# Patient Record
Sex: Male | Born: 1935 | Race: White | Hispanic: No | Marital: Married | Smoking: Former smoker
Health system: Southern US, Community
[De-identification: ages and names within clinical notes are randomized; demographics above are authoritative.]

## PROBLEM LIST (undated history)

## (undated) ENCOUNTER — Emergency Department (HOSPITAL_COMMUNITY): Admission: EM | Payer: Medicare Other | Source: Home / Self Care

## (undated) DIAGNOSIS — T8859XA Other complications of anesthesia, initial encounter: Secondary | ICD-10-CM

## (undated) DIAGNOSIS — I5020 Unspecified systolic (congestive) heart failure: Secondary | ICD-10-CM

## (undated) DIAGNOSIS — I10 Essential (primary) hypertension: Secondary | ICD-10-CM

## (undated) DIAGNOSIS — E785 Hyperlipidemia, unspecified: Secondary | ICD-10-CM

## (undated) DIAGNOSIS — I4819 Other persistent atrial fibrillation: Secondary | ICD-10-CM

## (undated) DIAGNOSIS — K560 Paralytic ileus: Secondary | ICD-10-CM

## (undated) DIAGNOSIS — T4145XA Adverse effect of unspecified anesthetic, initial encounter: Secondary | ICD-10-CM

## (undated) DIAGNOSIS — I5022 Chronic systolic (congestive) heart failure: Secondary | ICD-10-CM

## (undated) DIAGNOSIS — K219 Gastro-esophageal reflux disease without esophagitis: Secondary | ICD-10-CM

## (undated) DIAGNOSIS — E871 Hypo-osmolality and hyponatremia: Secondary | ICD-10-CM

## (undated) DIAGNOSIS — Z889 Allergy status to unspecified drugs, medicaments and biological substances status: Secondary | ICD-10-CM

## (undated) DIAGNOSIS — N400 Enlarged prostate without lower urinary tract symptoms: Secondary | ICD-10-CM

## (undated) HISTORY — DX: Essential (primary) hypertension: I10

## (undated) HISTORY — DX: Chronic systolic (congestive) heart failure: I50.22

## (undated) HISTORY — DX: Hypo-osmolality and hyponatremia: E87.1

## (undated) HISTORY — PX: UMBILICAL HERNIA REPAIR: SHX196

## (undated) HISTORY — DX: Hyperlipidemia, unspecified: E78.5

## (undated) HISTORY — PX: OTHER SURGICAL HISTORY: SHX169

## (undated) HISTORY — DX: Other persistent atrial fibrillation: I48.19

## (undated) HISTORY — DX: Unspecified systolic (congestive) heart failure: I50.20

## (undated) HISTORY — PX: APPENDECTOMY: SHX54

---

## 2000-08-02 ENCOUNTER — Ambulatory Visit (HOSPITAL_COMMUNITY): Admission: RE | Admit: 2000-08-02 | Discharge: 2000-08-02 | Payer: Self-pay | Admitting: Unknown Physician Specialty

## 2000-08-02 ENCOUNTER — Encounter: Payer: Self-pay | Admitting: Unknown Physician Specialty

## 2006-05-23 ENCOUNTER — Other Ambulatory Visit: Admission: RE | Admit: 2006-05-23 | Discharge: 2006-05-23 | Payer: Self-pay | Admitting: General Surgery

## 2006-05-23 ENCOUNTER — Encounter (INDEPENDENT_AMBULATORY_CARE_PROVIDER_SITE_OTHER): Payer: Self-pay | Admitting: General Surgery

## 2006-07-09 ENCOUNTER — Ambulatory Visit (HOSPITAL_COMMUNITY): Admission: RE | Admit: 2006-07-09 | Discharge: 2006-07-09 | Payer: Self-pay | Admitting: Internal Medicine

## 2006-11-20 ENCOUNTER — Ambulatory Visit: Payer: Self-pay | Admitting: Orthopedic Surgery

## 2006-11-20 DIAGNOSIS — G609 Hereditary and idiopathic neuropathy, unspecified: Secondary | ICD-10-CM | POA: Insufficient documentation

## 2006-11-20 DIAGNOSIS — M549 Dorsalgia, unspecified: Secondary | ICD-10-CM | POA: Insufficient documentation

## 2007-02-12 ENCOUNTER — Ambulatory Visit (HOSPITAL_COMMUNITY): Admission: RE | Admit: 2007-02-12 | Discharge: 2007-02-12 | Payer: Self-pay | Admitting: Orthopedic Surgery

## 2007-02-14 ENCOUNTER — Encounter: Admission: RE | Admit: 2007-02-14 | Discharge: 2007-02-14 | Payer: Self-pay | Admitting: Orthopedic Surgery

## 2007-03-05 ENCOUNTER — Encounter: Admission: RE | Admit: 2007-03-05 | Discharge: 2007-03-05 | Payer: Self-pay | Admitting: Orthopedic Surgery

## 2007-08-07 ENCOUNTER — Other Ambulatory Visit: Admission: RE | Admit: 2007-08-07 | Discharge: 2007-08-07 | Payer: Self-pay | Admitting: General Surgery

## 2007-08-07 ENCOUNTER — Encounter (INDEPENDENT_AMBULATORY_CARE_PROVIDER_SITE_OTHER): Payer: Self-pay | Admitting: General Surgery

## 2008-05-26 ENCOUNTER — Ambulatory Visit (HOSPITAL_COMMUNITY): Admission: RE | Admit: 2008-05-26 | Discharge: 2008-05-26 | Payer: Self-pay | Admitting: Pulmonary Disease

## 2008-06-24 ENCOUNTER — Other Ambulatory Visit: Admission: RE | Admit: 2008-06-24 | Discharge: 2008-06-24 | Payer: Self-pay | Admitting: General Surgery

## 2009-03-15 ENCOUNTER — Other Ambulatory Visit: Admission: RE | Admit: 2009-03-15 | Discharge: 2009-03-15 | Payer: Self-pay | Admitting: General Surgery

## 2009-04-20 ENCOUNTER — Ambulatory Visit (HOSPITAL_COMMUNITY): Admission: RE | Admit: 2009-04-20 | Discharge: 2009-04-20 | Payer: Self-pay | Admitting: Pulmonary Disease

## 2010-05-17 ENCOUNTER — Encounter: Payer: Self-pay | Admitting: Cardiovascular Disease

## 2010-05-17 ENCOUNTER — Ambulatory Visit (INDEPENDENT_AMBULATORY_CARE_PROVIDER_SITE_OTHER): Payer: Medicare Other | Admitting: Cardiovascular Disease

## 2010-05-17 ENCOUNTER — Other Ambulatory Visit: Payer: Self-pay | Admitting: Cardiology

## 2010-05-17 ENCOUNTER — Encounter (INDEPENDENT_AMBULATORY_CARE_PROVIDER_SITE_OTHER): Payer: Medicare Other | Admitting: Cardiology

## 2010-05-17 VITALS — BP 134/60 | HR 45 | Ht 72.0 in | Wt 184.0 lb

## 2010-05-17 DIAGNOSIS — I701 Atherosclerosis of renal artery: Secondary | ICD-10-CM

## 2010-05-17 DIAGNOSIS — I10 Essential (primary) hypertension: Secondary | ICD-10-CM

## 2010-05-17 MED ORDER — HYDRALAZINE HCL 25 MG PO TABS: 25.0000 mg | ORAL_TABLET | Freq: Three times a day (TID) | ORAL | Status: DC

## 2010-05-17 NOTE — Patient Instructions (Signed)
Your physician has recommended you make the following change in your medication: START Hydralazine 25mg  take one tablet by mouth every 8 hours

## 2010-05-18 ENCOUNTER — Encounter: Payer: Self-pay | Admitting: Cardiovascular Disease

## 2010-06-01 ENCOUNTER — Encounter: Payer: Self-pay | Admitting: Cardiovascular Disease

## 2010-06-01 DIAGNOSIS — I10 Essential (primary) hypertension: Secondary | ICD-10-CM | POA: Insufficient documentation

## 2010-06-01 NOTE — Progress Notes (Signed)
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 He has been followed by [name] for the past few years and is being treated with multidrug therapy for hypertension. Despite the use of 4 agents, his BP continues to run above goal. His initial diagnosis of hypertension was after age 77. He has had a renal MRA demonstrating no significant renal artery stenosis. He currently is treated with an ARB, Clonidine, bisoprolol, and amlodipine. He notes clonidine has been effective in lowering his BP, but he would like to lower the dose or discontinue the drug because of dry mouth. He has been on a diuretic in the past and had some elevation of his creatinine with this.   Home BP's generally range in the systolic from [id number], depending on how far out he is from last clonidine dose.   Overall he feels well. 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 He is not engaged in regular physical exercise. He denies chest pain, dyspnea, edema, orthopnea, or PND. He denies palpitations, lightheadedness, or syncope. He has no history of cardiac disease, TIA, or stroke.  Outpatient Encounter Prescriptions as of 05/17/2010  Medication Sig Dispense Refill  . amLODipine (NORVASC) 10 MG tablet Take 10 mg by mouth daily.        Marland Kitchen aspirin 81 MG EC tablet Take 81 mg by mouth daily.        . Azilsartan Medoxomil (EDARBI) 80 MG TABS Take 80 mg by mouth daily.        . cloNIDine (CATAPRES) 0.2 MG tablet Take 0.2 mg by mouth every 8 (eight) hours.        . dutasteride (AVODART) 0.5 MG capsule Take 0.5 mg by mouth daily.        . Nebivolol HCl (BYSTOLIC) 20 MG TABS Take 20 mg by mouth daily.        . niacin (NIASPAN) 500 MG CR tablet Take 500 mg by mouth at bedtime.        . rosuvastatin (CRESTOR) 20 MG tablet Take 20 mg by mouth daily.        . hydrALAZINE (APRESOLINE) 25 MG tablet Take 1 tablet (25 mg total) by mouth 3 (three) times daily.  90 tablet  11    Review of patient's  allergies indicates no known allergies.  Past Medical History  Diagnosis Date  . Hypertension   . Hyperlipidemia     Past Surgical History  Procedure Date  . Appendectomy     approx 30 years ago/lwb  . Umbilical hernia repair     Approx 10 years ago/lwb  . Ureteral stone extraction     History   Social History  . Marital Status: Married    Spouse Name: N/A    Number of Children: N/A  . Years of Education: N/A   Occupational History  . Not on file.   Social History Main Topics  . Smoking status: Former Smoker    Types: Pipe  . Smokeless tobacco: Not on file  . Alcohol Use: Yes     occasional social drink/lwb  . Drug Use: No  . Sexually Active: Not on file   Other Topics Concern  . Not on file   Social History Narrative  . No narrative on file    No family history on file.  ROS: General: no fevers/chills/night sweats Eyes: no blurry vision, diplopia, or amaurosis ENT: no sore throat or hearing loss Resp: no cough, wheezing, or hemoptysis CV: no edema  or palpitations GI: no abdominal pain, nausea, vomiting, diarrhea, or constipation GU: no dysuria, frequency, or hematuria Skin: no rash Neuro: no headache, numbness, tingling, or weakness of extremities Musculoskeletal: no joint pain or swelling Heme: no bleeding, DVT, or easy bruising Endo: no polydipsia or polyuria  BP 134/60  Pulse 45  Ht 6' (1.829 m)  Wt 184 lb (83.462 kg)  BMI 24.95 kg/m2  PHYSICAL EXAM: Pt is alert and oriented, WD, WN, in no distress. HEENT: normal Neck: JVP normal. Carotid upstrokes normal without bruits. No thyromegaly. Lungs: equal expansion, clear bilaterally CV: Apex is discrete and nondisplaced, bradycardic and regular with a grade 2/6 systolic ejection murmur along the left sternal border Abd: soft, NT, +BS, no bruit, no hepatosplenomegaly Back: no CVA tenderness Ext: no C/C/E        Femoral pulses 2+= without bruits        DP/PT pulses intact and = Skin: warm and  dry without rash Neuro: CNII-XII intact             Strength intact = bilaterally  EKG:  Marked sinus bradycardia with first degree AV block, pr 216 ms. No significant ST-T changes  ASSESSMENT AND PLAN:

## 2010-06-01 NOTE — Assessment & Plan Note (Signed)
Dr [name] has essential HTN with suboptimal control on multidrug therapy.  A renal duplex scan was performed and demonstrates patent renal arteries bilaterally without significant stenoses. I don't think workup of other secondary causes of HTN is necessary at this point. We discussed various drug strategies, and decided to give a trial of hydralazine 25 mg three times daily. This could be titrated to 50 mg TID if well-tolerated. Another option would be to consider the addition of chlorthalidone, which comes as a combination pill with edarbi, but the dose would have to be changed to 40-12.5 or 40-25 mg as this is the max dose of the combination drug Edarbyclor.  Unlike HCTZ, chlorthalidone can be used in patient's with renal insufficiency. Another consideration would be to change bisoprolol to a nonselective beta blocker like labetolol which has less of a bradycardic effect. Will plan on adding hydralazine first, then considering other options based on his response.

## 2010-06-02 ENCOUNTER — Encounter (HOSPITAL_COMMUNITY): Payer: Medicare Other

## 2010-06-02 ENCOUNTER — Other Ambulatory Visit: Payer: Self-pay | Admitting: Anesthesiology

## 2010-06-02 LAB — BASIC METABOLIC PANEL
BUN: 33 mg/dL — ABNORMAL HIGH (ref 6–23)
Calcium: 9.7 mg/dL (ref 8.4–10.5)
GFR calc non Af Amer: 42 mL/min — ABNORMAL LOW (ref >60)
Glucose, Bld: 161 mg/dL — ABNORMAL HIGH (ref 70–99)
Potassium: 4.5 mEq/L (ref 3.5–5.1)

## 2010-06-02 LAB — HEMOGLOBIN AND HEMATOCRIT, BLOOD: Hemoglobin: 11.9 g/dL — ABNORMAL LOW (ref 13.0–17.0)

## 2010-06-08 ENCOUNTER — Ambulatory Visit (HOSPITAL_COMMUNITY)
Admission: RE | Admit: 2010-06-08 | Discharge: 2010-06-08 | Disposition: A | Discharge status: Home / Self Care | Payer: Medicare Other | Source: Ambulatory Visit | Attending: Ophthalmology | Admitting: Ophthalmology

## 2010-06-08 DIAGNOSIS — I10 Essential (primary) hypertension: Secondary | ICD-10-CM | POA: Insufficient documentation

## 2010-06-08 DIAGNOSIS — H2589 Other age-related cataract: Secondary | ICD-10-CM | POA: Insufficient documentation

## 2010-06-08 DIAGNOSIS — Z7982 Long term (current) use of aspirin: Secondary | ICD-10-CM | POA: Insufficient documentation

## 2010-06-08 DIAGNOSIS — Z79899 Other long term (current) drug therapy: Secondary | ICD-10-CM | POA: Insufficient documentation

## 2010-06-09 ENCOUNTER — Ambulatory Visit: Payer: Self-pay | Admitting: Cardiovascular Disease

## 2010-06-19 NOTE — Op Note (Signed)
  NAME:  Dominic Hodges, Dominic Hodges            ACCOUNT NO.:  [id number]  MEDICAL RECORD NO.:  [id number]           PATIENT TYPE:  O  LOCATION:  [facility]                          FACILITY:  [facility]  PHYSICIAN:  Richardo Hanks, MD       DATE OF BIRTH:  10/20/1927  DATE OF PROCEDURE:  06/08/2010 DATE OF DISCHARGE:  06/08/2010                              OPERATIVE REPORT   PREOPERATIVE DIAGNOSIS:  Combined cataract right eye, diagnosis code 366.19.  POSTOPERATIVE DIAGNOSIS:  Combined cataract right eye, diagnosis code 366.19.  OPERATION PERFORMED:  Phacoemulsification with posterior chamber intraocular lens implantation, right eye.  SURGEON:  Richardo Hanks, MD  ANESTHESIA:  General endotracheal anesthesia.  OPERATIVE SUMMARY:  In the preoperative area, dilating drops were placed into the right eye.  The patient was then brought into the operating room where she was placed under general anesthesia.  The eye was then prepped and draped.  Beginning with a 75 blade, a paracentesis port was made at the surgeon's 2 o'clock position.  The anterior chamber was then filled with a 1% nonpreserved lidocaine solution with epinephrine.  This was followed by Viscoat to deepen the chamber.  A small fornix-based peritomy was performed superiorly.  Next, a single iris hook was placed through the limbus superiorly.  A 2.4-mm keratome blade was then used to make a clear corneal incision over the iris hook.  A bent cystotome needle and Utrata forceps were used to create a continuous tear capsulotomy.  Hydrodissection was performed using balanced salt solution on a fine cannula.  The lens nucleus was then removed using phacoemulsification in a quadrant cracking technique.  The cortical material was then removed with irrigation and aspiration.  The capsular bag and anterior chamber were refilled with Provisc.  The wound was widened to approximately 3 mm and a posterior chamber intraocular lens was placed into the  capsular bag without difficulty using an [employer] lens injecting system.  A single 10-0 nylon suture was then used to close the incision as well as stromal hydration.  The Provisc was removed from the anterior chamber and capsular bag with irrigation and aspiration.  At this point, the wounds were tested for leak, which were negative.  The anterior chamber remained deep and stable.  The patient tolerated the procedure well.  There were no operative complications, and she awoke from general anesthesia without problem.  PROSTHETIC DEVICE USED:  Alcon ReSTOR posterior chamber lens, model SN6AD1, power of 20.0, serial number is [id number].          ______________________________ Richardo Hanks, MD     KEH/MEDQ  D:  06/08/2010  T:  06/08/2010  Job:  [id number]  Electronically Signed by Tonny Branch MD on 06/19/2010 12:22:10 PM

## 2010-10-04 ENCOUNTER — Other Ambulatory Visit: Payer: Self-pay | Admitting: General Surgery

## 2010-10-04 ENCOUNTER — Other Ambulatory Visit (HOSPITAL_COMMUNITY)
Admission: RE | Admit: 2010-10-04 | Discharge: 2010-10-04 | Disposition: A | Discharge status: Home / Self Care | Payer: Medicare Other | Source: Ambulatory Visit | Attending: General Surgery | Admitting: General Surgery

## 2010-10-04 DIAGNOSIS — D0420 Carcinoma in situ of skin of unspecified ear and external auricular canal: Secondary | ICD-10-CM | POA: Insufficient documentation

## 2011-10-01 ENCOUNTER — Other Ambulatory Visit (HOSPITAL_COMMUNITY)
Admission: RE | Admit: 2011-10-01 | Discharge: 2011-10-01 | Disposition: A | Discharge status: Home / Self Care | Payer: Medicare Other | Source: Ambulatory Visit | Attending: General Surgery | Admitting: General Surgery

## 2011-10-01 DIAGNOSIS — C44621 Squamous cell carcinoma of skin of unspecified upper limb, including shoulder: Secondary | ICD-10-CM | POA: Insufficient documentation

## 2011-10-31 ENCOUNTER — Other Ambulatory Visit (HOSPITAL_COMMUNITY)
Admission: RE | Admit: 2011-10-31 | Discharge: 2011-10-31 | Disposition: A | Discharge status: Home / Self Care | Payer: Medicare Other | Source: Ambulatory Visit | Attending: General Surgery | Admitting: General Surgery

## 2011-10-31 DIAGNOSIS — C44221 Squamous cell carcinoma of skin of unspecified ear and external auricular canal: Secondary | ICD-10-CM | POA: Insufficient documentation

## 2012-09-03 ENCOUNTER — Encounter (HOSPITAL_COMMUNITY): Payer: Self-pay | Admitting: Pharmacy Technician

## 2012-09-09 ENCOUNTER — Other Ambulatory Visit: Payer: Self-pay

## 2012-09-09 ENCOUNTER — Encounter (HOSPITAL_COMMUNITY): Payer: Self-pay

## 2012-09-09 ENCOUNTER — Encounter (HOSPITAL_COMMUNITY)
Admission: RE | Admit: 2012-09-09 | Discharge: 2012-09-09 | Disposition: A | Discharge status: Home / Self Care | Payer: Medicare Other | Source: Ambulatory Visit | Attending: Ophthalmology | Admitting: Ophthalmology

## 2012-09-09 HISTORY — DX: Benign prostatic hyperplasia without lower urinary tract symptoms: N40.0

## 2012-09-09 LAB — BASIC METABOLIC PANEL
CO2: 25 mEq/L (ref 19–32)
Calcium: 9.3 mg/dL (ref 8.4–10.5)
Glucose, Bld: 99 mg/dL (ref 70–99)
Sodium: 138 mEq/L (ref 135–145)

## 2012-09-09 NOTE — Patient Instructions (Signed)
Your procedure is scheduled on:  09/11/12  Report to [facility] at 06:15 AM.  Call this number if you have problems the morning of surgery: [phone]   Remember:   Do not eat or drink:After Midnight.  Take these medicines the morning of surgery with A SIP OF WATER: Edarbi, Clonidine, Hydralazine and Bystolic.   Do not wear jewelry, make-up or nail polish.  Do not wear lotions, powders, or perfumes. You may wear deodorant.  Do not shave 48 hours prior to surgery. Men may shave face and neck.  Do not bring valuables to the hospital.  Contacts, dentures or bridgework may not be worn into surgery.  Leave suitcase in the car. After surgery it may be brought to your room.  For patients admitted to the hospital, checkout time is 11:00 AM the day of discharge.   Patients discharged the day of surgery will not be allowed to drive home.    Special Instructions: Start using your eye drops before surgery as directed by your eye doctor.   Please read over the following fact sheets that you were given: Anesthesia Post-op Instructions    Cataract Surgery  A cataract is a clouding of the lens of the eye. When a lens becomes cloudy, vision is reduced based on the degree and nature of the clouding. Surgery may be needed to improve vision. Surgery removes the cloudy lens and usually replaces it with a substitute lens (intraocular lens, IOL). LET YOUR EYE DOCTOR KNOW ABOUT:  Allergies to food or medicine.  Medicines taken including herbs, eyedrops, over-the-counter medicines, and creams.  Use of steroids (by mouth or creams).  Previous problems with anesthetics or numbing medicine.  History of bleeding problems or blood clots.  Previous surgery.  Other health problems, including diabetes and kidney problems.  Possibility of pregnancy, if this applies. RISKS AND COMPLICATIONS  Infection.  Inflammation of the eyeball (endophthalmitis) that can spread to both eyes (sympathetic  ophthalmia).  Poor wound healing.  If an IOL is inserted, it can later fall out of proper position. This is very uncommon.  Clouding of the part of your eye that holds an IOL in place. This is called an "after-cataract." These are uncommon, but easily treated. BEFORE THE PROCEDURE  Do not eat or drink anything except small amounts of water for 8 to 12 before your surgery, or as directed by your caregiver.  Unless you are told otherwise, continue any eyedrops you have been prescribed.  Talk to your primary caregiver about all other medicines that you take (both prescription and non-prescription). In some cases, you may need to stop or change medicines near the time of your surgery. This is most important if you are taking blood-thinning medicine.Do not stop medicines unless you are told to do so.  Arrange for someone to drive you to and from the procedure.  Do not put contact lenses in either eye on the day of your surgery. PROCEDURE There is more than one method for safely removing a cataract. Your doctor can explain the differences and help determine which is best for you. Phacoemulsification surgery is the most common form of cataract surgery.  An injection is given behind the eye or eyedrops are given to make this a painless procedure.  A small cut (incision) is made on the edge of the clear, dome-shaped surface that covers the front of the eye (cornea).  A tiny probe is painlessly inserted into the eye. This device gives off ultrasound waves that soften and  break up the cloudy center of the lens. This makes it easier for the cloudy lens to be removed by suction.  An IOL may be implanted.  The normal lens of the eye is covered by a clear capsule. Part of that capsule is intentionally left in the eye to support the IOL.  Your surgeon may or may not use stitches to close the incision. There are other forms of cataract surgery that require a larger incision and stiches to close the  eye. This approach is taken in cases where the doctor feels that the cataract cannot be easily removed using phacoemulsification. AFTER THE PROCEDURE  When an IOL is implanted, it does not need care. It becomes a permanent part of your eye and cannot be seen or felt.  Your doctor will schedule follow-up exams to check on your progress.  Review your other medicines with your doctor to see which can be resumed after surgery.  Use eyedrops or take medicine as prescribed by your doctor. Document Released: 12/14/2010 Document Revised: 03/19/2011 Document Reviewed: 12/14/2010 [facility] Patient Information 2013 [place].    PATIENT INSTRUCTIONS POST-ANESTHESIA  IMMEDIATELY FOLLOWING SURGERY:  Do not drive or operate machinery for the first twenty four hours after surgery.  Do not make any important decisions for twenty four hours after surgery or while taking narcotic pain medications or sedatives.  If you develop intractable nausea and vomiting or a severe headache please notify your doctor immediately.  FOLLOW-UP:  Please make an appointment with your surgeon as instructed. You do not need to follow up with anesthesia unless specifically instructed to do so.  WOUND CARE INSTRUCTIONS (if applicable):  Keep a dry clean dressing on the anesthesia/puncture wound site if there is drainage.  Once the wound has quit draining you may leave it open to air.  Generally you should leave the bandage intact for twenty four hours unless there is drainage.  If the epidural site drains for more than 36-48 hours please call the anesthesia department.  QUESTIONS?:  Please feel free to call your physician or the hospital operator if you have any questions, and they will be happy to assist you.

## 2012-09-10 MED ORDER — LIDOCAINE HCL 3.5 % OP GEL
OPHTHALMIC | Status: AC
  Filled 2012-09-10: qty 5

## 2012-09-10 MED ORDER — NEOMYCIN-POLYMYXIN-DEXAMETH 3.5-10000-0.1 OP OINT
TOPICAL_OINTMENT | OPHTHALMIC | Status: AC
  Filled 2012-09-10: qty 3.5

## 2012-09-10 MED ORDER — CYCLOPENTOLATE-PHENYLEPHRINE OP SOLN OPTIME - NO CHARGE
OPHTHALMIC | Status: AC
  Filled 2012-09-10: qty 2

## 2012-09-10 MED ORDER — PHENYLEPHRINE HCL 2.5 % OP SOLN
OPHTHALMIC | Status: AC
  Filled 2012-09-10: qty 15

## 2012-09-10 MED ORDER — LIDOCAINE HCL (PF) 1 % IJ SOLN
INTRAMUSCULAR | Status: AC
  Filled 2012-09-10: qty 2

## 2012-09-10 MED ORDER — TETRACAINE HCL 0.5 % OP SOLN
OPHTHALMIC | Status: AC
  Filled 2012-09-10: qty 2

## 2012-09-11 ENCOUNTER — Encounter (HOSPITAL_COMMUNITY): Payer: Self-pay | Admitting: *Deleted

## 2012-09-11 ENCOUNTER — Ambulatory Visit (HOSPITAL_COMMUNITY)
Admission: RE | Admit: 2012-09-11 | Discharge: 2012-09-11 | Disposition: A | Discharge status: Home / Self Care | Payer: Medicare Other | Source: Ambulatory Visit | Attending: Ophthalmology | Admitting: Ophthalmology

## 2012-09-11 ENCOUNTER — Ambulatory Visit (HOSPITAL_COMMUNITY): Payer: Medicare Other | Admitting: Anesthesiology

## 2012-09-11 ENCOUNTER — Encounter (HOSPITAL_COMMUNITY): Payer: Self-pay | Admitting: Anesthesiology

## 2012-09-11 ENCOUNTER — Encounter (HOSPITAL_COMMUNITY)
Admission: RE | Disposition: A | Discharge status: Home / Self Care | Payer: Self-pay | Source: Ambulatory Visit | Attending: Ophthalmology

## 2012-09-11 DIAGNOSIS — Z01810 Encounter for preprocedural cardiovascular examination: Secondary | ICD-10-CM | POA: Insufficient documentation

## 2012-09-11 DIAGNOSIS — Z01812 Encounter for preprocedural laboratory examination: Secondary | ICD-10-CM | POA: Insufficient documentation

## 2012-09-11 DIAGNOSIS — I10 Essential (primary) hypertension: Secondary | ICD-10-CM | POA: Insufficient documentation

## 2012-09-11 DIAGNOSIS — H2589 Other age-related cataract: Secondary | ICD-10-CM | POA: Insufficient documentation

## 2012-09-11 DIAGNOSIS — H52209 Unspecified astigmatism, unspecified eye: Secondary | ICD-10-CM | POA: Insufficient documentation

## 2012-09-11 HISTORY — PX: CATARACT EXTRACTION W/PHACO: SHX586

## 2012-09-11 SURGERY — PHACOEMULSIFICATION, CATARACT, WITH IOL INSERTION
Anesthesia: Monitor Anesthesia Care | Site: Eye | Laterality: Left | Wound class: Clean

## 2012-09-11 MED ORDER — MIDAZOLAM HCL 2 MG/2ML IJ SOLN
INTRAMUSCULAR | Status: AC
  Filled 2012-09-11: qty 2

## 2012-09-11 MED ORDER — BSS IO SOLN
INTRAOCULAR | Status: DC | PRN
  Administered 2012-09-11: 15 mL via INTRAOCULAR

## 2012-09-11 MED ORDER — EPINEPHRINE HCL 1 MG/ML IJ SOLN
INTRAMUSCULAR | Status: AC
  Filled 2012-09-11: qty 1

## 2012-09-11 MED ORDER — LACTATED RINGERS IV SOLN
INTRAVENOUS | Status: DC
  Administered 2012-09-11: 07:00:00 via INTRAVENOUS

## 2012-09-11 MED ORDER — TETRACAINE HCL 0.5 % OP SOLN
1.0000 [drp] | OPHTHALMIC | Status: AC
  Administered 2012-09-11 (×3): 1 [drp] via OPHTHALMIC

## 2012-09-11 MED ORDER — PROVISC 10 MG/ML IO SOLN
INTRAOCULAR | Status: DC | PRN
  Administered 2012-09-11: 8.5 mg via INTRAOCULAR

## 2012-09-11 MED ORDER — MIDAZOLAM HCL 2 MG/2ML IJ SOLN
1.0000 mg | INTRAMUSCULAR | Status: DC | PRN
  Administered 2012-09-11: 2 mg via INTRAVENOUS

## 2012-09-11 MED ORDER — POVIDONE-IODINE 5 % OP SOLN
OPHTHALMIC | Status: DC | PRN
  Administered 2012-09-11: 1 via OPHTHALMIC

## 2012-09-11 MED ORDER — LIDOCAINE HCL 3.5 % OP GEL: 1.0000 "application " | Freq: Once | OPHTHALMIC | Status: DC

## 2012-09-11 MED ORDER — FENTANYL CITRATE 0.05 MG/ML IJ SOLN
INTRAMUSCULAR | Status: AC
  Filled 2012-09-11: qty 2

## 2012-09-11 MED ORDER — LIDOCAINE HCL (PF) 1 % IJ SOLN
INTRAOCULAR | Status: DC | PRN
  Administered 2012-09-11: 08:00:00 via OPHTHALMIC

## 2012-09-11 MED ORDER — FENTANYL CITRATE 0.05 MG/ML IJ SOLN
25.0000 ug | INTRAMUSCULAR | Status: AC
  Administered 2012-09-11: 25 ug via INTRAVENOUS

## 2012-09-11 MED ORDER — NEOMYCIN-POLYMYXIN-DEXAMETH 0.1 % OP OINT
TOPICAL_OINTMENT | OPHTHALMIC | Status: DC | PRN
  Administered 2012-09-11: 1 via OPHTHALMIC

## 2012-09-11 MED ORDER — PHENYLEPHRINE HCL 2.5 % OP SOLN
1.0000 [drp] | OPHTHALMIC | Status: AC
Start: 2012-09-11 — End: 2012-09-11
  Administered 2012-09-11 (×3): 1 [drp] via OPHTHALMIC

## 2012-09-11 MED ORDER — LIDOCAINE 3.5 % OP GEL OPTIME - NO CHARGE
OPHTHALMIC | Status: DC | PRN
  Administered 2012-09-11: 1 [drp] via OPHTHALMIC

## 2012-09-11 MED ORDER — EPINEPHRINE HCL 1 MG/ML IJ SOLN
INTRAOCULAR | Status: DC | PRN
  Administered 2012-09-11: 08:00:00

## 2012-09-11 MED ORDER — CYCLOPENTOLATE-PHENYLEPHRINE 0.2-1 % OP SOLN
1.0000 [drp] | OPHTHALMIC | Status: AC
  Administered 2012-09-11 (×3): 1 [drp] via OPHTHALMIC

## 2012-09-11 SURGICAL SUPPLY — 33 items
ATOMIC EDGE ACCURATE DEPTH KNIFE ×1 IMPLANT
CAPSULAR TENSION RING-AMO (OPHTHALMIC RELATED) IMPLANT
CLOTH BEACON ORANGE TIMEOUT ST (SAFETY) ×1 IMPLANT
EYE SHIELD UNIVERSAL CLEAR (GAUZE/BANDAGES/DRESSINGS) ×1 IMPLANT
GLOVE BIO SURGEON STRL SZ 6.5 (GLOVE) IMPLANT
GLOVE BIOGEL PI IND STRL 6.5 (GLOVE) IMPLANT
GLOVE BIOGEL PI IND STRL 7.0 (GLOVE) IMPLANT
GLOVE BIOGEL PI IND STRL 7.5 (GLOVE) IMPLANT
GLOVE BIOGEL PI INDICATOR 6.5 (GLOVE) ×2
GLOVE BIOGEL PI INDICATOR 7.0 (GLOVE)
GLOVE BIOGEL PI INDICATOR 7.5 (GLOVE)
GLOVE ECLIPSE 6.5 STRL STRAW (GLOVE) IMPLANT
GLOVE ECLIPSE 7.0 STRL STRAW (GLOVE) IMPLANT
GLOVE ECLIPSE 7.5 STRL STRAW (GLOVE) IMPLANT
GLOVE EXAM NITRILE LRG STRL (GLOVE) IMPLANT
GLOVE EXAM NITRILE MD LF STRL (GLOVE) IMPLANT
GLOVE SKINSENSE NS SZ6.5 (GLOVE)
GLOVE SKINSENSE NS SZ7.0 (GLOVE)
GLOVE SKINSENSE STRL SZ6.5 (GLOVE) IMPLANT
GLOVE SKINSENSE STRL SZ7.0 (GLOVE) IMPLANT
KIT VITRECTOMY (OPHTHALMIC RELATED) IMPLANT
LENS INTRAOCULAR RESTOR ×1 IMPLANT
PAD ARMBOARD 7.5X6 YLW CONV (MISCELLANEOUS) ×1 IMPLANT
PROC W NO LENS (INTRAOCULAR LENS)
PROC W SPEC LENS (INTRAOCULAR LENS) ×2
PROCESS W NO LENS (INTRAOCULAR LENS) IMPLANT
PROCESS W SPEC LENS (INTRAOCULAR LENS) IMPLANT
RING MALYGIN (MISCELLANEOUS) IMPLANT
SYR TB 1ML LL NO SAFETY (SYRINGE) ×1 IMPLANT
TAPE SURG TRANSPORE 1 IN (GAUZE/BANDAGES/DRESSINGS) IMPLANT
TAPE SURGICAL TRANSPORE 1 IN (GAUZE/BANDAGES/DRESSINGS) ×1
VISCOELASTIC ADDITIONAL (OPHTHALMIC RELATED) IMPLANT
WATER STERILE IRR 250ML POUR (IV SOLUTION) ×1 IMPLANT

## 2012-09-11 NOTE — Op Note (Signed)
Date of Admission: 09/11/2012  Date of Surgery: 09/11/2012  Pre-Op Dx: Cataract, Astigmatism, Left  Eye  Post-Op Dx: Cataract  Left  Eye,  Dx Code 366.19, Astigmatism Left Eye, Dx Code [id number]  Surgeon: Tonny Branch, M.D.  Assistants: None  Anesthesia: Topical with MAC  Indications: Painless, progressive loss of vision with compromise of daily activities.  Surgery: Cataract Extraction with Intraocular lens Implant, Limbal relaxing Incision, Left Eye  Discription: The patient had dilating drops and viscous lidocaine placed into the left eye in the pre-op holding area. After transfer to the operating room, a time out was performed. The patient was then prepped and draped. Beginning with a 32 degree blade a paracentesis port was made at the surgeon's 2 o'clock position. The anterior chamber was then filled with 1% non-preserved lidocaine. This was followed by filling the anterior chamber with Provisc. A 2.69mm keratome blade was used to make a clear corneal incision at the temporal limbus. A bent cystatome needle was used to create a continuous tear capsulotomy. Hydrodissection was performed with balanced salt solution on a Fine canula. The lens nucleus was then removed using the phacoemulsification handpiece. Residual cortex was removed with the I&A handpiece. The anterior chamber and capsular bag were refilled with Provisc. A posterior chamber intraocular lens was placed into the capsular bag with it's injector. The implant was positioned with the Kuglan hook. Marks for the LRI were placed on the 113 and 189 degree meridians. A 550um depth knife was used to make the LRI. The Provisc was then removed from the anterior chamber and capsular bag with the I&A handpiece. Stromal hydration of the main incision and paracentesis port was performed with BSS on a Fine canula. The wounds were tested for leak which was negative. The patient tolerated the procedure well. There were no operative complications. The patient  was then transferred to the recovery room in stable condition.  Complications: None  Specimen: None  EBL: None  Prosthetic device: Alcon AcrySof ReStor, SN6AD1, power 20.5, SN [id number].

## 2012-09-11 NOTE — H&P (Signed)
I have reviewed the H&P, the patient was re-examined, and I have identified no interval changes in medical condition and plan of care since the history and physical of record  

## 2012-09-11 NOTE — Anesthesia Preprocedure Evaluation (Signed)
Anesthesia Evaluation  Patient identified by MRN, date of birth, ID band Patient awake    Reviewed: Allergy & Precautions, H&P , NPO status , Patient's Chart, lab work & pertinent test results, reviewed documented beta blocker date and time   History of Anesthesia Complications Negative for: history of anesthetic complications  Airway Mallampati: II TM Distance: >3 FB     Dental  (+) Teeth Intact and Implants   Pulmonary neg pulmonary ROS,  breath sounds clear to auscultation        Cardiovascular hypertension, Pt. on medications Rhythm:Regular Rate:Normal     Neuro/Psych  Neuromuscular disease    GI/Hepatic negative GI ROS,   Endo/Other    Renal/GU      Musculoskeletal   Abdominal   Peds  Hematology   Anesthesia Other Findings   Reproductive/Obstetrics                           Anesthesia Physical Anesthesia Plan  ASA: II  Anesthesia Plan: MAC   Post-op Pain Management:    Induction:   Airway Management Planned: Nasal Cannula  Additional Equipment:   Intra-op Plan:   Post-operative Plan:   Informed Consent: I have reviewed the patients History and Physical, chart, labs and discussed the procedure including the risks, benefits and alternatives for the proposed anesthesia with the patient or authorized representative who has indicated his/her understanding and acceptance.     Plan Discussed with:   Anesthesia Plan Comments:         Anesthesia Quick Evaluation

## 2012-09-11 NOTE — Transfer of Care (Signed)
Immediate Anesthesia Transfer of Care Note  Patient: Dominic Hodges  Procedure(s) Performed: Procedure(s) with comments: CATARACT EXTRACTION PHACO AND INTRAOCULAR LENS PLACEMENT (IOC) (Left) - CDE: 19.52  Patient Location: PACU and Short Stay  Anesthesia Type:MAC  Level of Consciousness: awake  Airway & Oxygen Therapy: Patient Spontanous Breathing  Post-op Assessment: Report given to PACU RN  Post vital signs: Reviewed  Complications: No apparent anesthesia complications

## 2012-09-11 NOTE — Anesthesia Postprocedure Evaluation (Signed)
  Anesthesia Post-op Note  Patient: Dominic Hodges  Procedure(s) Performed: Procedure(s) with comments: CATARACT EXTRACTION PHACO AND INTRAOCULAR LENS PLACEMENT (IOC) (Left) - CDE: 19.52  Patient Location: PACU and Short Stay  Anesthesia Type:MAC  Level of Consciousness: awake, alert  and oriented  Airway and Oxygen Therapy: Patient Spontanous Breathing  Post-op Pain: none  Post-op Assessment: Post-op Vital signs reviewed, Patient's Cardiovascular Status Stable, Respiratory Function Stable, Patent Airway and No signs of Nausea or vomiting  Post-op Vital Signs: Reviewed and stable  Complications: No apparent anesthesia complications

## 2012-09-12 ENCOUNTER — Encounter (HOSPITAL_COMMUNITY): Payer: Self-pay | Admitting: Ophthalmology

## 2013-01-29 ENCOUNTER — Other Ambulatory Visit (HOSPITAL_COMMUNITY): Payer: Self-pay | Admitting: Pulmonary Disease

## 2013-01-29 DIAGNOSIS — I714 Abdominal aortic aneurysm, without rupture, unspecified: Secondary | ICD-10-CM

## 2013-01-29 DIAGNOSIS — Z136 Encounter for screening for cardiovascular disorders: Secondary | ICD-10-CM

## 2013-01-29 DIAGNOSIS — I10 Essential (primary) hypertension: Secondary | ICD-10-CM

## 2013-01-29 DIAGNOSIS — Z139 Encounter for screening, unspecified: Secondary | ICD-10-CM

## 2013-01-31 DIAGNOSIS — R6889 Other general symptoms and signs: Secondary | ICD-10-CM | POA: Diagnosis not present

## 2013-01-31 DIAGNOSIS — Z79899 Other long term (current) drug therapy: Secondary | ICD-10-CM | POA: Diagnosis not present

## 2013-01-31 DIAGNOSIS — IMO0001 Reserved for inherently not codable concepts without codable children: Secondary | ICD-10-CM | POA: Diagnosis not present

## 2013-01-31 DIAGNOSIS — I10 Essential (primary) hypertension: Secondary | ICD-10-CM | POA: Diagnosis not present

## 2013-01-31 DIAGNOSIS — Z0000 Encounter for general adult medical examination without abnormal findings: Secondary | ICD-10-CM | POA: Diagnosis not present

## 2013-01-31 DIAGNOSIS — E785 Hyperlipidemia, unspecified: Secondary | ICD-10-CM | POA: Diagnosis not present

## 2013-01-31 DIAGNOSIS — Z125 Encounter for screening for malignant neoplasm of prostate: Secondary | ICD-10-CM | POA: Diagnosis not present

## 2013-02-04 ENCOUNTER — Ambulatory Visit (HOSPITAL_COMMUNITY): Payer: Medicare Other

## 2013-02-04 ENCOUNTER — Other Ambulatory Visit (HOSPITAL_COMMUNITY): Payer: Self-pay | Admitting: Pulmonary Disease

## 2013-02-04 ENCOUNTER — Ambulatory Visit (HOSPITAL_COMMUNITY)
Admission: RE | Admit: 2013-02-04 | Discharge: 2013-02-04 | Disposition: A | Discharge status: Home / Self Care | Payer: Medicare Other | Source: Ambulatory Visit | Attending: Pulmonary Disease | Admitting: Pulmonary Disease

## 2013-02-04 DIAGNOSIS — I714 Abdominal aortic aneurysm, without rupture, unspecified: Secondary | ICD-10-CM | POA: Diagnosis not present

## 2013-02-04 DIAGNOSIS — Z87891 Personal history of nicotine dependence: Secondary | ICD-10-CM | POA: Insufficient documentation

## 2013-02-04 DIAGNOSIS — I10 Essential (primary) hypertension: Secondary | ICD-10-CM

## 2013-02-04 DIAGNOSIS — I6529 Occlusion and stenosis of unspecified carotid artery: Secondary | ICD-10-CM | POA: Insufficient documentation

## 2013-02-04 DIAGNOSIS — I658 Occlusion and stenosis of other precerebral arteries: Secondary | ICD-10-CM | POA: Diagnosis not present

## 2013-04-30 ENCOUNTER — Other Ambulatory Visit (HOSPITAL_COMMUNITY): Payer: Self-pay | Admitting: Pulmonary Disease

## 2013-04-30 ENCOUNTER — Ambulatory Visit (HOSPITAL_COMMUNITY)
Admission: RE | Admit: 2013-04-30 | Discharge: 2013-04-30 | Disposition: A | Discharge status: Home / Self Care | Payer: Medicare Other | Source: Ambulatory Visit | Attending: Pulmonary Disease | Admitting: Pulmonary Disease

## 2013-04-30 DIAGNOSIS — M25559 Pain in unspecified hip: Secondary | ICD-10-CM | POA: Diagnosis not present

## 2013-05-27 DIAGNOSIS — L600 Ingrowing nail: Secondary | ICD-10-CM | POA: Diagnosis not present

## 2013-06-24 ENCOUNTER — Other Ambulatory Visit (HOSPITAL_COMMUNITY)
Admission: RE | Admit: 2013-06-24 | Discharge: 2013-06-24 | Disposition: A | Discharge status: Home / Self Care | Payer: Medicare Other | Source: Ambulatory Visit | Attending: General Surgery | Admitting: General Surgery

## 2013-06-24 DIAGNOSIS — L723 Sebaceous cyst: Secondary | ICD-10-CM | POA: Insufficient documentation

## 2013-06-24 DIAGNOSIS — K6289 Other specified diseases of anus and rectum: Secondary | ICD-10-CM | POA: Diagnosis not present

## 2013-06-24 DIAGNOSIS — L089 Local infection of the skin and subcutaneous tissue, unspecified: Secondary | ICD-10-CM | POA: Diagnosis not present

## 2013-08-17 ENCOUNTER — Other Ambulatory Visit (HOSPITAL_COMMUNITY): Payer: Self-pay | Admitting: Pulmonary Disease

## 2013-08-17 ENCOUNTER — Ambulatory Visit (HOSPITAL_COMMUNITY)
Admission: RE | Admit: 2013-08-17 | Discharge: 2013-08-17 | Disposition: A | Discharge status: Home / Self Care | Payer: Medicare Other | Source: Ambulatory Visit | Attending: Pulmonary Disease | Admitting: Pulmonary Disease

## 2013-08-17 DIAGNOSIS — M25562 Pain in left knee: Secondary | ICD-10-CM

## 2013-08-17 DIAGNOSIS — M25569 Pain in unspecified knee: Secondary | ICD-10-CM | POA: Insufficient documentation

## 2013-08-18 ENCOUNTER — Ambulatory Visit (HOSPITAL_COMMUNITY)
Admission: RE | Admit: 2013-08-18 | Discharge: 2013-08-18 | Disposition: A | Discharge status: Home / Self Care | Payer: Medicare Other | Source: Ambulatory Visit | Attending: Pulmonary Disease | Admitting: Pulmonary Disease

## 2013-08-18 ENCOUNTER — Other Ambulatory Visit (HOSPITAL_COMMUNITY): Payer: Self-pay | Admitting: Pulmonary Disease

## 2013-08-18 DIAGNOSIS — S83289A Other tear of lateral meniscus, current injury, unspecified knee, initial encounter: Secondary | ICD-10-CM | POA: Diagnosis not present

## 2013-08-18 DIAGNOSIS — M2240 Chondromalacia patellae, unspecified knee: Secondary | ICD-10-CM | POA: Diagnosis not present

## 2013-08-18 DIAGNOSIS — M25469 Effusion, unspecified knee: Secondary | ICD-10-CM | POA: Diagnosis not present

## 2013-08-18 DIAGNOSIS — IMO0002 Reserved for concepts with insufficient information to code with codable children: Secondary | ICD-10-CM | POA: Diagnosis not present

## 2013-08-18 DIAGNOSIS — M25562 Pain in left knee: Secondary | ICD-10-CM

## 2013-08-18 DIAGNOSIS — X58XXXA Exposure to other specified factors, initial encounter: Secondary | ICD-10-CM | POA: Diagnosis not present

## 2013-08-18 DIAGNOSIS — M25569 Pain in unspecified knee: Secondary | ICD-10-CM | POA: Insufficient documentation

## 2013-08-18 DIAGNOSIS — M23359 Other meniscus derangements, posterior horn of lateral meniscus, unspecified knee: Secondary | ICD-10-CM | POA: Diagnosis not present

## 2013-08-18 DIAGNOSIS — M23329 Other meniscus derangements, posterior horn of medial meniscus, unspecified knee: Secondary | ICD-10-CM | POA: Diagnosis not present

## 2013-08-20 ENCOUNTER — Other Ambulatory Visit (HOSPITAL_COMMUNITY): Payer: Medicare Other

## 2014-02-03 DIAGNOSIS — L989 Disorder of the skin and subcutaneous tissue, unspecified: Secondary | ICD-10-CM | POA: Diagnosis not present

## 2014-06-28 ENCOUNTER — Telehealth (INDEPENDENT_AMBULATORY_CARE_PROVIDER_SITE_OTHER): Payer: Self-pay | Admitting: Internal Medicine

## 2014-06-28 MED ORDER — SUCRALFATE 1 GM/10ML PO SUSP: 1.0000 g | Freq: Four times a day (QID) | ORAL | Status: DC

## 2014-06-28 MED ORDER — PANTOPRAZOLE SODIUM 40 MG PO TBEC: 40.0000 mg | DELAYED_RELEASE_TABLET | Freq: Every day | ORAL | Status: DC

## 2014-06-28 NOTE — Telephone Encounter (Signed)
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 He has developed pill esophagitis. Will treat him with sucralfate 1 g by mouth before meals and daily at bedtime for 2 weeks and pantoprazole 40 mg by mouth daily. If he does not feel better by end of week would consider EGD.

## 2014-07-17 ENCOUNTER — Ambulatory Visit (HOSPITAL_COMMUNITY)
Admission: RE | Admit: 2014-07-17 | Discharge: 2014-07-17 | Disposition: A | Discharge status: Home / Self Care | Payer: Medicare Other | Source: Ambulatory Visit | Attending: Pulmonary Disease | Admitting: Pulmonary Disease

## 2014-07-17 ENCOUNTER — Other Ambulatory Visit (HOSPITAL_COMMUNITY): Payer: Self-pay | Admitting: Pulmonary Disease

## 2014-07-17 DIAGNOSIS — R05 Cough: Secondary | ICD-10-CM | POA: Diagnosis not present

## 2014-07-17 DIAGNOSIS — R059 Cough, unspecified: Secondary | ICD-10-CM

## 2014-07-20 DIAGNOSIS — R634 Abnormal weight loss: Secondary | ICD-10-CM | POA: Diagnosis not present

## 2014-07-20 DIAGNOSIS — I10 Essential (primary) hypertension: Secondary | ICD-10-CM | POA: Diagnosis not present

## 2014-07-20 DIAGNOSIS — E785 Hyperlipidemia, unspecified: Secondary | ICD-10-CM | POA: Diagnosis not present

## 2014-07-20 DIAGNOSIS — N401 Enlarged prostate with lower urinary tract symptoms: Secondary | ICD-10-CM | POA: Diagnosis not present

## 2014-07-21 DIAGNOSIS — E785 Hyperlipidemia, unspecified: Secondary | ICD-10-CM | POA: Diagnosis not present

## 2014-07-21 DIAGNOSIS — N401 Enlarged prostate with lower urinary tract symptoms: Secondary | ICD-10-CM | POA: Diagnosis not present

## 2014-07-21 DIAGNOSIS — R7309 Other abnormal glucose: Secondary | ICD-10-CM | POA: Diagnosis not present

## 2014-07-21 DIAGNOSIS — I10 Essential (primary) hypertension: Secondary | ICD-10-CM | POA: Diagnosis not present

## 2014-07-21 DIAGNOSIS — R634 Abnormal weight loss: Secondary | ICD-10-CM | POA: Diagnosis not present

## 2014-08-04 DIAGNOSIS — L989 Disorder of the skin and subcutaneous tissue, unspecified: Secondary | ICD-10-CM | POA: Diagnosis not present

## 2014-08-04 DIAGNOSIS — Z85828 Personal history of other malignant neoplasm of skin: Secondary | ICD-10-CM | POA: Diagnosis not present

## 2014-08-04 DIAGNOSIS — D2330 Other benign neoplasm of skin of unspecified part of face: Secondary | ICD-10-CM | POA: Diagnosis not present

## 2014-08-18 ENCOUNTER — Other Ambulatory Visit (HOSPITAL_COMMUNITY)
Admission: RE | Admit: 2014-08-18 | Discharge: 2014-08-18 | Disposition: A | Discharge status: Home / Self Care | Payer: Medicare Other | Source: Ambulatory Visit | Attending: General Surgery | Admitting: General Surgery

## 2014-08-18 DIAGNOSIS — Z029 Encounter for administrative examinations, unspecified: Secondary | ICD-10-CM | POA: Insufficient documentation

## 2014-08-18 DIAGNOSIS — C44629 Squamous cell carcinoma of skin of left upper limb, including shoulder: Secondary | ICD-10-CM | POA: Diagnosis not present

## 2014-08-18 DIAGNOSIS — Z85828 Personal history of other malignant neoplasm of skin: Secondary | ICD-10-CM | POA: Diagnosis not present

## 2014-08-18 DIAGNOSIS — C44729 Squamous cell carcinoma of skin of left lower limb, including hip: Secondary | ICD-10-CM | POA: Diagnosis not present

## 2014-08-31 DIAGNOSIS — Z85828 Personal history of other malignant neoplasm of skin: Secondary | ICD-10-CM | POA: Diagnosis not present

## 2014-08-31 DIAGNOSIS — C44729 Squamous cell carcinoma of skin of left lower limb, including hip: Secondary | ICD-10-CM | POA: Diagnosis not present

## 2014-12-05 ENCOUNTER — Inpatient Hospital Stay (HOSPITAL_COMMUNITY)
Admission: EM | Admit: 2014-12-05 | Discharge: 2014-12-09 | DRG: 470 | Disposition: A | Discharge status: Skilled Nursing Facility | Payer: Medicare Other | Attending: Pulmonary Disease | Admitting: Pulmonary Disease

## 2014-12-05 ENCOUNTER — Emergency Department (HOSPITAL_COMMUNITY): Payer: Medicare Other

## 2014-12-05 ENCOUNTER — Encounter (HOSPITAL_COMMUNITY): Payer: Self-pay | Admitting: Cardiology

## 2014-12-05 DIAGNOSIS — W010XXA Fall on same level from slipping, tripping and stumbling without subsequent striking against object, initial encounter: Secondary | ICD-10-CM | POA: Diagnosis present

## 2014-12-05 DIAGNOSIS — I10 Essential (primary) hypertension: Secondary | ICD-10-CM | POA: Diagnosis not present

## 2014-12-05 DIAGNOSIS — S72011A Unspecified intracapsular fracture of right femur, initial encounter for closed fracture: Secondary | ICD-10-CM | POA: Diagnosis present

## 2014-12-05 DIAGNOSIS — M25551 Pain in right hip: Secondary | ICD-10-CM | POA: Diagnosis not present

## 2014-12-05 DIAGNOSIS — Z87891 Personal history of nicotine dependence: Secondary | ICD-10-CM | POA: Diagnosis not present

## 2014-12-05 DIAGNOSIS — T148 Other injury of unspecified body region: Secondary | ICD-10-CM | POA: Diagnosis not present

## 2014-12-05 DIAGNOSIS — Z8249 Family history of ischemic heart disease and other diseases of the circulatory system: Secondary | ICD-10-CM | POA: Diagnosis not present

## 2014-12-05 DIAGNOSIS — S72001A Fracture of unspecified part of neck of right femur, initial encounter for closed fracture: Secondary | ICD-10-CM | POA: Diagnosis present

## 2014-12-05 DIAGNOSIS — E785 Hyperlipidemia, unspecified: Secondary | ICD-10-CM | POA: Diagnosis present

## 2014-12-05 DIAGNOSIS — Z9181 History of falling: Secondary | ICD-10-CM | POA: Diagnosis not present

## 2014-12-05 DIAGNOSIS — Y92017 Garden or yard in single-family (private) house as the place of occurrence of the external cause: Secondary | ICD-10-CM

## 2014-12-05 DIAGNOSIS — I129 Hypertensive chronic kidney disease with stage 1 through stage 4 chronic kidney disease, or unspecified chronic kidney disease: Secondary | ICD-10-CM | POA: Diagnosis present

## 2014-12-05 DIAGNOSIS — Z96641 Presence of right artificial hip joint: Secondary | ICD-10-CM | POA: Diagnosis not present

## 2014-12-05 DIAGNOSIS — N182 Chronic kidney disease, stage 2 (mild): Secondary | ICD-10-CM | POA: Diagnosis present

## 2014-12-05 DIAGNOSIS — Z471 Aftercare following joint replacement surgery: Secondary | ICD-10-CM | POA: Diagnosis not present

## 2014-12-05 DIAGNOSIS — Y9389 Activity, other specified: Secondary | ICD-10-CM | POA: Diagnosis not present

## 2014-12-05 DIAGNOSIS — G8918 Other acute postprocedural pain: Secondary | ICD-10-CM | POA: Diagnosis not present

## 2014-12-05 DIAGNOSIS — R278 Other lack of coordination: Secondary | ICD-10-CM | POA: Diagnosis not present

## 2014-12-05 DIAGNOSIS — S72009A Fracture of unspecified part of neck of unspecified femur, initial encounter for closed fracture: Secondary | ICD-10-CM | POA: Diagnosis present

## 2014-12-05 DIAGNOSIS — M6281 Muscle weakness (generalized): Secondary | ICD-10-CM | POA: Diagnosis not present

## 2014-12-05 DIAGNOSIS — Z09 Encounter for follow-up examination after completed treatment for conditions other than malignant neoplasm: Secondary | ICD-10-CM

## 2014-12-05 DIAGNOSIS — R262 Difficulty in walking, not elsewhere classified: Secondary | ICD-10-CM | POA: Diagnosis not present

## 2014-12-05 DIAGNOSIS — S299XXA Unspecified injury of thorax, initial encounter: Secondary | ICD-10-CM | POA: Diagnosis not present

## 2014-12-05 DIAGNOSIS — N400 Enlarged prostate without lower urinary tract symptoms: Secondary | ICD-10-CM | POA: Diagnosis present

## 2014-12-05 DIAGNOSIS — S72001D Fracture of unspecified part of neck of right femur, subsequent encounter for closed fracture with routine healing: Secondary | ICD-10-CM | POA: Diagnosis not present

## 2014-12-05 DIAGNOSIS — M84459A Pathological fracture, hip, unspecified, initial encounter for fracture: Secondary | ICD-10-CM | POA: Diagnosis not present

## 2014-12-05 DIAGNOSIS — R488 Other symbolic dysfunctions: Secondary | ICD-10-CM | POA: Diagnosis not present

## 2014-12-05 DIAGNOSIS — R1312 Dysphagia, oropharyngeal phase: Secondary | ICD-10-CM | POA: Diagnosis not present

## 2014-12-05 LAB — BASIC METABOLIC PANEL
ANION GAP: 11 (ref 5–15)
BUN: 36 mg/dL — ABNORMAL HIGH (ref 6–20)
CALCIUM: 8.8 mg/dL — AB (ref 8.9–10.3)
CO2: 23 mmol/L (ref 22–32)
CREATININE: 1.65 mg/dL — AB (ref 0.61–1.24)
Chloride: 108 mmol/L (ref 101–111)
GFR, EST AFRICAN AMERICAN: 42 mL/min — AB (ref >60)
GFR, EST NON AFRICAN AMERICAN: 36 mL/min — AB (ref >60)
Glucose, Bld: 118 mg/dL — ABNORMAL HIGH (ref 65–99)
Potassium: 4.4 mmol/L (ref 3.5–5.1)
Sodium: 142 mmol/L (ref 135–145)

## 2014-12-05 LAB — CBC WITH DIFFERENTIAL/PLATELET
BASOS ABS: 0 10*3/uL (ref 0.0–0.1)
BASOS PCT: 0 %
EOS ABS: 0 10*3/uL (ref 0.0–0.7)
Eosinophils Relative: 0 %
HEMATOCRIT: 37.1 % — AB (ref 39.0–52.0)
Hemoglobin: 12.8 g/dL — ABNORMAL LOW (ref 13.0–17.0)
Lymphocytes Relative: 8 %
Lymphs Abs: 1.1 10*3/uL (ref 0.7–4.0)
MCH: 31.2 pg (ref 26.0–34.0)
MCHC: 34.5 g/dL (ref 30.0–36.0)
MCV: 90.5 fL (ref 78.0–100.0)
MONO ABS: 0.8 10*3/uL (ref 0.1–1.0)
Monocytes Relative: 6 %
NEUTROS ABS: 11.4 10*3/uL — AB (ref 1.7–7.7)
NEUTROS PCT: 86 %
Platelets: 179 10*3/uL (ref 150–400)
RBC: 4.1 MIL/uL — ABNORMAL LOW (ref 4.22–5.81)
RDW: 13 % (ref 11.5–15.5)
WBC: 13.3 10*3/uL — ABNORMAL HIGH (ref 4.0–10.5)

## 2014-12-05 LAB — PROTIME-INR
INR: 1.15 (ref 0.00–1.49)
PROTHROMBIN TIME: 14.9 s (ref 11.6–15.2)

## 2014-12-05 LAB — TROPONIN I

## 2014-12-05 LAB — SURGICAL PCR SCREEN
MRSA, PCR: NEGATIVE
STAPHYLOCOCCUS AUREUS: NEGATIVE

## 2014-12-05 MED ORDER — SODIUM CHLORIDE 0.9 % IV SOLN: INTRAVENOUS | Status: DC

## 2014-12-05 MED ORDER — ROSUVASTATIN CALCIUM 20 MG PO TABS
20.0000 mg | ORAL_TABLET | Freq: Every day | ORAL | Status: DC
  Administered 2014-12-05 – 2014-12-08 (×4): 20 mg via ORAL
  Filled 2014-12-05 (×4): qty 1

## 2014-12-05 MED ORDER — DUTASTERIDE 0.5 MG PO CAPS
0.5000 mg | ORAL_CAPSULE | Freq: Every day | ORAL | Status: DC
  Administered 2014-12-05 – 2014-12-08 (×4): 0.5 mg via ORAL
  Filled 2014-12-05 (×6): qty 1

## 2014-12-05 MED ORDER — CLONIDINE HCL 0.1 MG PO TABS
0.1000 mg | ORAL_TABLET | Freq: Every day | ORAL | Status: DC
  Administered 2014-12-05 – 2014-12-07 (×3): 0.1 mg via ORAL
  Filled 2014-12-05 (×3): qty 1

## 2014-12-05 MED ORDER — ALUM & MAG HYDROXIDE-SIMETH 200-200-20 MG/5ML PO SUSP: 30.0000 mL | Freq: Four times a day (QID) | ORAL | Status: DC | PRN

## 2014-12-05 MED ORDER — HYDROMORPHONE HCL 1 MG/ML IJ SOLN
0.5000 mg | INTRAMUSCULAR | Status: DC | PRN
  Administered 2014-12-06 – 2014-12-07 (×7): 1 mg via INTRAVENOUS
  Filled 2014-12-05 (×7): qty 1

## 2014-12-05 MED ORDER — HYDROMORPHONE HCL 1 MG/ML IJ SOLN: 0.5000 mg | INTRAMUSCULAR | Status: DC | PRN

## 2014-12-05 MED ORDER — OXYCODONE HCL 5 MG PO TABS: 5.0000 mg | ORAL_TABLET | ORAL | Status: DC | PRN

## 2014-12-05 MED ORDER — CLONIDINE HCL 0.3 MG/24HR TD PTWK
0.3000 mg | MEDICATED_PATCH | TRANSDERMAL | Status: DC
  Administered 2014-12-08: 0.3 mg via TRANSDERMAL
  Filled 2014-12-05: qty 1

## 2014-12-05 MED ORDER — ONDANSETRON HCL 4 MG/2ML IJ SOLN
4.0000 mg | INTRAMUSCULAR | Status: AC | PRN
  Administered 2014-12-05 (×2): 4 mg via INTRAVENOUS
  Filled 2014-12-05 (×2): qty 2

## 2014-12-05 MED ORDER — SODIUM CHLORIDE 0.9 % IV SOLN
INTRAVENOUS | Status: DC
  Administered 2014-12-05: 23:00:00 via INTRAVENOUS

## 2014-12-05 MED ORDER — ONDANSETRON HCL 4 MG/2ML IJ SOLN: 4.0000 mg | Freq: Three times a day (TID) | INTRAMUSCULAR | Status: DC | PRN

## 2014-12-05 MED ORDER — ONDANSETRON HCL 4 MG PO TABS: 4.0000 mg | ORAL_TABLET | Freq: Four times a day (QID) | ORAL | Status: DC | PRN

## 2014-12-05 MED ORDER — SODIUM CHLORIDE 0.9 % IV SOLN
INTRAVENOUS | Status: DC
  Administered 2014-12-05 – 2014-12-06 (×2): via INTRAVENOUS

## 2014-12-05 MED ORDER — NEBIVOLOL HCL 10 MG PO TABS
20.0000 mg | ORAL_TABLET | Freq: Every day | ORAL | Status: DC
  Administered 2014-12-05 – 2014-12-09 (×4): 20 mg via ORAL
  Filled 2014-12-05 (×5): qty 2

## 2014-12-05 MED ORDER — ACETAMINOPHEN 650 MG RE SUPP: 650.0000 mg | Freq: Four times a day (QID) | RECTAL | Status: DC | PRN

## 2014-12-05 MED ORDER — MORPHINE SULFATE (PF) 4 MG/ML IV SOLN
4.0000 mg | INTRAVENOUS | Status: DC | PRN
  Administered 2014-12-05: 4 mg via INTRAVENOUS
  Filled 2014-12-05: qty 1

## 2014-12-05 MED ORDER — ONDANSETRON HCL 4 MG/2ML IJ SOLN: 4.0000 mg | Freq: Four times a day (QID) | INTRAMUSCULAR | Status: DC | PRN

## 2014-12-05 MED ORDER — HYDRALAZINE HCL 25 MG PO TABS
50.0000 mg | ORAL_TABLET | Freq: Two times a day (BID) | ORAL | Status: DC
  Administered 2014-12-05 – 2014-12-08 (×5): 50 mg via ORAL
  Filled 2014-12-05 (×6): qty 2

## 2014-12-05 MED ORDER — ACETAMINOPHEN 325 MG PO TABS: 650.0000 mg | ORAL_TABLET | Freq: Four times a day (QID) | ORAL | Status: DC | PRN

## 2014-12-05 NOTE — ED Notes (Signed)
Placed on 2 liters [place].  sats up to 91%

## 2014-12-05 NOTE — H&P (Signed)
[facility] Admission History and Physical       Dominic Hodges [id number] DOB: 1927/10/03 DOA: 12/05/2014  Referring physician: EDP PCP: Alonza Bogus, MD  Specialists:   Chief Complaint: Right Hip Pain  HPI: Dominic Hodges is a 81 y.o. male with a history of HTN, Hyperlipidemia, and BPH who was in his yard doing yard work and he was pulling up a rosebush and fell back onto his right hip and had increased pain and could not bear weight on his right leg.   He was taken to the ED and found on X-rays to have a closed Right Hip Fracture.   Arrangements were made for admission and Orthopedic Dr Aline Brochure was consulted to see him in the AM for Repair.   He complaints of 8/10 pain of his right hip if he moves.      Review of Systems:  Constitutional: No Weight Loss, No Weight Gain, Night Sweats, Fevers, Chills, Dizziness, Light Headedness, Fatigue, or Generalized Weakness HEENT: No Headaches, Difficulty Swallowing,Tooth/Dental Problems,Sore Throat,  No Sneezing, Rhinitis, Ear Ache, Nasal Congestion, or Post Nasal Drip,  Cardio-vascular:  No Chest pain, Orthopnea, PND, Edema in Lower Extremities, Anasarca, Dizziness, Palpitations  Resp: No Dyspnea, No DOE, No Productive Cough, No Non-Productive Cough, No Hemoptysis, No Wheezing.    GI: No Heartburn, Indigestion, Abdominal Pain, Nausea, Vomiting, Diarrhea, Constipation, Hematemesis, Hematochezia, Melena, Change in Bowel Habits,  Loss of Appetite  GU: No Dysuria, No Change in Color of Urine, No Urgency or Urinary Frequency, No Flank pain.  Musculoskeletal: +Right Hip Pain with Decreased Range of Motion, No Back Pain.  Neurologic: No Syncope, No Seizures, Muscle Weakness, Paresthesia, Vision Disturbance or Loss, No Diplopia, No Vertigo, No Difficulty Walking,  Skin: No Rash or Lesions. Psych: No Change in Mood or Affect, No Depression or Anxiety, No Memory loss, No Confusion, or Hallucinations   Past Medical History    Diagnosis Date  . Hypertension   . Hyperlipidemia   . BPH (benign prostatic hyperplasia)      Past Surgical History  Procedure Laterality Date  . Appendectomy      approx 30 years ago/lwb  . Umbilical hernia repair      Approx 10 years ago/lwb  . Ureteral stone extraction    . Cataract extraction w/phaco Left 09/11/2012    Procedure: CATARACT EXTRACTION PHACO AND INTRAOCULAR LENS PLACEMENT (IOC);  Surgeon: Tonny Branch, MD;  Location: [facility];  Service: Ophthalmology;  Laterality: Left;  CDE: 19.52      Prior to Admission medications   Medication Sig Start Date End Date Taking? Authorizing Provider  aspirin 81 MG EC tablet Take 81 mg by mouth daily.     Yes Historical Provider, MD  Azilsartan Medoxomil (EDARBI) 40 MG TABS Take 1 tablet by mouth daily.   Yes Historical Provider, MD  cloNIDine (CATAPRES - DOSED IN MG/24 HR) 0.3 mg/24hr patch Place 0.3 mg onto the skin once a week.  11/15/14  Yes Historical Provider, MD  cloNIDine (CATAPRES) 0.1 MG tablet Take 0.1 mg by mouth at bedtime.    Yes Historical Provider, MD  dutasteride (AVODART) 0.5 MG capsule Take 0.5 mg by mouth daily.    Yes Historical Provider, MD  hydrALAZINE (APRESOLINE) 50 MG tablet Take 50 mg by mouth 2 (two) times daily.    Yes Historical Provider, MD  Nebivolol HCl (BYSTOLIC) 20 MG TABS Take 20 mg by mouth daily.   Yes Historical Provider, MD  rosuvastatin (CRESTOR) 20 MG tablet  Take 20 mg by mouth daily.     Yes Historical Provider, MD  azithromycin (ZITHROMAX) 250 MG tablet Starting on 11/21/2014, take two tablets on day 1 then take 1 tablet on days 2 through 5 11/21/14   Historical Provider, MD  cephALEXin (KEFLEX) 500 MG capsule Take 500 mg by mouth 3 (three) times daily. 10 day course starting on 11/18/2014 11/18/14   Historical Provider, MD     No Known Allergies  Social History:  reports that he has quit smoking. His smoking use included Pipe. He does not have any smokeless tobacco history on file. He reports  that he drinks alcohol. He reports that he does not use illicit drugs.    Family History  Problem Relation Age of Onset  . Coronary artery disease      no family history of premature CAD       Physical Exam:  GEN:  Pleasant Elderly Well Nourished and Well Developed  81 y.o. Caucasian male examined and in no acute distress; cooperative with exam Filed Vitals:   12/05/14 1930 12/05/14 2000 12/05/14 2018 12/05/14 2030  BP: 119/50 122/69 128/51 110/48  Pulse: 85 82 82 82  Temp:   98.1 F (36.7 C)   TempSrc:   Oral   Resp: 18 14 18 17   Height:      Weight:      SpO2: 91% 92% 92% 89%   Blood pressure 110/48, pulse 82, temperature 98.1 F (36.7 C), temperature source Oral, resp. rate 17, height 6' 0.5" (1.842 m), weight 77.111 kg (170 lb), SpO2 89 %. PSYCH: He is alert and oriented x4; does not appear anxious does not appear depressed; affect is normal HEENT: Normocephalic and Atraumatic, Mucous membranes pink; PERRLA; EOM intact; Fundi:  Benign;  No scleral icterus, Nares: Patent, Oropharynx: Clear, Fair Dentition,    Neck:  FROM, No Cervical Lymphadenopathy nor Thyromegaly or Carotid Bruit; No JVD; Breasts:: Not examined CHEST WALL: No tenderness CHEST: Normal respiration, clear to auscultation bilaterally HEART: Regular rate and rhythm; no murmurs rubs or gallops BACK: No kyphosis or scoliosis; No CVA tenderness ABDOMEN: Positive Bowel Sounds, Soft Non-Tender, No Rebound or Guarding; No Masses, No Organomegaly, No Pannus; No Intertriginous candida. Rectal Exam: Not done EXTREMITIES: No Cyanosis, Clubbing, or Edema; No Ulcerations. Genitalia: not examined PULSES: 2+ and symmetric SKIN: Normal hydration no rash or ulceration CNS:  Alert and Oriented x 4, No Focal Deficits Vascular: pulses palpable throughout    Labs on Admission:  Basic Metabolic Panel:  Recent Labs Lab 12/05/14 1750  NA 142  K 4.4  CL 108  CO2 23  GLUCOSE 118*  BUN 36*  CREATININE 1.65*  CALCIUM  8.8*   Liver Function Tests: No results for input(s): AST, ALT, ALKPHOS, BILITOT, PROT, ALBUMIN in the last 168 hours. No results for input(s): LIPASE, AMYLASE in the last 168 hours. No results for input(s): AMMONIA in the last 168 hours. CBC:  Recent Labs Lab 12/05/14 1750  WBC 13.3*  NEUTROABS 11.4*  HGB 12.8*  HCT 37.1*  MCV 90.5  PLT 179   Cardiac Enzymes:  Recent Labs Lab 12/05/14 1750  TROPONINI <0.03    BNP (last 3 results) No results for input(s): BNP in the last 8760 hours.  ProBNP (last 3 results) No results for input(s): PROBNP in the last 8760 hours.  CBG: No results for input(s): GLUCAP in the last 168 hours.  Radiological Exams on Admission: Dg Chest 1 View  12/05/2014  CLINICAL DATA:  Fall  doing yard work.  Hip pain. EXAM: CHEST  1 VIEW COMPARISON:  Two-view chest x-ray 07/17/2014. FINDINGS: The heart size is normal. The lungs are clear. The visualized soft tissues and bony thorax are unremarkable. IMPRESSION: Negative one-view chest x-ray Electronically Signed   By: San Morelle M.D.   On: 12/05/2014 18:16   Dg Hip Unilat With Pelvis 2-3 Views Right  12/05/2014  CLINICAL DATA:  Status post fall while doing yard work, with right hip pain. Initial encounter. EXAM: DG HIP (WITH OR WITHOUT PELVIS) 2-3V RIGHT COMPARISON:  None. FINDINGS: There is a mildly displaced subcapital fracture through the right femoral neck. The right femoral head remains seated at the acetabulum. There is mild superior displacement of the distal femur. The left hip joint is grossly unremarkable. No significant degenerative change is appreciated. The sacroiliac joints are unremarkable in appearance. The visualized bowel gas pattern is grossly unremarkable in appearance. IMPRESSION: Mildly displaced subcapital fracture through the right femoral neck. Electronically Signed   By: Garald Balding M.D.   On: 12/05/2014 18:18     EKG: Independently reviewed.          AsseAssessment/Plan:       81 y.o. male with  Principal Problem:   1.     Closed right hip fracture ([place])- due to Mechanical Fall   NPO after Midnight   Pain Control with IV Dilaudid PRN   Bedrest    Active Problems:   2.     Hypertension   IV Hydralazine while NPO   On Hydralazine, and Clonidine Patch, and Bystolic Rx   Monitor BPs     3.     BPH (benign prostatic hyperplasia)   On Avodart Rx     4.     Hyperlipidemia   On Crestor Rx     5.     DVT Prophylaxis   SCDs   Code Status:     FULL CODE      Family Communication:   Family at Bedside    Disposition Plan:    Inpatient Status        Time spent:  [facility] Pager [phone]   If [place] Please Contact the Day Rounding Team MD for [facility]  If 7PM-7AM, Please Contact Night-Floor Coverage  [email or web address] Password [email or web address] 12/05/2014, 8:47 PM     ADDENDUM:   Patient was seen and examined on 12/05/2014

## 2014-12-05 NOTE — ED Provider Notes (Signed)
CSN: [id number]     Arrival date & time 12/05/14  1711 History   First MD Initiated Contact with Patient 12/05/14 1712     Chief Complaint  Patient presents with  . Fall      HPI Pt was seen at 1705. Per EMS and pt report, c/o sudden onset and resolution of one episode of slip and fall that occurred PTA. Pt was working outside when he fell backwards in his driveway. Pt c/o right hip pain. Pt was unable to weight bear RLE to due pain. He was finally able crawl to car to honk horn to get his wife's attention in the house. Pt was outside approximately 46min total before EMS arrival. EMS gave IV morphine for right hip pain en route. Denies LOC/syncope, no AMS, no neck or back pain, no CP/SOB, no abd pain, no N/V/D, no tingling/numbness in extremities, no focal motor weakness.    Past Medical History  Diagnosis Date  . Hypertension   . Hyperlipidemia   . BPH (benign prostatic hyperplasia)    Past Surgical History  Procedure Laterality Date  . Appendectomy      approx 30 years ago/lwb  . Umbilical hernia repair      Approx 10 years ago/lwb  . Ureteral stone extraction    . Cataract extraction w/phaco Left 09/11/2012    Procedure: CATARACT EXTRACTION PHACO AND INTRAOCULAR LENS PLACEMENT (IOC);  Surgeon: Tonny Branch, MD;  Location: [facility];  Service: Ophthalmology;  Laterality: Left;  CDE: 19.52   Family History  Problem Relation Age of Onset  . Coronary artery disease      no family history of premature CAD   Social History  Substance Use Topics  . Smoking status: Former Smoker    Types: Pipe  . Smokeless tobacco: None  . Alcohol Use: Yes     Comment: occasional social drink/lwb    Review of Systems ROS: Statement: All systems negative except as marked or noted in the HPI; Constitutional: Negative for fever and chills. ; ; Eyes: Negative for eye pain, redness and discharge. ; ; ENMT: Negative for ear pain, hoarseness, nasal congestion, sinus pressure and sore throat. ; ;  Cardiovascular: Negative for chest pain, palpitations, diaphoresis, dyspnea and peripheral edema. ; ; Respiratory: Negative for cough, wheezing and stridor. ; ; Gastrointestinal: Negative for nausea, vomiting, diarrhea, abdominal pain, blood in stool, hematemesis, jaundice and rectal bleeding. . ; ; Genitourinary: Negative for dysuria, flank pain and hematuria. ; ; Musculoskeletal: +right hip pain. Negative for back pain and neck pain. Negative for swelling.; ; Skin: Negative for pruritus, rash, abrasions, blisters, bruising and skin lesion.; ; Neuro: Negative for headache, lightheadedness and neck stiffness. Negative for weakness, altered level of consciousness , altered mental status, extremity weakness, paresthesias, involuntary movement, seizure and syncope.      Allergies  Review of patient's allergies indicates no known allergies.  Home Medications   Prior to Admission medications   Medication Sig Start Date End Date Taking? Authorizing Provider  aspirin 81 MG EC tablet Take 81 mg by mouth daily.     Yes Historical Provider, MD  Azilsartan Medoxomil (EDARBI) 40 MG TABS Take 1 tablet by mouth daily.   Yes Historical Provider, MD  cloNIDine (CATAPRES - DOSED IN MG/24 HR) 0.3 mg/24hr patch Place 0.3 mg onto the skin once a week.  11/15/14  Yes Historical Provider, MD  cloNIDine (CATAPRES) 0.1 MG tablet Take 0.1 mg by mouth at bedtime.    Yes Historical Provider, MD  dutasteride (AVODART) 0.5 MG capsule Take 0.5 mg by mouth daily.    Yes Historical Provider, MD  hydrALAZINE (APRESOLINE) 50 MG tablet Take 50 mg by mouth 2 (two) times daily.    Yes Historical Provider, MD  Nebivolol HCl (BYSTOLIC) 20 MG TABS Take 20 mg by mouth daily.   Yes Historical Provider, MD  rosuvastatin (CRESTOR) 20 MG tablet Take 20 mg by mouth daily.     Yes Historical Provider, MD  azithromycin (ZITHROMAX) 250 MG tablet Starting on 11/21/2014, take two tablets on day 1 then take 1 tablet on days 2 through 5 11/21/14    Historical Provider, MD  cephALEXin (KEFLEX) 500 MG capsule Take 500 mg by mouth 3 (three) times daily. 10 day course starting on 11/18/2014 11/18/14   Historical Provider, MD   BP 145/60 mmHg  Pulse 78  Temp(Src) 98 F (36.7 C) (Oral)  Ht 6' 0.5" (1.842 m)  Wt 170 lb (77.111 kg)  BMI 22.73 kg/m2  SpO2 88% Physical Exam  1710: Physical examination:  Nursing notes reviewed; Vital signs and O2 SAT reviewed;  Constitutional: Well developed, Well nourished, Well hydrated, Uncomfortable appearing.;; Head:  Normocephalic, atraumatic; Eyes: EOMI, PERRL, No scleral icterus; ENMT: Mouth and pharynx normal, Mucous membranes moist; Neck: Supple, Full range of motion, No lymphadenopathy; Cardiovascular: Regular rate and rhythm, No gallop; Respiratory: Breath sounds clear & equal bilaterally, No wheezes.  Speaking full sentences with ease, Normal respiratory effort/excursion; Chest: Nontender, Movement normal; Abdomen: Soft, Nontender, Nondistended, Normal bowel sounds; Genitourinary: No CVA tenderness; Extremities: Pulses normal, Pelvis stable. +right hip tenderness to palp. NMS intact distal RLE. NT right knee/ankle/foot. No edema, No calf edema or asymmetry.; Neuro: AA&Ox3, Major CN grossly intact.  Speech clear. No gross focal motor or sensory deficits in extremities.; Skin: Color normal, Warm, Dry.   ED Course  Procedures (including critical care time) Labs Review   Imaging Review  I have personally reviewed and evaluated these images and lab results as part of my medical decision-making.   EKG Interpretation   Date/Time:  Sunday December 05 2014 18:15:08 EST Ventricular Rate:  87 PR Interval:  189 QRS Duration: 122 QT Interval:  386 QTC Calculation: 464 R Axis:   -59 Text Interpretation:  Sinus or ectopic atrial rhythm Nonspecific IVCD with  LAD Artifact When compared with ECG of 09/09/2012 Rate faster Confirmed by  [facility]  MD, Nunzio Cory [phone]) on 12/05/2014 6:30:22 PM      MDM   MDM Reviewed: previous chart, nursing note and vitals Reviewed previous: labs and ECG Interpretation: labs, ECG and x-ray      Results for orders placed or performed during the hospital encounter of [id number]  Basic metabolic panel  Result Value Ref Range   Sodium 142 135 - 145 mmol/L   Potassium 4.4 3.5 - 5.1 mmol/L   Chloride 108 101 - 111 mmol/L   CO2 23 22 - 32 mmol/L   Glucose, Bld 118 (H) 65 - 99 mg/dL   BUN 36 (H) 6 - 20 mg/dL   Creatinine, Ser 1.65 (H) 0.61 - 1.24 mg/dL   Calcium 8.8 (L) 8.9 - 10.3 mg/dL   GFR calc non Af Amer 36 (L) >60 mL/min   GFR calc Af Amer 42 (L) >60 mL/min   Anion gap 11 5 - 15  CBC with Differential  Result Value Ref Range   WBC 13.3 (H) 4.0 - 10.5 K/uL   RBC 4.10 (L) 4.22 - 5.81 MIL/uL   Hemoglobin 12.8 (L) 13.0 -  17.0 g/dL   HCT 37.1 (L) 39.0 - 52.0 %   MCV 90.5 78.0 - 100.0 fL   MCH 31.2 26.0 - 34.0 pg   MCHC 34.5 30.0 - 36.0 g/dL   RDW 13.0 11.5 - 15.5 %   Platelets 179 150 - 400 K/uL   Neutrophils Relative % 86 %   Neutro Abs 11.4 (H) 1.7 - 7.7 K/uL   Lymphocytes Relative 8 %   Lymphs Abs 1.1 0.7 - 4.0 K/uL   Monocytes Relative 6 %   Monocytes Absolute 0.8 0.1 - 1.0 K/uL   Eosinophils Relative 0 %   Eosinophils Absolute 0.0 0.0 - 0.7 K/uL   Basophils Relative 0 %   Basophils Absolute 0.0 0.0 - 0.1 K/uL  Protime-INR  Result Value Ref Range   Prothrombin Time 14.9 11.6 - 15.2 seconds   INR 1.15 0.00 - 1.49  Troponin I  Result Value Ref Range   Troponin I <0.03 <0.031 ng/mL   Dg Chest 1 View 12/05/2014  CLINICAL DATA:  Fall doing yard work.  Hip pain. EXAM: CHEST  1 VIEW COMPARISON:  Two-view chest x-ray 07/17/2014. FINDINGS: The heart size is normal. The lungs are clear. The visualized soft tissues and bony thorax are unremarkable. IMPRESSION: Negative one-view chest x-ray Electronically Signed   By: San Morelle M.D.   On: 12/05/2014 18:16   Dg Hip Unilat With Pelvis 2-3 Views Right 12/05/2014  CLINICAL DATA:   Status post fall while doing yard work, with right hip pain. Initial encounter. EXAM: DG HIP (WITH OR WITHOUT PELVIS) 2-3V RIGHT COMPARISON:  None. FINDINGS: There is a mildly displaced subcapital fracture through the right femoral neck. The right femoral head remains seated at the acetabulum. There is mild superior displacement of the distal femur. The left hip joint is grossly unremarkable. No significant degenerative change is appreciated. The sacroiliac joints are unremarkable in appearance. The visualized bowel gas pattern is grossly unremarkable in appearance. IMPRESSION: Mildly displaced subcapital fracture through the right femoral neck. Electronically Signed   By: Garald Balding M.D.   On: 12/05/2014 18:18    Results for VINNIE, DIONNE (MRN [id number]) as of 12/05/2014 19:28  Ref. Range 06/02/2010 13:02 09/09/2012 12:30 12/05/2014 17:50  BUN Latest Ref Range: 6-20 mg/dL 33 (H) 34 (H) 36 (H)  Creatinine Latest Ref Range: 0.61-1.24 mg/dL 1.60 (H) 1.58 (H) 1.65 (H)     1830:  XR with right hip fx. Pt prefers to stay at [facility]. T/C to Ortho Dr. Aline Brochure, case discussed, including:  HPI, pertinent PM/SHx, VS/PE, dx testing, ED course and treatment:  Agreeable to consult, requests to admit to [facility], NPO for repair tomorrow. Dx and testing d/w pt and family.  Questions answered.  Verb understanding, agreeable to admit.   1935:  BUN/Cr per baseline. T/C to [facility] Dr. Arnoldo Morale, case discussed, including:  HPI, pertinent PM/SHx, VS/PE, dx testing, ED course and treatment:  Agreeable to admit, requests to write temporary orders, obtain medical bed to Dr. Luan Pulling' service.   Francine Graven, DO 12/07/14 1948

## 2014-12-05 NOTE — ED Notes (Signed)
[place] backwards in the driveway.  Layed in driveway for 45 min.  Denies LOC.  C/o right hip pain. 10/10.  EMS gave morphine 4 mg IV.  VSS with EMS.

## 2014-12-05 NOTE — ED Notes (Signed)
Report given to [facility] RN on 300

## 2014-12-06 ENCOUNTER — Encounter (HOSPITAL_COMMUNITY)
Admission: EM | Disposition: A | Discharge status: Skilled Nursing Facility | Payer: Self-pay | Source: Home / Self Care | Attending: Pulmonary Disease

## 2014-12-06 ENCOUNTER — Inpatient Hospital Stay (HOSPITAL_COMMUNITY): Payer: Medicare Other | Admitting: Anesthesiology

## 2014-12-06 ENCOUNTER — Inpatient Hospital Stay (HOSPITAL_COMMUNITY): Payer: Medicare Other

## 2014-12-06 ENCOUNTER — Encounter (HOSPITAL_COMMUNITY): Payer: Self-pay | Admitting: *Deleted

## 2014-12-06 DIAGNOSIS — S72001A Fracture of unspecified part of neck of right femur, initial encounter for closed fracture: Secondary | ICD-10-CM

## 2014-12-06 HISTORY — PX: HIP ARTHROPLASTY: SHX981

## 2014-12-06 LAB — BASIC METABOLIC PANEL
ANION GAP: 4 — AB (ref 5–15)
BUN: 31 mg/dL — ABNORMAL HIGH (ref 6–20)
CO2: 27 mmol/L (ref 22–32)
CREATININE: 1.46 mg/dL — AB (ref 0.61–1.24)
Calcium: 8.4 mg/dL — ABNORMAL LOW (ref 8.9–10.3)
Chloride: 111 mmol/L (ref 101–111)
GFR calc non Af Amer: 42 mL/min — ABNORMAL LOW (ref >60)
GFR, EST AFRICAN AMERICAN: 49 mL/min — AB (ref >60)
Glucose, Bld: 111 mg/dL — ABNORMAL HIGH (ref 65–99)
Potassium: 4.3 mmol/L (ref 3.5–5.1)
SODIUM: 142 mmol/L (ref 135–145)

## 2014-12-06 LAB — ABO/RH: ABO/RH(D): A NEG

## 2014-12-06 LAB — CBC
HEMATOCRIT: 35.6 % — AB (ref 39.0–52.0)
Hemoglobin: 11.9 g/dL — ABNORMAL LOW (ref 13.0–17.0)
MCH: 30.6 pg (ref 26.0–34.0)
MCHC: 33.4 g/dL (ref 30.0–36.0)
MCV: 91.5 fL (ref 78.0–100.0)
PLATELETS: 168 10*3/uL (ref 150–400)
RBC: 3.89 MIL/uL — ABNORMAL LOW (ref 4.22–5.81)
RDW: 13 % (ref 11.5–15.5)
WBC: 9.7 10*3/uL (ref 4.0–10.5)

## 2014-12-06 LAB — PREPARE RBC (CROSSMATCH)

## 2014-12-06 LAB — HEMOGLOBIN: Hemoglobin: 11.3 g/dL — ABNORMAL LOW (ref 13.0–17.0)

## 2014-12-06 SURGERY — HEMIARTHROPLASTY, HIP, DIRECT ANTERIOR APPROACH, FOR FRACTURE
Anesthesia: Spinal | Site: Hip | Laterality: Right

## 2014-12-06 MED ORDER — ASPIRIN EC 325 MG PO TBEC
325.0000 mg | DELAYED_RELEASE_TABLET | Freq: Every day | ORAL | Status: DC
  Administered 2014-12-07 – 2014-12-09 (×3): 325 mg via ORAL
  Filled 2014-12-06 (×3): qty 1

## 2014-12-06 MED ORDER — IRBESARTAN 300 MG PO TABS
300.0000 mg | ORAL_TABLET | Freq: Every day | ORAL | Status: DC
  Administered 2014-12-06 – 2014-12-07 (×2): 300 mg via ORAL
  Filled 2014-12-06 (×3): qty 1

## 2014-12-06 MED ORDER — TRAMADOL HCL 50 MG PO TABS
50.0000 mg | ORAL_TABLET | Freq: Once | ORAL | Status: AC
  Administered 2014-12-06: 50 mg via ORAL

## 2014-12-06 MED ORDER — ACETAMINOPHEN 10 MG/ML IV SOLN
1000.0000 mg | Freq: Four times a day (QID) | INTRAVENOUS | Status: AC
Start: 2014-12-06 — End: 2014-12-07
  Administered 2014-12-06 – 2014-12-07 (×3): 1000 mg via INTRAVENOUS
  Filled 2014-12-06 (×4): qty 100

## 2014-12-06 MED ORDER — TRAMADOL HCL 50 MG PO TABS: 50.0000 mg | ORAL_TABLET | Freq: Four times a day (QID) | ORAL | Status: DC

## 2014-12-06 MED ORDER — SODIUM CHLORIDE 0.9 % IV SOLN: Freq: Once | INTRAVENOUS | Status: AC

## 2014-12-06 MED ORDER — SODIUM CHLORIDE 0.9 % IR SOLN
Status: DC | PRN
  Administered 2014-12-06 (×2): 1000 mL

## 2014-12-06 MED ORDER — DOCUSATE SODIUM 100 MG PO CAPS
100.0000 mg | ORAL_CAPSULE | Freq: Two times a day (BID) | ORAL | Status: DC
  Administered 2014-12-06 – 2014-12-09 (×6): 100 mg via ORAL
  Filled 2014-12-06 (×5): qty 1

## 2014-12-06 MED ORDER — BISACODYL 5 MG PO TBEC: 5.0000 mg | DELAYED_RELEASE_TABLET | Freq: Every day | ORAL | Status: DC | PRN

## 2014-12-06 MED ORDER — SODIUM CHLORIDE 0.9 % IV SOLN
INTRAVENOUS | Status: DC
  Administered 2014-12-06: 11:00:00 via INTRAVENOUS

## 2014-12-06 MED ORDER — SENNOSIDES-DOCUSATE SODIUM 8.6-50 MG PO TABS: 1.0000 | ORAL_TABLET | Freq: Every evening | ORAL | Status: DC | PRN

## 2014-12-06 MED ORDER — FENTANYL CITRATE (PF) 100 MCG/2ML IJ SOLN
INTRAMUSCULAR | Status: AC
  Filled 2014-12-06: qty 2

## 2014-12-06 MED ORDER — ONDANSETRON HCL 4 MG/2ML IJ SOLN: 4.0000 mg | Freq: Once | INTRAMUSCULAR | Status: DC | PRN

## 2014-12-06 MED ORDER — METOCLOPRAMIDE HCL 10 MG PO TABS
5.0000 mg | ORAL_TABLET | Freq: Three times a day (TID) | ORAL | Status: DC | PRN
  Filled 2014-12-06: qty 2

## 2014-12-06 MED ORDER — SODIUM CHLORIDE 0.9 % IV SOLN
INTRAVENOUS | Status: DC
  Administered 2014-12-06 – 2014-12-07 (×2): via INTRAVENOUS

## 2014-12-06 MED ORDER — METOCLOPRAMIDE HCL 5 MG/ML IJ SOLN: 5.0000 mg | Freq: Three times a day (TID) | INTRAMUSCULAR | Status: DC | PRN

## 2014-12-06 MED ORDER — PROPOFOL 10 MG/ML IV BOLUS
INTRAVENOUS | Status: AC
  Filled 2014-12-06: qty 20

## 2014-12-06 MED ORDER — ONDANSETRON HCL 4 MG/2ML IJ SOLN
4.0000 mg | Freq: Once | INTRAMUSCULAR | Status: AC
  Administered 2014-12-06: 4 mg via INTRAVENOUS

## 2014-12-06 MED ORDER — MENTHOL 3 MG MT LOZG: 1.0000 | LOZENGE | OROMUCOSAL | Status: DC | PRN

## 2014-12-06 MED ORDER — CHLORHEXIDINE GLUCONATE 4 % EX LIQD
60.0000 mL | Freq: Once | CUTANEOUS | Status: AC
  Administered 2014-12-06: 4 via TOPICAL
  Filled 2014-12-06: qty 15

## 2014-12-06 MED ORDER — BUPIVACAINE IN DEXTROSE 0.75-8.25 % IT SOLN
INTRATHECAL | Status: AC
  Filled 2014-12-06: qty 2

## 2014-12-06 MED ORDER — MIDAZOLAM HCL 2 MG/2ML IJ SOLN
INTRAMUSCULAR | Status: AC
  Filled 2014-12-06: qty 2

## 2014-12-06 MED ORDER — EPINEPHRINE HCL 0.1 MG/ML IJ SOSY
PREFILLED_SYRINGE | INTRAMUSCULAR | Status: DC | PRN
  Administered 2014-12-06: .1 mL via INTRAVENOUS

## 2014-12-06 MED ORDER — PHENOL 1.4 % MT LIQD: 1.0000 | OROMUCOSAL | Status: DC | PRN

## 2014-12-06 MED ORDER — PROPOFOL 10 MG/ML IV BOLUS
INTRAVENOUS | Status: DC | PRN
  Administered 2014-12-06 (×2): 10 mg via INTRAVENOUS

## 2014-12-06 MED ORDER — ONDANSETRON HCL 4 MG/2ML IJ SOLN
4.0000 mg | Freq: Four times a day (QID) | INTRAMUSCULAR | Status: DC | PRN
  Administered 2014-12-06 – 2014-12-07 (×2): 4 mg via INTRAVENOUS
  Filled 2014-12-06 (×2): qty 2

## 2014-12-06 MED ORDER — FENTANYL CITRATE (PF) 100 MCG/2ML IJ SOLN
25.0000 ug | INTRAMUSCULAR | Status: DC
  Administered 2014-12-06: 25 ug via INTRAVENOUS

## 2014-12-06 MED ORDER — ONDANSETRON HCL 4 MG PO TABS: 4.0000 mg | ORAL_TABLET | Freq: Four times a day (QID) | ORAL | Status: DC | PRN

## 2014-12-06 MED ORDER — LACTATED RINGERS IV SOLN
INTRAVENOUS | Status: DC | PRN
  Administered 2014-12-06: 13:00:00 via INTRAVENOUS

## 2014-12-06 MED ORDER — BUPIVACAINE IN DEXTROSE 0.75-8.25 % IT SOLN
INTRATHECAL | Status: DC | PRN
  Administered 2014-12-06: 15 mg via INTRATHECAL

## 2014-12-06 MED ORDER — MIDAZOLAM HCL 2 MG/2ML IJ SOLN
1.0000 mg | INTRAMUSCULAR | Status: DC | PRN
  Administered 2014-12-06: 2 mg via INTRAVENOUS

## 2014-12-06 MED ORDER — DEXTROSE 5 % IV SOLN: 500.0000 mg | Freq: Four times a day (QID) | INTRAVENOUS | Status: DC | PRN

## 2014-12-06 MED ORDER — CEFAZOLIN SODIUM-DEXTROSE 2-3 GM-% IV SOLR
2.0000 g | INTRAVENOUS | Status: AC
  Administered 2014-12-06: 2 g via INTRAVENOUS
  Filled 2014-12-06 (×2): qty 50

## 2014-12-06 MED ORDER — EPHEDRINE SULFATE 50 MG/ML IJ SOLN
INTRAMUSCULAR | Status: DC | PRN
  Administered 2014-12-06: 5 mg via INTRAVENOUS
  Administered 2014-12-06 (×2): 10 mg via INTRAVENOUS
  Administered 2014-12-06: 5 mg via INTRAVENOUS
  Administered 2014-12-06: 10 mg via INTRAVENOUS

## 2014-12-06 MED ORDER — METHOCARBAMOL 500 MG PO TABS
500.0000 mg | ORAL_TABLET | Freq: Four times a day (QID) | ORAL | Status: DC | PRN
Start: 2014-12-06 — End: 2014-12-09
  Administered 2014-12-08: 500 mg via ORAL
  Filled 2014-12-06: qty 1

## 2014-12-06 MED ORDER — LACTATED RINGERS IV SOLN
INTRAVENOUS | Status: DC | PRN
  Administered 2014-12-06: 11:00:00 via INTRAVENOUS

## 2014-12-06 MED ORDER — FENTANYL CITRATE (PF) 100 MCG/2ML IJ SOLN
INTRAMUSCULAR | Status: DC | PRN
  Administered 2014-12-06 (×2): 25 ug via INTRAVENOUS
  Administered 2014-12-06: 25 ug via INTRATHECAL

## 2014-12-06 MED ORDER — TRAMADOL HCL 50 MG PO TABS
ORAL_TABLET | ORAL | Status: AC
  Filled 2014-12-06: qty 1

## 2014-12-06 MED ORDER — ACETAMINOPHEN 10 MG/ML IV SOLN
1000.0000 mg | Freq: Once | INTRAVENOUS | Status: AC
  Administered 2014-12-06: 1000 mg via INTRAVENOUS
  Filled 2014-12-06: qty 100

## 2014-12-06 MED ORDER — PROPOFOL 500 MG/50ML IV EMUL
INTRAVENOUS | Status: DC | PRN
  Administered 2014-12-06: 25 ug/kg/min via INTRAVENOUS

## 2014-12-06 MED ORDER — LIDOCAINE HCL (PF) 1 % IJ SOLN
INTRAMUSCULAR | Status: AC
  Filled 2014-12-06: qty 5

## 2014-12-06 MED ORDER — FLEET ENEMA 7-19 GM/118ML RE ENEM: 1.0000 | ENEMA | Freq: Once | RECTAL | Status: DC | PRN

## 2014-12-06 MED ORDER — CEFAZOLIN SODIUM-DEXTROSE 2-3 GM-% IV SOLR
2.0000 g | Freq: Four times a day (QID) | INTRAVENOUS | Status: AC
  Administered 2014-12-06 (×2): 2 g via INTRAVENOUS
  Filled 2014-12-06 (×2): qty 50

## 2014-12-06 MED ORDER — TRAMADOL HCL 50 MG PO TABS
50.0000 mg | ORAL_TABLET | Freq: Four times a day (QID) | ORAL | Status: DC
  Administered 2014-12-06 – 2014-12-07 (×4): 50 mg via ORAL
  Filled 2014-12-06 (×5): qty 1

## 2014-12-06 MED ORDER — ACETAMINOPHEN 10 MG/ML IV SOLN
1000.0000 mg | Freq: Four times a day (QID) | INTRAVENOUS | Status: DC
  Filled 2014-12-06 (×4): qty 100

## 2014-12-06 MED ORDER — FENTANYL CITRATE (PF) 100 MCG/2ML IJ SOLN: 25.0000 ug | INTRAMUSCULAR | Status: DC | PRN

## 2014-12-06 MED ORDER — BUPIVACAINE-EPINEPHRINE (PF) 0.5% -1:200000 IJ SOLN
INTRAMUSCULAR | Status: AC
  Filled 2014-12-06: qty 60

## 2014-12-06 MED ORDER — ONDANSETRON HCL 4 MG/2ML IJ SOLN
INTRAMUSCULAR | Status: AC
  Filled 2014-12-06: qty 2

## 2014-12-06 MED ORDER — BUPIVACAINE-EPINEPHRINE (PF) 0.5% -1:200000 IJ SOLN
INTRAMUSCULAR | Status: DC | PRN
  Administered 2014-12-06: 60 mL

## 2014-12-06 SURGICAL SUPPLY — 58 items
BAG HAMPER (MISCELLANEOUS) ×3 IMPLANT
BIT DRILL 2.8X128 (BIT) ×2 IMPLANT
BIT DRILL 2.8X128MM (BIT) ×1
BLADE HEX COATED 2.75 (ELECTRODE) ×3 IMPLANT
BLADE SAGITTAL 25.0X1.27X90 (BLADE) ×2 IMPLANT
BLADE SAGITTAL 25.0X1.27X90MM (BLADE) ×1
CAPT HIP HEMI 1 ×2 IMPLANT
CHLORAPREP W/TINT 26ML (MISCELLANEOUS) ×5 IMPLANT
CLOTH BEACON ORANGE TIMEOUT ST (SAFETY) ×3 IMPLANT
COVER LIGHT HANDLE STERIS (MISCELLANEOUS) ×6 IMPLANT
COVER PROBE W GEL 5X96 (DRAPES) ×3 IMPLANT
DECANTER SPIKE VIAL GLASS SM (MISCELLANEOUS) ×6 IMPLANT
DRAPE HIP W/POCKET STRL (DRAPE) ×3 IMPLANT
DRSG AQUACEL AG ADV 3.5X10 (GAUZE/BANDAGES/DRESSINGS) ×2 IMPLANT
ELECT REM PT RETURN 9FT ADLT (ELECTROSURGICAL) ×3
ELECTRODE REM PT RTRN 9FT ADLT (ELECTROSURGICAL) ×1 IMPLANT
EVACUATOR 3/16  PVC DRAIN (DRAIN)
EVACUATOR 3/16 PVC DRAIN (DRAIN) IMPLANT
FACESHIELD LNG OPTICON STERILE (SAFETY) ×3 IMPLANT
GLOVE BIO SURGEON STRL SZ 6.5 (GLOVE) ×1 IMPLANT
GLOVE BIO SURGEONS STRL SZ 6.5 (GLOVE) ×1
GLOVE BIOGEL PI IND STRL 7.0 (GLOVE) ×1 IMPLANT
GLOVE BIOGEL PI IND STRL 7.5 (GLOVE) IMPLANT
GLOVE BIOGEL PI INDICATOR 7.0 (GLOVE) ×8
GLOVE BIOGEL PI INDICATOR 7.5 (GLOVE) ×2
GLOVE ECLIPSE 6.5 STRL STRAW (GLOVE) ×4 IMPLANT
GLOVE SKINSENSE NS SZ8.0 LF (GLOVE) ×4
GLOVE SKINSENSE STRL SZ8.0 LF (GLOVE) ×2 IMPLANT
GLOVE SS N UNI LF 8.5 STRL (GLOVE) ×3 IMPLANT
GOWN STRL REUS W/TWL LRG LVL3 (GOWN DISPOSABLE) ×8 IMPLANT
GOWN STRL REUS W/TWL XL LVL3 (GOWN DISPOSABLE) ×3 IMPLANT
INST SET MAJOR BONE (KITS) ×3 IMPLANT
KIT BLADEGUARD II DBL (SET/KITS/TRAYS/PACK) ×3 IMPLANT
KIT ROOM TURNOVER APOR (KITS) ×3 IMPLANT
MANIFOLD NEPTUNE II (INSTRUMENTS) ×3 IMPLANT
MARKER SKIN DUAL TIP RULER LAB (MISCELLANEOUS) ×3 IMPLANT
NDL HYPO 21X1.5 SAFETY (NEEDLE) ×1 IMPLANT
NEEDLE HYPO 21X1.5 SAFETY (NEEDLE) ×3 IMPLANT
NS IRRIG 1000ML POUR BTL (IV SOLUTION) ×5 IMPLANT
PACK TOTAL JOINT (CUSTOM PROCEDURE TRAY) ×3 IMPLANT
PAD ARMBOARD 7.5X6 YLW CONV (MISCELLANEOUS) ×3 IMPLANT
PASSER SUT SWANSON 36MM LOOP (INSTRUMENTS) ×2 IMPLANT
PILLOW HIP ABDUCTION LRG (ORTHOPEDIC SUPPLIES) ×2 IMPLANT
PIN STMN SNGL STERILE 9X3.6MM (PIN) ×2 IMPLANT
SET BASIN LINEN APH (SET/KITS/TRAYS/PACK) ×3 IMPLANT
STAPLER VISISTAT 35W (STAPLE) ×3 IMPLANT
SUT BRALON NAB BRD #1 30IN (SUTURE) ×10 IMPLANT
SUT ETHIBOND 5 LR DA (SUTURE) ×6 IMPLANT
SUT MNCRL 0 VIOLET CTX 36 (SUTURE) ×1 IMPLANT
SUT MON AB 2-0 CT1 36 (SUTURE) ×3 IMPLANT
SUT MONOCRYL 0 CTX 36 (SUTURE) ×2
SUT VIC AB 1 CT1 27 (SUTURE) ×12
SUT VIC AB 1 CT1 27XBRD ANTBC (SUTURE) IMPLANT
SYR 30ML LL (SYRINGE) ×3 IMPLANT
SYR BULB IRRIGATION 50ML (SYRINGE) ×3 IMPLANT
TRAY FOLEY CATH SILVER 16FR (SET/KITS/TRAYS/PACK) ×2 IMPLANT
WATER STERILE IRR 1000ML POUR (IV SOLUTION) ×6 IMPLANT
YANKAUER SUCT 12FT TUBE ARGYLE (SUCTIONS) ×3 IMPLANT

## 2014-12-06 NOTE — Brief Op Note (Addendum)
12/05/2014 - 12/06/2014  2:34 PM  PATIENT:  Dominic Hodges  81 y.o. male  PRE-OPERATIVE DIAGNOSIS:  right hip fracture  POST-OPERATIVE DIAGNOSIS:  right hip fracture  PROCEDURE:  Procedure(s): PARTIAL RIGHT HIP REPLACEMENT (Right)  Size 7 femur [place] basic stem press-fit Size 50 head bipolar Size +5 neck length  Surgeon was [employer] assisted by Simonne Maffucci and [facility]  Spinal anesthetic   As many blood loss [id number] mL  Complications none Specimens none   ANESTHESIA:   spinal  EBL:  Total I/O In: 1100 [I.V.:1100] Out: 275 [Urine:175; Blood:100]  BLOOD ADMINISTERED:none  DRAINS: none   LOCAL MEDICATIONS USED:  MARCAINE     SPECIMEN:  No Specimen  DISPOSITION OF SPECIMEN:  N/A  COUNTS:  YES  TOURNIQUET:  * No tourniquets in log *  DICTATION: .Dragon Dictation  PLAN OF CARE: Admit to inpatient   PATIENT DISPOSITION:  PACU - hemodynamically stable.   Delay start of Pharmacological VTE agent (>24hrs) due to surgical blood loss or risk of bleeding: yes   [name] was identified in the preop area the right hip was confirmed and marked as a surgical site. He was taken to the operating room. Spinal anesthetic. A Foley catheter was inserted. He is placed in lateral decubitus position with the right side up axillary roll in the left axilla. After sterile prep and drape timeout was completed. I checked the implants prior to turning the patient back to the room and all implants were available. X-rays were reviewed.  After timeout a lateral approach to the hip was performed through a direct lateral incision over the greater trochanter. This was extended proximally and distally and down to the fascia. The fascia was split in line with the skin incision.  The findings were as follows part of the abductors were torn The femoral neck fracture was oblique  the acetabulum was without arthritis  Because the abductors were partially torn I made the abductor split  approximately 50% of the musculature including down to the vastus lateralis and including it as one continuous flap. This was retracted anteriorly. The capsule was removed. The femoral neck was removed with the head obliquely displaced. Acetabulum was inspected found to be clean and without any significant degenerative change  The femoral head was removed and measured a size 50  The hip was dislocated anteriorly and a femoral neck cut was made with the femoral neck cutting guide after identifying the lesser trochanter.  The box osteotome was used to prepare the proximal femur followed by the pilot hole drill canal finder and trochanteric reamer  Serial broaching was performed starting with a size 1 up to a size 7 which gave an excellent fit by press-fit.  Trial reduction was performed with a 1.5 and a +5 neck. The +5 gave the best leg length restoration, shuck test, sleep test, flexion internal rotation and extension external rotation. After thorough irrigation was performed with the hip dislocated. The trial reduction implants were removed  3 drill holes were placed in the proximal femur 2 #5 sutures were passed the implant was passed. The vastus lateralis abductor flap was closed with the #5 sutures passed through bone and oversewn with #1 Bralon suture starting in the vastus lateralis and carrying it proximally. Subcutaneous tissue and deep soft tissues were injected with Marcaine with epinephrine. The hip was abducted and the fascia was closed with #1 Bralon  Subcutaneous tissue closed with 0 Monocryl.  Skin staples used to reapproximate skin  With the patient in the supine position on his bed the leg lengths were measured and were equal and an abduction pillow was placed in a postop film was taken  Postop plan full weightbearing as tolerated  Aspirin for one month for DVT prevention

## 2014-12-06 NOTE — Anesthesia Procedure Notes (Addendum)
Spinal Patient location during procedure: OR Start time: 12/06/2014 12:32 PM Staffing Resident/CRNA: Tressie Stalker E Performed by: resident/CRNA  Preanesthetic Checklist Completed: patient identified, site marked, surgical consent, pre-op evaluation, timeout performed, IV checked, risks and benefits discussed and monitors and equipment checked Spinal Block Patient position: right lateral decubitus Prep: Betadine Patient monitoring: heart rate, cardiac monitor, continuous pulse ox and blood pressure Approach: right paramedian Location: L3-4 Injection technique: single-shot Needle Needle type: Spinocan  Needle gauge: 22 G Needle length: 9 cm Assessment Sensory level: T8 Additional Notes ATTEMPTS:1 TRAY [id number] TRAY EXPIRATION DATE:08/2015

## 2014-12-06 NOTE — Anesthesia Preprocedure Evaluation (Signed)
Anesthesia Evaluation  Patient identified by MRN, date of birth, ID band Patient awake    Reviewed: Allergy & Precautions, H&P , NPO status , Patient's Chart, lab work & pertinent test results, reviewed documented beta blocker date and time   History of Anesthesia Complications Negative for: history of anesthetic complications  Airway Mallampati: II  TM Distance: >3 FB     Dental  (+) Teeth Intact, Implants   Pulmonary neg pulmonary ROS, former smoker,    breath sounds clear to auscultation       Cardiovascular hypertension, Pt. on medications  Rhythm:Regular Rate:Normal     Neuro/Psych  Neuromuscular disease    GI/Hepatic negative GI ROS,   Endo/Other    Renal/GU      Musculoskeletal   Abdominal   Peds  Hematology   Anesthesia Other Findings   Reproductive/Obstetrics                             Anesthesia Physical Anesthesia Plan  ASA: III  Anesthesia Plan: Spinal   Post-op Pain Management:    Induction: Intravenous  Airway Management Planned: Simple Face Mask  Additional Equipment:   Intra-op Plan:   Post-operative Plan:   Informed Consent: I have reviewed the patients History and Physical, chart, labs and discussed the procedure including the risks, benefits and alternatives for the proposed anesthesia with the patient or authorized representative who has indicated his/her understanding and acceptance.     Plan Discussed with:   Anesthesia Plan Comments:         Anesthesia Quick Evaluation

## 2014-12-06 NOTE — Op Note (Signed)
12/05/2014 - 12/06/2014  2:34 PM  PATIENT:  Dominic Hodges  81 y.o. male  PRE-OPERATIVE DIAGNOSIS:  right hip fracture  POST-OPERATIVE DIAGNOSIS:  right hip fracture  PROCEDURE:  Procedure(s): PARTIAL RIGHT HIP REPLACEMENT (Right)  Size 7 femur Summit basic stem press-fit Size 50 head bipolar Size +5 neck length  Surgeon was [employer] assisted by Simonne Maffucci and [facility]  Spinal anesthetic   As many blood loss [id number] mL  Complications none Specimens none   ANESTHESIA:   spinal  EBL:  Total I/O In: 1100 [I.V.:1100] Out: 275 [Urine:175; Blood:100]  BLOOD ADMINISTERED:none  DRAINS: none   LOCAL MEDICATIONS USED:  MARCAINE     SPECIMEN:  No Specimen  DISPOSITION OF SPECIMEN:  N/A  COUNTS:  YES  TOURNIQUET:  * No tourniquets in log *  DICTATION: .Dragon Dictation  PLAN OF CARE: Admit to inpatient   PATIENT DISPOSITION:  PACU - hemodynamically stable.   Delay start of Pharmacological VTE agent (>24hrs) due to surgical blood loss or risk of bleeding: yes   [name] was identified in the preop area the right hip was confirmed and marked as a surgical site. He was taken to the operating room. Spinal anesthetic. A Foley catheter was inserted. He is placed in lateral decubitus position with the right side up axillary roll in the left axilla. After sterile prep and drape timeout was completed. I checked the implants prior to turning the patient back to the room and all implants were available. X-rays were reviewed.  After timeout a lateral approach to the hip was performed through a direct lateral incision over the greater trochanter. This was extended proximally and distally and down to the fascia. The fascia was split in line with the skin incision.  The findings were as follows part of the abductors were torn The femoral neck fracture was oblique  the acetabulum was without arthritis  Because the abductors were partially torn I made the abductor split  approximately 50% of the musculature including down to the vastus lateralis and including it as one continuous flap. This was retracted anteriorly. The capsule was removed. The femoral neck was removed with the head obliquely displaced. Acetabulum was inspected found to be clean and without any significant degenerative change  The femoral head was removed and measured a size 50  The hip was dislocated anteriorly and a femoral neck cut was made with the femoral neck cutting guide after identifying the lesser trochanter.  The box osteotome was used to prepare the proximal femur followed by the pilot hole drill canal finder and trochanteric reamer  Serial broaching was performed starting with a size 1 up to a size 7 which gave an excellent fit by press-fit.  Trial reduction was performed with a 1.5 and a +5 neck. The +5 gave the best leg length restoration, shuck test, sleep test, flexion internal rotation and extension external rotation. After thorough irrigation was performed with the hip dislocated. The trial reduction implants were removed  3 drill holes were placed in the proximal femur 2 #5 sutures were passed the implant was passed. The vastus lateralis abductor flap was closed with the #5 sutures passed through bone and oversewn with #1 Bralon suture starting in the vastus lateralis and carrying it proximally. Subcutaneous tissue and deep soft tissues were injected with Marcaine with epinephrine. The hip was abducted and the fascia was closed with #1 Bralon  Subcutaneous tissue closed with 0 Monocryl.  Skin staples used to reapproximate skin  With the patient in the supine position on his bed the leg lengths were measured and were equal and an abduction pillow was placed in a postop film was taken  Postop plan full weightbearing as tolerated  Aspirin for one month for DVT prevention

## 2014-12-06 NOTE — Consult Note (Signed)
HOSPITAL CONSULT [phone] (HIP FRACTURE )  MDM= MODERATE COMPLEXITY COMP HISTORY COMP EXAM  Patient ID: Dominic Hodges, male   DOB: [birth year], 82 y.o.   MRN: [id number]  New patient   Chief Complaint  Patient presents with  . Fall     [address] [place]va Pennino is a 81 y.o. male.   Fall Pertinent negatives include no fever or headaches.   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  HENT: Negative for sore throat.   Eyes: Negative for blurred vision.  Respiratory: Negative for cough.   Cardiovascular: Negative for chest pain.  Gastrointestinal: Negative for heartburn.  Genitourinary: Negative for dysuria.  Musculoskeletal: Positive for back pain and joint pain. Negative for myalgias.  Skin: Negative for rash.  Neurological: Negative for dizziness and headaches.  Endo/Heme/Allergies: Does not bruise/bleed easily.  Psychiatric/Behavioral: Negative for depression.    Past Medical History  Diagnosis Date  . Hypertension   . Hyperlipidemia   . BPH (benign prostatic hyperplasia)     Past Surgical History  Procedure Laterality Date  . Appendectomy      approx 30 years ago/lwb  . Umbilical hernia repair      Approx 10 years ago/lwb  . Ureteral stone extraction    . Cataract extraction w/phaco Left 09/11/2012    Procedure: CATARACT EXTRACTION PHACO AND INTRAOCULAR LENS PLACEMENT (IOC);  Surgeon: Tonny Branch, MD;  Location: [facility];  Service: Ophthalmology;  Laterality: Left;  CDE: 19.52     Family History  Problem Relation Age of Onset  . Coronary artery disease      no family history of premature CAD     Social History Social History  Substance Use Topics  . Smoking status: Former Smoker    Types: Pipe  . Smokeless tobacco: None  . Alcohol Use: Yes     Comment: occasional  social drink/lwb     No Known Allergies  Current Facility-Administered Medications  Medication Dose Route Frequency Provider Last Rate Last Dose  . 0.9 %  sodium chloride infusion   Intravenous Continuous Francine Graven, DO 75 mL/hr at 12/05/14 1924    . 0.9 %  sodium chloride infusion   Intravenous STAT Francine Graven, DO      . 0.9 %  sodium chloride infusion   Intravenous Continuous Theressa Millard, MD 50 mL/hr at 12/05/14 2253    . acetaminophen (TYLENOL) tablet 650 mg  650 mg Oral Q6H PRN Theressa Millard, MD       Or  . acetaminophen (TYLENOL) suppository 650 mg  650 mg Rectal Q6H PRN Theressa Millard, MD      . alum & mag hydroxide-simeth (MAALOX/MYLANTA) 200-200-20 MG/5ML suspension 30 mL  30 mL Oral Q6H PRN Theressa Millard, MD      . ceFAZolin (ANCEF) IVPB 2 g/50 mL premix  2 g Intravenous On Call to [place], MD   2 g at 12/06/14 0830  . [START ON 12/08/2014] cloNIDine (CATAPRES - Dosed in mg/24 hr) patch 0.3 mg  0.3 mg Transdermal Weekly Theressa Millard, MD      . cloNIDine (CATAPRES) tablet 0.1 mg  0.1 mg Oral QHS Theressa Millard, MD   0.1 mg at 12/05/14 2252  . dutasteride (AVODART) capsule 0.5  mg  0.5 mg Oral Daily Theressa Millard, MD   0.5 mg at 12/05/14 2252  . hydrALAZINE (APRESOLINE) tablet 50 mg  50 mg Oral BID Theressa Millard, MD   50 mg at 12/05/14 2253  . HYDROmorphone (DILAUDID) injection 0.5-1 mg  0.5-1 mg Intravenous Q3H PRN Theressa Millard, MD   1 mg at 12/06/14 [id number]  . morphine 4 MG/ML injection 4 mg  4 mg Intravenous Q1H PRN Francine Graven, DO   4 mg at 12/05/14 2013  . nebivolol (BYSTOLIC) tablet 20 mg  20 mg Oral Daily Theressa Millard, MD   20 mg at 12/05/14 2252  . ondansetron (ZOFRAN) tablet 4 mg  4 mg Oral Q6H PRN Theressa Millard, MD       Or  . ondansetron (ZOFRAN) injection 4 mg  4 mg Intravenous Q6H PRN Theressa Millard, MD      . rosuvastatin (CRESTOR) tablet 20 mg  20 mg Oral q1800 Theressa Millard,  MD   20 mg at 12/05/14 2253     Physical Exam(=30) Blood pressure 116/48, pulse 59, temperature 98.3 F (36.8 C), temperature source Oral, resp. rate 18, height 6\' 1"  (1.854 m), weight 166 lb 11.2 oz (75.615 kg), SpO2 96 %. Gen. Appearance normal Peripheral vascular system 2+ pulses and normal capillary refill in all 4 extremities Lymph nodes groin area negative Gait confined to bed after fall   Upper extremities  Inspection revealed no malalignment or asymmetry  Assessment of range of motion: Full range of motion was recorded  Assessment of stability: Elbow wrist and hand and shoulder were stable  Assessment of muscle strength and tone revealed grade 5 muscle strength and normal muscle tone  Skin was normal without rash lesion or ulceration on the left but abrasion noted right elbow  Lower extremities left  Inspection revealed no malalignment or asymmetry  Assessment of range of motion: Full range of motion was recorded  Assessment of stability: Elbow wrist and hand and shoulder were stable  Assessment of muscle strength and tone revealed grade 5 muscle strength and normal muscle tone  Skin was normal without rash lesion or ulceration  Right Short, ext rot, tender right hip rom ankle normal, hip deferred Stability in hip deferred due to fracture knee and ankle normal Muscle tone normal Skin normal   Coordination was tested by finger-to-nose nose and was normal Deep tendon reflexes were 2+ in the upper extremities and 2+ in the lower extremities Examination of sensation by touch was normal  Mental status  Oriented to time person and place normal  Mood and affect normal without depression anxiety or agitation  Dx:   Data Reviewed  I reviewed the images and the reports and my independent interpretation is This is a displaced right femoral neck fracture with no visible osteoarthritis in the joint   Assessment  Closed right femoral neck fracture, 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

## 2014-12-06 NOTE — Progress Notes (Signed)
Subjective: He was admitted last night with a hip fracture after falling in his yard. His pain is well controlled. At baseline he has hypertension and chronic renal failure  Objective: Vital signs in last 24 hours: Temp:  [98 F (36.7 C)-98.7 F (37.1 C)] 98.3 F (36.8 C) (11/28 0520) Pulse Rate:  [59-85] 59 (11/28 0520) Resp:  [14-21] 18 (11/28 0520) BP: (67-145)/(47-69) 116/48 mmHg (11/28 0520) SpO2:  [88 %-96 %] 96 % (11/28 0520) FiO2 (%):  [28 %] 28 % (11/27 2058) Weight:  [75.615 kg (166 lb 11.2 oz)-77.111 kg (170 lb)] 75.615 kg (166 lb 11.2 oz) (11/27 2108) Weight change:  Last BM Date: 12/05/14  Intake/Output from previous day: 11/27 0701 - 11/28 0700 In: 403.3 [I.V.:403.3] Out: 650 [Urine:650]  PHYSICAL EXAM General appearance: alert, cooperative and mild distress Resp: clear to auscultation bilaterally Cardio: regular rate and rhythm, S1, S2 normal, no murmur, click, rub or gallop GI: soft, non-tender; bowel sounds normal; no masses,  no organomegaly Extremities: His right hip is immobilized  Lab Results:  Results for orders placed or performed during the hospital encounter of 12/05/14 (from the past 48 hour(s))  Basic metabolic panel     Status: Abnormal   Collection Time: 12/05/14  5:50 PM  Result Value Ref Range   Sodium 142 135 - 145 mmol/L   Potassium 4.4 3.5 - 5.1 mmol/L   Chloride 108 101 - 111 mmol/L   CO2 23 22 - 32 mmol/L   Glucose, Bld 118 (H) 65 - 99 mg/dL   BUN 36 (H) 6 - 20 mg/dL   Creatinine, Ser 1.65 (H) 0.61 - 1.24 mg/dL   Calcium 8.8 (L) 8.9 - 10.3 mg/dL   GFR calc non Af Amer 36 (L) >60 mL/min   GFR calc Af Amer 42 (L) >60 mL/min    Comment: (NOTE) The eGFR has been calculated using the CKD EPI equation. This calculation has not been validated in all clinical situations. eGFR's persistently <60 mL/min signify possible Chronic Kidney Disease.    Anion gap 11 5 - 15  CBC with Differential     Status: Abnormal   Collection Time: 12/05/14   5:50 PM  Result Value Ref Range   WBC 13.3 (H) 4.0 - 10.5 K/uL   RBC 4.10 (L) 4.22 - 5.81 MIL/uL   Hemoglobin 12.8 (L) 13.0 - 17.0 g/dL   HCT 37.1 (L) 39.0 - 52.0 %   MCV 90.5 78.0 - 100.0 fL   MCH 31.2 26.0 - 34.0 pg   MCHC 34.5 30.0 - 36.0 g/dL   RDW 13.0 11.5 - 15.5 %   Platelets 179 150 - 400 K/uL   Neutrophils Relative % 86 %   Neutro Abs 11.4 (H) 1.7 - 7.7 K/uL   Lymphocytes Relative 8 %   Lymphs Abs 1.1 0.7 - 4.0 K/uL   Monocytes Relative 6 %   Monocytes Absolute 0.8 0.1 - 1.0 K/uL   Eosinophils Relative 0 %   Eosinophils Absolute 0.0 0.0 - 0.7 K/uL   Basophils Relative 0 %   Basophils Absolute 0.0 0.0 - 0.1 K/uL  Protime-INR     Status: None   Collection Time: 12/05/14  5:50 PM  Result Value Ref Range   Prothrombin Time 14.9 11.6 - 15.2 seconds   INR 1.15 0.00 - 1.49  Troponin I     Status: None   Collection Time: 12/05/14  5:50 PM  Result Value Ref Range   Troponin I <0.03 <0.031 ng/mL  Comment:        NO INDICATION OF MYOCARDIAL INJURY.   Surgical pcr screen     Status: None   Collection Time: 12/05/14  9:41 PM  Result Value Ref Range   MRSA, PCR NEGATIVE NEGATIVE   Staphylococcus aureus NEGATIVE NEGATIVE    Comment:        The Xpert SA Assay (FDA approved for NASAL specimens in patients over 58 years of age), is one component of a comprehensive surveillance program.  Test performance has been validated by [facility] for patients greater than or equal to 87 year old. It is not intended to diagnose infection nor to guide or monitor treatment.   Basic metabolic panel     Status: Abnormal   Collection Time: 12/06/14  5:55 AM  Result Value Ref Range   Sodium 142 135 - 145 mmol/L   Potassium 4.3 3.5 - 5.1 mmol/L   Chloride 111 101 - 111 mmol/L   CO2 27 22 - 32 mmol/L   Glucose, Bld 111 (H) 65 - 99 mg/dL   BUN 31 (H) 6 - 20 mg/dL   Creatinine, Ser 1.46 (H) 0.61 - 1.24 mg/dL   Calcium 8.4 (L) 8.9 - 10.3 mg/dL   GFR calc non Af Amer 42 (L) >60  mL/min   GFR calc Af Amer 49 (L) >60 mL/min    Comment: (NOTE) The eGFR has been calculated using the CKD EPI equation. This calculation has not been validated in all clinical situations. eGFR's persistently <60 mL/min signify possible Chronic Kidney Disease.    Anion gap 4 (L) 5 - 15  CBC     Status: Abnormal   Collection Time: 12/06/14  5:55 AM  Result Value Ref Range   WBC 9.7 4.0 - 10.5 K/uL   RBC 3.89 (L) 4.22 - 5.81 MIL/uL   Hemoglobin 11.9 (L) 13.0 - 17.0 g/dL   HCT 35.6 (L) 39.0 - 52.0 %   MCV 91.5 78.0 - 100.0 fL   MCH 30.6 26.0 - 34.0 pg   MCHC 33.4 30.0 - 36.0 g/dL   RDW 13.0 11.5 - 15.5 %   Platelets 168 150 - 400 K/uL    ABGS No results for input(s): PHART, PO2ART, TCO2, HCO3 in the last 72 hours.  Invalid input(s): PCO2 CULTURES Recent Results (from the past 240 hour(s))  Surgical pcr screen     Status: None   Collection Time: 12/05/14  9:41 PM  Result Value Ref Range Status   MRSA, PCR NEGATIVE NEGATIVE Final   Staphylococcus aureus NEGATIVE NEGATIVE Final    Comment:        The Xpert SA Assay (FDA approved for NASAL specimens in patients over 31 years of age), is one component of a comprehensive surveillance program.  Test performance has been validated by [facility] for patients greater than or equal to 66 year old. It is not intended to diagnose infection nor to guide or monitor treatment.    Studies/Results: Dg Chest 1 View  12/05/2014  CLINICAL DATA:  Fall doing yard work.  Hip pain. EXAM: CHEST  1 VIEW COMPARISON:  Two-view chest x-ray 07/17/2014. FINDINGS: The heart size is normal. The lungs are clear. The visualized soft tissues and bony thorax are unremarkable. IMPRESSION: Negative one-view chest x-ray Electronically Signed   By: San Morelle M.D.   On: 12/05/2014 18:16   Dg Hip Unilat With Pelvis 2-3 Views Right  12/05/2014  CLINICAL DATA:  Status post fall while doing yard work, with  right hip pain. Initial encounter. EXAM: DG  HIP (WITH OR WITHOUT PELVIS) 2-3V RIGHT COMPARISON:  None. FINDINGS: There is a mildly displaced subcapital fracture through the right femoral neck. The right femoral head remains seated at the acetabulum. There is mild superior displacement of the distal femur. The left hip joint is grossly unremarkable. No significant degenerative change is appreciated. The sacroiliac joints are unremarkable in appearance. The visualized bowel gas pattern is grossly unremarkable in appearance. IMPRESSION: Mildly displaced subcapital fracture through the right femoral neck. Electronically Signed   By: Garald Balding M.D.   On: 12/05/2014 18:18    Medications:  Prior to Admission:  Prescriptions prior to admission  Medication Sig Dispense Refill Last Dose  . aspirin 81 MG EC tablet Take 81 mg by mouth daily.     12/05/2014 at Unknown time  . Azilsartan Medoxomil (EDARBI) 40 MG TABS Take 1 tablet by mouth daily.   12/04/2014 at Unknown time  . cloNIDine (CATAPRES - DOSED IN MG/24 HR) 0.3 mg/24hr patch Place 0.3 mg onto the skin once a week.   1 12/01/2014 at Unknown time  . cloNIDine (CATAPRES) 0.1 MG tablet Take 0.1 mg by mouth at bedtime.    12/04/2014 at Unknown time  . dutasteride (AVODART) 0.5 MG capsule Take 0.5 mg by mouth daily.    12/04/2014 at Unknown time  . hydrALAZINE (APRESOLINE) 50 MG tablet Take 50 mg by mouth 2 (two) times daily.    12/05/2014 at Unknown time  . Nebivolol HCl (BYSTOLIC) 20 MG TABS Take 20 mg by mouth daily.   12/04/2014 at Unknown time  . rosuvastatin (CRESTOR) 20 MG tablet Take 20 mg by mouth daily.     12/04/2014 at Unknown time  . azithromycin (ZITHROMAX) 250 MG tablet Starting on 11/21/2014, take two tablets on day 1 then take 1 tablet on days 2 through 5  0 Completed Course at Unknown time  . cephALEXin (KEFLEX) 500 MG capsule Take 500 mg by mouth 3 (three) times daily. 10 day course starting on 11/18/2014  0 Completed Course at Unknown time   Scheduled: . sodium chloride    Intravenous STAT  .  ceFAZolin (ANCEF) IV  2 g Intravenous On Call to OR  . [START ON 12/08/2014] cloNIDine  0.3 mg Transdermal Weekly  . cloNIDine  0.1 mg Oral QHS  . dutasteride  0.5 mg Oral Daily  . hydrALAZINE  50 mg Oral BID  . nebivolol  20 mg Oral Daily  . rosuvastatin  20 mg Oral q1800   Continuous: . sodium chloride 75 mL/hr at 12/05/14 1924  . sodium chloride 50 mL/hr at 12/05/14 2253   VBT:[id number] **OR** acetaminophen, alum & mag hydroxide-simeth, HYDROmorphone (DILAUDID) injection, morphine injection, ondansetron **OR** ondansetron (ZOFRAN) IV  Assesment: He has a closed right hip fracture. He is scheduled for surgery today. There are no contraindications to planned surgery. Principal Problem:   Closed right hip fracture (HCC) Active Problems:   Closed hip fracture (HCC)   Hypertension   BPH (benign prostatic hyperplasia)   Hyperlipidemia    Plan: For hip repair today    LOS: 1 day   Tristin Gladman L 12/06/2014, 9:18 AM

## 2014-12-06 NOTE — Op Note (Signed)
12/05/2014 - 12/06/2014  2:34 PM  PATIENT:  Dominic Hodges  81 y.o. male  PRE-OPERATIVE DIAGNOSIS:  right hip fracture  POST-OPERATIVE DIAGNOSIS:  right hip fracture  PROCEDURE:  Procedure(s): PARTIAL RIGHT HIP REPLACEMENT (Right)  Size 7 femur Summit basic stem press-fit Size 50 head bipolar Size +5 neck length  Surgeon was [employer] assisted by Simonne Maffucci and [facility]  Spinal anesthetic   As many blood loss [id number] mL  Complications none Specimens none   ANESTHESIA:   spinal  EBL:  Total I/O In: 1100 [I.V.:1100] Out: 275 [Urine:175; Blood:100]  BLOOD ADMINISTERED:none  DRAINS: none   LOCAL MEDICATIONS USED:  MARCAINE     SPECIMEN:  No Specimen  DISPOSITION OF SPECIMEN:  N/A  COUNTS:  YES  TOURNIQUET:  * No tourniquets in log *  DICTATION: .Dragon Dictation  PLAN OF CARE: Admit to inpatient   PATIENT DISPOSITION:  PACU - hemodynamically stable.   Delay start of Pharmacological VTE agent (>24hrs) due to surgical blood loss or risk of bleeding: yes   Dr. [name] was identified in the preop area the right hip was confirmed and marked as a surgical site. He was taken to the operating room. Spinal anesthetic. A Foley catheter was inserted. He is placed in lateral decubitus position with the right side up axillary roll in the left axilla. After sterile prep and drape timeout was completed. I checked the implants prior to turning the patient back to the room and all implants were available. X-rays were reviewed.  After timeout a lateral approach to the hip was performed through a direct lateral incision over the greater trochanter. This was extended proximally and distally and down to the fascia. The fascia was split in line with the skin incision.  The findings were as follows part of the abductors were torn The femoral neck fracture was oblique  the acetabulum was without arthritis  Because the abductors were partially torn I made the abductor split  approximately 50% of the musculature including down to the vastus lateralis and including it as one continuous flap. This was retracted anteriorly. The capsule was removed. The femoral neck was removed with the head obliquely displaced. Acetabulum was inspected found to be clean and without any significant degenerative change  The femoral head was removed and measured a size 50  The hip was dislocated anteriorly and a femoral neck cut was made with the femoral neck cutting guide after identifying the lesser trochanter.  The box osteotome was used to prepare the proximal femur followed by the pilot hole drill canal finder and trochanteric reamer  Serial broaching was performed starting with a size 1 up to a size 7 which gave an excellent fit by press-fit.  Trial reduction was performed with a 1.5 and a +5 neck. The +5 gave the best leg length restoration, shuck test, sleep test, flexion internal rotation and extension external rotation. After thorough irrigation was performed with the hip dislocated. The trial reduction implants were removed  3 drill holes were placed in the proximal femur 2 #5 sutures were passed the implant was passed. The vastus lateralis abductor flap was closed with the #5 sutures passed through bone and oversewn with #1 Bralon suture starting in the vastus lateralis and carrying it proximally. Subcutaneous tissue and deep soft tissues were injected with Marcaine with epinephrine. The hip was abducted and the fascia was closed with #1 Bralon  Subcutaneous tissue closed with 0 Monocryl.  Skin staples used to reapproximate skin  With the patient in the supine position on his bed the leg lengths were measured and were equal and an abduction pillow was placed in a postop film was taken  Postop plan full weightbearing as tolerated  Aspirin for one month for DVT prevention

## 2014-12-06 NOTE — Transfer of Care (Signed)
Immediate Anesthesia Transfer of Care Note  Patient: Dominic Hodges  Procedure(s) Performed: Procedure(s): PARTIAL RIGHT HIP REPLACEMENT (Right)  Patient Location: PACU  Anesthesia Type:Spinal  Level of Consciousness: awake, alert  and oriented  Airway & Oxygen Therapy: Patient Spontanous Breathing and Patient connected to nasal cannula oxygen  Post-op Assessment: Report given to RN  Post vital signs: Reviewed and stable  Last Vitals:  Filed Vitals:   12/06/14 1115 12/06/14 1425  BP: 102/49 119/50  Pulse:  79  Temp:  36.7 C  Resp: 15 17    Complications: No apparent anesthesia complications

## 2014-12-06 NOTE — Care Management Note (Signed)
Case Management Note  Patient Details  Name: Dominic Hodges MRN: [id number] Date of Birth: [birth year]  Subjective/Objective:                  Pt from home, admitted after falling and fx hip. Pt ind with ADL's at baseline.   Action/Plan: Surgery performed today. Anticipate need to SNF at [place] is aware and PT eval pending. No CM needs anticipated.   Expected Discharge Date:    12/08/2014              Expected Discharge Plan:  Skilled Nursing Facility  In-House Referral:  Clinical Social Work  Discharge planning Services  CM Consult  Post Acute Care Choice:  NA Choice offered to:  NA  DME Arranged:    DME Agency:     HH Arranged:    [place] Agency:     Status of Service:  Completed, signed off  Medicare Important Message Given:    Date Medicare IM Given:    Medicare IM give by:    Date Additional Medicare IM Given:    Additional Medicare Important Message give by:     If discussed at [place] of Stay Meetings, dates discussed:    Additional Comments:  Sherald Barge, RN 12/06/2014, 4:17 PM

## 2014-12-06 NOTE — Anesthesia Postprocedure Evaluation (Signed)
Anesthesia Post Note  Patient: Dominic Hodges  Procedure(s) Performed: Procedure(s) (LRB): PARTIAL RIGHT HIP REPLACEMENT (Right)  Patient location during evaluation: PACU Anesthesia Type: Spinal Level of consciousness: awake and alert Pain management: pain level controlled Vital Signs Assessment: post-procedure vital signs reviewed and stable Respiratory status: spontaneous breathing and patient connected to nasal cannula oxygen Cardiovascular status: blood pressure returned to baseline and stable Postop Assessment: No headache, No backache, Spinal receding and No signs of nausea or vomiting Anesthetic complications: no    Last Vitals:  Filed Vitals:   12/06/14 1430 12/06/14 1445  BP: 119/53 127/51  Pulse: 65 62  Temp:    Resp: 15 15    Last Pain:  Filed Vitals:   12/06/14 1502  PainSc: 0-No pain    LLE Motor Response: No movement due to regional block LLE Sensation: Numbness RLE Motor Response: No movement due to regional block RLE Sensation: Numbness L Sensory Level: T10-Umbilical region R Sensory Level: T10-Umbilical region  [facility]

## 2014-12-07 ENCOUNTER — Encounter (HOSPITAL_COMMUNITY): Payer: Self-pay | Admitting: Orthopedic Surgery

## 2014-12-07 LAB — BASIC METABOLIC PANEL
ANION GAP: 5 (ref 5–15)
BUN: 27 mg/dL — ABNORMAL HIGH (ref 6–20)
CO2: 26 mmol/L (ref 22–32)
Calcium: 8 mg/dL — ABNORMAL LOW (ref 8.9–10.3)
Chloride: 107 mmol/L (ref 101–111)
Creatinine, Ser: 1.49 mg/dL — ABNORMAL HIGH (ref 0.61–1.24)
GFR calc Af Amer: 48 mL/min — ABNORMAL LOW (ref >60)
GFR, EST NON AFRICAN AMERICAN: 41 mL/min — AB (ref >60)
GLUCOSE: 100 mg/dL — AB (ref 65–99)
POTASSIUM: 4.6 mmol/L (ref 3.5–5.1)
Sodium: 138 mmol/L (ref 135–145)

## 2014-12-07 LAB — URINALYSIS, ROUTINE W REFLEX MICROSCOPIC
BILIRUBIN URINE: NEGATIVE
Glucose, UA: NEGATIVE mg/dL
KETONES UR: NEGATIVE mg/dL
LEUKOCYTES UA: NEGATIVE
NITRITE: NEGATIVE
Protein, ur: 30 mg/dL — AB
Specific Gravity, Urine: 1.025 (ref 1.005–1.030)
pH: 5.5 (ref 5.0–8.0)

## 2014-12-07 LAB — URINE MICROSCOPIC-ADD ON
Squamous Epithelial / LPF: NONE SEEN
WBC, UA: NONE SEEN WBC/hpf (ref 0–5)

## 2014-12-07 LAB — CBC
HEMATOCRIT: 31.7 % — AB (ref 39.0–52.0)
Hemoglobin: 10.5 g/dL — ABNORMAL LOW (ref 13.0–17.0)
MCH: 30.5 pg (ref 26.0–34.0)
MCHC: 33.1 g/dL (ref 30.0–36.0)
MCV: 92.2 fL (ref 78.0–100.0)
PLATELETS: 137 10*3/uL — AB (ref 150–400)
RBC: 3.44 MIL/uL — AB (ref 4.22–5.81)
RDW: 13 % (ref 11.5–15.5)
WBC: 9.1 10*3/uL (ref 4.0–10.5)

## 2014-12-07 MED ORDER — POLYETHYLENE GLYCOL 3350 17 G PO PACK
17.0000 g | PACK | Freq: Every day | ORAL | Status: DC
  Administered 2014-12-07: 17 g via ORAL
  Filled 2014-12-07 (×2): qty 1

## 2014-12-07 MED ORDER — MORPHINE SULFATE (PF) 2 MG/ML IV SOLN
2.0000 mg | INTRAVENOUS | Status: DC | PRN
  Administered 2014-12-08: 2 mg via INTRAVENOUS
  Filled 2014-12-07: qty 1

## 2014-12-07 MED ORDER — PHENAZOPYRIDINE HCL 100 MG PO TABS
100.0000 mg | ORAL_TABLET | Freq: Three times a day (TID) | ORAL | Status: DC
  Administered 2014-12-07: 100 mg via ORAL
  Filled 2014-12-07 (×3): qty 1

## 2014-12-07 MED ORDER — HYDROCODONE-ACETAMINOPHEN 5-325 MG PO TABS
1.0000 | ORAL_TABLET | ORAL | Status: DC | PRN
  Administered 2014-12-07 – 2014-12-08 (×3): 1 via ORAL
  Filled 2014-12-07 (×5): qty 1

## 2014-12-07 NOTE — Clinical Social Work Placement (Signed)
   CLINICAL SOCIAL WORK PLACEMENT  NOTE  Date:  12/07/2014  Patient Details  Name: Dominic Hodges MRN: [id number] Date of Birth: 09/26/1927  Clinical Social Work is seeking post-discharge placement for this patient at the [place] level of care (*CSW will initial, date and re-position this form in  chart as items are completed):  Yes   Patient/family provided with [facility] Work Department's list of facilities offering this level of care within the geographic area requested by the patient (or if unable, by the patient's family).  Yes   Patient/family informed of their freedom to choose among providers that offer the needed level of care, that participate in Medicare, Medicaid or managed care program needed by the patient, have an available bed and are willing to accept the patient.  Yes   Patient/family informed of [facility]'s ownership interest in [facility] and [facility], as well as of the fact that they are under no obligation to receive care at these facilities.  PASRR submitted to EDS on 12/07/14     PASRR number received on 12/07/14     Existing PASRR number confirmed on       FL2 transmitted to all facilities in geographic area requested by pt/family on 12/07/14     FL2 transmitted to all facilities within larger geographic area on       Patient informed that his/her managed care company has contracts with or will negotiate with certain facilities, including the following:            Patient/family informed of bed offers received.  Patient chooses bed at       Physician recommends and patient chooses bed at      Patient to be transferred to   on  .  Patient to be transferred to facility by       Patient family notified on   of transfer.  Name of family member notified:        PHYSICIAN       Additional Comment:    _______________________________________________ Salome Arnt, LCSW 12/07/2014,  1:10 PM [phone]

## 2014-12-07 NOTE — Progress Notes (Signed)
Postop day #1 status post bipolar hip replacement for right femoral neck fracture.  Hemoglobin is 11 at 6:00 last night.   BP 115/52 mmHg  Pulse 72  Temp(Src) 98.7 F (37.1 C) (Oral)  Resp 20  Ht 6\' 1"  (1.854 m)  Wt 166 lb 11.2 oz (75.615 kg)  BMI 22.00 kg/m2  SpO2 91%  BMP Latest Ref Rng 12/07/2014 12/06/2014 12/05/2014  Glucose 65 - 99 mg/dL 100(H) 111(H) 118(H)  BUN 6 - 20 mg/dL 27(H) 31(H) 36(H)  Creatinine 0.61 - 1.24 mg/dL 1.49(H) 1.46(H) 1.65(H)  Sodium 135 - 145 mmol/L 138 142 142  Potassium 3.5 - 5.1 mmol/L 4.6 4.3 4.4  Chloride 101 - 111 mmol/L 107 111 108  CO2 22 - 32 mmol/L 26 27 23   Calcium 8.9 - 10.3 mg/dL 8.0(L) 8.4(L) 8.8(L)    CBC Latest Ref Rng 12/07/2014 12/06/2014 12/06/2014  WBC 4.0 - 10.5 K/uL 9.1 - 9.7  Hemoglobin 13.0 - 17.0 g/dL 10.5(L) 11.3(L) 11.9(L)  Hematocrit 39.0 - 52.0 % 31.7(L) - 35.6(L)  Platelets 150 - 400 K/uL 137(L) - 168    Today we'll start physical therapy. Turn down the IV. Get him off of Dilaudid. Given a medicine for bowel function and use hydrocodone for pain. He also wants his foot pumps changed SCDs

## 2014-12-07 NOTE — Progress Notes (Signed)
Physical Therapy Treatment Patient Details Name: Dominic Hodges MRN: [id number] DOB: Jan 13, 1927 Today's Date: 12/07/2014    History of Present Illness HPI: Dominic Hodges is a 81 y.o. male with a history of HTN, Hyperlipidemia, and BPH who was in his yard doing yard work and he was pulling up a rosebush and fell back onto his right hip and had increased pain and could not bear weight on his right leg. He was taken to the ED and found on X-rays to have a closed Right Hip Fracture. Arrangements were made for admission and Orthopedic Dr Aline Brochure was consulted to see him in the AM for Repair. He complaints of 8/10 pain of his right hip if he moves.     PT Comments    Pt tolerated sitting in recliner for 2.5 hours but very fatigued.  He was instructed in transfer technique, stand pivot transfer, from chair to bed.  He required max assist.  Total assist x2 was required to transfer sit to supine.  He was instructed in THR precautions.  He was nauseated a bit earlier but this has resolved.  He was too tired to attempt any exercise.  I have spoken with his wife and daughter about his progress and have discussed my recommendation for him to go to SNF at d/c rather than to home.  Follow Up Recommendations  SNF     Equipment Recommendations  None recommended by PT (to be ordered at [facility])    Recommendations for Other Services OT consult     Precautions / Restrictions Precautions Precautions: Anterior Hip Precaution Comments: direct lateral approach Restrictions Weight Bearing Restrictions: No    Mobility  Bed Mobility Overal bed mobility: Needs Assistance Bed Mobility: Sit to Supine     Supine to sit: Total assist;HOB elevated Sit to supine: Total assist      Transfers Overall transfer level: Needs assistance Equipment used: None Transfers: Sit to/from Stand Sit to Stand: Max assist Stand pivot transfers: Max assist       General transfer comment: pt unable to  attain full stance due to LE weakness...Marland Kitchenno significant pain reported with mild weight bearing on RLE  Ambulation/Gait             General Gait Details: unable to ambulate at this time   Stairs            Wheelchair Mobility    Modified Rankin (Stroke Patients Only)       Balance Overall balance assessment: Needs assistance Sitting-balance support: Bilateral upper extremity supported;Feet supported Sitting balance-Leahy Scale: Fair Sitting balance - Comments: tends to lean backward, probably due to fear of falling                            Cognition Arousal/Alertness: Awake/alert Behavior During Therapy: WFL for tasks assessed/performed Overall Cognitive Status: Within Functional Limits for tasks assessed                      Exercises Total Joint Exercises Ankle Circles/Pumps: AROM;Both;10 reps;Supine Quad Sets: AROM;Both;5 reps;Supine Gluteal Sets: AROM;Both;5 reps;Supine Short Arc Quad: AAROM;Both;5 reps;Supine Heel Slides: AAROM;Both;5 reps;Supine    General Comments        Pertinent Vitals/Pain Pain Assessment: No/denies pain    Home Living Family/patient expects to be discharged to:: Skilled nursing facility Living Arrangements: Spouse/significant other  Prior Function Level of Independence: Independent          PT Goals (current goals can now be found in the care plan section) Acute Rehab PT Goals Patient Stated Goal: wants to go home PT Goal Formulation: With patient Time For Goal Achievement: 12/21/14 Potential to Achieve Goals: Good Progress towards PT goals: Progressing toward goals    Frequency  BID    PT Plan Current plan remains appropriate    Co-evaluation             End of Session Equipment Utilized During Treatment: Gait belt;Oxygen Activity Tolerance: Patient limited by fatigue Patient left: in bed;with call bell/phone within reach;with bed alarm set;with nursing/sitter  in room     Time: 1202-1226 PT Time Calculation (min) (ACUTE ONLY): 24 min  Charges:  $Therapeutic Exercise: 8-22 mins $Therapeutic Activity: 8-22 mins                    G CodesSable Feil  PT 12/07/2014, 12:32 PM [phone]

## 2014-12-07 NOTE — Evaluation (Signed)
Physical Therapy Evaluation Patient Details Name: Dominic Hodges MRN: [id number] DOB: December 20, 1927 Today's Date: 12/07/2014   History of Present Illness  HPI: Dominic Hodges is a 81 y.o. male with a history of HTN, Hyperlipidemia, and BPH who was in his yard doing yard work and he was pulling up a rosebush and fell back onto his right hip and had increased pain and could not bear weight on his right leg. He was taken to the ED and found on X-rays to have a closed Right Hip Fracture. Arrangements were made for admission and Orthopedic Dr Aline Brochure was consulted to see him in the AM for Repair. He complaints of 8/10 pain of his right hip if he moves. He underwent direct left lateral approach partial hip replacement on 12-06-14.  Clinical Impression   Pt was seen for initial evaluation/tx.  He was medicated prior to tx and became mildly drowsy during my visit which was somewhat limiting of his mobility.  He had no pain.  O2 sat was only 89-90% on 1.5 L O2 so O2 was increased to 2L/min.  He was instructed in direct lateral approach hip precautions and we will continue instruction.  He was able to tolerate gentle therapeutic exercise per protocol with no increased pain.  He required max to total assist to transfer from bed to chair.  He was unable to come to standing with a walker due to bilateral LE weakness.  I have recommended SNF at d/c but he states that he would prefer to go home.  I have explained the benefits of SNF vs HHPT.  He said he would consider his options.    Follow Up Recommendations SNF    Equipment Recommendations  None recommended by PT    Recommendations for Other Services   OT    Precautions / Restrictions Precautions Precautions: Anterior Hip Precaution Comments: direct lateral approach Restrictions Weight Bearing Restrictions: No      Mobility  Bed Mobility Overal bed mobility: Needs Assistance Bed Mobility: Supine to Sit     Supine to sit: Total  assist;HOB elevated        Transfers Overall transfer level: Needs assistance Equipment used: Rolling walker (2 wheeled);None Transfers: Sit to/from [facility] Sit to Stand: Total assist Stand pivot transfers: Max assist       General transfer comment: pt unable to attain full stance due to LE weakness...Marland Kitchenno significant pain reported with mild weight bearing on RLE  Ambulation/Gait             General Gait Details: unable to ambulate at this time  Stairs            Wheelchair Mobility    Modified Rankin (Stroke Patients Only)       Balance Overall balance assessment: Needs assistance Sitting-balance support: Bilateral upper extremity supported;Feet supported Sitting balance-Leahy Scale: Fair Sitting balance - Comments: tends to lean backward, probably due to fear of falling                                     Pertinent Vitals/Pain Pain Assessment: No/denies pain (at rest--was medicated prior to tx)    Home Living Family/patient expects to be discharged to:: Skilled nursing facility Living Arrangements: Spouse/significant other                    Prior Function Level of Independence: Independent  Hand Dominance        Extremity/Trunk Assessment               Lower Extremity Assessment: Generalized weakness;RLE deficits/detail RLE Deficits / Details: able to initiate hip flexion but otherwise is limited by recent surgery, as expected    Cervical / Trunk Assessment: Kyphotic  Communication   Communication: HOH  Cognition Arousal/Alertness: Lethargic Behavior During Therapy: WFL for tasks assessed/performed Overall Cognitive Status: Within Functional Limits for tasks assessed                      General Comments      Exercises Total Joint Exercises Ankle Circles/Pumps: AROM;Both;10 reps;Supine Quad Sets: AROM;Both;5 reps;Supine Gluteal Sets: AROM;Both;5  reps;Supine Short Arc Quad: AAROM;Both;5 reps;Supine Heel Slides: AAROM;Both;5 reps;Supine      Assessment/Plan    PT Assessment Patient needs continued PT services  PT Diagnosis Difficulty walking;Generalized weakness;Acute pain   PT Problem List Decreased strength;Decreased range of motion;Decreased activity tolerance;Decreased balance;Decreased mobility;Decreased knowledge of use of DME;Decreased safety awareness;Decreased knowledge of precautions;Pain  PT Treatment Interventions DME instruction;Gait training;Functional mobility training;Therapeutic exercise;Patient/family education   PT Goals (Current goals can be found in the Care Plan section) Acute Rehab PT Goals Patient Stated Goal: wants to go home PT Goal Formulation: With patient Time For Goal Achievement: 12/21/14 Potential to Achieve Goals: Good    Frequency BID   Barriers to discharge   none    Co-evaluation               End of Session Equipment Utilized During Treatment: Gait belt;Oxygen Activity Tolerance: Patient limited by fatigue Patient left: in chair;with call bell/phone within reach Nurse Communication: Mobility status         Time: [id number] PT Time Calculation (min) (ACUTE ONLY): 46 min   Charges:   PT Evaluation $Initial PT Evaluation Tier I: 1 Procedure PT Treatments $Therapeutic Exercise: 8-22 mins   PT G CodesSable Feil  PT 12/07/2014, 9:51 AM [phone]

## 2014-12-07 NOTE — Clinical Social Work Note (Signed)
Clinical Social Work Assessment  Patient Details  Name: Dominic Hodges MRN: [id number] Date of Birth: October 26, 1927  Date of referral:  12/07/14               Reason for consult:  Discharge Planning                Permission sought to share information with:    Permission granted to share information::     Name::        Agency::     Relationship::     Contact Information:     Housing/Transportation Living arrangements for the past 2 months:  Single Family Home Source of Information:  Patient, Adult Children, Spouse Patient Interpreter Needed:  None Criminal Activity/Legal Involvement Pertinent to Current Situation/Hospitalization:  No - Comment as needed Significant Relationships:  Adult Children, Spouse Lives with:  Spouse Do you feel safe going back to the place where you live?    Need for family participation in patient care:  No (Coment)  Care giving concerns:  Fractured hip.    Social Worker assessment / plan:  CSW met with pt and pt's wife and daughter at bedside. Pt alert and oriented. He was pulling up a rose bush at home and fell backwards, fracturing hip. Pt is post-op day 1 and has worked with PT with recommendation for SNF. Pt and family are aware of this and have had some time to process and discuss. 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 He appears to have accepted need for short term SNF prior to return home. CSW discussed placement process and provided SNF list. They are aware of Medicare coverage/criteria. Pt requests [place] only at this point.   Employment status:  Therapist, music:  Medicare PT Recommendations:  [place] / Referral to community resources:  [place]  Patient/Family's Response to care:  Pt was initially hoping to go home, but recognizes need for SNF until able to transfer independently at least.  Patient/Family's Understanding of and Emotional Response to  Diagnosis, Current Treatment, and Prognosis:  Pt and family aware of treatment plan. He admits that it will be difficult to be the patient now. Brief support provided.   Emotional Assessment Appearance:  Appears stated age Attitude/Demeanor/Rapport:  Other (Cooperative) Affect (typically observed):  Accepting Orientation:  Oriented to Self, Oriented to Place, Oriented to  Time, Oriented to Situation Alcohol / Substance use:  Not Applicable Psych involvement (Current and /or in the community):  No (Comment)  Discharge Needs  Concerns to be addressed:  Discharge Planning Concerns Readmission within the last 30 days:  No Current discharge risk:  Physical Impairment Barriers to Discharge:  Continued Medical Work up   Salome Arnt, [place] 12/07/2014, 1:15 PM [phone]

## 2014-12-07 NOTE — Progress Notes (Signed)
Subjective: He says he feels okay. He is moving his leg. He has some pain but only when he moves. He has no other new complaints.  Objective: Vital signs in last 24 hours: Temp:  [97.9 F (36.6 C)-98.7 F (37.1 C)] 98.7 F (37.1 C) (11/29 0655) Pulse Rate:  [57-79] 72 (11/29 0655) Resp:  [15-24] 20 (11/29 0655) BP: (102-141)/(42-66) 115/52 mmHg (11/29 0655) SpO2:  [91 %-98 %] 91 % (11/29 0655) Weight change:  Last BM Date: 12/04/14  Intake/Output from previous day: 11/28 0701 - 11/29 0700 In: 2260 [P.O.:60; I.V.:2100; IV Piggyback:100] Out: 1155 [Urine:955; Blood:200]  PHYSICAL EXAM General appearance: alert, cooperative and no distress Resp: clear to auscultation bilaterally Cardio: regular rate and rhythm, S1, S2 normal, no murmur, click, rub or gallop GI: soft, non-tender; bowel sounds normal; no masses,  no organomegaly Extremities: Per Dr. Aline Brochure  Lab Results:  Results for orders placed or performed during the hospital encounter of 12/05/14 (from the past 48 hour(s))  Basic metabolic panel     Status: Abnormal   Collection Time: 12/05/14  5:50 PM  Result Value Ref Range   Sodium 142 135 - 145 mmol/L   Potassium 4.4 3.5 - 5.1 mmol/L   Chloride 108 101 - 111 mmol/L   CO2 23 22 - 32 mmol/L   Glucose, Bld 118 (H) 65 - 99 mg/dL   BUN 36 (H) 6 - 20 mg/dL   Creatinine, Ser 1.65 (H) 0.61 - 1.24 mg/dL   Calcium 8.8 (L) 8.9 - 10.3 mg/dL   GFR calc non Af Amer 36 (L) >60 mL/min   GFR calc Af Amer 42 (L) >60 mL/min    Comment: (NOTE) The eGFR has been calculated using the CKD EPI equation. This calculation has not been validated in all clinical situations. eGFR's persistently <60 mL/min signify possible Chronic Kidney Disease.    Anion gap 11 5 - 15  CBC with Differential     Status: Abnormal   Collection Time: 12/05/14  5:50 PM  Result Value Ref Range   WBC 13.3 (H) 4.0 - 10.5 K/uL   RBC 4.10 (L) 4.22 - 5.81 MIL/uL   Hemoglobin 12.8 (L) 13.0 - 17.0 g/dL   HCT 37.1  (L) 39.0 - 52.0 %   MCV 90.5 78.0 - 100.0 fL   MCH 31.2 26.0 - 34.0 pg   MCHC 34.5 30.0 - 36.0 g/dL   RDW 13.0 11.5 - 15.5 %   Platelets 179 150 - 400 K/uL   Neutrophils Relative % 86 %   Neutro Abs 11.4 (H) 1.7 - 7.7 K/uL   Lymphocytes Relative 8 %   Lymphs Abs 1.1 0.7 - 4.0 K/uL   Monocytes Relative 6 %   Monocytes Absolute 0.8 0.1 - 1.0 K/uL   Eosinophils Relative 0 %   Eosinophils Absolute 0.0 0.0 - 0.7 K/uL   Basophils Relative 0 %   Basophils Absolute 0.0 0.0 - 0.1 K/uL  Protime-INR     Status: None   Collection Time: 12/05/14  5:50 PM  Result Value Ref Range   Prothrombin Time 14.9 11.6 - 15.2 seconds   INR 1.15 0.00 - 1.49  Troponin I     Status: None   Collection Time: 12/05/14  5:50 PM  Result Value Ref Range   Troponin I <0.03 <0.031 ng/mL    Comment:        NO INDICATION OF MYOCARDIAL INJURY.   Surgical pcr screen     Status: None   Collection Time:  12/05/14  9:41 PM  Result Value Ref Range   MRSA, PCR NEGATIVE NEGATIVE   Staphylococcus aureus NEGATIVE NEGATIVE    Comment:        The Xpert SA Assay (FDA approved for NASAL specimens in patients over 21 years of age), is one component of a comprehensive surveillance program.  Test performance has been validated by [facility] for patients greater than or equal to 84 year old. It is not intended to diagnose infection nor to guide or monitor treatment.   Basic metabolic panel     Status: Abnormal   Collection Time: 12/06/14  5:55 AM  Result Value Ref Range   Sodium 142 135 - 145 mmol/L   Potassium 4.3 3.5 - 5.1 mmol/L   Chloride 111 101 - 111 mmol/L   CO2 27 22 - 32 mmol/L   Glucose, Bld 111 (H) 65 - 99 mg/dL   BUN 31 (H) 6 - 20 mg/dL   Creatinine, Ser 1.46 (H) 0.61 - 1.24 mg/dL   Calcium 8.4 (L) 8.9 - 10.3 mg/dL   GFR calc non Af Amer 42 (L) >60 mL/min   GFR calc Af Amer 49 (L) >60 mL/min    Comment: (NOTE) The eGFR has been calculated using the CKD EPI equation. This calculation has not been  validated in all clinical situations. eGFR's persistently <60 mL/min signify possible Chronic Kidney Disease.    Anion gap 4 (L) 5 - 15  CBC     Status: Abnormal   Collection Time: 12/06/14  5:55 AM  Result Value Ref Range   WBC 9.7 4.0 - 10.5 K/uL   RBC 3.89 (L) 4.22 - 5.81 MIL/uL   Hemoglobin 11.9 (L) 13.0 - 17.0 g/dL   HCT 35.6 (L) 39.0 - 52.0 %   MCV 91.5 78.0 - 100.0 fL   MCH 30.6 26.0 - 34.0 pg   MCHC 33.4 30.0 - 36.0 g/dL   RDW 13.0 11.5 - 15.5 %   Platelets 168 150 - 400 K/uL  ABO/Rh     Status: None   Collection Time: 12/06/14  8:35 AM  Result Value Ref Range   ABO/RH(D) A NEG   Prepare RBC     Status: None   Collection Time: 12/06/14  8:43 AM  Result Value Ref Range   Order Confirmation ORDER PROCESSED BY BLOOD BANK   Type and screen [facility]     Status: None (Preliminary result)   Collection Time: 12/06/14  8:43 AM  Result Value Ref Range   ABO/RH(D) A NEG    Antibody Screen NEG    Sample Expiration 12/09/2014    Unit Number [id number]    Blood Component Type RBC LR PHER1    Unit division 00    Status of Unit ALLOCATED    Transfusion Status OK TO TRANSFUSE    Crossmatch Result Compatible    Unit Number [id number]    Blood Component Type RED CELLS,LR    Unit division 00    Status of Unit ALLOCATED    Transfusion Status OK TO TRANSFUSE    Crossmatch Result Compatible   Hemoglobin     Status: Abnormal   Collection Time: 12/06/14  7:33 PM  Result Value Ref Range   Hemoglobin 11.3 (L) 13.0 - 17.0 g/dL  CBC     Status: Abnormal   Collection Time: 12/07/14  6:30 AM  Result Value Ref Range   WBC 9.1 4.0 - 10.5 K/uL   RBC 3.44 (L) 4.22 - 5.81 MIL/uL  Hemoglobin 10.5 (L) 13.0 - 17.0 g/dL   HCT 31.7 (L) 39.0 - 52.0 %   MCV 92.2 78.0 - 100.0 fL   MCH 30.5 26.0 - 34.0 pg   MCHC 33.1 30.0 - 36.0 g/dL   RDW 13.0 11.5 - 15.5 %   Platelets 137 (L) 150 - 400 K/uL  Basic metabolic panel     Status: Abnormal   Collection Time: 12/07/14  6:30 AM   Result Value Ref Range   Sodium 138 135 - 145 mmol/L   Potassium 4.6 3.5 - 5.1 mmol/L   Chloride 107 101 - 111 mmol/L   CO2 26 22 - 32 mmol/L   Glucose, Bld 100 (H) 65 - 99 mg/dL   BUN 27 (H) 6 - 20 mg/dL   Creatinine, Ser 1.49 (H) 0.61 - 1.24 mg/dL   Calcium 8.0 (L) 8.9 - 10.3 mg/dL   GFR calc non Af Amer 41 (L) >60 mL/min   GFR calc Af Amer 48 (L) >60 mL/min    Comment: (NOTE) The eGFR has been calculated using the CKD EPI equation. This calculation has not been validated in all clinical situations. eGFR's persistently <60 mL/min signify possible Chronic Kidney Disease.    Anion gap 5 5 - 15    ABGS No results for input(s): PHART, PO2ART, TCO2, HCO3 in the last 72 hours.  Invalid input(s): PCO2 CULTURES Recent Results (from the past 240 hour(s))  Surgical pcr screen     Status: None   Collection Time: 12/05/14  9:41 PM  Result Value Ref Range Status   MRSA, PCR NEGATIVE NEGATIVE Final   Staphylococcus aureus NEGATIVE NEGATIVE Final    Comment:        The Xpert SA Assay (FDA approved for NASAL specimens in patients over 94 years of age), is one component of a comprehensive surveillance program.  Test performance has been validated by [facility] for patients greater than or equal to 74 year old. It is not intended to diagnose infection nor to guide or monitor treatment.    Studies/Results: Dg Chest 1 View  12/05/2014  CLINICAL DATA:  Fall doing yard work.  Hip pain. EXAM: CHEST  1 VIEW COMPARISON:  Two-view chest x-ray 07/17/2014. FINDINGS: The heart size is normal. The lungs are clear. The visualized soft tissues and bony thorax are unremarkable. IMPRESSION: Negative one-view chest x-ray Electronically Signed   By: San Morelle M.D.   On: 12/05/2014 18:16   Dg Pelvis Portable  12/06/2014  CLINICAL DATA:  Right total hip replacement, postop. EXAM: PORTABLE PELVIS 1-2 VIEWS COMPARISON:  One day prior FINDINGS: Interval right hip arthroplasty. No hardware  complication or periprosthetic fracture identified. Left hip joint space maintained. IMPRESSION: Expected appearance after right hip arthroplasty. Electronically Signed   By: Abigail Miyamoto M.D.   On: 12/06/2014 14:47   Dg Hip Unilat With Pelvis 2-3 Views Right  12/05/2014  CLINICAL DATA:  Status post fall while doing yard work, with right hip pain. Initial encounter. EXAM: DG HIP (WITH OR WITHOUT PELVIS) 2-3V RIGHT COMPARISON:  None. FINDINGS: There is a mildly displaced subcapital fracture through the right femoral neck. The right femoral head remains seated at the acetabulum. There is mild superior displacement of the distal femur. The left hip joint is grossly unremarkable. No significant degenerative change is appreciated. The sacroiliac joints are unremarkable in appearance. The visualized bowel gas pattern is grossly unremarkable in appearance. IMPRESSION: Mildly displaced subcapital fracture through the right femoral neck. Electronically Signed  By: Garald Balding M.D.   On: 12/05/2014 18:18    Medications:  Prior to Admission:  Prescriptions prior to admission  Medication Sig Dispense Refill Last Dose  . aspirin 81 MG EC tablet Take 81 mg by mouth daily.     12/05/2014 at Unknown time  . Azilsartan Medoxomil (EDARBI) 40 MG TABS Take 1 tablet by mouth daily.   12/04/2014 at Unknown time  . cloNIDine (CATAPRES - DOSED IN MG/24 HR) 0.3 mg/24hr patch Place 0.3 mg onto the skin once a week.   1 12/01/2014 at Unknown time  . cloNIDine (CATAPRES) 0.1 MG tablet Take 0.1 mg by mouth at bedtime.    12/04/2014 at Unknown time  . dutasteride (AVODART) 0.5 MG capsule Take 0.5 mg by mouth daily.    12/04/2014 at Unknown time  . hydrALAZINE (APRESOLINE) 50 MG tablet Take 50 mg by mouth 2 (two) times daily.    12/05/2014 at Unknown time  . Nebivolol HCl (BYSTOLIC) 20 MG TABS Take 20 mg by mouth daily.   12/04/2014 at Unknown time  . rosuvastatin (CRESTOR) 20 MG tablet Take 20 mg by mouth daily.      12/04/2014 at Unknown time  . azithromycin (ZITHROMAX) 250 MG tablet Starting on 11/21/2014, take two tablets on day 1 then take 1 tablet on days 2 through 5  0 Completed Course at Unknown time  . cephALEXin (KEFLEX) 500 MG capsule Take 500 mg by mouth 3 (three) times daily. 10 day course starting on 11/18/2014  0 Completed Course at Unknown time   Scheduled: . acetaminophen  1,000 mg Intravenous 4 times per day  . aspirin EC  325 mg Oral Q breakfast  . [START ON 12/08/2014] cloNIDine  0.3 mg Transdermal Weekly  . cloNIDine  0.1 mg Oral QHS  . docusate sodium  100 mg Oral BID  . dutasteride  0.5 mg Oral Daily  . hydrALAZINE  50 mg Oral BID  . irbesartan  300 mg Oral QHS  . nebivolol  20 mg Oral Daily  . phenazopyridine  100 mg Oral TID WC  . polyethylene glycol  17 g Oral Daily  . rosuvastatin  20 mg Oral q1800  . traMADol  50 mg Oral 4 times per day   Continuous: . sodium chloride 100 mL/hr at 12/07/14 0328   ZJI:[id number], HYDROcodone-acetaminophen, menthol-cetylpyridinium **OR** phenol, methocarbamol **OR** methocarbamol (ROBAXIN)  IV, metoCLOPramide **OR** metoCLOPramide (REGLAN) injection, morphine injection, ondansetron **OR** ondansetron (ZOFRAN) IV, senna-docusate, sodium phosphate  Assesment: He had right hip fracture and had bipolar hip replacement yesterday. He has done well so far. At baseline he has hypertension which is pretty well-controlled chronic kidney disease and his renal function is actually a little bit better Principal Problem:   Closed right hip fracture (HCC) Active Problems:   Closed hip fracture (HCC)   Hypertension   BPH (benign prostatic hyperplasia)   Hyperlipidemia    Plan: Continue current treatments. Start physical therapy now.    LOS: 2 days   , L 12/07/2014, 9:11 AM

## 2014-12-07 NOTE — Progress Notes (Signed)
OT Cancellation Note  Patient Details Name: Dominic Hodges MRN: [id number] DOB: June 15, 1927   Cancelled Treatment:     Reason evaluation not completed: Chart reviewed, PT consulted. Pt reporting generalized pain and is demonstrating fatigue from participating in 2 PT sessions this today. Will defer OT evaluation to tomorrow.   Guadelupe Sabin, OTR/L  [phone]  12/07/2014, 12:46 PM

## 2014-12-07 NOTE — NC FL2 (Signed)
[place] MEDICAID FL2 LEVEL OF CARE SCREENING TOOL     IDENTIFICATION  Patient Name: Dominic Hodges: July 27, 1927 Sex: male Admission Date (Current Location): 12/05/2014  [facility] and [place] Number:     Facility and Address:  [facility] [address], [place]      Provider Number: [id number]  Attending Physician Name and Address:  Sinda Du, MD  Relative Name and Phone Number:       Current Level of Care: Hospital Recommended Level of Care: [place] Prior Approval Number:    Date Approved/Denied:   PASRR Number: [id number]  Discharge Plan: SNF    Current Diagnoses: Patient Active Problem List   Diagnosis Date Noted  . Closed right hip fracture ([place]) 12/05/2014  . Closed hip fracture ([place]) 12/05/2014  . Hypertension 12/05/2014  . BPH (benign prostatic hyperplasia) 12/05/2014  . Hyperlipidemia 12/05/2014  . Essential hypertension, benign 06/01/2010  . NUMBNESS/TINGLING 11/20/2006  . BACK PAIN 11/20/2006    Orientation ACTIVITIES/SOCIAL BLADDER RESPIRATION    Self, Time, Situation, Place  Active, Family supportive Continent O2 (As needed) (2L)  BEHAVIORAL SYMPTOMS/MOOD NEUROLOGICAL BOWEL NUTRITION STATUS  Other (Comment) (n/a)  (n/a) Continent Diet (Regular)  PHYSICIAN VISITS COMMUNICATION OF NEEDS Height & Weight Skin    Verbally 6\' 1"  (185.4 cm) 166 lbs. Surgical wounds          AMBULATORY STATUS RESPIRATION    Assist extensive O2 (As needed) (2L)      Personal Care Assistance Level of Assistance  Bathing, Feeding, Dressing Bathing Assistance: Maximum assistance Feeding assistance: Limited assistance Dressing Assistance: Maximum assistance      Functional Limitations Info  Sight, Hearing, Speech Sight Info: Adequate Hearing Info: Adequate Speech Info: Adequate       SPECIAL CARE FACTORS FREQUENCY  PT (By licensed PT), OT (By licensed OT)     PT Frequency: 5 OT Frequency: 5            Additional Factors Info  Code Status, Allergies Code Status Info: Full code Allergies Info: No known allergies           Current Medications (12/07/2014):  This is the current hospital active medication list Current Facility-Administered Medications  Medication Dose Route Frequency Provider Last Rate Last Dose  . 0.9 %  sodium chloride infusion   Intravenous Continuous Carole Civil, MD 10 mL/hr at 12/07/14 0831    . aspirin EC tablet 325 mg  325 mg Oral Q breakfast Carole Civil, MD   325 mg at 12/07/14 0942  . bisacodyl (DULCOLAX) EC tablet 5 mg  5 mg Oral Daily PRN Carole Civil, MD      . Derrill Memo ON 12/08/2014] cloNIDine (CATAPRES - Dosed in mg/24 hr) patch 0.3 mg  0.3 mg Transdermal Weekly Theressa Millard, MD      . cloNIDine (CATAPRES) tablet 0.1 mg  0.1 mg Oral QHS Theressa Millard, MD   0.1 mg at 12/06/14 2112  . docusate sodium (COLACE) capsule 100 mg  100 mg Oral BID Carole Civil, MD   100 mg at 12/07/14 0941  . dutasteride (AVODART) capsule 0.5 mg  0.5 mg Oral Daily Theressa Millard, MD   0.5 mg at 12/06/14 2112  . hydrALAZINE (APRESOLINE) tablet 50 mg  50 mg Oral BID Theressa Millard, MD   50 mg at 12/07/14 0941  . HYDROcodone-acetaminophen (NORCO/VICODIN) 5-325 MG per tablet 1 tablet  1 tablet Oral Q4H PRN Dorothyann Peng  Vela Prose, MD      . irbesartan (AVAPRO) tablet 300 mg  300 mg Oral QHS Carole Civil, MD   300 mg at 12/06/14 2112  . menthol-cetylpyridinium (CEPACOL) lozenge 3 mg  1 lozenge Oral PRN Carole Civil, MD       Or  . phenol (CHLORASEPTIC) mouth spray 1 spray  1 spray Mouth/Throat PRN Carole Civil, MD      . methocarbamol (ROBAXIN) tablet 500 mg  500 mg Oral Q6H PRN Carole Civil, MD       Or  . methocarbamol (ROBAXIN) 500 mg in dextrose 5 % 50 mL IVPB  500 mg Intravenous Q6H PRN Carole Civil, MD      . metoCLOPramide (REGLAN) tablet 5-10 mg  5-10 mg Oral Q8H PRN Carole Civil, MD       Or  .  metoCLOPramide (REGLAN) injection 5-10 mg  5-10 mg Intravenous Q8H PRN Carole Civil, MD      . morphine 2 MG/ML injection 2 mg  2 mg Intravenous Q2H PRN Carole Civil, MD      . nebivolol (BYSTOLIC) tablet 20 mg  20 mg Oral Daily Theressa Millard, MD   20 mg at 12/06/14 2112  . ondansetron (ZOFRAN) tablet 4 mg  4 mg Oral Q6H PRN Carole Civil, MD       Or  . ondansetron [facility]) injection 4 mg  4 mg Intravenous Q6H PRN Carole Civil, MD   4 mg at 12/07/14 0946  . phenazopyridine (PYRIDIUM) tablet 100 mg  100 mg Oral TID WC Sinda Du, MD   100 mg at 12/07/14 0941  . polyethylene glycol (MIRALAX / GLYCOLAX) packet 17 g  17 g Oral Daily Carole Civil, MD   17 g at 12/07/14 [phone]  . rosuvastatin (CRESTOR) tablet 20 mg  20 mg Oral q1800 Theressa Millard, MD   20 mg at 12/06/14 1705  . senna-docusate (Senokot-S) tablet 1 tablet  1 tablet Oral QHS PRN Carole Civil, MD      . sodium phosphate (FLEET) 7-19 GM/118ML enema 1 enema  1 enema Rectal Once PRN Carole Civil, MD      . traMADol Veatrice Bourbon) tablet 50 mg  50 mg Oral 4 times per day Carole Civil, MD   50 mg at 12/07/14 1145     Discharge Medications: Please see discharge summary for a list of discharge medications.  Relevant Imaging Results:  Relevant Lab Results:  Recent Labs    Additional Information    Salome Arnt, [place]

## 2014-12-08 LAB — BASIC METABOLIC PANEL
BUN: 32 mg/dL — AB (ref 6–20)
CO2: 27 mmol/L (ref 22–32)
Calcium: 7.8 mg/dL — ABNORMAL LOW (ref 8.9–10.3)
Chloride: 108 mmol/L (ref 101–111)
Creatinine, Ser: 1.79 mg/dL — ABNORMAL HIGH (ref 0.61–1.24)
GFR, EST AFRICAN AMERICAN: 38 mL/min — AB (ref >60)
GFR, EST NON AFRICAN AMERICAN: 33 mL/min — AB (ref >60)
Glucose, Bld: 123 mg/dL — ABNORMAL HIGH (ref 65–99)
POTASSIUM: 4.3 mmol/L (ref 3.5–5.1)
SODIUM: 137 mmol/L (ref 135–145)

## 2014-12-08 LAB — CBC
HEMATOCRIT: 28.8 % — AB (ref 39.0–52.0)
HEMOGLOBIN: 9.9 g/dL — AB (ref 13.0–17.0)
MCH: 31.2 pg (ref 26.0–34.0)
MCHC: 34.4 g/dL (ref 30.0–36.0)
MCV: 90.9 fL (ref 78.0–100.0)
PLATELETS: 128 10*3/uL — AB (ref 150–400)
RBC: 3.17 MIL/uL — AB (ref 4.22–5.81)
RDW: 12.8 % (ref 11.5–15.5)
WBC: 8.5 10*3/uL (ref 4.0–10.5)

## 2014-12-08 LAB — URINE CULTURE: CULTURE: NO GROWTH

## 2014-12-08 MED ORDER — HYDRALAZINE HCL 25 MG PO TABS
25.0000 mg | ORAL_TABLET | Freq: Two times a day (BID) | ORAL | Status: DC
  Filled 2014-12-08: qty 1

## 2014-12-08 NOTE — Anesthesia Postprocedure Evaluation (Signed)
Anesthesia Post Note  Patient: Dominic Hodges  Procedure(s) Performed: Procedure(s) (LRB): PARTIAL RIGHT HIP REPLACEMENT (Right)  Patient location during evaluation: Nursing Unit Anesthesia Type: Spinal Level of consciousness: awake and alert and oriented Pain management: pain level controlled Vital Signs Assessment: post-procedure vital signs reviewed and stable Respiratory status: spontaneous breathing and respiratory function stable Cardiovascular status: stable Postop Assessment: No headache, No backache and No signs of nausea or vomiting Anesthetic complications: no    Last Vitals:  Filed Vitals:   12/08/14 0852 12/08/14 1107  BP: 103/48   Pulse: 60 60  Temp: 37.3 C   Resp: 20     Last Pain:  Filed Vitals:   12/08/14 1108  PainSc: 7     LLE Motor Response: Purposeful movement LLE Sensation: Full sensation RLE Motor Response: Purposeful movement RLE Sensation: Full sensation L Sensory Level: Other (Comment) (diminishing) R Sensory Level: Other (Comment) (diminishing)  ADAMS, AMY A

## 2014-12-08 NOTE — Clinical Social Work Placement (Signed)
   CLINICAL SOCIAL WORK PLACEMENT  NOTE  Date:  12/08/2014  Patient Details  Name: Dominic Hodges MRN: [id number] Date of Birth: 04-27-1927  Clinical Social Work is seeking post-discharge placement for this patient at the [place] level of care (*CSW will initial, date and re-position this form in  chart as items are completed):  Yes   Patient/family provided with [place] Work Department's list of facilities offering this level of care within the geographic area requested by the patient (or if unable, by the patient's family).  Yes   Patient/family informed of their freedom to choose among providers that offer the needed level of care, that participate in Medicare, Medicaid or managed care program needed by the patient, have an available bed and are willing to accept the patient.  Yes   Patient/family informed of [facility]'s ownership interest in [facility] and [facility], as well as of the fact that they are under no obligation to receive care at these facilities.  PASRR submitted to EDS on 12/07/14     PASRR number received on 12/07/14     Existing PASRR number confirmed on       FL2 transmitted to all facilities in geographic area requested by pt/family on 12/07/14     FL2 transmitted to all facilities within larger geographic area on       Patient informed that his/her managed care company has contracts with or will negotiate with certain facilities, including the following:        Yes   Patient/family informed of bed offers received.  Patient chooses bed at [facility]     Physician recommends and patient chooses bed at      Patient to be transferred to [facility] on  .  Patient to be transferred to facility by       Patient family notified on   of transfer.  Name of family member notified:        PHYSICIAN       Additional Comment:    _______________________________________________ Salome Arnt, [place] 12/08/2014, 9:10 AM [phone]

## 2014-12-08 NOTE — Addendum Note (Signed)
Addendum  created 12/08/14 1130 by Mickel Baas, CRNA   Modules edited: Clinical Notes   Clinical Notes:  File: [id number]

## 2014-12-08 NOTE — Progress Notes (Signed)
Physical Therapy Treatment Patient Details Name: Dominic Hodges MRN: [id number] DOB: [birth year] Today's Date: 12/08/2014    History of Present Illness HPI: Dominic Hodges is a 81 y.o. male with a history of HTN, Hyperlipidemia, and BPH who was in his yard doing yard work and he was pulling up a rosebush and fell back onto his right hip and had increased pain and could not bear weight on his right leg. He was taken to the ED and found on X-rays to have a closed Right Hip Fracture. Arrangements were made for admission and Orthopedic Dr Aline Brochure was consulted to see him in the AM for Repair. He complaints of 8/10 pain of his right hip if he moves.     PT Comments    Pt tolerating treatment session well, motivated and able to complete most of PT sesssion as planned, but still limited in trunk control during sitting and transfers. Pt continues to make progress toward goals as evidenced by improve knee extensor and ankle DF activation, but SAQ are most limited by combined R quads weakness and hamstrings tightness. Pt's greatest limitation continues to be low power output during functional mobility tasks, requriing max-total assist for transfers adn bed mobility. Weakness in BUE, and new onset pain in L should with resisted shoulder flexion and resisted GHJ internal rotation preclude RW use as a viable option, limiting ability to perform al mobility tasks, ADL, IADL, and work at baseline function. Patient presenting with impairment of strength, pain, range of motion, balance, O2 perfusion and activity tolerance, limiting ability to perform ADL and mobility tasks at  baseline level of function. Patient will benefit from skilled intervention to address the above impairments and limitations, in order to restore to prior level of function, improve patient safety upon discharge, and to decrease caregiver burden.    Follow Up Recommendations  SNF     Equipment Recommendations  None recommended  by PT    Recommendations for Other Services OT consult     Precautions / Restrictions Precautions Precautions: None (Postop hip precautions) Precaution Comments: direct lateral approach- Hemi hip Restrictions Weight Bearing Restrictions: Yes RLE Weight Bearing: Weight bearing as tolerated    Mobility  Bed Mobility Overal bed mobility: +2 for physical assistance;Needs Assistance Bed Mobility: Sit to Supine     Supine to sit: Total assist;HOB elevated Sit to supine: Max assist;+2 for physical assistance   General bed mobility comments: Good sitting stability.   Transfers Overall transfer level: Needs assistance Equipment used: 1 person hand held assist;None;2 person hand held assist Transfers: Sit to/from Stand Sit to Stand: Max assist;+2 physical assistance (improved strength, ability to bring trunk forward is limited by soreness in R hio joint. ) Stand pivot transfers: Total assist;+2 physical assistance       General transfer comment: Transfers are not therapeutic at this time due to profound weakness and poor trunk control, posterior lean, bilat knee buckling. Lift apporpriate for nursing.   Ambulation/Gait Ambulation/Gait assistance: Max assist;+2 physical assistance Ambulation Distance (Feet): 3 Feet (bed to chair )         General Gait Details: Unable, inappropriate.    Stairs            Wheelchair Mobility    Modified Rankin (Stroke Patients Only)       Balance Overall balance assessment: Needs assistance   Sitting balance-Leahy Scale: Fair Sitting balance - Comments: mild posterior lean, unabel to come past neutral.      Standing balance-Leahy  Scale: Zero                      Cognition Arousal/Alertness: Awake/alert;Suspect due to medications Behavior During Therapy: [facility] for tasks assessed/performed Overall Cognitive Status: Within Functional Limits for tasks assessed       Memory: Decreased recall of precautions (reviewed  precautions again, and discussed pillow)              Exercises Total Joint Exercises Ankle Circles/Pumps: AAROM;Right;15 reps;Supine (manually resisted ankle plantar flexion. ) Quad Sets: Supine;15 reps;Right;AAROM (poor quads and dorsiflexors activation, mostly fasciculation. ) Gluteal Sets: AROM;Both;15 reps;Supine (hips at 60, knees 90, bridging inittiation. ) Towel Squeeze: Both;10 reps;Supine (10x3sec 2 pillow squeeze. ) Short Arc Quad: AAROM;Right;15 reps;Supine (poor activation, requires ModA) General Exercises - Upper Extremity Shoulder Flexion: Right;10 reps (R chest press wmanually resisted. ) Elbow Extension: Left;Strengthening;10 reps (manually resisted ) Other Exercises Other Exercises: manually resisted R ankle dorsiflexion 1x15; manually reisted R ankle plantar flexion 1x15;  Other Exercises: manually resisted R hamstring curls 2x10; modA seated R LAQ 2x10;  Other Exercises: Seated marching 1x10 bilat (maxA on R- 70-100 degrees hip flexion) Other Exercises: L LAQ 1x15 Other Exercises: Sitting lateral trunk weight shifting 1x10 bilat, zero UE support.     General Comments        Pertinent Vitals/Pain Pain Assessment: 0-10 Pain Score: 2  Pain Location: Worse with AROM;  Pain Intervention(s): Limited activity within patient's tolerance;Monitored during session;Premedicated before session    Home Living                      Prior Function            PT Goals (current goals can now be found in the care plan section) Acute Rehab PT Goals Patient Stated Goal: [place] to go home; Agreeable to DC to [facility] PT Goal Formulation: With patient Time For Goal Achievement: 12/21/14 Potential to Achieve Goals: Good Progress towards PT goals: Progressing toward goals    Frequency  BID    PT Plan Current plan remains appropriate    Co-evaluation             End of Session Equipment Utilized During Treatment: Gait belt;Oxygen Activity Tolerance: Patient  tolerated treatment well;Patient limited by pain;Other (comment) (limited by profound weakness. ) Patient left: in bed;with bed alarm set;with family/visitor present;with call bell/phone within reach;with SCD's reapplied     Time: [id number] PT Time Calculation (min) (ACUTE ONLY): 26 min  Charges:  $Therapeutic Exercise: 8-22 mins $Therapeutic Activity: 8-22 mins                    G Codes:      Buccola,Allan C 01-05-15, 2:06 PM  2:10 PM  Etta Grandchild, PT, DPT [place] License # [id number]

## 2014-12-08 NOTE — Progress Notes (Signed)
Subjective: 2 Days Post-Op Procedure(s) (LRB): PARTIAL RIGHT HIP REPLACEMENT (Right) Patient reports pain as mild.    Objective: Vital signs in last 24 hours: Temp:  [98.1 F (36.7 C)-99.1 F (37.3 C)] 99.1 F (37.3 C) (11/30 0852) Pulse Rate:  [58-65] 60 (11/30 1107) Resp:  [20] 20 (11/30 0852) BP: (102-107)/(48-58) 103/48 mmHg (11/30 0852) SpO2:  [92 %-96 %] 96 % (11/30 1107)  Intake/Output from previous day: 11/29 0701 - 11/30 0700 In: 545 [P.O.:480; I.V.:65] Out: 500 [Urine:500] Intake/Output this shift:     Recent Labs  12/05/14 1750 12/06/14 0555 12/06/14 1933 12/07/14 0630 12/08/14 0554  HGB 12.8* 11.9* 11.3* 10.5* 9.9*    Recent Labs  12/07/14 0630 12/08/14 0554  WBC 9.1 8.5  RBC 3.44* 3.17*  HCT 31.7* 28.8*  PLT 137* 128*    Recent Labs  12/07/14 0630 12/08/14 0554  NA 138 137  K 4.6 4.3  CL 107 108  CO2 26 27  BUN 27* 32*  CREATININE 1.49* 1.79*  GLUCOSE 100* 123*  CALCIUM 8.0* 7.8*    Recent Labs  12/05/14 1750  INR 1.15    Neurologically intact   Leg lengths look equal calf soft   Assessment/Plan: 2 Days Post-Op Procedure(s) (LRB): PARTIAL RIGHT HIP REPLACEMENT (Right) Advance diet Up with therapy  Arther Abbott 12/08/2014, 11:12 AM

## 2014-12-08 NOTE — Progress Notes (Signed)
Subjective: He says he is doing okay. He has no new complaints. His blood pressure has been somewhat low. He has decided to go to a skilled care facility which I think is appropriate  Objective: Vital signs in last 24 hours: Temp:  [98.1 F (36.7 C)-99.1 F (37.3 C)] 99.1 F (37.3 C) (11/30 0852) Pulse Rate:  [58-65] 60 (11/30 0852) Resp:  [20] 20 (11/30 0852) BP: (102-107)/(48-58) 103/48 mmHg (11/30 0852) SpO2:  [92 %-96 %] 95 % (11/30 0852) Weight change:  Last BM Date: 12/07/14  Intake/Output from previous day: 11/29 0701 - 11/30 0700 In: 545 [P.O.:480; I.V.:65] Out: 500 [Urine:500]  PHYSICAL EXAM General appearance: alert, cooperative and mild distress Resp: clear to auscultation bilaterally Cardio: regular rate and rhythm, S1, S2 normal, no murmur, click, rub or gallop GI: soft, non-tender; bowel sounds normal; no masses,  no organomegaly Extremities: Fractured right leg with his surgery  Lab Results:  Results for orders placed or performed during the hospital encounter of 12/05/14 (from the past 48 hour(s))  Hemoglobin     Status: Abnormal   Collection Time: 12/06/14  7:33 PM  Result Value Ref Range   Hemoglobin 11.3 (L) 13.0 - 17.0 g/dL  CBC     Status: Abnormal   Collection Time: 12/07/14  6:30 AM  Result Value Ref Range   WBC 9.1 4.0 - 10.5 K/uL   RBC 3.44 (L) 4.22 - 5.81 MIL/uL   Hemoglobin 10.5 (L) 13.0 - 17.0 g/dL   HCT 31.7 (L) 39.0 - 52.0 %   MCV 92.2 78.0 - 100.0 fL   MCH 30.5 26.0 - 34.0 pg   MCHC 33.1 30.0 - 36.0 g/dL   RDW 13.0 11.5 - 15.5 %   Platelets 137 (L) 150 - 400 K/uL  Basic metabolic panel     Status: Abnormal   Collection Time: 12/07/14  6:30 AM  Result Value Ref Range   Sodium 138 135 - 145 mmol/L   Potassium 4.6 3.5 - 5.1 mmol/L   Chloride 107 101 - 111 mmol/L   CO2 26 22 - 32 mmol/L   Glucose, Bld 100 (H) 65 - 99 mg/dL   BUN 27 (H) 6 - 20 mg/dL   Creatinine, Ser 1.49 (H) 0.61 - 1.24 mg/dL   Calcium 8.0 (L) 8.9 - 10.3 mg/dL   GFR  calc non Af Amer 41 (L) >60 mL/min   GFR calc Af Amer 48 (L) >60 mL/min    Comment: (NOTE) The eGFR has been calculated using the CKD EPI equation. This calculation has not been validated in all clinical situations. eGFR's persistently <60 mL/min signify possible Chronic Kidney Disease.    Anion gap 5 5 - 15  Culture, Urine     Status: None   Collection Time: 12/07/14 10:32 AM  Result Value Ref Range   Specimen Description URINE, RANDOM    Special Requests none    Culture      NO GROWTH 1 DAY Performed at [facility]    Report Status 12/08/2014 FINAL   Urinalysis, Routine w reflex microscopic (not at [facility])     Status: Abnormal   Collection Time: 12/07/14 10:32 AM  Result Value Ref Range   Color, Urine YELLOW YELLOW   APPearance CLEAR CLEAR   Specific Gravity, Urine 1.025 1.005 - 1.030   pH 5.5 5.0 - 8.0   Glucose, UA NEGATIVE NEGATIVE mg/dL   Hgb urine dipstick TRACE (A) NEGATIVE   Bilirubin Urine NEGATIVE NEGATIVE   Ketones, ur  NEGATIVE NEGATIVE mg/dL   Protein, ur 30 (A) NEGATIVE mg/dL   Nitrite NEGATIVE NEGATIVE   Leukocytes, UA NEGATIVE NEGATIVE  Urine microscopic-add on     Status: Abnormal   Collection Time: 12/07/14 10:32 AM  Result Value Ref Range   Squamous Epithelial / LPF NONE SEEN NONE SEEN    Comment: Please note change in reference range.   WBC, UA NONE SEEN 0 - 5 WBC/hpf    Comment: Please note change in reference range.   RBC / HPF 0-5 0 - 5 RBC/hpf    Comment: Please note change in reference range.   Bacteria, UA MANY (A) NONE SEEN    Comment: Please note change in reference range.  CBC     Status: Abnormal   Collection Time: 12/08/14  5:54 AM  Result Value Ref Range   WBC 8.5 4.0 - 10.5 K/uL   RBC 3.17 (L) 4.22 - 5.81 MIL/uL   Hemoglobin 9.9 (L) 13.0 - 17.0 g/dL   HCT 28.8 (L) 39.0 - 52.0 %   MCV 90.9 78.0 - 100.0 fL   MCH 31.2 26.0 - 34.0 pg   MCHC 34.4 30.0 - 36.0 g/dL   RDW 12.8 11.5 - 15.5 %   Platelets 128 (L) 150 - 400 K/uL   Basic metabolic panel     Status: Abnormal   Collection Time: 12/08/14  5:54 AM  Result Value Ref Range   Sodium 137 135 - 145 mmol/L   Potassium 4.3 3.5 - 5.1 mmol/L   Chloride 108 101 - 111 mmol/L   CO2 27 22 - 32 mmol/L   Glucose, Bld 123 (H) 65 - 99 mg/dL   BUN 32 (H) 6 - 20 mg/dL   Creatinine, Ser 1.79 (H) 0.61 - 1.24 mg/dL   Calcium 7.8 (L) 8.9 - 10.3 mg/dL   GFR calc non Af Amer 33 (L) >60 mL/min   GFR calc Af Amer 38 (L) >60 mL/min    Comment: (NOTE) The eGFR has been calculated using the CKD EPI equation. This calculation has not been validated in all clinical situations. eGFR's persistently <60 mL/min signify possible Chronic Kidney Disease.     ABGS No results for input(s): PHART, PO2ART, TCO2, HCO3 in the last 72 hours.  Invalid input(s): PCO2 CULTURES Recent Results (from the past 240 hour(s))  Surgical pcr screen     Status: None   Collection Time: 12/05/14  9:41 PM  Result Value Ref Range Status   MRSA, PCR NEGATIVE NEGATIVE Final   Staphylococcus aureus NEGATIVE NEGATIVE Final    Comment:        The Xpert SA Assay (FDA approved for NASAL specimens in patients over 69 years of age), is one component of a comprehensive surveillance program.  Test performance has been validated by [facility] for patients greater than or equal to 28 year old. It is not intended to diagnose infection nor to guide or monitor treatment.   Culture, Urine     Status: None   Collection Time: 12/07/14 10:32 AM  Result Value Ref Range Status   Specimen Description URINE, RANDOM  Final   Special Requests none  Final   Culture   Final    NO GROWTH 1 DAY Performed at [facility]    Report Status 12/08/2014 FINAL  Final   Studies/Results: Dg Pelvis Portable  12/06/2014  CLINICAL DATA:  Right total hip replacement, postop. EXAM: PORTABLE PELVIS 1-2 VIEWS COMPARISON:  One day prior FINDINGS: Interval right hip arthroplasty.  No hardware complication or periprosthetic  fracture identified. Left hip joint space maintained. IMPRESSION: Expected appearance after right hip arthroplasty. Electronically Signed   By: Abigail Miyamoto M.D.   On: 12/06/2014 14:47    Medications:  Prior to Admission:  Prescriptions prior to admission  Medication Sig Dispense Refill Last Dose  . aspirin 81 MG EC tablet Take 81 mg by mouth daily.     12/05/2014 at Unknown time  . Azilsartan Medoxomil (EDARBI) 40 MG TABS Take 1 tablet by mouth daily.   12/04/2014 at Unknown time  . cloNIDine (CATAPRES - DOSED IN MG/24 HR) 0.3 mg/24hr patch Place 0.3 mg onto the skin once a week.   1 12/01/2014 at Unknown time  . cloNIDine (CATAPRES) 0.1 MG tablet Take 0.1 mg by mouth at bedtime.    12/04/2014 at Unknown time  . dutasteride (AVODART) 0.5 MG capsule Take 0.5 mg by mouth daily.    12/04/2014 at Unknown time  . hydrALAZINE (APRESOLINE) 50 MG tablet Take 50 mg by mouth 2 (two) times daily.    12/05/2014 at Unknown time  . Nebivolol HCl (BYSTOLIC) 20 MG TABS Take 20 mg by mouth daily.   12/04/2014 at Unknown time  . rosuvastatin (CRESTOR) 20 MG tablet Take 20 mg by mouth daily.     12/04/2014 at Unknown time  . azithromycin (ZITHROMAX) 250 MG tablet Starting on 11/21/2014, take two tablets on day 1 then take 1 tablet on days 2 through 5  0 Completed Course at Unknown time  . cephALEXin (KEFLEX) 500 MG capsule Take 500 mg by mouth 3 (three) times daily. 10 day course starting on 11/18/2014  0 Completed Course at Unknown time   Scheduled: . aspirin EC  325 mg Oral Q breakfast  . cloNIDine  0.3 mg Transdermal Weekly  . docusate sodium  100 mg Oral BID  . dutasteride  0.5 mg Oral Daily  . hydrALAZINE  25 mg Oral BID  . irbesartan  300 mg Oral QHS  . nebivolol  20 mg Oral Daily  . phenazopyridine  100 mg Oral TID WC  . polyethylene glycol  17 g Oral Daily  . rosuvastatin  20 mg Oral q1800   Continuous: . sodium chloride 10 mL/hr at 12/07/14 0831   SLP:[id number], HYDROcodone-acetaminophen,  menthol-cetylpyridinium **OR** phenol, methocarbamol **OR** methocarbamol (ROBAXIN)  IV, metoCLOPramide **OR** metoCLOPramide (REGLAN) injection, morphine injection, ondansetron **OR** ondansetron (ZOFRAN) IV, senna-docusate, sodium phosphate  Assesment: He was admitted with hip fracture and underwent surgery 2 days ago. He is doing better. He has hypertension which is well controlled and his blood pressure is actually a little bit low. He has hypertensive chronic renal failure which is pretty stable. Principal Problem:   Closed right hip fracture (HCC) Active Problems:   Closed hip fracture (HCC)   Hypertension   BPH (benign prostatic hyperplasia)   Hyperlipidemia    Plan: Continue current treatments including physical therapy etc. He will have transfer to skilled care facility later    LOS: 3 days   Hervey Wedig L 12/08/2014, 10:53 AM

## 2014-12-08 NOTE — Progress Notes (Signed)
Physical Therapy Treatment Patient Details Name: Dominic Hodges MRN: [id number] DOB: 05-22-1927 Today's Date: 12/08/2014    History of Present Illness HPI: Dominic Hodges is a 81 y.o. male with a history of HTN, Hyperlipidemia, and BPH who was in his yard doing yard work and he was pulling up a rosebush and fell back onto his right hip and had increased pain and could not bear weight on his right leg. He was taken to the ED and found on X-rays to have a closed Right Hip Fracture. Arrangements were made for admission and Orthopedic Dr Aline Brochure was consulted to see him in the AM for Repair. He complaints of 8/10 pain of his right hip if he moves.     PT Comments    Pt tolerating treatment session well, motivated and able to complete most PT sesssion as planned. Pt continues to make progress toward goals as evidenced by improved familiarity with hip precautions, improved pain control, and decreased level of malaise. Pt's greatest limitation continues to be profound weakness on RLE which continues to limit ability to perform all functional mobility at baseline function. Patient presenting with impairment of strength, pain, range of motion, balance, O2 saturation, and activity tolerance, limiting ability to perform ADL and mobility tasks at  baseline level of function. Patient will benefit from skilled intervention to address the above impairments and limitations, in order to restore to prior level of function, improve patient safety upon discharge, and to decrease caregiver burden.    Follow Up Recommendations  SNF     Equipment Recommendations  None recommended by PT    Recommendations for Other Services OT consult     Precautions / Restrictions Precautions Precautions: None (Direct Lateral Hip approach) Precaution Comments: direct lateral approach Restrictions Weight Bearing Restrictions: Yes RLE Weight Bearing: Weight bearing as tolerated    Mobility  Bed  Mobility Overal bed mobility: +2 for physical assistance;Needs Assistance Bed Mobility: Sit to Supine     Supine to sit: Total assist;HOB elevated     General bed mobility comments: Once seated EOB, able to remain upright with good trunk stability.   Transfers Overall transfer level: Needs assistance Equipment used: None;1 person hand held assist Transfers: Sit to/from Stand Sit to Stand: Max assist (Dififculty bringing center of mass forward, posterior lean with stance and transfer. )            Ambulation/Gait Ambulation/Gait assistance: Max assist;+2 physical assistance Ambulation Distance (Feet): 3 Feet (bed to chair )         General Gait Details: Max to total assist for weight shifting and bilat knee block; avoidance of RW at thsi time due to BUE weakness adn LUE pain.    Stairs            Wheelchair Mobility    Modified Rankin (Stroke Patients Only)       Balance     Sitting balance-Leahy Scale: Good       Standing balance-Leahy Scale: Zero                      Cognition Arousal/Alertness: Awake/alert;Lethargic;Suspect due to medications Behavior During Therapy: [facility] for tasks assessed/performed;Flat affect Overall Cognitive Status: Within Functional Limits for tasks assessed       Memory: Decreased recall of precautions (reviewed hip precautions, was able to remember 1 upon entry. )              Exercises Total Joint Exercises Ankle Circles/Pumps:  AAROM;Right;15 reps;Supine (manually resisted ankle plantar flexion. ) Quad Sets: Supine;15 reps;Right;AAROM (poor quads and dorsiflexors activation, mostly fasciculation. ) Gluteal Sets: AROM;Both;15 reps;Supine (hips at 60, knees 90, bridging inittiation. ) Towel Squeeze: Both;10 reps;Supine (10x3sec 2 pillow squeeze. ) Short Arc Quad: AAROM;Right;15 reps;Supine (poor activation, requires ModA) General Exercises - Upper Extremity Shoulder Flexion: Right;10 reps (R chest press  wmanually resisted. ) Elbow Extension: Left;Strengthening;10 reps (manually resisted )    General Comments        Pertinent Vitals/Pain Pain Assessment: 0-10 Pain Score: 2  Pain Location: Worse with AROM;  Pain Intervention(s): Limited activity within patient's tolerance;Monitored during session;Premedicated before session;Ice applied    Home Living                      Prior Function            PT Goals (current goals can now be found in the care plan section) Acute Rehab PT Goals Patient Stated Goal: [place] to go home; Agreeable to DC to [facility] PT Goal Formulation: With patient Time For Goal Achievement: 12/21/14 Potential to Achieve Goals: Good Progress towards PT goals: Progressing toward goals    Frequency  BID    PT Plan      Co-evaluation             End of Session Equipment Utilized During Treatment: Gait belt;Oxygen Activity Tolerance: Patient limited by fatigue;Patient tolerated treatment well Patient left: in chair;with call bell/phone within reach     Time: 1022-1104 PT Time Calculation (min) (ACUTE ONLY): 42 min  Charges:  $Therapeutic Exercise: 23-37 mins $Therapeutic Activity: 8-22 mins                    G Codes:      Hodges,Dominic C Dec 28, 2014, 12:12 PM  12:15 PM  Dominic Hodges, PT, DPT [place] License # [id number]

## 2014-12-09 ENCOUNTER — Inpatient Hospital Stay
Admission: RE | Admit: 2014-12-09 | Discharge: 2014-12-29 | Disposition: A | Discharge status: Home / Self Care | Payer: Medicare Other | Source: Ambulatory Visit | Attending: Pulmonary Disease | Admitting: Pulmonary Disease

## 2014-12-09 DIAGNOSIS — K5901 Slow transit constipation: Principal | ICD-10-CM

## 2014-12-09 DIAGNOSIS — R51 Headache: Secondary | ICD-10-CM | POA: Diagnosis not present

## 2014-12-09 DIAGNOSIS — M6281 Muscle weakness (generalized): Secondary | ICD-10-CM | POA: Diagnosis not present

## 2014-12-09 DIAGNOSIS — Z471 Aftercare following joint replacement surgery: Secondary | ICD-10-CM | POA: Diagnosis not present

## 2014-12-09 DIAGNOSIS — R1312 Dysphagia, oropharyngeal phase: Secondary | ICD-10-CM | POA: Diagnosis not present

## 2014-12-09 DIAGNOSIS — R269 Unspecified abnormalities of gait and mobility: Secondary | ICD-10-CM | POA: Diagnosis not present

## 2014-12-09 DIAGNOSIS — E785 Hyperlipidemia, unspecified: Secondary | ICD-10-CM | POA: Diagnosis not present

## 2014-12-09 DIAGNOSIS — K567 Ileus, unspecified: Secondary | ICD-10-CM | POA: Diagnosis not present

## 2014-12-09 DIAGNOSIS — R278 Other lack of coordination: Secondary | ICD-10-CM | POA: Diagnosis not present

## 2014-12-09 DIAGNOSIS — Z9181 History of falling: Secondary | ICD-10-CM | POA: Diagnosis not present

## 2014-12-09 DIAGNOSIS — R262 Difficulty in walking, not elsewhere classified: Secondary | ICD-10-CM | POA: Diagnosis not present

## 2014-12-09 DIAGNOSIS — R1310 Dysphagia, unspecified: Secondary | ICD-10-CM | POA: Diagnosis not present

## 2014-12-09 DIAGNOSIS — R410 Disorientation, unspecified: Secondary | ICD-10-CM

## 2014-12-09 DIAGNOSIS — K560 Paralytic ileus: Secondary | ICD-10-CM

## 2014-12-09 DIAGNOSIS — G8918 Other acute postprocedural pain: Secondary | ICD-10-CM | POA: Diagnosis not present

## 2014-12-09 DIAGNOSIS — T17320A Food in larynx causing asphyxiation, initial encounter: Secondary | ICD-10-CM | POA: Diagnosis not present

## 2014-12-09 DIAGNOSIS — S72001D Fracture of unspecified part of neck of right femur, subsequent encounter for closed fracture with routine healing: Secondary | ICD-10-CM | POA: Diagnosis not present

## 2014-12-09 DIAGNOSIS — M84459A Pathological fracture, hip, unspecified, initial encounter for fracture: Secondary | ICD-10-CM | POA: Diagnosis not present

## 2014-12-09 DIAGNOSIS — R197 Diarrhea, unspecified: Secondary | ICD-10-CM | POA: Diagnosis not present

## 2014-12-09 DIAGNOSIS — R488 Other symbolic dysfunctions: Secondary | ICD-10-CM | POA: Diagnosis not present

## 2014-12-09 DIAGNOSIS — K5900 Constipation, unspecified: Secondary | ICD-10-CM | POA: Diagnosis not present

## 2014-12-09 DIAGNOSIS — R4182 Altered mental status, unspecified: Secondary | ICD-10-CM | POA: Diagnosis not present

## 2014-12-09 DIAGNOSIS — N400 Enlarged prostate without lower urinary tract symptoms: Secondary | ICD-10-CM | POA: Diagnosis not present

## 2014-12-09 DIAGNOSIS — R42 Dizziness and giddiness: Secondary | ICD-10-CM | POA: Diagnosis not present

## 2014-12-09 DIAGNOSIS — I639 Cerebral infarction, unspecified: Secondary | ICD-10-CM | POA: Diagnosis not present

## 2014-12-09 DIAGNOSIS — I10 Essential (primary) hypertension: Secondary | ICD-10-CM | POA: Diagnosis not present

## 2014-12-09 HISTORY — DX: Paralytic ileus: K56.0

## 2014-12-09 LAB — CBC
HEMATOCRIT: 30.8 % — AB (ref 39.0–52.0)
Hemoglobin: 10.3 g/dL — ABNORMAL LOW (ref 13.0–17.0)
MCH: 30.4 pg (ref 26.0–34.0)
MCHC: 33.4 g/dL (ref 30.0–36.0)
MCV: 90.9 fL (ref 78.0–100.0)
Platelets: 152 10*3/uL (ref 150–400)
RBC: 3.39 MIL/uL — AB (ref 4.22–5.81)
RDW: 12.9 % (ref 11.5–15.5)
WBC: 8.4 10*3/uL (ref 4.0–10.5)

## 2014-12-09 MED ORDER — HYDRALAZINE HCL 25 MG PO TABS: 25.0000 mg | ORAL_TABLET | Freq: Two times a day (BID) | ORAL | Status: DC

## 2014-12-09 MED ORDER — POLYETHYLENE GLYCOL 3350 17 G PO PACK: 17.0000 g | PACK | Freq: Every day | ORAL | Status: DC

## 2014-12-09 MED ORDER — HYDROCODONE-ACETAMINOPHEN 5-325 MG PO TABS: 1.0000 | ORAL_TABLET | ORAL | Status: DC | PRN

## 2014-12-09 MED ORDER — BACLOFEN 20 MG PO TABS: 20.0000 mg | ORAL_TABLET | Freq: Two times a day (BID) | ORAL | Status: DC

## 2014-12-09 MED ORDER — SENNOSIDES-DOCUSATE SODIUM 8.6-50 MG PO TABS: 1.0000 | ORAL_TABLET | Freq: Every evening | ORAL | Status: DC | PRN

## 2014-12-09 MED ORDER — BISACODYL 5 MG PO TBEC: 5.0000 mg | DELAYED_RELEASE_TABLET | Freq: Every day | ORAL | Status: DC | PRN

## 2014-12-09 MED ORDER — MENTHOL 3 MG MT LOZG: 1.0000 | LOZENGE | OROMUCOSAL | Status: DC | PRN

## 2014-12-09 MED ORDER — DOCUSATE SODIUM 100 MG PO CAPS: 100.0000 mg | ORAL_CAPSULE | Freq: Two times a day (BID) | ORAL | Status: DC

## 2014-12-09 NOTE — Discharge Summary (Addendum)
Physician Discharge Summary  Patient ID: Dominic Hodges MRN: [id number] DOB/AGE: 1927/05/07 81 y.o. Primary Care Physician:Letzy Gullickson L, MD Admit date: 12/05/2014 Discharge date: 12/09/2014    Discharge Diagnoses:   Principal Problem:   Closed right hip fracture [facility]) Active Problems:   Closed hip fracture ([place])   Hypertension   BPH (benign prostatic hyperplasia)   Hyperlipidemia     Medication List    STOP taking these medications        azithromycin 250 MG tablet  Commonly known as:  ZITHROMAX     cephALEXin 500 MG capsule  Commonly known as:  KEFLEX      TAKE these medications        aspirin 81 MG EC tablet  Take 81 mg by mouth daily.     baclofen 20 MG tablet  Commonly known as:  LIORESAL  Take 1 tablet (20 mg total) by mouth 2 (two) times daily.     bisacodyl 5 MG EC tablet  Commonly known as:  DULCOLAX  Take 1 tablet (5 mg total) by mouth daily as needed for moderate constipation.     BYSTOLIC 20 MG Tabs  Generic drug:  Nebivolol HCl  Take 20 mg by mouth daily.     cloNIDine 0.3 mg/24hr patch  Commonly known as:  CATAPRES - Dosed in mg/24 hr  Place 0.3 mg onto the skin once a week.     docusate sodium 100 MG capsule  Commonly known as:  COLACE  Take 1 capsule (100 mg total) by mouth 2 (two) times daily.     dutasteride 0.5 MG capsule  Commonly known as:  AVODART  Take 0.5 mg by mouth daily.     EDARBI 40 MG Tabs  Generic drug:  Azilsartan Medoxomil  Take 1 tablet by mouth daily.     hydrALAZINE 25 MG tablet  Commonly known as:  APRESOLINE  Take 1 tablet (25 mg total) by mouth 2 (two) times daily.     HYDROcodone-acetaminophen 5-325 MG tablet  Commonly known as:  NORCO/VICODIN  Take 1 tablet by mouth every 4 (four) hours as needed for moderate pain.     menthol-cetylpyridinium 3 MG lozenge  Commonly known as:  CEPACOL  Take 1 lozenge (3 mg total) by mouth as needed for sore throat (sore throat).     polyethylene glycol packet   Commonly known as:  MIRALAX / GLYCOLAX  Take 17 g by mouth daily.     rosuvastatin 20 MG tablet  Commonly known as:  CRESTOR  Take 20 mg by mouth daily.     senna-docusate 8.6-50 MG tablet  Commonly known as:  Senokot-S  Take 1 tablet by mouth at bedtime as needed for mild constipation.        Discharged Condition: Improved    Consults: Dr. Aline Brochure  Significant Diagnostic Studies: Dg Chest 1 View  12/05/2014  CLINICAL DATA:  Fall doing yard work.  Hip pain. EXAM: CHEST  1 VIEW COMPARISON:  Two-view chest x-ray 07/17/2014. FINDINGS: The heart size is normal. The lungs are clear. The visualized soft tissues and bony thorax are unremarkable. IMPRESSION: Negative one-view chest x-ray Electronically Signed   By: San Morelle M.D.   On: 12/05/2014 18:16   Dg Pelvis Portable  12/06/2014  CLINICAL DATA:  Right total hip replacement, postop. EXAM: PORTABLE PELVIS 1-2 VIEWS COMPARISON:  One day prior FINDINGS: Interval right hip arthroplasty. No hardware complication or periprosthetic fracture identified. Left hip joint space maintained. IMPRESSION: Expected appearance  after right hip arthroplasty. Electronically Signed   By: Abigail Miyamoto M.D.   On: 12/06/2014 14:47   Dg Hip Unilat With Pelvis 2-3 Views Right  12/05/2014  CLINICAL DATA:  Status post fall while doing yard work, with right hip pain. Initial encounter. EXAM: DG HIP (WITH OR WITHOUT PELVIS) 2-3V RIGHT COMPARISON:  None. FINDINGS: There is a mildly displaced subcapital fracture through the right femoral neck. The right femoral head remains seated at the acetabulum. There is mild superior displacement of the distal femur. The left hip joint is grossly unremarkable. No significant degenerative change is appreciated. The sacroiliac joints are unremarkable in appearance. The visualized bowel gas pattern is grossly unremarkable in appearance. IMPRESSION: Mildly displaced subcapital fracture through the right femoral neck.  Electronically Signed   By: Garald Balding M.D.   On: 12/05/2014 18:18    Lab Results: Basic Metabolic Panel:  Recent Labs  12/07/14 0630 12/08/14 0554  NA 138 137  K 4.6 4.3  CL 107 108  CO2 26 27  GLUCOSE 100* 123*  BUN 27* 32*  CREATININE 1.49* 1.79*  CALCIUM 8.0* 7.8*   Liver Function Tests: No results for input(s): AST, ALT, ALKPHOS, BILITOT, PROT, ALBUMIN in the last 72 hours.   CBC:  Recent Labs  12/08/14 0554 12/09/14 0624  WBC 8.5 8.4  HGB 9.9* 10.3*  HCT 28.8* 30.8*  MCV 90.9 90.9  PLT 128* 152    Recent Results (from the past 240 hour(s))  Surgical pcr screen     Status: None   Collection Time: 12/05/14  9:41 PM  Result Value Ref Range Status   MRSA, PCR NEGATIVE NEGATIVE Final   Staphylococcus aureus NEGATIVE NEGATIVE Final    Comment:        The Xpert SA Assay (FDA approved for NASAL specimens in patients over 27 years of age), is one component of a comprehensive surveillance program.  Test performance has been validated by [facility] for patients greater than or equal to 11 year old. It is not intended to diagnose infection nor to guide or monitor treatment.   Culture, Urine     Status: None   Collection Time: 12/07/14 10:32 AM  Result Value Ref Range Status   Specimen Description URINE, RANDOM  Final   Special Requests none  Final   Culture   Final    NO GROWTH 1 DAY Performed at [facility]    Report Status 12/08/2014 FINAL  Final     Hospital Course: This is an 81 year old who fell in his yard suffering a right hip fracture. At baseline he has hypertension mild chronic renal failure and BPH. He underwent a partial right hip replacement on 12/06/2014. He had physical therapy , Occupational therapy while he was in the hospital. His renal function has remained relatively stable. His blood pressure has been up and down somewhat so he's had modification of his blood pressure medications. He is going to go to skilled care  facility for rehabilitation. He is stable for transfer Discharge Exam: Blood pressure 131/51, pulse 65, temperature 98.4 F (36.9 C), temperature source Oral, resp. rate 20, height 6\' 1"  (1.854 m), weight 75.615 kg (166 lb 11.2 oz), SpO2 95 %. He is awake and alert. His chest is clear. His heart is regular.  Disposition: Transfer to skilled care facility for rehabilitation. He will need PT OT. He will be on a no added salt regular diet. He will need basic metabolic profile on [id number]. Oxygen at 2  L if needed for O2 sat less than 89%      Discharge Instructions    Discharge to SNF when bed available    Complete by:  As directed      Discharge to SNF when bed available    Complete by:  As directed              Signed: Deem Marmol L   12/09/2014, 9:35 AM

## 2014-12-09 NOTE — Progress Notes (Signed)
He feels better. His blood pressure has been somewhat low but this morning his back around 130/50. He has no other complaints. He is scheduled to be transferred to skilled care facility today and I think he is ready. Please see discharge summary for details.

## 2014-12-09 NOTE — Care Management Note (Signed)
Case Management Note  Patient Details  Name: Thurmond Famiglietti MRN: [id number] Date of Birth: 06/08/1927  Pt is discharging to [facility] SNF today. CSW has arranged for placement. Pt, family and RN aware of DC plan. No CM needs.   Expected Discharge Date:                  Expected Discharge Plan:  Skilled Nursing Facility  In-House Referral:  Clinical Social Work  Discharge planning Services  CM Consult  Post Acute Care Choice:  NA Choice offered to:  NA  DME Arranged:    DME Agency:     HH Arranged:    [place] Agency:     Status of Service:  Completed, signed off  Medicare Important Message Given:  Yes Date Medicare IM Given:    Medicare IM give by:    Date Additional Medicare IM Given:    Additional Medicare Important Message give by:     If discussed at [place] of Stay Meetings, dates discussed:    Additional Comments:  Sherald Barge, RN 12/09/2014, 11:23 AM

## 2014-12-09 NOTE — Care Management Important Message (Signed)
Important Message  Patient Details  Name: Isaias Bozard MRN: [id number] Date of Birth: April 26, 1927   Medicare Important Message Given:  Yes    Sherald Barge, RN 12/09/2014, 11:21 AM

## 2014-12-09 NOTE — Clinical Social Work Placement (Signed)
   CLINICAL SOCIAL WORK PLACEMENT  NOTE  Date:  12/09/2014  Patient Details  Name: Dominic Hodges MRN: [id number] Date of Birth: 11-07-1927  Clinical Social Work is seeking post-discharge placement for this patient at the [place] level of care (*CSW will initial, date and re-position this form in  chart as items are completed):  Yes   Patient/family provided with [place] Work Department's list of facilities offering this level of care within the geographic area requested by the patient (or if unable, by the patient's family).  Yes   Patient/family informed of their freedom to choose among providers that offer the needed level of care, that participate in Medicare, Medicaid or managed care program needed by the patient, have an available bed and are willing to accept the patient.  Yes   Patient/family informed of [place]'s ownership interest in [facility] and [facility], as well as of the fact that they are under no obligation to receive care at these facilities.  PASRR submitted to EDS on 12/07/14     PASRR number received on 12/07/14     Existing PASRR number confirmed on       FL2 transmitted to all facilities in geographic area requested by pt/family on 12/07/14     FL2 transmitted to all facilities within larger geographic area on       Patient informed that his/her managed care company has contracts with or will negotiate with certain facilities, including the following:        Yes   Patient/family informed of bed offers received.  Patient chooses bed at [facility]     Physician recommends and patient chooses bed at      Patient to be transferred to [facility] on  12/09/14.  Patient to be transferred to facility by staff     Patient family notified on 12/09/14 of transfer.  Name of family member notified:  Pt notified his wife, Dominic Hodges while [place] in room     PHYSICIAN       Additional Comment:     _______________________________________________ Salome Arnt, [place] 12/09/2014, 8:57 AM [phone]

## 2014-12-09 NOTE — Progress Notes (Signed)
Notified MD of patient low blood pressure.  Received order to hold blood pressure medicines for now.  Will continue to monitor patient. Placed patient on oxygen at 2 liters.

## 2014-12-10 ENCOUNTER — Encounter (HOSPITAL_COMMUNITY)
Admission: RE | Admit: 2014-12-10 | Discharge: 2014-12-10 | Disposition: A | Discharge status: Home / Self Care | Payer: Medicare Other | Source: Skilled Nursing Facility | Attending: Pulmonary Disease | Admitting: Pulmonary Disease

## 2014-12-10 LAB — BASIC METABOLIC PANEL
ANION GAP: 6 (ref 5–15)
BUN: 30 mg/dL — ABNORMAL HIGH (ref 6–20)
CHLORIDE: 106 mmol/L (ref 101–111)
CO2: 27 mmol/L (ref 22–32)
Calcium: 8 mg/dL — ABNORMAL LOW (ref 8.9–10.3)
Creatinine, Ser: 1.49 mg/dL — ABNORMAL HIGH (ref 0.61–1.24)
GFR calc Af Amer: 48 mL/min — ABNORMAL LOW (ref >60)
GFR, EST NON AFRICAN AMERICAN: 41 mL/min — AB (ref >60)
GLUCOSE: 116 mg/dL — AB (ref 65–99)
POTASSIUM: 3.8 mmol/L (ref 3.5–5.1)
SODIUM: 139 mmol/L (ref 135–145)

## 2014-12-11 LAB — TYPE AND SCREEN
ABO/RH(D): A NEG
ANTIBODY SCREEN: NEGATIVE
UNIT DIVISION: 0
Unit division: 0

## 2014-12-12 ENCOUNTER — Encounter (HOSPITAL_COMMUNITY)
Admission: RE | Admit: 2014-12-12 | Discharge: 2014-12-12 | Disposition: A | Discharge status: Home / Self Care | Payer: Medicare Other | Source: Skilled Nursing Facility | Attending: Pulmonary Disease | Admitting: Pulmonary Disease

## 2014-12-12 ENCOUNTER — Inpatient Hospital Stay (HOSPITAL_COMMUNITY): Payer: Medicare Other | Attending: Pulmonary Disease

## 2014-12-12 DIAGNOSIS — M84459A Pathological fracture, hip, unspecified, initial encounter for fracture: Secondary | ICD-10-CM | POA: Diagnosis not present

## 2014-12-12 DIAGNOSIS — I10 Essential (primary) hypertension: Secondary | ICD-10-CM | POA: Diagnosis not present

## 2014-12-12 DIAGNOSIS — I639 Cerebral infarction, unspecified: Secondary | ICD-10-CM | POA: Diagnosis not present

## 2014-12-12 LAB — COMPREHENSIVE METABOLIC PANEL
ALBUMIN: 2.7 g/dL — AB (ref 3.5–5.0)
ALT: 56 U/L (ref 17–63)
ANION GAP: 10 (ref 5–15)
AST: 71 U/L — ABNORMAL HIGH (ref 15–41)
Alkaline Phosphatase: 58 U/L (ref 38–126)
BUN: 31 mg/dL — ABNORMAL HIGH (ref 6–20)
CHLORIDE: 105 mmol/L (ref 101–111)
CO2: 27 mmol/L (ref 22–32)
Calcium: 8.6 mg/dL — ABNORMAL LOW (ref 8.9–10.3)
Creatinine, Ser: 1.3 mg/dL — ABNORMAL HIGH (ref 0.61–1.24)
GFR calc non Af Amer: 48 mL/min — ABNORMAL LOW (ref >60)
GFR, EST AFRICAN AMERICAN: 56 mL/min — AB (ref >60)
GLUCOSE: 133 mg/dL — AB (ref 65–99)
POTASSIUM: 3.8 mmol/L (ref 3.5–5.1)
SODIUM: 142 mmol/L (ref 135–145)
Total Bilirubin: 1.3 mg/dL — ABNORMAL HIGH (ref 0.3–1.2)
Total Protein: 5.5 g/dL — ABNORMAL LOW (ref 6.5–8.1)

## 2014-12-12 LAB — AMMONIA: Ammonia: 11 umol/L (ref 9–35)

## 2014-12-14 ENCOUNTER — Encounter: Payer: Self-pay | Admitting: Neurology

## 2014-12-14 NOTE — Progress Notes (Unsigned)
Patient ID: Dominic Hodges, male   DOB: [birth year], 81 y.o.   MRN: [id number]  [place] A. Dominic Laughter, MD     [email or web address]          Dominic Hodges is an 81 y.o. male.   ASSESSMENT/PLAN: 1. Improving toxic metabolic encephalopathy. Potential etiologies includes urinary tract infection, dehydration and medication effect. 2. Migraine headaches. 3. Nonspecific dizziness likely due to medication effect possible dehydration. 4. Gait impairment status post right hip fracture and fusion. 5. Abdominal pain nausea vomiting. Likely multifactorial including medication effect and postop ileus.  RECOMMENDATION: Labs for the following: CBC, complex metabolic profile, TSH, vitamin B12 and a urinalysis. Avoid sedatives. Consider discontinuing or reducing baclofen.  The patient 81 year old white male who was admitted to the hospital recently for right hip fracture. He fell at home. He had surgery and did well but it appears that he has had some episodic confusion since his been at a skilled facility receiving rehabilitation. He also has had some nausea, vomiting and frequent loose stools. The nurse reports that he has had some anorexia. He seems mostly lucid today but he's mentioning his abdominal discomfort, nausea and dry heaving he had last night. He had a large bowel movement just recently reports that his abdominal discomfort has improved dramatically. He still reports having some nausea however. He complains of some lightheadedness described mostly as dizziness. He denies focal numbness or weakness. There is no chest pain or shortness of breath at this time. The patient has had an episode of scotoma which he tells me happens from time to time due to baseline history of migraine headaches. He tablet takes a Maxalt at the onset of scotoma which is very effective for treating this scotoma.  GENERAL: Pleasant in no acute distress.  HEENT: Supple. Atraumatic normocephalic.    ABDOMEN: soft  EXTREMITIES: No edema   BACK: Normal.  SKIN: Normal by inspection.    MENTAL STATUS: He is awake and alert and generally lucid and intact. He states the date as December 12, 2014. He knows that he is in the rehabilitation at [facility]. He does tend to repeat things however especially since be concerned about his abdominal symptoms.  CRANIAL NERVES: Pupils are equal, round and reactive to light and accommodation; extra ocular movements are full, there is no significant nystagmus; visual fields are full; upper and lower facial muscles are normal in strength and symmetric, there is no flattening of the nasolabial folds; tongue is midline; uvula is midline; shoulder elevation is normal.  MOTOR: Normal tone, bulk and strength; no pronator drift.  COORDINATION: Left finger to nose is normal, right finger to nose is normal, No rest tremor; no intention tremor; no postural tremor; no bradykinesia.  REFLEXES: Deep tendon reflexes are symmetrical and normal. Babinski reflexes are flexor bilaterally.   SENSATION: Normal to light touch.  GAIT: Seated in a chair.   There were no vitals taken for this visit.  Past Medical History  Diagnosis Date  . Hypertension   . Hyperlipidemia   . BPH (benign prostatic hyperplasia)     Past Surgical History  Procedure Laterality Date  . Appendectomy      approx 30 years ago/lwb  . Umbilical hernia repair      Approx 10 years ago/lwb  . Ureteral stone extraction    . Cataract extraction w/phaco Left 09/11/2012    Procedure: CATARACT EXTRACTION PHACO AND INTRAOCULAR LENS PLACEMENT (IOC);  Surgeon: Tonny Branch, MD;  Location:  [facility];  Service: Ophthalmology;  Laterality: Left;  CDE: 19.52  . Hip arthroplasty Right 12/06/2014    Procedure: PARTIAL RIGHT HIP REPLACEMENT;  Surgeon: Carole Civil, MD;  Location: [facility];  Service: Orthopedics;  Laterality: Right;    Family History  Problem Relation Age of Onset  . Coronary  artery disease      no family history of premature CAD    Social History:  reports that he has quit smoking. His smoking use included Pipe. He does not have any smokeless tobacco history on file. He reports that he drinks alcohol. He reports that he does not use illicit drugs.  Allergies: No Known Allergies  Medications: Prior to Admission medications   Medication Sig Start Date End Date Taking? Authorizing Provider  aspirin 81 MG EC tablet Take 81 mg by mouth daily.      Historical Provider, MD  Azilsartan Medoxomil (EDARBI) 40 MG TABS Take 1 tablet by mouth daily.    Historical Provider, MD  baclofen (LIORESAL) 20 MG tablet Take 1 tablet (20 mg total) by mouth 2 (two) times daily. 12/09/14   Sinda Du, MD  bisacodyl (DULCOLAX) 5 MG EC tablet Take 1 tablet (5 mg total) by mouth daily as needed for moderate constipation. 12/09/14   Sinda Du, MD  cloNIDine (CATAPRES - DOSED IN MG/24 HR) 0.3 mg/24hr patch Place 0.3 mg onto the skin once a week.  11/15/14   Historical Provider, MD  docusate sodium (COLACE) 100 MG capsule Take 1 capsule (100 mg total) by mouth 2 (two) times daily. 12/09/14   Sinda Du, MD  dutasteride (AVODART) 0.5 MG capsule Take 0.5 mg by mouth daily.     Historical Provider, MD  hydrALAZINE (APRESOLINE) 25 MG tablet Take 1 tablet (25 mg total) by mouth 2 (two) times daily. 12/09/14   Sinda Du, MD  HYDROcodone-acetaminophen (NORCO/VICODIN) 5-325 MG tablet Take 1 tablet by mouth every 4 (four) hours as needed for moderate pain. 12/09/14   Sinda Du, MD  menthol-cetylpyridinium (CEPACOL) 3 MG lozenge Take 1 lozenge (3 mg total) by mouth as needed for sore throat (sore throat). 12/09/14   Sinda Du, MD  Nebivolol HCl (BYSTOLIC) 20 MG TABS Take 20 mg by mouth daily.    Historical Provider, MD  polyethylene glycol (MIRALAX / GLYCOLAX) packet Take 17 g by mouth daily. 12/09/14   Sinda Du, MD  rosuvastatin (CRESTOR) 20 MG tablet Take 20 mg by mouth daily.       Historical Provider, MD  senna-docusate (SENOKOT-S) 8.6-50 MG tablet Take 1 tablet by mouth at bedtime as needed for mild constipation. 12/09/14   Sinda Du, MD    Scheduled Meds: Continuous Infusions: PRN Meds:.     No results found for this or any previous visit (from the past 48 hour(s)).  Studies/Results:     Shanetha Bradham A. Dominic Hodges, M.D.  Diplomate, Tax adviser of Psychiatry and Neurology ( Neurology). 12/14/2014, 1:39 PM

## 2014-12-15 ENCOUNTER — Encounter (HOSPITAL_COMMUNITY)
Admission: AD | Admit: 2014-12-15 | Discharge: 2014-12-15 | Disposition: A | Discharge status: Home / Self Care | Payer: Medicare Other | Source: Skilled Nursing Facility | Attending: Pulmonary Disease | Admitting: Pulmonary Disease

## 2014-12-15 LAB — URINALYSIS, ROUTINE W REFLEX MICROSCOPIC
BILIRUBIN URINE: NEGATIVE
GLUCOSE, UA: NEGATIVE mg/dL
HGB URINE DIPSTICK: NEGATIVE
KETONES UR: NEGATIVE mg/dL
Leukocytes, UA: NEGATIVE
NITRITE: NEGATIVE
PH: 5.5 (ref 5.0–8.0)
Protein, ur: NEGATIVE mg/dL
Specific Gravity, Urine: 1.025 (ref 1.005–1.030)

## 2014-12-15 LAB — CBC WITH DIFFERENTIAL/PLATELET
BASOS ABS: 0 10*3/uL (ref 0.0–0.1)
BASOS PCT: 0 %
EOS PCT: 1 %
Eosinophils Absolute: 0.2 10*3/uL (ref 0.0–0.7)
HCT: 35.9 % — ABNORMAL LOW (ref 39.0–52.0)
Hemoglobin: 12 g/dL — ABNORMAL LOW (ref 13.0–17.0)
LYMPHS PCT: 15 %
Lymphs Abs: 1.8 10*3/uL (ref 0.7–4.0)
MCH: 29.9 pg (ref 26.0–34.0)
MCHC: 33.4 g/dL (ref 30.0–36.0)
MCV: 89.3 fL (ref 78.0–100.0)
Monocytes Absolute: 0.9 10*3/uL (ref 0.1–1.0)
Monocytes Relative: 7 %
Neutro Abs: 9.5 10*3/uL — ABNORMAL HIGH (ref 1.7–7.7)
Neutrophils Relative %: 77 %
PLATELETS: 341 10*3/uL (ref 150–400)
RBC: 4.02 MIL/uL — ABNORMAL LOW (ref 4.22–5.81)
RDW: 12.5 % (ref 11.5–15.5)
WBC: 12.4 10*3/uL — AB (ref 4.0–10.5)

## 2014-12-15 LAB — COMPREHENSIVE METABOLIC PANEL
ALBUMIN: 3 g/dL — AB (ref 3.5–5.0)
ALK PHOS: 61 U/L (ref 38–126)
ALT: 62 U/L (ref 17–63)
ANION GAP: 7 (ref 5–15)
AST: 45 U/L — AB (ref 15–41)
BILIRUBIN TOTAL: 1 mg/dL (ref 0.3–1.2)
BUN: 43 mg/dL — AB (ref 6–20)
CO2: 30 mmol/L (ref 22–32)
Calcium: 8.2 mg/dL — ABNORMAL LOW (ref 8.9–10.3)
Chloride: 103 mmol/L (ref 101–111)
Creatinine, Ser: 1.72 mg/dL — ABNORMAL HIGH (ref 0.61–1.24)
GFR calc Af Amer: 40 mL/min — ABNORMAL LOW (ref >60)
GFR calc non Af Amer: 34 mL/min — ABNORMAL LOW (ref >60)
Glucose, Bld: 123 mg/dL — ABNORMAL HIGH (ref 65–99)
POTASSIUM: 3.3 mmol/L — AB (ref 3.5–5.1)
SODIUM: 140 mmol/L (ref 135–145)
TOTAL PROTEIN: 5.8 g/dL — AB (ref 6.5–8.1)

## 2014-12-15 LAB — TSH: TSH: 2.382 u[IU]/mL (ref 0.350–4.500)

## 2014-12-15 LAB — C DIFFICILE QUICK SCREEN W PCR REFLEX
C DIFFICILE (CDIFF) TOXIN: NEGATIVE
C DIFFICLE (CDIFF) ANTIGEN: NEGATIVE
C Diff interpretation: NEGATIVE

## 2014-12-15 LAB — VITAMIN B12: VITAMIN B 12: 870 pg/mL (ref 180–914)

## 2014-12-15 NOTE — H&P (Signed)
Dominic Hodges MRN: [id number] DOB/AGE: [birth year] 79 y.o. Primary Care Physician:Livy Ross L, MD Admit date: 12/09/2014 Chief Complaint: Rehabilitation HPI: This is documentation of my history and physical done on 12/12/2014 at the skilled care facility. Dominic Hodges is an 81 year old who has a past medical history of hypertension hyperlipidemia BPH and chronic renal failure . He was working in his yard and fell and was found to have a right hip fracture. Dr. Aline Brochure was consulted and he ended up having partial hip replacement. He recovered in the hospital and was felt to need rehabilitation. What I came in to see him he was confused which is not his normal state. He kept repeating himself and clearly was not his normal self. This apparently started yesterday. It does not seem to be related to medication.  Past Medical History  Diagnosis Date  . Hypertension   . Hyperlipidemia   . BPH (benign prostatic hyperplasia)    Past Surgical History  Procedure Laterality Date  . Appendectomy      approx 30 years ago/lwb  . Umbilical hernia repair      Approx 10 years ago/lwb  . Ureteral stone extraction    . Cataract extraction w/phaco Left 09/11/2012    Procedure: CATARACT EXTRACTION PHACO AND INTRAOCULAR LENS PLACEMENT (IOC);  Surgeon: Tonny Branch, MD;  Location: [facility];  Service: Ophthalmology;  Laterality: Left;  CDE: 19.52  . Hip arthroplasty Right 12/06/2014    Procedure: PARTIAL RIGHT HIP REPLACEMENT;  Surgeon: Carole Civil, MD;  Location: [facility];  Service: Orthopedics;  Laterality: Right;        Family History  Problem Relation Age of Onset  . Coronary artery disease      no family history of premature CAD    Social History:  reports that he has quit smoking. His smoking use included Pipe. He does not have any smokeless tobacco history on file. He reports that he drinks alcohol. He reports that he does not use illicit drugs.   Allergies: No Known  Allergies  Medications Prior to Admission  Medication Sig Dispense Refill  . aspirin 81 MG EC tablet Take 81 mg by mouth daily.      . Azilsartan Medoxomil (EDARBI) 40 MG TABS Take 1 tablet by mouth daily.    . baclofen (LIORESAL) 20 MG tablet Take 1 tablet (20 mg total) by mouth 2 (two) times daily. 30 each 0  . bisacodyl (DULCOLAX) 5 MG EC tablet Take 1 tablet (5 mg total) by mouth daily as needed for moderate constipation. 30 tablet 0  . cloNIDine (CATAPRES - DOSED IN MG/24 HR) 0.3 mg/24hr patch Place 0.3 mg onto the skin once a week.   1  . docusate sodium (COLACE) 100 MG capsule Take 1 capsule (100 mg total) by mouth 2 (two) times daily. 10 capsule 0  . dutasteride (AVODART) 0.5 MG capsule Take 0.5 mg by mouth daily.     . hydrALAZINE (APRESOLINE) 25 MG tablet Take 1 tablet (25 mg total) by mouth 2 (two) times daily.    Marland Kitchen HYDROcodone-acetaminophen (NORCO/VICODIN) 5-325 MG tablet Take 1 tablet by mouth every 4 (four) hours as needed for moderate pain. 30 tablet 0  . menthol-cetylpyridinium (CEPACOL) 3 MG lozenge Take 1 lozenge (3 mg total) by mouth as needed for sore throat (sore throat). 100 tablet 12  . Nebivolol HCl (BYSTOLIC) 20 MG TABS Take 20 mg by mouth daily.    . polyethylene glycol (MIRALAX / GLYCOLAX) packet Take 17 g by  mouth daily. 14 each 0  . rosuvastatin (CRESTOR) 20 MG tablet Take 20 mg by mouth daily.      Marland Kitchen senna-docusate (SENOKOT-S) 8.6-50 MG tablet Take 1 tablet by mouth at bedtime as needed for mild constipation.         [id number] from the symptoms mentioned above,there are no other symptoms referable to all systems reviewed.  Physical Exam: There were no vitals taken for this visit. He is awake and alert but confused. He is undressed. His pupils are reactive nose and throat are clear his mucous membranes are moist his neck is supple without masses. His chest is clear. His heart is regular with a soft systolic murmur. His abdomen is soft without masses.  Extremities showed no edema. He does not have any carotid bruits. Central nervous system examination shows that he is confused but has no focal findings   No results for input(s): WBC, NEUTROABS, HGB, HCT, MCV, PLT in the last 72 hours.  Recent Labs  12/12/14 1145  NA 142  K 3.8  CL 105  CO2 27  GLUCOSE 133*  BUN 31*  CREATININE 1.30*  CALCIUM 8.6*  lablast2(ast:2,ALT:2,alkphos:2,bilitot:2,prot:2,albumin:2)@    Recent Results (from the past 240 hour(s))  Surgical pcr screen     Status: None   Collection Time: 12/05/14  9:41 PM  Result Value Ref Range Status   MRSA, PCR NEGATIVE NEGATIVE Final   Staphylococcus aureus NEGATIVE NEGATIVE Final    Comment:        The Xpert SA Assay (FDA approved for NASAL specimens in patients over 64 years of age), is one component of a comprehensive surveillance program.  Test performance has been validated by [facility] for patients greater than or equal to 23 year old. It is not intended to diagnose infection nor to guide or monitor treatment.   Culture, Urine     Status: None   Collection Time: 12/07/14 10:32 AM  Result Value Ref Range Status   Specimen Description URINE, RANDOM  Final   Special Requests none  Final   Culture   Final    NO GROWTH 1 DAY Performed at [facility]    Report Status 12/08/2014 FINAL  Final     Dg Chest 1 View  12/05/2014  CLINICAL DATA:  Fall doing yard work.  Hip pain. EXAM: CHEST  1 VIEW COMPARISON:  Two-view chest x-ray 07/17/2014. FINDINGS: The heart size is normal. The lungs are clear. The visualized soft tissues and bony thorax are unremarkable. IMPRESSION: Negative one-view chest x-ray Electronically Signed   By: San Morelle M.D.   On: 12/05/2014 18:16   Ct Head Wo Contrast  12/12/2014  CLINICAL DATA:  Confusion.  Recent fall with head injury. EXAM: CT HEAD WITHOUT CONTRAST TECHNIQUE: Contiguous axial images were obtained from the base of the skull through the vertex without  intravenous contrast. COMPARISON:  None. FINDINGS: There is a mild scalp contusion at the posterior vertex. No evidence of parenchymal hemorrhage or extra-axial fluid collection. No mass lesion, mass effect, or midline shift. No CT evidence of acute infarction. Small remote infarct in the right cerebellar hemisphere. Intracranial atherosclerosis Nonspecific mild subcortical and periventricular white matter hypodensity, most in keeping with chronic small vessel ischemic change. Cerebral volume is age appropriate. No ventriculomegaly. The visualized paranasal sinuses are essentially clear. The mastoid air cells are unopacified. No evidence of calvarial fracture. Prominent arachnoid granulation in the right superior parietal calvarium. IMPRESSION: 1. Mild posterior vertex scalp contusion. No evidence of  acute intracranial abnormality. No calvarial. 2. Intracranial atherosclerosis, small remote right cerebellar hemisphere infarct and mild chronic small vessel ischemic white matter change. Electronically Signed   By: Ilona Sorrel M.D.   On: 12/12/2014 13:09   Dg Pelvis Portable  12/06/2014  CLINICAL DATA:  Right total hip replacement, postop. EXAM: PORTABLE PELVIS 1-2 VIEWS COMPARISON:  One day prior FINDINGS: Interval right hip arthroplasty. No hardware complication or periprosthetic fracture identified. Left hip joint space maintained. IMPRESSION: Expected appearance after right hip arthroplasty. Electronically Signed   By: Abigail Miyamoto M.D.   On: 12/06/2014 14:47   Dg Hip Unilat With Pelvis 2-3 Views Right  12/05/2014  CLINICAL DATA:  Status post fall while doing yard work, with right hip pain. Initial encounter. EXAM: DG HIP (WITH OR WITHOUT PELVIS) 2-3V RIGHT COMPARISON:  None. FINDINGS: There is a mildly displaced subcapital fracture through the right femoral neck. The right femoral head remains seated at the acetabulum. There is mild superior displacement of the distal femur. The left hip joint is grossly  unremarkable. No significant degenerative change is appreciated. The sacroiliac joints are unremarkable in appearance. The visualized bowel gas pattern is grossly unremarkable in appearance. IMPRESSION: Mildly displaced subcapital fracture through the right femoral neck. Electronically Signed   By: Garald Balding M.D.   On: 12/05/2014 18:18   Impression: He had hip fracture and is in the skilled care facility for rehabilitation. He has hypertension which is pre-well controlled. He has chronic renal failure which is doing better. He is now confused and I'm not sure what this is from. He may have some element of dementia at baseline and being out of his normal environment may have set this off. I will discuss with his family. Active Problems:   * No active hospital problems. *     Plan: Continue current treatments. Check CT. Check laboratory work. Request neurology consultation depending on the results of above      Tyrene Nader L   12/15/2014, 7:56 AM

## 2014-12-16 ENCOUNTER — Ambulatory Visit (HOSPITAL_COMMUNITY)
Admit: 2014-12-16 | Discharge: 2014-12-16 | Disposition: A | Discharge status: Home / Self Care | Payer: No Typology Code available for payment source | Source: Ambulatory Visit | Attending: Pulmonary Disease | Admitting: Pulmonary Disease

## 2014-12-16 DIAGNOSIS — R197 Diarrhea, unspecified: Secondary | ICD-10-CM | POA: Insufficient documentation

## 2014-12-16 DIAGNOSIS — R410 Disorientation, unspecified: Secondary | ICD-10-CM | POA: Diagnosis not present

## 2014-12-16 DIAGNOSIS — K5901 Slow transit constipation: Secondary | ICD-10-CM | POA: Diagnosis not present

## 2014-12-16 DIAGNOSIS — K567 Ileus, unspecified: Secondary | ICD-10-CM | POA: Diagnosis not present

## 2014-12-16 DIAGNOSIS — K5900 Constipation, unspecified: Secondary | ICD-10-CM | POA: Diagnosis not present

## 2014-12-16 DIAGNOSIS — I10 Essential (primary) hypertension: Secondary | ICD-10-CM | POA: Diagnosis not present

## 2014-12-17 LAB — URINE CULTURE: CULTURE: NO GROWTH

## 2014-12-18 NOTE — Progress Notes (Signed)
This is documentation of my visit of 12/15/2014. He is still confused but seems a little better. He is able to list the things he wants me to know about. He has been having what seems to have been a postoperative ileus but that has resolved. He's doing better with physical therapy. He is still clearly confused. He is not having any breathing issues. He apparently had some trouble swallowing and wants to start on Dukes mixture. Exam shows he is awake and alert. His chest is clear. His heart is regular. He is less confused but not back to baseline.

## 2014-12-22 ENCOUNTER — Ambulatory Visit (HOSPITAL_COMMUNITY): Payer: Medicare Other | Attending: Pulmonary Disease | Admitting: Speech Pathology

## 2014-12-22 ENCOUNTER — Ambulatory Visit (HOSPITAL_COMMUNITY)
Admit: 2014-12-22 | Discharge: 2014-12-22 | Disposition: A | Discharge status: Home / Self Care | Payer: No Typology Code available for payment source | Source: Ambulatory Visit | Attending: Pulmonary Disease | Admitting: Pulmonary Disease

## 2014-12-22 DIAGNOSIS — R1310 Dysphagia, unspecified: Secondary | ICD-10-CM | POA: Diagnosis not present

## 2014-12-22 DIAGNOSIS — T17320A Food in larynx causing asphyxiation, initial encounter: Secondary | ICD-10-CM | POA: Diagnosis not present

## 2014-12-22 NOTE — Therapy (Signed)
[address] [place], [place], [address] Phone: [phone]   Fax:  [phone]  Modified Barium Swallow  Patient Details  Name: Dominic Hodges MRN: [id number] Date of Birth: 1927/09/19 No Data Recorded  Encounter Date: 12/22/2014      End of Session - 12/22/14 1501    Visit Number 1   Number of Visits 1   Authorization Type Medicare   SLP Start Time [id number]   SLP Stop Time  1345   SLP Time Calculation (min) 34 min   Activity Tolerance Patient tolerated treatment well      Past Medical History  Diagnosis Date  . Hypertension   . Hyperlipidemia   . BPH (benign prostatic hyperplasia)     Past Surgical History  Procedure Laterality Date  . Appendectomy      approx 30 years ago/lwb  . Umbilical hernia repair      Approx 10 years ago/lwb  . Ureteral stone extraction    . Cataract extraction w/phaco Left 09/11/2012    Procedure: CATARACT EXTRACTION PHACO AND INTRAOCULAR LENS PLACEMENT (IOC);  Surgeon: Tonny Branch, MD;  Location: [facility];  Service: Ophthalmology;  Laterality: Left;  CDE: 19.52  . Hip arthroplasty Right 12/06/2014    Procedure: PARTIAL RIGHT HIP REPLACEMENT;  Surgeon: Carole Civil, MD;  Location: [facility];  Service: Orthopedics;  Laterality: Right;    There were no vitals filed for this visit.  Visit Diagnosis: Dysphagia      Subjective Assessment - 12/22/14 1447    Subjective "I have this phlegm in my throat."   Special Tests MBSS   Currently in Pain? 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 At baseline he has hypertension mild chronic renal failure and BPH. He underwent a partial right hip replacement on 12/06/2014. His renal function has remained relatively stable. His blood pressure has been up and down somewhat so he's had modification  of his blood pressure medications. Dr. Luan Pulling referred him for MBSS due to pt c/o globus sensation and inability to clear phlegm. He is currently at [facility] for rehabilitation following his hip fracture and repair.   Type of Study MBS-Modified Barium Swallow Study   Previous Swallow Assessment None on record   Diet Prior to this Study Regular;Thin liquids   Temperature Spikes Noted No   Respiratory Status Room air   History of Recent Intubation No   Behavior/Cognition Alert;Cooperative;Pleasant mood   Oral Cavity Assessment Within Functional Limits   Oral Care Completed by SLP No   Oral Cavity - Dentition Adequate natural dentition   Vision Functional for self feeding   Self-Feeding Abilities Able to feed self   Patient Positioning Upright in chair   Baseline Vocal Quality Normal;Low vocal intensity   Volitional Cough Strong   Volitional Swallow Able to elicit   Anatomy Within functional limits   Pharyngeal Secretions Not observed secondary MBS            Oral Preparation/Oral Phase - 12/22/14 1457    Oral Preparation/Oral Phase   Oral Phase Within functional limits  Pt regurgitates vallecular residuals to oral cavity. see below   Electrical stimulation - Oral Phase   Was Electrical Stimulation Used No  Pharyngeal Phase - Jan 15, 2015 1458    Pharyngeal Phase   Pharyngeal Phase Impaired   Pharyngeal - Thin   Pharyngeal- Thin Cup Delayed swallow initiation;Swallow initiation at vallecula;Reduced epiglottic inversion;Reduced tongue base retraction;Pharyngeal residue - valleculae   Pharyngeal- Thin Straw Delayed swallow initiation;Swallow initiation at vallecula;Reduced epiglottic inversion;Reduced airway/laryngeal closure;Reduced tongue base retraction;Penetration/Aspiration during swallow;Pharyngeal residue - valleculae;Lateral channel residue   Pharyngeal Material does not enter airway;Material enters airway, remains ABOVE vocal cords then ejected out;Material enters  airway, remains ABOVE vocal cords and not ejected out   Pharyngeal - Solids   Pharyngeal- Puree Reduced tongue base retraction;Reduced epiglottic inversion;Pharyngeal residue - valleculae;Pharyngeal residue - pyriform;Pharyngeal residue - posterior pharnyx   Pharyngeal- Regular Reduced tongue base retraction;Reduced epiglottic inversion;Pharyngeal residue - valleculae   Pharyngeal- Pill Within functional limits   Electrical Stimulation - Pharyngeal Phase   Was Electrical Stimulation Used No          Cricopharyngeal Phase - Jan 15, 2015 1500    Cervical Esophageal Phase   Cervical Esophageal Phase Within functional limits           Plan - 01-15-2015 1501    Clinical Impression Statement Pt presents with mild pharyngeal phase dysphagia characterized by decreased tongue base retraction and epiglottic deflection with overall decreased pharyngeal pressure resulting in variable trace penetration of thins during the swallow with straw sips which did not completely clear with repeat swallow, but did with throat clear/cough and repeat swallow; variable mild to mild/mod vallecular residue after initial swallow which pt almost immediately clears by propelling back up to oral cavity and swallowing again. Pt with mild vallecular and pyriform/posterior pharyngeal wall just above UES which clears with liquid wash. Suspect residuals are primarily due to weakness as effortful swallow was helpful in reducing residual. The barium table was transiently delayed in distal esophagus near GE junction, but passed after a few seconds. Recommend regular textures and thin liquids with compensatory strategies: avoid use of straw or institute cough/throat clear and repeat swallow with use, effortful swallow, swallow 2x for each bite, alternate solids and liquids as needed. The study was reviewed with pt and treating SLP.           G-Codes - 01-15-15 1510    Functional Assessment Tool Used mbss; clinical judgment    Functional Limitations Swallowing   Swallow Current Status [id number]) At least 1 percent but less than 20 percent impaired, limited or restricted   Swallow Goal Status [id number]) At least 1 percent but less than 20 percent impaired, limited or restricted   Swallow Discharge Status [phone]) At least 1 percent but less than 20 percent impaired, limited or restricted          Recommendations/Treatment - 01-15-2015 1500    Swallow Evaluation Recommendations   SLP Diet Recommendations Age appropriate regular;Thin   Liquid Administration via Cup   Medication Administration Whole meds with liquid   Supervision Patient able to self feed   Compensations Multiple dry swallows after each bite/sip;Effortful swallow   Postural Changes Seated upright at 90 degrees;Remain upright for at least 30 minutes after feeds/meals          Prognosis - 15-Jan-2015 1500    Prognosis   Prognosis for Safe Diet Advancement Good   Individuals Consulted   Consulted and Agree with Results and Recommendations Patient   Report Sent to  Primary SLP;Facility (Comment);Referring physician      Problem List Patient Active Problem List   Diagnosis Date Noted  . Closed right hip fracture (  [place]) 12/05/2014  . Closed hip fracture ([place]) 12/05/2014  . Hypertension 12/05/2014  . BPH (benign prostatic hyperplasia) 12/05/2014  . Hyperlipidemia 12/05/2014  . Essential hypertension, benign 06/01/2010  . NUMBNESS/TINGLING 11/20/2006  . BACK PAIN 11/20/2006   Thank you,  Genene Churn, [place]  [facility] 12/22/2014, 3:11 PM  [facility] [address] [place], [place], [address] Phone: [phone]   Fax:  [phone]  Name: Dominic Hodges MRN: [id number] Date of Birth: [birth year]

## 2014-12-29 ENCOUNTER — Telehealth: Payer: Self-pay | Admitting: Orthopedic Surgery

## 2014-12-29 NOTE — Telephone Encounter (Signed)
Santiago Glad from [facility] has called to inquire about patient's post op care - status/post hip fracture/partial hip replacement surgery, 12/06/14; states that he will be discharged to home today, 12/29/14; however, states "he still has staples in"; also asking when he is to be scheduled for a follow-up/post op appointment? (Routing to nurse while Santiago Glad is still on line)

## 2014-12-29 NOTE — Telephone Encounter (Signed)
Spoke with Santiago Glad and advised staples to come out today, patient was given follow up appointment for 01/04/15. Dr Aline Brochure has been made aware.   Santiago Glad at [facility]- [phone]

## 2014-12-30 DIAGNOSIS — Z9181 History of falling: Secondary | ICD-10-CM | POA: Diagnosis not present

## 2014-12-30 DIAGNOSIS — N400 Enlarged prostate without lower urinary tract symptoms: Secondary | ICD-10-CM | POA: Diagnosis not present

## 2014-12-30 DIAGNOSIS — M6281 Muscle weakness (generalized): Secondary | ICD-10-CM | POA: Diagnosis not present

## 2014-12-30 DIAGNOSIS — I10 Essential (primary) hypertension: Secondary | ICD-10-CM | POA: Diagnosis not present

## 2014-12-30 DIAGNOSIS — E785 Hyperlipidemia, unspecified: Secondary | ICD-10-CM | POA: Diagnosis not present

## 2014-12-30 DIAGNOSIS — S72011D Unspecified intracapsular fracture of right femur, subsequent encounter for closed fracture with routine healing: Secondary | ICD-10-CM | POA: Diagnosis not present

## 2015-01-03 DIAGNOSIS — E785 Hyperlipidemia, unspecified: Secondary | ICD-10-CM | POA: Diagnosis not present

## 2015-01-03 DIAGNOSIS — M6281 Muscle weakness (generalized): Secondary | ICD-10-CM | POA: Diagnosis not present

## 2015-01-03 DIAGNOSIS — S72011D Unspecified intracapsular fracture of right femur, subsequent encounter for closed fracture with routine healing: Secondary | ICD-10-CM | POA: Diagnosis not present

## 2015-01-03 DIAGNOSIS — N400 Enlarged prostate without lower urinary tract symptoms: Secondary | ICD-10-CM | POA: Diagnosis not present

## 2015-01-03 DIAGNOSIS — Z9181 History of falling: Secondary | ICD-10-CM | POA: Diagnosis not present

## 2015-01-03 DIAGNOSIS — I10 Essential (primary) hypertension: Secondary | ICD-10-CM | POA: Diagnosis not present

## 2015-01-04 ENCOUNTER — Encounter: Payer: Self-pay | Admitting: Orthopedic Surgery

## 2015-01-04 ENCOUNTER — Ambulatory Visit (INDEPENDENT_AMBULATORY_CARE_PROVIDER_SITE_OTHER): Payer: Self-pay | Admitting: Orthopedic Surgery

## 2015-01-04 VITALS — BP 137/68 | Ht 73.0 in | Wt 166.7 lb

## 2015-01-04 DIAGNOSIS — S72001D Fracture of unspecified part of neck of right femur, subsequent encounter for closed fracture with routine healing: Secondary | ICD-10-CM

## 2015-01-04 NOTE — Progress Notes (Signed)
Chief Complaint  Patient presents with  . Follow-up    post op 1, RIGHT HIP PARTIAL REPLACEMENT,  DOS 12/06/14    Post right partial hip for right hip fracture  Patient is doing well postop day 29  Hip incision healed well  Patient thinks he has a slight limb length discrepancy which we will evaluate at the end of 12 weeks  Follow-up 4 weeks continue therapy

## 2015-01-05 ENCOUNTER — Telehealth: Payer: Self-pay | Admitting: *Deleted

## 2015-01-05 DIAGNOSIS — N400 Enlarged prostate without lower urinary tract symptoms: Secondary | ICD-10-CM | POA: Diagnosis not present

## 2015-01-05 DIAGNOSIS — E785 Hyperlipidemia, unspecified: Secondary | ICD-10-CM | POA: Diagnosis not present

## 2015-01-05 DIAGNOSIS — Z9181 History of falling: Secondary | ICD-10-CM | POA: Diagnosis not present

## 2015-01-05 DIAGNOSIS — M6281 Muscle weakness (generalized): Secondary | ICD-10-CM | POA: Diagnosis not present

## 2015-01-05 DIAGNOSIS — I10 Essential (primary) hypertension: Secondary | ICD-10-CM | POA: Diagnosis not present

## 2015-01-05 DIAGNOSIS — S72011D Unspecified intracapsular fracture of right femur, subsequent encounter for closed fracture with routine healing: Secondary | ICD-10-CM | POA: Diagnosis not present

## 2015-01-05 NOTE — Telephone Encounter (Signed)
Amy Lench from [facility] called and left a message regarding patient about physical therapy, Amy needs to know if the patient has no restrictions and no precautions. Please advise Amy [phone]

## 2015-01-05 NOTE — Telephone Encounter (Signed)
Routing to Dr Harrison 

## 2015-01-05 NOTE — Telephone Encounter (Signed)
Going forward  Partial and total hip replacements that i did   Direct lateral hip precautions = avoid deep squats, extension external rotation, avoid active hip abduction x 6 weeks

## 2015-01-06 DIAGNOSIS — E785 Hyperlipidemia, unspecified: Secondary | ICD-10-CM | POA: Diagnosis not present

## 2015-01-06 DIAGNOSIS — Z9181 History of falling: Secondary | ICD-10-CM | POA: Diagnosis not present

## 2015-01-06 DIAGNOSIS — S72011D Unspecified intracapsular fracture of right femur, subsequent encounter for closed fracture with routine healing: Secondary | ICD-10-CM | POA: Diagnosis not present

## 2015-01-06 DIAGNOSIS — N400 Enlarged prostate without lower urinary tract symptoms: Secondary | ICD-10-CM | POA: Diagnosis not present

## 2015-01-06 DIAGNOSIS — I10 Essential (primary) hypertension: Secondary | ICD-10-CM | POA: Diagnosis not present

## 2015-01-06 DIAGNOSIS — M6281 Muscle weakness (generalized): Secondary | ICD-10-CM | POA: Diagnosis not present

## 2015-01-06 NOTE — Telephone Encounter (Signed)
Returned call, no answer, no option to leave vm

## 2015-01-06 NOTE — Telephone Encounter (Signed)
Routing to [employer]

## 2015-01-07 DIAGNOSIS — I10 Essential (primary) hypertension: Secondary | ICD-10-CM | POA: Diagnosis not present

## 2015-01-07 DIAGNOSIS — E785 Hyperlipidemia, unspecified: Secondary | ICD-10-CM | POA: Diagnosis not present

## 2015-01-07 DIAGNOSIS — S72011D Unspecified intracapsular fracture of right femur, subsequent encounter for closed fracture with routine healing: Secondary | ICD-10-CM | POA: Diagnosis not present

## 2015-01-07 DIAGNOSIS — N400 Enlarged prostate without lower urinary tract symptoms: Secondary | ICD-10-CM | POA: Diagnosis not present

## 2015-01-07 DIAGNOSIS — Z9181 History of falling: Secondary | ICD-10-CM | POA: Diagnosis not present

## 2015-01-07 DIAGNOSIS — M6281 Muscle weakness (generalized): Secondary | ICD-10-CM | POA: Diagnosis not present

## 2015-01-10 DIAGNOSIS — I10 Essential (primary) hypertension: Secondary | ICD-10-CM | POA: Diagnosis not present

## 2015-01-10 DIAGNOSIS — N400 Enlarged prostate without lower urinary tract symptoms: Secondary | ICD-10-CM | POA: Diagnosis not present

## 2015-01-10 DIAGNOSIS — M6281 Muscle weakness (generalized): Secondary | ICD-10-CM | POA: Diagnosis not present

## 2015-01-10 DIAGNOSIS — E785 Hyperlipidemia, unspecified: Secondary | ICD-10-CM | POA: Diagnosis not present

## 2015-01-10 DIAGNOSIS — Z9181 History of falling: Secondary | ICD-10-CM | POA: Diagnosis not present

## 2015-01-10 DIAGNOSIS — S72011D Unspecified intracapsular fracture of right femur, subsequent encounter for closed fracture with routine healing: Secondary | ICD-10-CM | POA: Diagnosis not present

## 2015-01-11 DIAGNOSIS — Z9181 History of falling: Secondary | ICD-10-CM | POA: Diagnosis not present

## 2015-01-11 DIAGNOSIS — S72011D Unspecified intracapsular fracture of right femur, subsequent encounter for closed fracture with routine healing: Secondary | ICD-10-CM | POA: Diagnosis not present

## 2015-01-11 DIAGNOSIS — I10 Essential (primary) hypertension: Secondary | ICD-10-CM | POA: Diagnosis not present

## 2015-01-11 DIAGNOSIS — M6281 Muscle weakness (generalized): Secondary | ICD-10-CM | POA: Diagnosis not present

## 2015-01-11 DIAGNOSIS — N400 Enlarged prostate without lower urinary tract symptoms: Secondary | ICD-10-CM | POA: Diagnosis not present

## 2015-01-11 DIAGNOSIS — E785 Hyperlipidemia, unspecified: Secondary | ICD-10-CM | POA: Diagnosis not present

## 2015-01-12 DIAGNOSIS — M6281 Muscle weakness (generalized): Secondary | ICD-10-CM | POA: Diagnosis not present

## 2015-01-12 DIAGNOSIS — E785 Hyperlipidemia, unspecified: Secondary | ICD-10-CM | POA: Diagnosis not present

## 2015-01-12 DIAGNOSIS — N400 Enlarged prostate without lower urinary tract symptoms: Secondary | ICD-10-CM | POA: Diagnosis not present

## 2015-01-12 DIAGNOSIS — Z9181 History of falling: Secondary | ICD-10-CM | POA: Diagnosis not present

## 2015-01-12 DIAGNOSIS — I10 Essential (primary) hypertension: Secondary | ICD-10-CM | POA: Diagnosis not present

## 2015-01-12 DIAGNOSIS — S72011D Unspecified intracapsular fracture of right femur, subsequent encounter for closed fracture with routine healing: Secondary | ICD-10-CM | POA: Diagnosis not present

## 2015-01-13 DIAGNOSIS — N401 Enlarged prostate with lower urinary tract symptoms: Secondary | ICD-10-CM | POA: Diagnosis not present

## 2015-01-13 DIAGNOSIS — S72001D Fracture of unspecified part of neck of right femur, subsequent encounter for closed fracture with routine healing: Secondary | ICD-10-CM | POA: Diagnosis not present

## 2015-01-13 DIAGNOSIS — I10 Essential (primary) hypertension: Secondary | ICD-10-CM | POA: Diagnosis not present

## 2015-01-13 DIAGNOSIS — K210 Gastro-esophageal reflux disease with esophagitis: Secondary | ICD-10-CM | POA: Diagnosis not present

## 2015-01-14 DIAGNOSIS — N400 Enlarged prostate without lower urinary tract symptoms: Secondary | ICD-10-CM | POA: Diagnosis not present

## 2015-01-14 DIAGNOSIS — S72011D Unspecified intracapsular fracture of right femur, subsequent encounter for closed fracture with routine healing: Secondary | ICD-10-CM | POA: Diagnosis not present

## 2015-01-14 DIAGNOSIS — Z9181 History of falling: Secondary | ICD-10-CM | POA: Diagnosis not present

## 2015-01-14 DIAGNOSIS — E785 Hyperlipidemia, unspecified: Secondary | ICD-10-CM | POA: Diagnosis not present

## 2015-01-14 DIAGNOSIS — I10 Essential (primary) hypertension: Secondary | ICD-10-CM | POA: Diagnosis not present

## 2015-01-14 DIAGNOSIS — M6281 Muscle weakness (generalized): Secondary | ICD-10-CM | POA: Diagnosis not present

## 2015-01-17 DIAGNOSIS — N400 Enlarged prostate without lower urinary tract symptoms: Secondary | ICD-10-CM | POA: Diagnosis not present

## 2015-01-17 DIAGNOSIS — Z9181 History of falling: Secondary | ICD-10-CM | POA: Diagnosis not present

## 2015-01-17 DIAGNOSIS — S72011D Unspecified intracapsular fracture of right femur, subsequent encounter for closed fracture with routine healing: Secondary | ICD-10-CM | POA: Diagnosis not present

## 2015-01-17 DIAGNOSIS — I10 Essential (primary) hypertension: Secondary | ICD-10-CM | POA: Diagnosis not present

## 2015-01-17 DIAGNOSIS — M6281 Muscle weakness (generalized): Secondary | ICD-10-CM | POA: Diagnosis not present

## 2015-01-17 DIAGNOSIS — E785 Hyperlipidemia, unspecified: Secondary | ICD-10-CM | POA: Diagnosis not present

## 2015-01-19 DIAGNOSIS — Z9181 History of falling: Secondary | ICD-10-CM | POA: Diagnosis not present

## 2015-01-19 DIAGNOSIS — I10 Essential (primary) hypertension: Secondary | ICD-10-CM | POA: Diagnosis not present

## 2015-01-19 DIAGNOSIS — N400 Enlarged prostate without lower urinary tract symptoms: Secondary | ICD-10-CM | POA: Diagnosis not present

## 2015-01-19 DIAGNOSIS — M6281 Muscle weakness (generalized): Secondary | ICD-10-CM | POA: Diagnosis not present

## 2015-01-19 DIAGNOSIS — E785 Hyperlipidemia, unspecified: Secondary | ICD-10-CM | POA: Diagnosis not present

## 2015-01-19 DIAGNOSIS — S72011D Unspecified intracapsular fracture of right femur, subsequent encounter for closed fracture with routine healing: Secondary | ICD-10-CM | POA: Diagnosis not present

## 2015-01-25 DIAGNOSIS — S72011D Unspecified intracapsular fracture of right femur, subsequent encounter for closed fracture with routine healing: Secondary | ICD-10-CM | POA: Diagnosis not present

## 2015-01-25 DIAGNOSIS — N400 Enlarged prostate without lower urinary tract symptoms: Secondary | ICD-10-CM | POA: Diagnosis not present

## 2015-01-25 DIAGNOSIS — I10 Essential (primary) hypertension: Secondary | ICD-10-CM | POA: Diagnosis not present

## 2015-01-25 DIAGNOSIS — M6281 Muscle weakness (generalized): Secondary | ICD-10-CM | POA: Diagnosis not present

## 2015-01-25 DIAGNOSIS — Z9181 History of falling: Secondary | ICD-10-CM | POA: Diagnosis not present

## 2015-01-25 DIAGNOSIS — E785 Hyperlipidemia, unspecified: Secondary | ICD-10-CM | POA: Diagnosis not present

## 2015-01-27 DIAGNOSIS — E785 Hyperlipidemia, unspecified: Secondary | ICD-10-CM | POA: Diagnosis not present

## 2015-01-27 DIAGNOSIS — S72011D Unspecified intracapsular fracture of right femur, subsequent encounter for closed fracture with routine healing: Secondary | ICD-10-CM | POA: Diagnosis not present

## 2015-01-27 DIAGNOSIS — M6281 Muscle weakness (generalized): Secondary | ICD-10-CM | POA: Diagnosis not present

## 2015-01-27 DIAGNOSIS — Z9181 History of falling: Secondary | ICD-10-CM | POA: Diagnosis not present

## 2015-01-27 DIAGNOSIS — I10 Essential (primary) hypertension: Secondary | ICD-10-CM | POA: Diagnosis not present

## 2015-01-27 DIAGNOSIS — N400 Enlarged prostate without lower urinary tract symptoms: Secondary | ICD-10-CM | POA: Diagnosis not present

## 2015-02-01 ENCOUNTER — Ambulatory Visit: Payer: Medicare Other

## 2015-02-01 ENCOUNTER — Ambulatory Visit (INDEPENDENT_AMBULATORY_CARE_PROVIDER_SITE_OTHER): Payer: Medicare Other

## 2015-02-01 ENCOUNTER — Ambulatory Visit (INDEPENDENT_AMBULATORY_CARE_PROVIDER_SITE_OTHER): Payer: Medicare Other | Admitting: Orthopedic Surgery

## 2015-02-01 VITALS — BP 144/66 | Ht 73.0 in | Wt 166.7 lb

## 2015-02-01 DIAGNOSIS — M25552 Pain in left hip: Secondary | ICD-10-CM

## 2015-02-01 DIAGNOSIS — S7002XA Contusion of left hip, initial encounter: Secondary | ICD-10-CM | POA: Diagnosis not present

## 2015-02-01 MED ORDER — HYDROCODONE-ACETAMINOPHEN 5-325 MG PO TABS: ORAL_TABLET | ORAL | Status: DC

## 2015-02-01 NOTE — Progress Notes (Signed)
Patient ID: Dominic Hodges, male   DOB: [birth year], 82 y.o.   MRN: [id number]  Chief Complaint  Patient presents with  . Follow-up    post op 2, Rt partial hip replacement, DOS 12/06/14    BP 144/66 mmHg  Ht 6\' 1"  (1.854 m)  Wt 166 lb 11.2 oz (75.615 kg)  BMI 22.00 kg/m2  2 months postop right hip replacement for fracture bipolar replacement  Doing well  He does have some mild leg length discrepancy treated with insert in his right heel  [place] complains of left hip pain  X-ray today shows no fracture  Impression hip contusion    ASSESSMENT AND PLAN   Left hip contusion  Status post bipolar right hip replacement  He took ibuprofen didn't get pain relief take a half of 5 mg Vicodin got good relief so we'll go with that  Follow-up in a month

## 2015-02-03 DIAGNOSIS — Z9181 History of falling: Secondary | ICD-10-CM | POA: Diagnosis not present

## 2015-02-03 DIAGNOSIS — S72011D Unspecified intracapsular fracture of right femur, subsequent encounter for closed fracture with routine healing: Secondary | ICD-10-CM | POA: Diagnosis not present

## 2015-02-03 DIAGNOSIS — M6281 Muscle weakness (generalized): Secondary | ICD-10-CM | POA: Diagnosis not present

## 2015-02-03 DIAGNOSIS — E785 Hyperlipidemia, unspecified: Secondary | ICD-10-CM | POA: Diagnosis not present

## 2015-02-03 DIAGNOSIS — I10 Essential (primary) hypertension: Secondary | ICD-10-CM | POA: Diagnosis not present

## 2015-02-03 DIAGNOSIS — N400 Enlarged prostate without lower urinary tract symptoms: Secondary | ICD-10-CM | POA: Diagnosis not present

## 2015-02-14 DIAGNOSIS — M545 Low back pain: Secondary | ICD-10-CM | POA: Diagnosis not present

## 2015-02-14 DIAGNOSIS — I129 Hypertensive chronic kidney disease with stage 1 through stage 4 chronic kidney disease, or unspecified chronic kidney disease: Secondary | ICD-10-CM | POA: Diagnosis not present

## 2015-02-14 DIAGNOSIS — I10 Essential (primary) hypertension: Secondary | ICD-10-CM | POA: Diagnosis not present

## 2015-02-14 DIAGNOSIS — N401 Enlarged prostate with lower urinary tract symptoms: Secondary | ICD-10-CM | POA: Diagnosis not present

## 2015-02-24 ENCOUNTER — Other Ambulatory Visit: Payer: Self-pay | Admitting: *Deleted

## 2015-02-24 MED ORDER — HYDROCODONE-ACETAMINOPHEN 5-325 MG PO TABS: ORAL_TABLET | ORAL | Status: DC

## 2015-03-04 ENCOUNTER — Ambulatory Visit (INDEPENDENT_AMBULATORY_CARE_PROVIDER_SITE_OTHER): Payer: Self-pay | Admitting: Orthopedic Surgery

## 2015-03-04 ENCOUNTER — Encounter: Payer: Self-pay | Admitting: Orthopedic Surgery

## 2015-03-04 VITALS — BP 135/66 | Ht 73.0 in | Wt 161.0 lb

## 2015-03-04 DIAGNOSIS — S72001D Fracture of unspecified part of neck of right femur, subsequent encounter for closed fracture with routine healing: Secondary | ICD-10-CM

## 2015-03-04 DIAGNOSIS — Z9660 Presence of unspecified orthopedic joint implant: Secondary | ICD-10-CM

## 2015-03-04 DIAGNOSIS — Z96641 Presence of right artificial hip joint: Secondary | ICD-10-CM

## 2015-03-04 DIAGNOSIS — S7002XD Contusion of left hip, subsequent encounter: Secondary | ICD-10-CM

## 2015-03-04 MED ORDER — PREGABALIN 50 MG PO CAPS: 50.0000 mg | ORAL_CAPSULE | Freq: Three times a day (TID) | ORAL | Status: DC | PRN

## 2015-03-04 NOTE — Progress Notes (Signed)
Patient ID: Dominic Hodges, male   DOB: 07/20/1927, 82 y.o.   MRN: [id number]  Chief Complaint  Patient presents with  . Follow-up    FOLLOW UP RT PARTIAL HIP, DOS 12/06/14   He is still in the global period for a partial right hip replacement done for right hip femoral neck fracture. His hip does not bother him at all BP 135/66 mmHg  Ht 6\' 1"  (1.854 m)  Wt 161 lb (73.029 kg)  BMI 21.25 kg/m2  He did fall about 4 weeks ago and sustained a left hip contusion R x-rays here were negative. He says he can walk a certain distance then he starts to have pain in the left ischial area. He says this is getting better. He says that time he can walk on the leg is getting much better. His pain is relieved by getting off his feet. He still using a cane. He has no radicular symptoms    ASSESSMENT AND PLAN   Contusion left hip status post right bipolar for hip fracture  Continues to do well. Contusion seems to be bothering him the most. He would like to try some Lyrica he takes 1-1-1/2 tablets of Vicodin per day and that seems to control the pain along with the rest  Follow-up one month

## 2015-03-24 DIAGNOSIS — N400 Enlarged prostate without lower urinary tract symptoms: Secondary | ICD-10-CM | POA: Diagnosis not present

## 2015-03-24 DIAGNOSIS — E785 Hyperlipidemia, unspecified: Secondary | ICD-10-CM | POA: Diagnosis not present

## 2015-03-24 DIAGNOSIS — E559 Vitamin D deficiency, unspecified: Secondary | ICD-10-CM | POA: Diagnosis not present

## 2015-03-24 DIAGNOSIS — R5383 Other fatigue: Secondary | ICD-10-CM | POA: Diagnosis not present

## 2015-03-24 DIAGNOSIS — I10 Essential (primary) hypertension: Secondary | ICD-10-CM | POA: Diagnosis not present

## 2015-03-24 DIAGNOSIS — M791 Myalgia: Secondary | ICD-10-CM | POA: Diagnosis not present

## 2015-03-25 ENCOUNTER — Other Ambulatory Visit (HOSPITAL_COMMUNITY): Payer: Self-pay | Admitting: Internal Medicine

## 2015-03-25 DIAGNOSIS — R52 Pain, unspecified: Secondary | ICD-10-CM

## 2015-03-30 ENCOUNTER — Ambulatory Visit (HOSPITAL_COMMUNITY)
Admission: RE | Admit: 2015-03-30 | Discharge: 2015-03-30 | Disposition: A | Discharge status: Home / Self Care | Payer: Medicare Other | Source: Ambulatory Visit | Attending: Internal Medicine | Admitting: Internal Medicine

## 2015-03-30 DIAGNOSIS — I700 Atherosclerosis of aorta: Secondary | ICD-10-CM | POA: Diagnosis not present

## 2015-03-30 DIAGNOSIS — R102 Pelvic and perineal pain: Secondary | ICD-10-CM | POA: Diagnosis not present

## 2015-03-30 DIAGNOSIS — R52 Pain, unspecified: Secondary | ICD-10-CM

## 2015-03-30 DIAGNOSIS — M5126 Other intervertebral disc displacement, lumbar region: Secondary | ICD-10-CM | POA: Insufficient documentation

## 2015-03-30 DIAGNOSIS — M4806 Spinal stenosis, lumbar region: Secondary | ICD-10-CM | POA: Diagnosis not present

## 2015-03-30 DIAGNOSIS — M25552 Pain in left hip: Secondary | ICD-10-CM | POA: Diagnosis not present

## 2015-04-01 ENCOUNTER — Ambulatory Visit (INDEPENDENT_AMBULATORY_CARE_PROVIDER_SITE_OTHER): Payer: Medicare Other | Admitting: Orthopedic Surgery

## 2015-04-01 VITALS — BP 145/79 | Wt 166.0 lb

## 2015-04-01 DIAGNOSIS — M48061 Spinal stenosis, lumbar region without neurogenic claudication: Secondary | ICD-10-CM

## 2015-04-01 DIAGNOSIS — M4806 Spinal stenosis, lumbar region: Secondary | ICD-10-CM

## 2015-04-01 DIAGNOSIS — S72001D Fracture of unspecified part of neck of right femur, subsequent encounter for closed fracture with routine healing: Secondary | ICD-10-CM | POA: Diagnosis not present

## 2015-04-01 MED ORDER — PREDNISONE 10 MG (48) PO TBPK: ORAL_TABLET | ORAL | Status: DC

## 2015-04-01 NOTE — Progress Notes (Signed)
Patient ID: Dominic Hodges, male   DOB: 1927/10/27, 82 y.o.   MRN: [id number]  Chief Complaint  Patient presents with  . Follow-up    1 month recheck on both hips, DOS 12-06-14.    HPI 4 months after right hip fracture treated with bipolar replacement. In the postop period the patient fell landed on his right hip and started having left hip pain x-ray was negative. He was put on gabapentin I believe and then tolerated tried some Lyrica didn't tolerate that. He has been on hydrocodone half a tablet in the evening with good relief of pain. It seems that his pain is now localized to his left lower back and gluteal area. He had a CT scan and that showed he has lumbar disc disease facet arthritis and foraminal stenosis. Pain is nonradiating  Review of Systems  Constitutional: Negative for fever and chills.  Musculoskeletal: Positive for back pain.  Neurological: Negative for tingling and sensory change.    BP 145/79 mmHg  Wt 166 lb (75.297 kg)  Physical Exam  Constitutional: He is oriented to person, place, and time. He appears well-developed and well-nourished. No distress.  Cardiovascular: Normal rate and intact distal pulses.   Musculoskeletal:  Intermittent use of a cane slight perception of right leg shorter does not bear out clinically but he notices it to some degree  Neurological: He is alert and oriented to person, place, and time.  Skin: Skin is warm and dry. No rash noted. He is not diaphoretic. No erythema. No pallor.  Psychiatric: He has a normal mood and affect. His behavior is normal. Judgment and thought content normal.    Ortho Exam  Lumbar spine exam no midline tenderness but he does have SI joint tenderness and left gluteal tenderness with normal strength in his left leg  ASSESSMENT AND PLAN   CT scan report as follows   FINDINGS: No acute fracture. No dislocation. Osteopenia. Right total hip arthroplasty is anatomically aligned. Advanced disc space  narrowing at L3-4 and L4-5. There is advanced facet arthropathy on the left at L4-5 and L5-S1. There is sclerosis in the right sacral ala adjacent to the SI joint which may re- related to an old healed stress fracture. Degenerative disc disease results in significant spinal stenosis at L3-4 and L4-5. Bilateral lateral recess narrowing occurs at L5-S1. There is anterolisthesis at L5-S1 which is facet BD.   Atherosclerotic calcifications of the aorta and iliac vasculature. There is a partially image cystic lesion in the left pericolic gutter with peripheral calcifications which may be related to the kidney.   IMPRESSION: No acute bony injury per   Advanced degenerative changes.   Partially imaged complex cystic lesion in the left hemi abdomen as described above.     Electronically Signed   By: Marybelle Killings M.D.   On: 03/30/2015 16:18   If seems more consistent with his history as we reviewed it he didn't land on his left side so bone contusion probably not the cause most likely exacerbation of underlying spinal stenosis and degenerative disc disease  Recommend prednisone Dosepak  Follow-up 2-3 weeks if no improvement recommend epidural steroid injection at L5-S1 or L4-L5

## 2015-04-04 ENCOUNTER — Encounter (HOSPITAL_COMMUNITY): Payer: Self-pay

## 2015-04-06 DIAGNOSIS — E559 Vitamin D deficiency, unspecified: Secondary | ICD-10-CM | POA: Diagnosis not present

## 2015-04-06 DIAGNOSIS — R739 Hyperglycemia, unspecified: Secondary | ICD-10-CM | POA: Diagnosis not present

## 2015-04-06 DIAGNOSIS — N400 Enlarged prostate without lower urinary tract symptoms: Secondary | ICD-10-CM | POA: Diagnosis not present

## 2015-04-06 DIAGNOSIS — M791 Myalgia: Secondary | ICD-10-CM | POA: Diagnosis not present

## 2015-04-14 DIAGNOSIS — N401 Enlarged prostate with lower urinary tract symptoms: Secondary | ICD-10-CM | POA: Diagnosis not present

## 2015-04-14 DIAGNOSIS — I10 Essential (primary) hypertension: Secondary | ICD-10-CM | POA: Diagnosis not present

## 2015-04-14 DIAGNOSIS — E785 Hyperlipidemia, unspecified: Secondary | ICD-10-CM | POA: Diagnosis not present

## 2015-04-14 DIAGNOSIS — S72001D Fracture of unspecified part of neck of right femur, subsequent encounter for closed fracture with routine healing: Secondary | ICD-10-CM | POA: Diagnosis not present

## 2015-04-15 ENCOUNTER — Other Ambulatory Visit: Payer: Self-pay | Admitting: Pulmonary Disease

## 2015-04-15 DIAGNOSIS — G8929 Other chronic pain: Secondary | ICD-10-CM

## 2015-04-15 DIAGNOSIS — M545 Low back pain: Principal | ICD-10-CM

## 2015-04-18 DIAGNOSIS — T1490 Injury, unspecified: Secondary | ICD-10-CM | POA: Diagnosis not present

## 2015-04-19 ENCOUNTER — Ambulatory Visit (INDEPENDENT_AMBULATORY_CARE_PROVIDER_SITE_OTHER): Payer: Medicare Other | Admitting: Urology

## 2015-04-19 ENCOUNTER — Telehealth: Payer: Self-pay | Admitting: Orthopedic Surgery

## 2015-04-19 DIAGNOSIS — N401 Enlarged prostate with lower urinary tract symptoms: Secondary | ICD-10-CM

## 2015-04-19 DIAGNOSIS — R972 Elevated prostate specific antigen [PSA]: Secondary | ICD-10-CM | POA: Diagnosis not present

## 2015-04-19 DIAGNOSIS — N3281 Overactive bladder: Secondary | ICD-10-CM | POA: Diagnosis not present

## 2015-04-19 DIAGNOSIS — N281 Cyst of kidney, acquired: Secondary | ICD-10-CM

## 2015-04-19 MED ORDER — HYDROCODONE-ACETAMINOPHEN 5-325 MG PO TABS: ORAL_TABLET | ORAL | Status: DC

## 2015-04-19 NOTE — Telephone Encounter (Signed)
Patient called to request refill on medication: HYDROcodone-acetaminophen (NORCO/VICODIN) 5-325 MG tablet [id number] - aware that Dr Aline Brochure is out of office this week, and that refill requests are being routed to Dr. Luna Glasgow.

## 2015-04-19 NOTE — Telephone Encounter (Signed)
Rx done. 

## 2015-04-20 ENCOUNTER — Ambulatory Visit (HOSPITAL_COMMUNITY): Payer: Medicare Other | Admitting: Speech Pathology

## 2015-04-25 ENCOUNTER — Other Ambulatory Visit: Payer: Self-pay | Admitting: Pulmonary Disease

## 2015-04-25 ENCOUNTER — Ambulatory Visit: Payer: Medicare Other | Admitting: Orthopedic Surgery

## 2015-04-25 ENCOUNTER — Ambulatory Visit
Admission: RE | Admit: 2015-04-25 | Discharge: 2015-04-25 | Disposition: A | Discharge status: Home / Self Care | Payer: Medicare Other | Source: Ambulatory Visit | Attending: Pulmonary Disease | Admitting: Pulmonary Disease

## 2015-04-25 DIAGNOSIS — M5137 Other intervertebral disc degeneration, lumbosacral region: Secondary | ICD-10-CM | POA: Diagnosis not present

## 2015-04-25 DIAGNOSIS — M545 Low back pain: Principal | ICD-10-CM

## 2015-04-25 DIAGNOSIS — G8929 Other chronic pain: Secondary | ICD-10-CM

## 2015-04-25 MED ORDER — IOHEXOL 180 MG/ML  SOLN
1.0000 mL | Freq: Once | INTRAMUSCULAR | Status: AC | PRN
  Administered 2015-04-25: 1 mL via EPIDURAL

## 2015-04-25 MED ORDER — METHYLPREDNISOLONE ACETATE 40 MG/ML INJ SUSP (RADIOLOG
120.0000 mg | Freq: Once | INTRAMUSCULAR | Status: AC
  Administered 2015-04-25: 120 mg via EPIDURAL

## 2015-04-25 NOTE — Discharge Instructions (Signed)

## 2015-04-27 ENCOUNTER — Encounter: Payer: Self-pay | Admitting: Orthopedic Surgery

## 2015-04-27 ENCOUNTER — Ambulatory Visit (INDEPENDENT_AMBULATORY_CARE_PROVIDER_SITE_OTHER): Payer: Medicare Other | Admitting: Orthopedic Surgery

## 2015-04-27 VITALS — BP 158/84 | Ht 73.0 in | Wt 156.0 lb

## 2015-04-27 DIAGNOSIS — M48061 Spinal stenosis, lumbar region without neurogenic claudication: Secondary | ICD-10-CM

## 2015-04-27 DIAGNOSIS — M4806 Spinal stenosis, lumbar region: Secondary | ICD-10-CM | POA: Diagnosis not present

## 2015-04-27 DIAGNOSIS — S72001D Fracture of unspecified part of neck of right femur, subsequent encounter for closed fracture with routine healing: Secondary | ICD-10-CM | POA: Diagnosis not present

## 2015-04-27 NOTE — Progress Notes (Signed)
Chief Complaint  Patient presents with  . Follow-up    FOLLOW UP BILATERAL HIPS, RT DOS 12-06-14.   Lumbar spine disease as well S/p esi with full pain relief   Ros fell again no pain  No radicular symptoms   BP 158/84 mmHg  Ht 6\' 1"  (1.854 m)  Wt 156 lb (70.761 kg)  BMI 20.59 kg/m2 He walks with a cane  Physical Exam  Constitutional: He is oriented to person, place, and time. He appears well-developed and well-nourished. No distress.  Cardiovascular: Normal rate.   Neurological: He is alert and oriented to person, place, and time.  Skin: Skin is warm and dry. No rash noted. He is not diaphoretic. No erythema. No pallor.  Psychiatric: He has a normal mood and affect. His behavior is normal. Judgment and thought content normal.   Lumbar diseases improved with esi   Hip fracture stable

## 2015-04-28 DIAGNOSIS — X32XXXD Exposure to sunlight, subsequent encounter: Secondary | ICD-10-CM | POA: Diagnosis not present

## 2015-04-28 DIAGNOSIS — L570 Actinic keratosis: Secondary | ICD-10-CM | POA: Diagnosis not present

## 2015-04-28 DIAGNOSIS — C44529 Squamous cell carcinoma of skin of other part of trunk: Secondary | ICD-10-CM | POA: Diagnosis not present

## 2015-05-03 ENCOUNTER — Encounter (HOSPITAL_COMMUNITY): Payer: Self-pay | Admitting: Speech Pathology

## 2015-05-03 ENCOUNTER — Ambulatory Visit (HOSPITAL_COMMUNITY): Payer: Medicare Other | Attending: Pulmonary Disease | Admitting: Speech Pathology

## 2015-05-03 DIAGNOSIS — R1312 Dysphagia, oropharyngeal phase: Secondary | ICD-10-CM | POA: Diagnosis not present

## 2015-05-03 NOTE — Therapy (Signed)
[facility] [place], [place], [address] Phone: [phone]   Fax:  [phone]  Clinical Swallow Evaluation  Patient Details  Name: Dominic Hodges MRN: [id number] Date of Birth: April 29, 1927 No Data Recorded  Encounter Date: 05/03/2015      End of Session - 05/03/15 1820    Visit Number 1   Number of Visits 3   Authorization Type Medicare   SLP Start Time [id number]   SLP Stop Time  [id number]   SLP Time Calculation (min) 57 min   Activity Tolerance Patient tolerated treatment well      Past Medical History  Diagnosis Date  . Hypertension   . Hyperlipidemia   . BPH (benign prostatic hyperplasia)     Past Surgical History  Procedure Laterality Date  . Appendectomy      approx 30 years ago/lwb  . Umbilical hernia repair      Approx 10 years ago/lwb  . Ureteral stone extraction    . Cataract extraction w/phaco Left 09/11/2012    Procedure: CATARACT EXTRACTION PHACO AND INTRAOCULAR LENS PLACEMENT (IOC);  Surgeon: Tonny Branch, MD;  Location: [facility];  Service: Ophthalmology;  Laterality: Left;  CDE: 19.52  . Hip arthroplasty Right 12/06/2014    Procedure: PARTIAL RIGHT HIP REPLACEMENT;  Surgeon: Carole Civil, MD;  Location: [facility];  Service: Orthopedics;  Laterality: Right;    There were no vitals filed for this visit.      Subjective Assessment - 05/03/15 1811    Subjective "I have this thick phlegm that is hard to get rid of."   Patient is accompained by: Family member   Currently in Pain? No/denies              Subjective Assessment - 05/03/15 1813    Symptoms/Limitations   Subjective "I have this thick phlegm that is hard to get rid of."   Patient is accompained by: Family member   Pain Assessment   Currently in Pain? 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 Luan Pulling for a clinical swallow evaluation due to reports of dysphagia, excess phlegm, and intermittent hoarse vocal quality since his surgery for hip fracture after a fall in November 2016. He had MBSS in December which showed flash penetration and NO aspiration of thin liquids; mild pharyngeal residuals which cleared with repeat swallow and liquid wash.    Type of Study Bedside Swallow Evaluation   Previous Swallow Assessment MBSS 12/22/2014: flash penetration of thins; mild pharyngeal residue; rec for regular textures and thin liquids with strategies.   Diet Prior to this Study Regular;Thin liquids   Temperature Spikes Noted No   Respiratory Status Room air   History of Recent Intubation No   Behavior/Cognition Alert;Cooperative;Pleasant mood   Oral Cavity Assessment Dried secretions  coated tongue, food remnants in lower teeth   Oral Care Completed by SLP No   Oral Cavity - Dentition Adequate natural dentition   Vision Functional for self-feeding   Self-Feeding  Abilities Able to feed self   Patient Positioning Upright in chair   Baseline Vocal Quality Normal;Hoarse  mild hoarseness   Volitional Cough Strong   Volitional Swallow Able to elicit          Oral Motor/Sensory Function - 05/03/15 1818    Oral Motor/Sensory Function   Overall Oral Motor/Sensory Function Within functional limits         Ice Chips - 05/03/15 1819    Ice Chips   Ice chips Not tested         Thin Liquid - 05/03/15 1819    Thin Liquid   Thin Liquid Within functional limits   Presentation Cup;Self Fed         Nectar thick liquid - 05/03/15 1819    Nectar Thick Liquid   Nectar Thick Liquid Not tested         Honey Thick Liquid - 05/03/15 1819    Honey Thick  Liquid   Honey Thick Liquid Not tested         Puree - 05/03/15 1819    Puree   Puree Within functional limits   Presentation Spoon;Self Fed         Solid - 05/03/15 1819    Solid   Solid Within functional limits   Presentation Self Fed             SLP Education - 05/03/15 1812    Education provided Yes   Education Details Recommendations regarding reflux precautions, swallow strengthening, and possible ENT consult   Person(s) Educated Patient;Spouse   Methods Explanation;Handout;Verbal cues   Comprehension Verbalized understanding          SLP Short Term Goals - 05/03/15 1823    SLP SHORT TERM GOAL #1   Title Pt will demonstrate safe and efficient consumption of self regulated regular textures and thin liquids with use of strategies as needed.   Baseline Pt currently on regular textures and reports difficulty with nuts, seeds, and cornbread   Time 1   Period Months   Status New   SLP SHORT TERM GOAL #2   Title Pt will complete pharyngeal strengthening exercises as assigned 3x per day with use of written cues.   Baseline not completing   Time 1   Period Months   Status New   SLP SHORT TERM GOAL #3   Title Pt will implement vocal hygiene techniques with min cues (avoid throat clearing, raise HOB, hard swallow, etc).   Baseline not implementing   Time 1   Period Months   Status New          SLP Long Term Goals - 05/03/15 1829    SLP LONG TERM GOAL #1   Title Same as short term          Plan - 05/03/15 1820    Clinical Impression Statement Pt with coated tongue and some food particles in lower teeth, othewise oral motor evaluation is unremarkable. Pt tolerated liquids and solids without incident and no overt signs/symptoms of aspiration. Throughout the session, pt cleared his throat frequently which he reported he needed to do to improve his vocal quality. Pt describes globus sensation when eating cornbread, seeds, and nuts. MBSS completed in  December, was reviewed with pt and wife. Pt had flash penetration, but no aspiration during that test. Recommend short term dysphagia therapy for pharyngeal strengthening (will train for home program), implementation of reflux precautions, and recommend ENT consult to visually evaluate larynx (pt and wife  worry about growths).   Speech Therapy Frequency 1x /week   Duration 2 weeks  2 visits over the next month   Treatment/Interventions Aspiration precaution training;Pharyngeal strengthening exercises;Diet toleration management by SLP;Compensatory techniques;SLP instruction and feedback;Patient/family education;Compensatory strategies   Potential to Achieve Goals Good   SLP Home Exercise Plan Pt will complete HEP as assigned to facilitate carryover of treatment strategies and techniques in home environment   Consulted and Agree with Plan of Care Patient      Patient will benefit from skilled therapeutic intervention in order to improve the following deficits and impairments:   Dysphagia, oropharyngeal phase      G-Codes - 05-07-2015 1830    Functional Assessment Tool Used clinical judgment   Functional Limitations Swallowing   Swallow Current Status [id number]) At least 1 percent but less than 20 percent impaired, limited or restricted   Swallow Goal Status [id number]) 0 percent impaired, limited or restricted      Problem List Patient Active Problem List   Diagnosis Date Noted  . Closed right hip fracture ([place]) 12/05/2014  . Closed hip fracture ([place]) 12/05/2014  . Hypertension 12/05/2014  . BPH (benign prostatic hyperplasia) 12/05/2014  . Hyperlipidemia 12/05/2014  . Essential hypertension, benign 06/01/2010  . NUMBNESS/TINGLING 11/20/2006  . BACK PAIN 11/20/2006   Thank you,  Genene Churn, [place]   [facility] 05/07/2015, 6:31 PM  [facility] [address] [place], [place], [address] Phone: [phone]   Fax:   [phone]  Name: Dominic Hodges MRN: [id number] Date of Birth: August 27, 1927

## 2015-05-04 ENCOUNTER — Ambulatory Visit: Payer: Medicare Other | Admitting: Orthopedic Surgery

## 2015-05-11 ENCOUNTER — Ambulatory Visit (HOSPITAL_COMMUNITY): Payer: Medicare Other | Admitting: Speech Pathology

## 2015-05-11 ENCOUNTER — Telehealth (HOSPITAL_COMMUNITY): Payer: Self-pay | Admitting: Speech Pathology

## 2015-05-11 NOTE — Telephone Encounter (Signed)
Speech Pathology  Telephone call placed to [name] due to missed appointment this date. I spoke with his wife who apologized as they thought the appointment was at 3 PM this afternoon. Unfortunately, I will not be able to see him at 3 PM. He is working on obtaining ENT consult from Dr. Luan Pulling and I will see him at his next appointment on 05/25/15 at 9 AM.  Thank you,  Genene Churn, [place] [phone]

## 2015-05-25 ENCOUNTER — Ambulatory Visit (HOSPITAL_COMMUNITY): Payer: Medicare Other | Attending: Pulmonary Disease | Admitting: Speech Pathology

## 2015-05-25 DIAGNOSIS — R1312 Dysphagia, oropharyngeal phase: Secondary | ICD-10-CM | POA: Insufficient documentation

## 2015-05-25 NOTE — Therapy (Signed)
[facility] [address] [place], [place], [address] Phone: [phone]   Fax:  [phone]  Speech Language Pathology Treatment  Patient Details  Name: Dominic Hodges MRN: [id number] Date of Birth: Sep 07, 1927 No Data Recorded  Encounter Date: 05/25/2015      End of Session - 05/25/15 1316    Visit Number 2   Number of Visits 3   Authorization Type Medicare   Authorization Time Period through 06/02/2015, will request extension to 07/03/2015 to allow for ENT appointment   SLP Start Time 0900   SLP Stop Time  0945   SLP Time Calculation (min) 45 min   Activity Tolerance Patient tolerated treatment well      Past Medical History  Diagnosis Date  . Hypertension   . Hyperlipidemia   . BPH (benign prostatic hyperplasia)     Past Surgical History  Procedure Laterality Date  . Appendectomy      approx 30 years ago/lwb  . Umbilical hernia repair      Approx 10 years ago/lwb  . Ureteral stone extraction    . Cataract extraction w/phaco Left 09/11/2012    Procedure: CATARACT EXTRACTION PHACO AND INTRAOCULAR LENS PLACEMENT (IOC);  Surgeon: Tonny Branch, MD;  Location: [facility];  Service: Ophthalmology;  Laterality: Left;  CDE: 19.52  . Hip arthroplasty Right 12/06/2014    Procedure: PARTIAL RIGHT HIP REPLACEMENT;  Surgeon: Carole Civil, MD;  Location: [facility];  Service: Orthopedics;  Laterality: Right;    There were no vitals filed for this visit.      Subjective Assessment - 05/25/15 1310    Subjective "The wedge helps a lot at night."   Patient is accompained by: Family member   Currently in Pain? 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 He had MBSS in December which showed flash penetration and NO aspiration of thin liquids; mild pharyngeal residuals which cleared with repeat swallow and liquid    Treatment Provided   Treatment provided Dysphagia   Dysphagia Treatment   Temperature Spikes Noted No   Respiratory Status Room air   Oral Cavity - Dentition Adequate natural dentition   Treatment Methods Skilled observation;Therapeutic exercise;Compensation strategy training;Patient/caregiver education   Patient observed directly with PO's Yes   Type of PO's observed Thin liquids   Feeding Able to feed self   Liquids provided via Cup   Type of cueing Verbal   Amount of cueing Minimal   Pain Assessment   Pain Assessment No/denies pain   Assessment / Recommendations / Plan   Plan Continue with current plan of care   Dysphagia Recommendations   Diet recommendations Regular;Thin liquid   Liquids provided via Cup   Medication Administration Whole meds with liquid   Supervision Patient able to self feed   Compensations Small sips/bites;Multiple dry swallows after each bite/sip   Postural Changes and/or Swallow Maneuvers Out of bed for meals;Seated upright 90 degrees;Upright 30-60 min after meal   General Recommendations  Oral Care Recommendations Oral care BID;Patient independent with oral care   Progression Toward Goals   Progression toward goals Progressing toward goals          SLP Education - 05/25/15 1315    Education provided Yes   Education Details CTAR (chin tuck against resistance) exercises, swallow recommendations (bolus hold with liquids, then swallow)   Person(s) Educated Patient   Methods Explanation;Demonstration;Verbal cues;Tactile cues   Comprehension Verbalized understanding;Need further instruction;Returned demonstration;Verbal cues required           SLP Short Term Goals - 05/25/15 1318    SLP SHORT TERM GOAL #1   Title Pt will demonstrate safe and efficient consumption of self regulated regular textures and thin liquids with use of strategies as needed.   Baseline Pt currently on regular textures and reports difficulty with nuts, seeds, and cornbread   Time 1   Period Months   Status On-going   SLP SHORT TERM GOAL #2   Title Pt will complete pharyngeal strengthening exercises as assigned 3x per day with use of written cues.   Baseline not completing   Time 1   Period Months   Status On-going   SLP SHORT TERM GOAL #3   Title Pt will implement vocal hygiene techniques with min cues (avoid throat clearing, raise HOB, hard swallow, etc).   Baseline not implementing   Time 1   Period Months   Status On-going          SLP Long Term Goals - 05/25/15 1318    SLP LONG TERM GOAL #1   Title Same as short term          Plan - 05/25/15 1318    Clinical Impression Statement Pt reports that he continues to have coughing after drinking liquids and occasionally "chokes" on his saliva. He does report improved sleep and control of nighttime symptoms with use of sleeping wedge for reflux. He notes improved vocal quality (less hoarse). Pt also recalls a time when he had difficulty swallowing pills last summer (June 2016) and felt he developed pill esophagitis. He now thinks that his swallowing problems started at that time. He voiced that he has concerns that his vocal folds are not adducting as they should and that he is aspirating. I again reviewed the MBSS from his hospitalization and reiterated that he likely had some component of reflux, swallow delay, and mild pharyngeal weakness. He wanted to know if there was anything to be done and I explained that the exercises given to him were to address this. MBSS did not show aspiration, however pt convinced that he is aspirating. He was surprised to hear that someone may cough due to any  residuals in pharynx (ie. That coughing is not an affirmation of aspiration) and that people cough for a variety of reasons. SLP cued pt to orally hold water bolus in in mouth for 1-2 seconds before attempting swallow and he reports positive effects of this- ie, no signs of aspiration and no coughing. Pt also instructed on implementation of CTAR exercises again and had pt demonstrate with a hard swallow as well. Pt would like to defer his final visit until after he as seen Dr. Benjamine Mola (ENT) which he thinks will take place on June 15. I moved his last appointment with me until June 19 to accommodate this. He will continue with exercises at home and implement reflux precautions and safe swallow strategies (oral bolus hold before attempting swallow).   Speech Therapy Frequency 1x /  week   Duration 2 weeks  2 visits over the next month   Treatment/Interventions Aspiration precaution training;Pharyngeal strengthening exercises;Diet toleration management by SLP;Compensatory techniques;SLP instruction and feedback;Patient/family education;Compensatory strategies   Potential to Achieve Goals Good   SLP Home Exercise Plan Pt will complete HEP as assigned to facilitate carryover of treatment strategies and techniques in home environment   Consulted and Agree with Plan of Care Patient      Patient will benefit from skilled therapeutic intervention in order to improve the following deficits and impairments:   Dysphagia, oropharyngeal phase    Problem List Patient Active Problem List   Diagnosis Date Noted  . Closed right hip fracture ([place]) 12/05/2014  . Closed hip fracture ([place]) 12/05/2014  . Hypertension 12/05/2014  . BPH (benign prostatic hyperplasia) 12/05/2014  . Hyperlipidemia 12/05/2014  . Essential hypertension, benign 06/01/2010  . NUMBNESS/TINGLING 11/20/2006  . BACK PAIN 11/20/2006   Thank you,  Genene Churn, [place]  [place] 05/25/2015, 1:19 PM  [facility] [address] [place], [place], [address] Phone: [phone]   Fax:  [phone]   Name: Ollin Ritts MRN: [id number] Date of Birth: 1927/03/28

## 2015-05-31 ENCOUNTER — Ambulatory Visit (HOSPITAL_COMMUNITY): Payer: Self-pay | Admitting: Speech Pathology

## 2015-06-15 DIAGNOSIS — I10 Essential (primary) hypertension: Secondary | ICD-10-CM | POA: Diagnosis not present

## 2015-06-15 DIAGNOSIS — Z23 Encounter for immunization: Secondary | ICD-10-CM | POA: Diagnosis not present

## 2015-06-15 DIAGNOSIS — E785 Hyperlipidemia, unspecified: Secondary | ICD-10-CM | POA: Diagnosis not present

## 2015-06-15 DIAGNOSIS — E559 Vitamin D deficiency, unspecified: Secondary | ICD-10-CM | POA: Diagnosis not present

## 2015-06-16 DIAGNOSIS — E785 Hyperlipidemia, unspecified: Secondary | ICD-10-CM | POA: Diagnosis not present

## 2015-06-16 DIAGNOSIS — E559 Vitamin D deficiency, unspecified: Secondary | ICD-10-CM | POA: Diagnosis not present

## 2015-06-27 ENCOUNTER — Telehealth (HOSPITAL_COMMUNITY): Payer: Self-pay | Admitting: Speech Pathology

## 2015-06-27 ENCOUNTER — Ambulatory Visit (HOSPITAL_COMMUNITY): Payer: Self-pay | Admitting: Speech Pathology

## 2015-06-27 NOTE — Telephone Encounter (Signed)
[name] states he wants to move today's apptment after he sees Dr. TO on Wed. I reschedule him for Thurs of this week. Izora Gala

## 2015-06-30 ENCOUNTER — Ambulatory Visit (HOSPITAL_COMMUNITY): Payer: Medicare Other | Admitting: Speech Pathology

## 2015-06-30 ENCOUNTER — Ambulatory Visit (INDEPENDENT_AMBULATORY_CARE_PROVIDER_SITE_OTHER): Payer: Medicare Other | Admitting: Otolaryngology

## 2015-06-30 DIAGNOSIS — K219 Gastro-esophageal reflux disease without esophagitis: Secondary | ICD-10-CM

## 2015-06-30 DIAGNOSIS — R1312 Dysphagia, oropharyngeal phase: Secondary | ICD-10-CM | POA: Diagnosis not present

## 2015-07-04 ENCOUNTER — Ambulatory Visit (HOSPITAL_COMMUNITY): Payer: Medicare Other | Attending: Pulmonary Disease | Admitting: Speech Pathology

## 2015-07-04 DIAGNOSIS — R1312 Dysphagia, oropharyngeal phase: Secondary | ICD-10-CM | POA: Diagnosis not present

## 2015-07-04 NOTE — Therapy (Signed)
[place], [place], [address] Phone: [phone]   Fax:  [phone]  Speech Language Pathology Treatment  Patient Details  Name: Dominic Hodges MRN: [id number] Date of Birth: 28-Feb-1927 No Data Recorded  Encounter Date: 07/04/2015      End of Session - 07/04/15 1228    Visit Number 3   Number of Visits 3   Authorization Type Medicare   Authorization Time Period through 06/02/2015, will request extension to 07/04/2015 to allow for ENT appointment   SLP Start Time 1040   SLP Stop Time  1115   SLP Time Calculation (min) 35 min   Activity Tolerance Patient tolerated treatment well      Past Medical History  Diagnosis Date  . Hypertension   . Hyperlipidemia   . BPH (benign prostatic hyperplasia)     Past Surgical History  Procedure Laterality Date  . Appendectomy      approx 30 years ago/lwb  . Umbilical hernia repair      Approx 10 years ago/lwb  . Ureteral stone extraction    . Cataract extraction w/phaco Left 09/11/2012    Procedure: CATARACT EXTRACTION PHACO AND INTRAOCULAR LENS PLACEMENT (IOC);  Surgeon: Tonny Branch, MD;  Location: [facility];  Service: Ophthalmology;  Laterality: Left;  CDE: 19.52  . Hip arthroplasty Right 12/06/2014    Procedure: PARTIAL RIGHT HIP REPLACEMENT;  Surgeon: Carole Civil, MD;  Location: [facility];  Service: Orthopedics;  Laterality: Right;    There were no vitals filed for this visit.      Subjective Assessment - 07/04/15 1226    Subjective "I think I am doing a lot better."   Patient is accompained by: Family member   Currently in Pain? 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 Luan Pulling for a clinical swallow evaluation  due to reports of dysphagia, excess phlegm, and intermittent hoarse vocal quality since his surgery for hip fracture after a fall in November 2016. He had MBSS in December which showed flash penetration and NO aspiration of thin liquids; mild pharyngeal residuals which cleared with repeat swallow and liquid    Treatment Provided   Treatment provided Dysphagia   Dysphagia Treatment   Temperature Spikes Noted No   Respiratory Status Room air   Oral Cavity - Dentition Adequate natural dentition   Treatment Methods Skilled observation;Therapeutic exercise;Compensation strategy training;Patient/caregiver education   Patient observed directly with PO's Yes   Type of PO's observed Thin liquids   Liquids provided via Cup   Amount of cueing Independent   Pain Assessment   Pain Assessment No/denies pain   Assessment / Recommendations / Plan   Plan Discharge SLP treatment due to (comment);All goals met   Dysphagia Recommendations   Diet recommendations Regular;Thin liquid   Liquids provided via Cup   Medication Administration Whole meds with liquid   Supervision Patient able to self feed   Compensations Small sips/bites;Multiple dry swallows after each bite/sip   Postural Changes and/or Swallow Maneuvers Out of bed for meals;Seated upright 90 degrees;Upright 30-60 min after meal   Progression Toward Goals   Progression toward goals Goals met, education completed, patient discharged from SLP  SLP Education - 07/04/15 1228    Education provided Yes   Education Details chin tuck against resistance exercises- continue going forward   Person(s) Educated Patient   Methods Explanation;Demonstration   Comprehension Verbalized understanding          SLP Short Term Goals - 07/04/15 1230    SLP SHORT TERM GOAL #1   Title Pt will demonstrate safe and efficient consumption of self regulated regular textures and thin liquids with use of strategies as needed.   Baseline Pt currently on  regular textures and reports difficulty with nuts, seeds, and cornbread   Time 1   Period Months   Status Achieved   SLP SHORT TERM GOAL #2   Title Pt will complete pharyngeal strengthening exercises as assigned 3x per day with use of written cues.   Baseline not completing   Time 1   Period Months   Status Achieved   SLP SHORT TERM GOAL #3   Title Pt will implement vocal hygiene techniques with min cues (avoid throat clearing, raise HOB, hard swallow, etc).   Baseline not implementing   Time 1   Period Months   Status Achieved          SLP Long Term Goals - 05/25/15 1318    SLP LONG TERM GOAL #1   Title Same as short term          Plan - 07/04/15 1229    Clinical Impression Statement [name] reports improved vocal quality, decreased coughing, and less phlegm in the AM. He saw Dr. Benjamine Mola, ENT last week and reports that it was noteable for "weakness only". Overall, he feels improved, but does endorse occasional stasis which he feels is in proximity to sternal notch region. SLP reiterated that it is often difficult for pt to pinpoint exact location due to referred pain/discomfort within esophagus. He wonders whether he should have his GI doctor complete EGD. He is not losing weight, nor complaining of significant "sticking" or difficulty swallowing. He purchased a wedge to sleep on for reflux precautions and feels this has provided some relief. Previous MBSS results reviewed with pt <<variable trace penetration of thins during the swallow with straw sips which did not completely clear with repeat swallow, but did with throat clear/cough and repeat swallow; variable mild to mild/mod vallecular residue after initial swallow which pt almost immediately clears by propelling back up to oral cavity and swallowing again. Pt with mild vallecular and pyriform/posterior pharyngeal wall just above UES which clears with liquid wash. Suspect residuals are primarily due to weakness as effortful  swallow was helpful in reducing residual. The barium table was transiently delayed in distal esophagus near GE junction, but passed after a few seconds. Recommend regular textures and thin liquids with compensatory strategies: avoid use of straw or institute cough/throat clear and repeat swallow with use, effortful swallow, swallow 2x for each bite, alternate solids and liquids as needed.>> SLP advised pt to continue with chin tuck against resistance exercises and compensatory swallowing strategies going forward. He is in agreement with plan of care. No further SLP services indicated at this time.     Duration --  2 visits over the next month   Treatment/Interventions Aspiration precaution training;Pharyngeal strengthening exercises;Diet toleration management by SLP;Compensatory techniques;SLP instruction and feedback;Patient/family education;Compensatory strategies   Potential to Achieve Goals Good   SLP Home Exercise Plan Pt will complete HEP as assigned to facilitate carryover of treatment strategies and techniques in home environment   Consulted and  Agree with Plan of Care Patient      Patient will benefit from skilled therapeutic intervention in order to improve the following deficits and impairments:   Dysphagia, oropharyngeal phase      G-Codes - 07/28/2015 1230    Functional Assessment Tool Used clinical judgment   Functional Limitations Swallowing   Swallow Goal Status (T9122) 0 percent impaired, limited or restricted   Swallow Discharge Status [id number]) 0 percent impaired, limited or restricted      Problem List Patient Active Problem List   Diagnosis Date Noted  . Closed right hip fracture ([place]) 12/05/2014  . Closed hip fracture ([place]) 12/05/2014  . Hypertension 12/05/2014  . BPH (benign prostatic hyperplasia) 12/05/2014  . Hyperlipidemia 12/05/2014  . Essential hypertension, benign 06/01/2010  . NUMBNESS/TINGLING 11/20/2006  . BACK PAIN 11/20/2006   SPEECH THERAPY DISCHARGE  SUMMARY  Visits from Start of Care: 3  Current functional level related to goals / functional outcomes: Goals met; pt consuming regular textures and thin liquids with use of strategies   Remaining deficits: N/A   Education / Equipment: Completed  Plan: Patient agrees to discharge.  Patient goals were met. Patient is being discharged due to meeting the stated rehab goals.  ?????       Thank you,  Genene Churn, [place]  [facility] July 28, 2015, 12:31 PM  [place] [address] [place], [place], [address] Phone: [phone]   Fax:  [phone]   Name: Dominic Hodges MRN: [id number] Date of Birth: 09-Apr-1927

## 2015-07-07 ENCOUNTER — Other Ambulatory Visit: Payer: Self-pay | Admitting: Pulmonary Disease

## 2015-07-07 DIAGNOSIS — M545 Low back pain, unspecified: Secondary | ICD-10-CM

## 2015-07-07 DIAGNOSIS — G8929 Other chronic pain: Secondary | ICD-10-CM

## 2015-07-08 ENCOUNTER — Other Ambulatory Visit: Payer: Self-pay | Admitting: Internal Medicine

## 2015-07-08 ENCOUNTER — Ambulatory Visit
Admission: RE | Admit: 2015-07-08 | Discharge: 2015-07-08 | Disposition: A | Discharge status: Home / Self Care | Payer: Medicare Other | Source: Ambulatory Visit | Attending: Pulmonary Disease | Admitting: Pulmonary Disease

## 2015-07-08 DIAGNOSIS — M4806 Spinal stenosis, lumbar region: Secondary | ICD-10-CM | POA: Diagnosis not present

## 2015-07-08 DIAGNOSIS — M545 Low back pain, unspecified: Secondary | ICD-10-CM

## 2015-07-08 DIAGNOSIS — G8929 Other chronic pain: Secondary | ICD-10-CM

## 2015-07-08 MED ORDER — METHYLPREDNISOLONE ACETATE 40 MG/ML INJ SUSP (RADIOLOG
120.0000 mg | Freq: Once | INTRAMUSCULAR | Status: AC
  Administered 2015-07-08: 120 mg via EPIDURAL

## 2015-07-08 MED ORDER — IOPAMIDOL (ISOVUE-M 200) INJECTION 41%
1.0000 mL | Freq: Once | INTRAMUSCULAR | Status: AC
  Administered 2015-07-08: 1 mL via EPIDURAL

## 2015-07-08 NOTE — Discharge Instructions (Signed)

## 2015-08-10 ENCOUNTER — Other Ambulatory Visit (HOSPITAL_COMMUNITY): Payer: Self-pay | Admitting: Internal Medicine

## 2015-08-10 DIAGNOSIS — M25551 Pain in right hip: Principal | ICD-10-CM

## 2015-08-10 DIAGNOSIS — G8929 Other chronic pain: Secondary | ICD-10-CM

## 2015-08-11 ENCOUNTER — Ambulatory Visit (INDEPENDENT_AMBULATORY_CARE_PROVIDER_SITE_OTHER): Payer: Medicare Other | Admitting: Otolaryngology

## 2015-08-11 DIAGNOSIS — K219 Gastro-esophageal reflux disease without esophagitis: Secondary | ICD-10-CM

## 2015-08-11 DIAGNOSIS — R1312 Dysphagia, oropharyngeal phase: Secondary | ICD-10-CM | POA: Diagnosis not present

## 2015-08-23 ENCOUNTER — Ambulatory Visit (HOSPITAL_COMMUNITY)
Admission: RE | Admit: 2015-08-23 | Discharge: 2015-08-23 | Disposition: A | Discharge status: Home / Self Care | Payer: Medicare Other | Source: Ambulatory Visit | Attending: Internal Medicine | Admitting: Internal Medicine

## 2015-08-23 DIAGNOSIS — M5136 Other intervertebral disc degeneration, lumbar region: Secondary | ICD-10-CM | POA: Insufficient documentation

## 2015-08-23 DIAGNOSIS — M545 Low back pain: Secondary | ICD-10-CM | POA: Diagnosis not present

## 2015-08-23 DIAGNOSIS — G8929 Other chronic pain: Secondary | ICD-10-CM

## 2015-08-23 DIAGNOSIS — M4806 Spinal stenosis, lumbar region: Secondary | ICD-10-CM | POA: Insufficient documentation

## 2015-08-23 DIAGNOSIS — M25551 Pain in right hip: Secondary | ICD-10-CM

## 2015-08-25 ENCOUNTER — Other Ambulatory Visit: Payer: Self-pay | Admitting: Pulmonary Disease

## 2015-08-25 DIAGNOSIS — M545 Low back pain: Principal | ICD-10-CM

## 2015-08-25 DIAGNOSIS — G8929 Other chronic pain: Secondary | ICD-10-CM

## 2015-09-06 ENCOUNTER — Other Ambulatory Visit: Payer: Self-pay | Admitting: Pulmonary Disease

## 2015-09-06 ENCOUNTER — Ambulatory Visit
Admission: RE | Admit: 2015-09-06 | Discharge: 2015-09-06 | Disposition: A | Discharge status: Home / Self Care | Payer: Medicare Other | Source: Ambulatory Visit | Attending: Pulmonary Disease | Admitting: Pulmonary Disease

## 2015-09-06 DIAGNOSIS — M5136 Other intervertebral disc degeneration, lumbar region: Secondary | ICD-10-CM | POA: Diagnosis not present

## 2015-09-06 DIAGNOSIS — G8929 Other chronic pain: Secondary | ICD-10-CM

## 2015-09-06 DIAGNOSIS — M545 Low back pain, unspecified: Secondary | ICD-10-CM

## 2015-09-06 MED ORDER — METHYLPREDNISOLONE ACETATE 40 MG/ML INJ SUSP (RADIOLOG
120.0000 mg | Freq: Once | INTRAMUSCULAR | Status: AC
  Administered 2015-09-06: 120 mg via EPIDURAL

## 2015-09-06 MED ORDER — IOPAMIDOL (ISOVUE-M 200) INJECTION 41%
1.0000 mL | Freq: Once | INTRAMUSCULAR | Status: AC
  Administered 2015-09-06: 1 mL via EPIDURAL

## 2015-09-16 DIAGNOSIS — M4807 Spinal stenosis, lumbosacral region: Secondary | ICD-10-CM | POA: Diagnosis not present

## 2015-09-16 DIAGNOSIS — M5136 Other intervertebral disc degeneration, lumbar region: Secondary | ICD-10-CM | POA: Diagnosis not present

## 2015-09-19 ENCOUNTER — Other Ambulatory Visit: Payer: Self-pay | Admitting: Orthopedic Surgery

## 2015-09-19 DIAGNOSIS — M4807 Spinal stenosis, lumbosacral region: Secondary | ICD-10-CM

## 2015-09-28 ENCOUNTER — Other Ambulatory Visit: Payer: Self-pay | Admitting: Orthopedic Surgery

## 2015-09-28 ENCOUNTER — Ambulatory Visit
Admission: RE | Admit: 2015-09-28 | Discharge: 2015-09-28 | Disposition: A | Discharge status: Home / Self Care | Payer: Medicare Other | Source: Ambulatory Visit | Attending: Orthopedic Surgery | Admitting: Orthopedic Surgery

## 2015-09-28 DIAGNOSIS — M4807 Spinal stenosis, lumbosacral region: Secondary | ICD-10-CM

## 2015-09-28 DIAGNOSIS — M4806 Spinal stenosis, lumbar region: Secondary | ICD-10-CM | POA: Diagnosis not present

## 2015-09-28 MED ORDER — IOPAMIDOL (ISOVUE-M 200) INJECTION 41%
15.0000 mL | Freq: Once | INTRAMUSCULAR | Status: AC
  Administered 2015-09-28: 15 mL via INTRATHECAL

## 2015-09-28 MED ORDER — DIAZEPAM 5 MG PO TABS: 5.0000 mg | ORAL_TABLET | Freq: Once | ORAL | Status: DC

## 2015-09-28 NOTE — Discharge Instructions (Signed)

## 2015-10-04 DIAGNOSIS — M4807 Spinal stenosis, lumbosacral region: Secondary | ICD-10-CM | POA: Diagnosis not present

## 2015-10-06 DIAGNOSIS — R5383 Other fatigue: Secondary | ICD-10-CM | POA: Diagnosis not present

## 2015-10-06 DIAGNOSIS — R739 Hyperglycemia, unspecified: Secondary | ICD-10-CM | POA: Diagnosis not present

## 2015-10-06 DIAGNOSIS — M791 Myalgia: Secondary | ICD-10-CM | POA: Diagnosis not present

## 2015-10-06 DIAGNOSIS — E559 Vitamin D deficiency, unspecified: Secondary | ICD-10-CM | POA: Diagnosis not present

## 2015-10-06 DIAGNOSIS — Z0389 Encounter for observation for other suspected diseases and conditions ruled out: Secondary | ICD-10-CM | POA: Diagnosis not present

## 2015-10-06 DIAGNOSIS — I10 Essential (primary) hypertension: Secondary | ICD-10-CM | POA: Diagnosis not present

## 2015-10-07 NOTE — Progress Notes (Signed)
Pt is being scheduled for preop appt; please place surgical orders in epic. Thanks.  

## 2015-10-10 NOTE — Progress Notes (Signed)
Clearance DrGoarani chart with LOV, cbc,cmp,hgba1c dated 10/06/15 on chart

## 2015-10-10 NOTE — Patient Instructions (Addendum)
Dominic Hodges  10/10/2015   Your procedure is scheduled on: 10/19/15 Report to [facility] Main  Entrance take Dominic Hodges  elevators to 3rd floor to  Dominic Hodges at [place] AM.  Call this number if you have problems the morning of surgery [phone]   Remember: ONLY 1 PERSON MAY GO WITH YOU TO SHORT STAY TO GET  READY MORNING OF Dominic Hodges. DO NOT TAKE ANYTHING BY MOUTH AFTER MIDNIGHT     Take these medicines the morning of surgery with A SIP OF WATER: NONE DO NOT TAKE ANY DIABETIC MEDICATIONS DAY OF YOUR SURGERY                               You may not have any metal on your body including hair pins and              piercings  Do not wear jewelry, make-up, lotions, powders or perfumes, deodorant             Do not wear nail polish.  Do not shave  48 hours prior to surgery.              Men may shave face and neck.   Do not bring valuables to the hospital. Dominic Hodges.  Contacts, dentures or bridgework may not be worn into surgery.  Leave suitcase in the car. After surgery it may be brought to your room.              Dominic Hodges - Preparing for Surgery Before surgery, you can play an important role.  Because skin is not sterile, your skin needs to be as free of germs as possible.  You can reduce the number of germs on your skin by washing with CHG (chlorahexidine gluconate) soap before surgery.  CHG is an antiseptic cleaner which kills germs and bonds with the skin to continue killing germs even after washing. Please DO NOT use if you have an allergy to CHG or antibacterial soaps.  If your skin becomes reddened/irritated stop using the CHG and inform your nurse when you arrive at Short Stay. Do not shave (including legs and underarms) for at least 48 hours prior to the first CHG shower.  You may shave your face/neck. Please follow these instructions carefully:  1.  Shower with CHG Soap the night before  surgery and the  morning of Surgery.  2.  If you choose to wash your hair, wash your hair first as usual with your  normal  shampoo.  3.  After you shampoo, rinse your hair and body thoroughly to remove the  shampoo.                           4.  Use CHG as you would any other liquid soap.  You can apply chg directly  to the skin and wash                       Gently with a scrungie or clean washcloth.  5.  Apply the CHG Soap to your body ONLY FROM THE NECK DOWN.   Do not use on face/ open  Wound or open sores. Avoid contact with eyes, ears mouth and genitals (private parts).                       Wash face,  Genitals (private parts) with your normal soap.             6.  Wash thoroughly, paying special attention to the area where your surgery  will be performed.  7.  Thoroughly rinse your body with warm water from the neck down.  8.  DO NOT shower/wash with your normal soap after using and rinsing off  the CHG Soap.                9.  Pat yourself dry with a clean towel.            10.  Wear clean pajamas.            11.  Place clean sheets on your bed the night of your first shower and do not  sleep with pets. Day of Surgery : Do not apply any lotions/deodorants the morning of surgery.  Please wear clean clothes to the hospital/surgery center.  FAILURE TO FOLLOW THESE INSTRUCTIONS MAY RESULT IN THE CANCELLATION OF YOUR SURGERY PATIENT SIGNATURE_________________________________  NURSE SIGNATURE__________________________________  ________________________________________________________________________                               Dominic Hodges  An incentive spirometer is a tool that can help keep your lungs clear and active. This tool measures how well you are filling your lungs with each breath. Taking long deep breaths may help reverse or decrease the chance of developing breathing (pulmonary) problems (especially infection) following:  A long period  of time when you are unable to move or be active. BEFORE THE PROCEDURE   If the spirometer includes an indicator to show your best effort, your nurse or respiratory therapist will set it to a desired goal.  If possible, sit up straight or lean slightly forward. Try not to slouch.  Hold the incentive spirometer in an upright position. INSTRUCTIONS FOR USE  1. Sit on the edge of your bed if possible, or sit up as far as you can in bed or on a chair. 2. Hold the incentive spirometer in an upright position. 3. Breathe out normally. 4. Place the mouthpiece in your mouth and seal your lips tightly around it. 5. Breathe in slowly and as deeply as possible, raising the piston or the ball toward the top of the column. 6. Hold your breath for 3-5 seconds or for as long as possible. Allow the piston or ball to fall to the bottom of the column. 7. Remove the mouthpiece from your mouth and breathe out normally. 8. Rest for a few seconds and repeat Steps 1 through 7 at least 10 times every 1-2 hours when you are awake. Take your time and take a few normal breaths between deep breaths. 9. The spirometer may include an indicator to show your best effort. Use the indicator as a goal to work toward during each repetition. 10. After each set of 10 deep breaths, practice coughing to be sure your lungs are clear. If you have an incision (the cut made at the time of surgery), support your incision when coughing by placing a pillow or rolled up towels firmly against it. Once you are able to get out of bed, walk around indoors  and cough well. You may stop using the incentive spirometer when instructed by your caregiver.  RISKS AND COMPLICATIONS  Take your time so you do not get dizzy or light-headed.  If you are in pain, you may need to take or ask for pain medication before doing incentive spirometry. It is harder to take a deep breath if you are having pain. AFTER USE  Rest and breathe slowly and easily.  It  can be helpful to keep track of a log of your progress. Your caregiver can provide you with a simple table to help with this. If you are using the spirometer at home, follow these instructions: Dominic Hodges IF:   You are having difficultly using the spirometer.  You have trouble using the spirometer as often as instructed.  Your pain medication is not giving enough relief while using the spirometer.  You develop fever of 100.5 F (38.1 C) or higher. SEEK IMMEDIATE MEDICAL CARE IF:   You cough up bloody sputum that had not been present before.  You develop fever of 102 F (38.9 C) or greater.  You develop worsening pain at or near the incision site. MAKE SURE YOU:   Understand these instructions.  Will watch your condition.  Will get help right away if you are not doing well or get worse. Document Released: 05/07/2006 Document Revised: 03/19/2011 Document Reviewed: 07/08/2006 [employer] Patient Information 2014 [employer], [place].   ________________________________________________________________________  WHAT IS A BLOOD TRANSFUSION? Blood Transfusion Information  A transfusion is the replacement of blood or some of its parts. Blood is made up of multiple cells which provide different functions.  Red blood cells carry oxygen and are used for blood loss replacement.  White blood cells fight against infection.  Platelets control bleeding.  Plasma helps clot blood.  Other blood products are available for specialized needs, such as hemophilia or other clotting disorders. BEFORE THE TRANSFUSION  Who gives blood for transfusions?   Healthy volunteers who are fully evaluated to make sure their blood is safe. This is blood bank blood. Transfusion therapy is the safest it has ever been in the practice of medicine. Before blood is taken from a donor, a complete history is taken to make sure that person has no history of diseases nor engages in risky social behavior (examples  are intravenous drug use or sexual activity with multiple partners). The donor's travel history is screened to minimize risk of transmitting infections, such as malaria. The donated blood is tested for signs of infectious diseases, such as HIV and hepatitis. The blood is then tested to be sure it is compatible with you in order to minimize the chance of a transfusion reaction. If you or a relative donates blood, this is often done in anticipation of surgery and is not appropriate for emergency situations. It takes many days to process the donated blood. RISKS AND COMPLICATIONS Although transfusion therapy is very safe and saves many lives, the main dangers of transfusion include:   Getting an infectious disease.  Developing a transfusion reaction. This is an allergic reaction to something in the blood you were given. Every precaution is taken to prevent this. The decision to have a blood transfusion has been considered carefully by your caregiver before blood is given. Blood is not given unless the benefits outweigh the risks. AFTER THE TRANSFUSION  Right after receiving a blood transfusion, you will usually feel much better and more energetic. This is especially true if your red blood cells have gotten low (  anemic). The transfusion raises the level of the red blood cells which carry oxygen, and this usually causes an energy increase.  The nurse administering the transfusion will monitor you carefully for complications. HOME CARE INSTRUCTIONS  No special instructions are needed after a transfusion. You may find your energy is better. Speak with your caregiver about any limitations on activity for underlying diseases you may have. SEEK MEDICAL CARE IF:   Your condition is not improving after your transfusion.  You develop redness or irritation at the intravenous (IV) site. SEEK IMMEDIATE MEDICAL CARE IF:  Any of the following symptoms occur over the next 12 hours:  Shaking chills.  You have a  temperature by mouth above 102 F (38.9 C), not controlled by medicine.  Chest, back, or muscle pain.  People around you feel you are not acting correctly or are confused.  Shortness of breath or difficulty breathing.  Dizziness and fainting.  You get a rash or develop hives.  You have a decrease in urine output.  Your urine turns a dark color or changes to pink, red, or brown. Any of the following symptoms occur over the next 10 days:  You have a temperature by mouth above 102 F (38.9 C), not controlled by medicine.  Shortness of breath.  Weakness after normal activity.  The white part of the eye turns yellow (jaundice).  You have a decrease in the amount of urine or are urinating less often.  Your urine turns a dark color or changes to pink, red, or brown. Document Released: 12/23/1999 Document Revised: 03/19/2011 Document Reviewed: 08/11/2007 [facility] Patient Information 2014 [place], [place].  _______________________________________________________________________

## 2015-10-11 ENCOUNTER — Ambulatory Visit (HOSPITAL_COMMUNITY)
Admission: RE | Admit: 2015-10-11 | Discharge: 2015-10-11 | Disposition: A | Discharge status: Home / Self Care | Payer: Medicare Other | Source: Ambulatory Visit | Attending: Surgical | Admitting: Surgical

## 2015-10-11 ENCOUNTER — Encounter (HOSPITAL_COMMUNITY)
Admission: RE | Admit: 2015-10-11 | Discharge: 2015-10-11 | Disposition: A | Discharge status: Home / Self Care | Payer: Medicare Other | Source: Ambulatory Visit | Attending: Orthopedic Surgery | Admitting: Orthopedic Surgery

## 2015-10-11 ENCOUNTER — Encounter (HOSPITAL_COMMUNITY): Payer: Self-pay

## 2015-10-11 DIAGNOSIS — M545 Low back pain, unspecified: Secondary | ICD-10-CM

## 2015-10-11 DIAGNOSIS — M5136 Other intervertebral disc degeneration, lumbar region: Secondary | ICD-10-CM | POA: Diagnosis not present

## 2015-10-11 DIAGNOSIS — Z01818 Encounter for other preprocedural examination: Secondary | ICD-10-CM | POA: Diagnosis not present

## 2015-10-11 DIAGNOSIS — M5126 Other intervertebral disc displacement, lumbar region: Secondary | ICD-10-CM | POA: Diagnosis not present

## 2015-10-11 DIAGNOSIS — M48061 Spinal stenosis, lumbar region without neurogenic claudication: Secondary | ICD-10-CM | POA: Diagnosis not present

## 2015-10-11 DIAGNOSIS — Z01810 Encounter for preprocedural cardiovascular examination: Secondary | ICD-10-CM | POA: Diagnosis not present

## 2015-10-11 DIAGNOSIS — Z01812 Encounter for preprocedural laboratory examination: Secondary | ICD-10-CM | POA: Insufficient documentation

## 2015-10-11 HISTORY — DX: Other complications of anesthesia, initial encounter: T88.59XA

## 2015-10-11 HISTORY — DX: Allergy status to unspecified drugs, medicaments and biological substances: Z88.9

## 2015-10-11 HISTORY — DX: Paralytic ileus: K56.0

## 2015-10-11 HISTORY — DX: Gastro-esophageal reflux disease without esophagitis: K21.9

## 2015-10-11 HISTORY — DX: Adverse effect of unspecified anesthetic, initial encounter: T41.45XA

## 2015-10-11 LAB — DIFFERENTIAL
Basophils Absolute: 0 K/uL (ref 0.0–0.1)
Basophils Relative: 0 %
Eosinophils Absolute: 0.1 K/uL (ref 0.0–0.7)
Eosinophils Relative: 1 %
Lymphocytes Relative: 18 %
Lymphs Abs: 1.4 K/uL (ref 0.7–4.0)
Monocytes Absolute: 0.6 K/uL (ref 0.1–1.0)
Monocytes Relative: 8 %
Neutro Abs: 5.8 K/uL (ref 1.7–7.7)
Neutrophils Relative %: 73 %

## 2015-10-11 LAB — ABO/RH: ABO/RH(D): A NEG

## 2015-10-11 LAB — CBC
HCT: 43.2 % (ref 39.0–52.0)
Hemoglobin: 14.7 g/dL (ref 13.0–17.0)
MCH: 31.2 pg (ref 26.0–34.0)
MCHC: 34 g/dL (ref 30.0–36.0)
MCV: 91.7 fL (ref 78.0–100.0)
Platelets: 194 K/uL (ref 150–400)
RBC: 4.71 MIL/uL (ref 4.22–5.81)
RDW: 13.5 % (ref 11.5–15.5)
WBC: 7.8 K/uL (ref 4.0–10.5)

## 2015-10-11 LAB — SURGICAL PCR SCREEN
MRSA, PCR: NEGATIVE
Staphylococcus aureus: NEGATIVE

## 2015-10-11 LAB — APTT: aPTT: 26 seconds (ref 24–36)

## 2015-10-11 LAB — PROTIME-INR
INR: 1.08
Prothrombin Time: 14 seconds (ref 11.4–15.2)

## 2015-10-11 NOTE — Progress Notes (Signed)
ekg from 2016,2014 placed on chart

## 2015-10-18 NOTE — H&P (Signed)
Dominic Hodges is an 82 y.o. male.   Chief Complaint: low back pain HPI: The patient is an 82 year old male who presents with the chief complaint of low back pain. He reports that he has had this for several months, but has started to develop pain in the left leg. He is experiencing claudication with walking. No numbness or tingling in the legs. He has had conservative treatments including oral and injectable corticosteroids. MRI of the lumbar spine showed spinal stenosis as well as a disc herniation at L3-L4 on the left.   Past Medical History:  Diagnosis Date  . Acid reflux    mild-states if doesnt chew food completely he begins to cough- states has weakness in muscles due to ilieus- has been doing exreci this  . Adynamic ileus ([place]) 12/2014   ? anesthesia related per pt  . BPH (benign prostatic hyperplasia)   . Complication of anesthesia    10/11/15- per pt "had [place]- in 11/16-related it to possibly anesthesia"  . H/O seasonal allergies   . Hyperlipidemia   . Hypertension     Past Surgical History:  Procedure Laterality Date  . APPENDECTOMY     approx 30 years ago/lwb  . CATARACT EXTRACTION W/PHACO Left 09/11/2012   Procedure: CATARACT EXTRACTION PHACO AND INTRAOCULAR LENS PLACEMENT (IOC);  Surgeon: Tonny Branch, MD;  Location: [facility];  Service: Ophthalmology;  Laterality: Left;  CDE: 19.52  . HIP ARTHROPLASTY Right 12/06/2014   Procedure: PARTIAL RIGHT HIP REPLACEMENT;  Surgeon: Carole Civil, MD;  Location: [facility];  Service: Orthopedics;  Laterality: Right;  . UMBILICAL HERNIA REPAIR     Approx 10 years ago/lwb  . ureteral stone extraction      Family History  Problem Relation Age of Onset  . Coronary artery disease      no family history of premature CAD   Social History:  reports that he has quit smoking. His smoking use included Pipe. He has never used smokeless tobacco. He reports that he drinks alcohol. He reports that he does not use  drugs.  Allergies:  Allergies  Allergen Reactions  . Robaxin [Methocarbamol] Other (See Comments)    "made me crazy"  . Robaxin [Methocarbamol] Other (See Comments)    Mental confusion     Current Outpatient Prescriptions:  .  aspirin EC 81 MG tablet, Take 81 mg by mouth daily., Disp: , Rfl:  .  cholecalciferol (VITAMIN D) 1000 units tablet, Take 5,000 Units by mouth daily., Disp: , Rfl:  .  cloNIDine (CATAPRES - DOSED IN MG/24 HR) 0.1 mg/24hr patch, Place 0.1 mg onto the skin once a week. TUESDAY, Disp: , Rfl:  .  rosuvastatin (CRESTOR) 20 MG tablet, Take 20 mg by mouth daily.  , Disp: , Rfl:   Review of Systems  Constitutional: Positive for malaise/fatigue. Negative for chills, diaphoresis, fever and weight loss.  HENT: Negative.   Eyes: Negative.   Respiratory: Negative.   Cardiovascular: Negative.   Gastrointestinal: Negative.   Genitourinary: Negative.   Musculoskeletal: Positive for back pain and myalgias. Negative for falls, joint pain and neck pain.  Skin: Negative.   Neurological: Negative.  Negative for weakness.  Endo/Heme/Allergies: Negative.   Psychiatric/Behavioral: Negative for depression, hallucinations, memory loss, substance abuse and suicidal ideas. The patient is nervous/anxious. The patient does not have insomnia.    Blood pressure (!) 158/73, pulse 73, temperature 98 F (36.7 C), temperature source Oral, resp. rate 18, height 6' 0.5" (  1.842 m), weight 73.9 kg (163 lb), SpO2 96 %.  Physical Exam  Constitutional: He is oriented to person, place, and time. He appears well-developed and well-nourished. No distress.  HENT:  Head: Normocephalic and atraumatic.  Right Ear: External ear normal.  Left Ear: External ear normal.  Nose: Nose normal.  Mouth/Throat: Oropharynx is clear and moist.  Eyes: Conjunctivae and EOM are normal.  Neck: Normal range of motion. Neck supple.  Cardiovascular: Normal rate, regular rhythm and intact distal pulses.   Murmur  heard.  Systolic murmur is present with a grade of 2/6  Respiratory: Effort normal and breath sounds normal. No respiratory distress. He has no wheezes.  GI: Soft. Bowel sounds are normal. He exhibits no distension. There is no tenderness.  Musculoskeletal:       Right hip: Normal.       Left hip: Normal.       Right knee: Normal.       Left knee: Normal.       Lumbar back: He exhibits decreased range of motion, pain and spasm. He exhibits no tenderness.  Neurological: He is alert and oriented to person, place, and time. He has normal strength. No sensory deficit.  Skin: No rash noted. He is not diaphoretic. No erythema.  Psychiatric: He has a normal mood and affect. His behavior is normal.     Assessment/Plan Lumbar spinal stenosis L3-L4 and disc herniation L3-L4 left He needs a central decompressive lumbar laminectomy at L3-L4, microdiscectomy at L3-4 on the left. Risks and benefits of the procedure discussed with the patient by Dr. Latanya Maudlin. The possible complications of spinal surgery number one could be infection, which is extremely rare. We do use antibiotics prior to the surgery and during surgery and after surgery. Number two is always a slight degree of probability that you could develop a blood clot in your leg after any type of surgery and we try our best to prevent that with aspirin post op when it is safe to begin. The third is a dural leak. That is the spinal fluid leak that could occur. At certain rare times the bone or the disc could literally stick to the dura which is the lining which contains the spinal fluid and we could develop a small tear in that lining which we then patch up. That is an extremely rare complication. The last and final complication is a recurrent disc rupture. That means that you could rupture another small piece of disc later on down the road and there is about a 2% chance of that.    H&P performed by Dr. Latanya Maudlin. MD Documented by Ardeen Jourdain, PA-C     [place], Fedor Kazmierski Ander Purpura, PA-C 10/18/2015, 4:41 PM

## 2015-10-19 ENCOUNTER — Ambulatory Visit (HOSPITAL_COMMUNITY)
Admission: RE | Admit: 2015-10-19 | Discharge: 2015-10-20 | Disposition: A | Discharge status: Home / Self Care | Payer: Medicare Other | Source: Ambulatory Visit | Attending: Orthopedic Surgery | Admitting: Orthopedic Surgery

## 2015-10-19 ENCOUNTER — Encounter (HOSPITAL_COMMUNITY): Payer: Self-pay | Admitting: *Deleted

## 2015-10-19 ENCOUNTER — Ambulatory Visit (HOSPITAL_COMMUNITY): Payer: Medicare Other

## 2015-10-19 ENCOUNTER — Encounter (HOSPITAL_COMMUNITY)
Admission: RE | Disposition: A | Discharge status: Home / Self Care | Payer: Self-pay | Source: Ambulatory Visit | Attending: Orthopedic Surgery

## 2015-10-19 ENCOUNTER — Ambulatory Visit (HOSPITAL_COMMUNITY): Payer: Medicare Other | Admitting: Registered Nurse

## 2015-10-19 DIAGNOSIS — N400 Enlarged prostate without lower urinary tract symptoms: Secondary | ICD-10-CM | POA: Insufficient documentation

## 2015-10-19 DIAGNOSIS — Z888 Allergy status to other drugs, medicaments and biological substances status: Secondary | ICD-10-CM | POA: Insufficient documentation

## 2015-10-19 DIAGNOSIS — M48061 Spinal stenosis, lumbar region without neurogenic claudication: Secondary | ICD-10-CM | POA: Diagnosis not present

## 2015-10-19 DIAGNOSIS — K219 Gastro-esophageal reflux disease without esophagitis: Secondary | ICD-10-CM | POA: Diagnosis not present

## 2015-10-19 DIAGNOSIS — M961 Postlaminectomy syndrome, not elsewhere classified: Secondary | ICD-10-CM | POA: Diagnosis not present

## 2015-10-19 DIAGNOSIS — Z7982 Long term (current) use of aspirin: Secondary | ICD-10-CM | POA: Insufficient documentation

## 2015-10-19 DIAGNOSIS — M5126 Other intervertebral disc displacement, lumbar region: Secondary | ICD-10-CM | POA: Insufficient documentation

## 2015-10-19 DIAGNOSIS — E785 Hyperlipidemia, unspecified: Secondary | ICD-10-CM | POA: Insufficient documentation

## 2015-10-19 DIAGNOSIS — Z9842 Cataract extraction status, left eye: Secondary | ICD-10-CM | POA: Diagnosis not present

## 2015-10-19 DIAGNOSIS — I10 Essential (primary) hypertension: Secondary | ICD-10-CM | POA: Diagnosis not present

## 2015-10-19 DIAGNOSIS — Z87891 Personal history of nicotine dependence: Secondary | ICD-10-CM | POA: Diagnosis not present

## 2015-10-19 DIAGNOSIS — M48062 Spinal stenosis, lumbar region with neurogenic claudication: Secondary | ICD-10-CM | POA: Diagnosis present

## 2015-10-19 DIAGNOSIS — Z419 Encounter for procedure for purposes other than remedying health state, unspecified: Secondary | ICD-10-CM

## 2015-10-19 DIAGNOSIS — Z961 Presence of intraocular lens: Secondary | ICD-10-CM | POA: Insufficient documentation

## 2015-10-19 HISTORY — PX: LUMBAR LAMINECTOMY/DECOMPRESSION MICRODISCECTOMY: SHX5026

## 2015-10-19 LAB — TYPE AND SCREEN
ABO/RH(D): A NEG
Antibody Screen: NEGATIVE

## 2015-10-19 SURGERY — LUMBAR LAMINECTOMY/DECOMPRESSION MICRODISCECTOMY 1 LEVEL
Anesthesia: General | Site: Back

## 2015-10-19 MED ORDER — PROPOFOL 10 MG/ML IV BOLUS
INTRAVENOUS | Status: DC | PRN
  Administered 2015-10-19: 100 mg via INTRAVENOUS

## 2015-10-19 MED ORDER — LACTATED RINGERS IV SOLN: INTRAVENOUS | Status: DC

## 2015-10-19 MED ORDER — CEFAZOLIN SODIUM-DEXTROSE 2-4 GM/100ML-% IV SOLN
INTRAVENOUS | Status: AC
  Filled 2015-10-19: qty 100

## 2015-10-19 MED ORDER — ONDANSETRON HCL 4 MG/2ML IJ SOLN: 4.0000 mg | INTRAMUSCULAR | Status: DC | PRN

## 2015-10-19 MED ORDER — POLYETHYLENE GLYCOL 3350 17 G PO PACK: 17.0000 g | PACK | Freq: Every day | ORAL | Status: DC | PRN

## 2015-10-19 MED ORDER — SODIUM CHLORIDE 0.9 % IR SOLN
Status: AC
  Filled 2015-10-19: qty 500000

## 2015-10-19 MED ORDER — MEPERIDINE HCL 50 MG/ML IJ SOLN: 6.2500 mg | INTRAMUSCULAR | Status: DC | PRN

## 2015-10-19 MED ORDER — FENTANYL CITRATE (PF) 100 MCG/2ML IJ SOLN
INTRAMUSCULAR | Status: AC
  Filled 2015-10-19: qty 4

## 2015-10-19 MED ORDER — LACTATED RINGERS IV SOLN
INTRAVENOUS | Status: DC
Start: 2015-10-19 — End: 2015-10-20
  Administered 2015-10-19: 1000 mL via INTRAVENOUS
  Administered 2015-10-20: 03:00:00 via INTRAVENOUS

## 2015-10-19 MED ORDER — OXYCODONE-ACETAMINOPHEN 5-325 MG PO TABS: 1.0000 | ORAL_TABLET | ORAL | Status: DC | PRN

## 2015-10-19 MED ORDER — ONDANSETRON HCL 4 MG/2ML IJ SOLN
INTRAMUSCULAR | Status: DC | PRN
  Administered 2015-10-19: 4 mg via INTRAVENOUS

## 2015-10-19 MED ORDER — BISACODYL 5 MG PO TBEC: 5.0000 mg | DELAYED_RELEASE_TABLET | Freq: Every day | ORAL | Status: DC | PRN

## 2015-10-19 MED ORDER — DEXAMETHASONE SODIUM PHOSPHATE 10 MG/ML IJ SOLN
INTRAMUSCULAR | Status: AC
  Filled 2015-10-19: qty 1

## 2015-10-19 MED ORDER — SUGAMMADEX SODIUM 200 MG/2ML IV SOLN
INTRAVENOUS | Status: DC | PRN
  Administered 2015-10-19: 200 mg via INTRAVENOUS

## 2015-10-19 MED ORDER — CEFAZOLIN IN D5W 1 GM/50ML IV SOLN
1.0000 g | Freq: Three times a day (TID) | INTRAVENOUS | Status: DC
  Administered 2015-10-19 (×2): 1 g via INTRAVENOUS
  Filled 2015-10-19 (×3): qty 50

## 2015-10-19 MED ORDER — SODIUM CHLORIDE 0.9 % IR SOLN
Status: DC | PRN
  Administered 2015-10-19: 500 mL

## 2015-10-19 MED ORDER — BACITRACIN-NEOMYCIN-POLYMYXIN 400-5-5000 EX OINT
TOPICAL_OINTMENT | CUTANEOUS | Status: AC
  Filled 2015-10-19: qty 1

## 2015-10-19 MED ORDER — SUGAMMADEX SODIUM 200 MG/2ML IV SOLN
INTRAVENOUS | Status: AC
  Filled 2015-10-19: qty 2

## 2015-10-19 MED ORDER — ACETAMINOPHEN 650 MG RE SUPP: 650.0000 mg | RECTAL | Status: DC | PRN

## 2015-10-19 MED ORDER — HYDROCODONE-ACETAMINOPHEN 5-325 MG PO TABS: 1.0000 | ORAL_TABLET | ORAL | Status: DC | PRN

## 2015-10-19 MED ORDER — PROPOFOL 10 MG/ML IV BOLUS
INTRAVENOUS | Status: AC
  Filled 2015-10-19: qty 20

## 2015-10-19 MED ORDER — ROCURONIUM BROMIDE 10 MG/ML (PF) SYRINGE
PREFILLED_SYRINGE | INTRAVENOUS | Status: AC
  Filled 2015-10-19: qty 10

## 2015-10-19 MED ORDER — ACETAMINOPHEN 325 MG PO TABS
650.0000 mg | ORAL_TABLET | ORAL | Status: DC | PRN
  Administered 2015-10-20: 650 mg via ORAL
  Filled 2015-10-19: qty 2

## 2015-10-19 MED ORDER — FLEET ENEMA 7-19 GM/118ML RE ENEM: 1.0000 | ENEMA | Freq: Once | RECTAL | Status: DC | PRN

## 2015-10-19 MED ORDER — ROCURONIUM BROMIDE 10 MG/ML (PF) SYRINGE
PREFILLED_SYRINGE | INTRAVENOUS | Status: DC | PRN
  Administered 2015-10-19: 50 mg via INTRAVENOUS
  Administered 2015-10-19: 10 mg via INTRAVENOUS

## 2015-10-19 MED ORDER — ONDANSETRON HCL 4 MG/2ML IJ SOLN
INTRAMUSCULAR | Status: AC
  Filled 2015-10-19: qty 2

## 2015-10-19 MED ORDER — BACITRACIN ZINC 500 UNIT/GM EX OINT
TOPICAL_OINTMENT | CUTANEOUS | Status: DC | PRN
  Administered 2015-10-19: 1 via TOPICAL

## 2015-10-19 MED ORDER — LIDOCAINE 2% (20 MG/ML) 5 ML SYRINGE
INTRAMUSCULAR | Status: AC
  Filled 2015-10-19: qty 5

## 2015-10-19 MED ORDER — HYDROCODONE-ACETAMINOPHEN 5-325 MG PO TABS: 1.0000 | ORAL_TABLET | ORAL | 0 refills | Status: DC | PRN

## 2015-10-19 MED ORDER — FENTANYL CITRATE (PF) 100 MCG/2ML IJ SOLN
25.0000 ug | INTRAMUSCULAR | Status: DC | PRN
  Administered 2015-10-19: 25 ug via INTRAVENOUS

## 2015-10-19 MED ORDER — HYDROMORPHONE HCL 1 MG/ML IJ SOLN: 0.5000 mg | INTRAMUSCULAR | Status: DC | PRN

## 2015-10-19 MED ORDER — BUPIVACAINE HCL (PF) 0.5 % IJ SOLN
INTRAMUSCULAR | Status: AC
  Filled 2015-10-19: qty 30

## 2015-10-19 MED ORDER — EPHEDRINE SULFATE 50 MG/ML IJ SOLN
INTRAMUSCULAR | Status: DC | PRN
  Administered 2015-10-19: 10 mg via INTRAVENOUS
  Administered 2015-10-19: 5 mg via INTRAVENOUS

## 2015-10-19 MED ORDER — MENTHOL 3 MG MT LOZG: 1.0000 | LOZENGE | OROMUCOSAL | Status: DC | PRN

## 2015-10-19 MED ORDER — BUPIVACAINE LIPOSOME 1.3 % IJ SUSP
20.0000 mL | Freq: Once | INTRAMUSCULAR | Status: DC
  Filled 2015-10-19: qty 20

## 2015-10-19 MED ORDER — LACTATED RINGERS IV SOLN
INTRAVENOUS | Status: DC
  Administered 2015-10-19 (×2): via INTRAVENOUS

## 2015-10-19 MED ORDER — CLONIDINE HCL 0.1 MG/24HR TD PTWK
0.1000 mg | MEDICATED_PATCH | TRANSDERMAL | Status: DC
  Administered 2015-10-19: 0.1 mg via TRANSDERMAL
  Filled 2015-10-19: qty 1

## 2015-10-19 MED ORDER — FENTANYL CITRATE (PF) 100 MCG/2ML IJ SOLN
INTRAMUSCULAR | Status: AC
  Administered 2015-10-19: 25 ug via INTRAVENOUS
  Filled 2015-10-19: qty 2

## 2015-10-19 MED ORDER — FENTANYL CITRATE (PF) 100 MCG/2ML IJ SOLN
INTRAMUSCULAR | Status: DC | PRN
  Administered 2015-10-19 (×4): 50 ug via INTRAVENOUS

## 2015-10-19 MED ORDER — PROMETHAZINE HCL 25 MG/ML IJ SOLN: 6.2500 mg | INTRAMUSCULAR | Status: DC | PRN

## 2015-10-19 MED ORDER — INFLUENZA VAC SPLIT QUAD 0.5 ML IM SUSY
0.5000 mL | PREFILLED_SYRINGE | INTRAMUSCULAR | Status: AC
  Administered 2015-10-20: 0.5 mL via INTRAMUSCULAR
  Filled 2015-10-19: qty 0.5

## 2015-10-19 MED ORDER — PHENAZOPYRIDINE HCL 200 MG PO TABS
200.0000 mg | ORAL_TABLET | Freq: Three times a day (TID) | ORAL | Status: DC
  Administered 2015-10-19 – 2015-10-20 (×2): 200 mg via ORAL
  Filled 2015-10-19 (×3): qty 1

## 2015-10-19 MED ORDER — PHENOL 1.4 % MT LIQD
1.0000 | OROMUCOSAL | Status: DC | PRN
  Filled 2015-10-19: qty 177

## 2015-10-19 MED ORDER — CEFAZOLIN SODIUM-DEXTROSE 2-4 GM/100ML-% IV SOLN
2.0000 g | INTRAVENOUS | Status: AC
  Administered 2015-10-19: 2 g via INTRAVENOUS

## 2015-10-19 MED ORDER — BUPIVACAINE HCL (PF) 0.5 % IJ SOLN
INTRAMUSCULAR | Status: DC | PRN
  Administered 2015-10-19: 20 mL

## 2015-10-19 MED ORDER — DEXAMETHASONE SODIUM PHOSPHATE 10 MG/ML IJ SOLN
INTRAMUSCULAR | Status: DC | PRN
  Administered 2015-10-19: 10 mg via INTRAVENOUS

## 2015-10-19 MED ORDER — BUPIVACAINE LIPOSOME 1.3 % IJ SUSP
INTRAMUSCULAR | Status: DC | PRN
  Administered 2015-10-19: 20 mL

## 2015-10-19 MED ORDER — EPHEDRINE 5 MG/ML INJ
INTRAVENOUS | Status: AC
  Filled 2015-10-19: qty 10

## 2015-10-19 MED ORDER — LIDOCAINE 2% (20 MG/ML) 5 ML SYRINGE
INTRAMUSCULAR | Status: DC | PRN
  Administered 2015-10-19: 50 mg via INTRAVENOUS

## 2015-10-19 SURGICAL SUPPLY — 46 items
AGENT HMST SPONGE THK3/8 (HEMOSTASIS) ×1
BAG SPEC THK2 15X12 ZIP CLS (MISCELLANEOUS)
BAG ZIPLOCK 12X15 (MISCELLANEOUS) IMPLANT
CLEANER TIP ELECTROSURG 2X2 (MISCELLANEOUS) ×2 IMPLANT
DRAPE MICROSCOPE LEICA (MISCELLANEOUS) ×2 IMPLANT
DRAPE POUCH INSTRU U-SHP 10X18 (DRAPES) ×2 IMPLANT
DRAPE SHEET LG 3/4 BI-LAMINATE (DRAPES) ×2 IMPLANT
DRAPE SURG 17X11 SM STRL (DRAPES) ×2 IMPLANT
DRSG ADAPTIC 3X8 NADH LF (GAUZE/BANDAGES/DRESSINGS) ×2 IMPLANT
DRSG PAD ABDOMINAL 8X10 ST (GAUZE/BANDAGES/DRESSINGS) ×4 IMPLANT
DURAPREP 26ML APPLICATOR (WOUND CARE) ×2 IMPLANT
ELECT BLADE TIP CTD 4 INCH (ELECTRODE) ×2 IMPLANT
ELECT REM PT RETURN 9FT ADLT (ELECTROSURGICAL) ×2
ELECTRODE REM PT RTRN 9FT ADLT (ELECTROSURGICAL) ×1 IMPLANT
GAUZE SPONGE 4X4 12PLY STRL (GAUZE/BANDAGES/DRESSINGS) ×2 IMPLANT
GLOVE BIOGEL PI IND STRL 6.5 (GLOVE) IMPLANT
GLOVE BIOGEL PI IND STRL 8 (GLOVE) ×1 IMPLANT
GLOVE BIOGEL PI INDICATOR 6.5 (GLOVE) ×4
GLOVE BIOGEL PI INDICATOR 8 (GLOVE) ×1
GLOVE ECLIPSE 8.0 STRL XLNG CF (GLOVE) ×2 IMPLANT
GOWN STRL REUS W/TWL XL LVL3 (GOWN DISPOSABLE) ×4 IMPLANT
HEMOSTAT SPONGE AVITENE ULTRA (HEMOSTASIS) ×2 IMPLANT
KIT BASIN OR (CUSTOM PROCEDURE TRAY) ×2 IMPLANT
KIT POSITIONING SURG ANDREWS (MISCELLANEOUS) IMPLANT
MANIFOLD NEPTUNE II (INSTRUMENTS) ×2 IMPLANT
MARKER SKIN DUAL TIP RULER LAB (MISCELLANEOUS) ×2 IMPLANT
NDL SPNL 18GX3.5 QUINCKE PK (NEEDLE) ×2 IMPLANT
NEEDLE HYPO 22GX1.5 SAFETY (NEEDLE) ×2 IMPLANT
NEEDLE SPNL 18GX3.5 QUINCKE PK (NEEDLE) ×4 IMPLANT
PACK LAMINECTOMY ORTHO (CUSTOM PROCEDURE TRAY) ×2 IMPLANT
PATTIES SURGICAL .5 X.5 (GAUZE/BANDAGES/DRESSINGS) IMPLANT
PATTIES SURGICAL .75X.75 (GAUZE/BANDAGES/DRESSINGS) ×2 IMPLANT
PATTIES SURGICAL 1X1 (DISPOSABLE) ×2 IMPLANT
RUBBERBAND STERILE (MISCELLANEOUS) ×2 IMPLANT
SPONGE LAP 4X18 X RAY DECT (DISPOSABLE) ×4 IMPLANT
STAPLER VISISTAT 35W (STAPLE) ×2 IMPLANT
SUT VIC AB 0 CT1 27 (SUTURE) ×2
SUT VIC AB 0 CT1 27XBRD ANTBC (SUTURE) ×1 IMPLANT
SUT VIC AB 1 CT1 27 (SUTURE) ×6
SUT VIC AB 1 CT1 27XBRD ANTBC (SUTURE) ×3 IMPLANT
SUT VIC AB 2-0 CT1 27 (SUTURE)
SUT VIC AB 2-0 CT1 TAPERPNT 27 (SUTURE) IMPLANT
SYR 20CC LL (SYRINGE) ×4 IMPLANT
TAPE CLOTH SURG 4X10 WHT LF (GAUZE/BANDAGES/DRESSINGS) ×1 IMPLANT
TOWEL OR 17X26 10 PK STRL BLUE (TOWEL DISPOSABLE) ×2 IMPLANT
TRAY FOLEY BAG SILVER LF 16FR (SET/KITS/TRAYS/PACK) ×1 IMPLANT

## 2015-10-19 NOTE — Transfer of Care (Signed)
Immediate Anesthesia Transfer of Care Note  Patient: Dominic Hodges  Procedure(s) Performed: Procedure(s): CENTRAL DECOMPRESSION LUMBAR LAMINECTOMY FOR SPINAL STENOSIS, L3,L4FORAMINOTOMY BILATERAL L3,L4 NERVE ROOT (N/A)  Patient Location: PACU  Anesthesia Type:General  Level of Consciousness:  sedated, patient cooperative and responds to stimulation  Airway & Oxygen Therapy:Patient Spontanous Breathing and Patient connected to face mask oxgen  Post-op Assessment:  Report given to PACU RN and Post -op Vital signs reviewed and stable  Post vital signs:  Reviewed and stable  Last Vitals:  Vitals:   10/19/15 0652  BP: (!) 158/73  Pulse: 73  Resp: 18  Temp: 12.9 C    Complications: No apparent anesthesia complications

## 2015-10-19 NOTE — Progress Notes (Signed)
Pt having complaints of burning upon urination that is getting progressively worse and keeping him awake. [place] on-call PA Dierdre Highman. Received new orders for pyridium 200mg  TID PO.

## 2015-10-19 NOTE — Interval H&P Note (Signed)
History and Physical Interval Note:  10/19/2015 8:17 AM  Dominic Hodges  has presented today for surgery, with the diagnosis of lumbar spinal stenosis L3-4 and h&P L3-4 left   The various methods of treatment have been discussed with the patient and family. After consideration of risks, benefits and other options for treatment, the patient has consented to  Procedure(s): LUMBAR LAMINECTOMY/DECOMPRESSION MICRODISCECTOMY 1 LEVEL L3-4 (N/A) as a surgical intervention .  The patient's history has been reviewed, patient examined, no change in status, stable for surgery.  I have reviewed the patient's chart and labs.  Questions were answered to the patient's satisfaction.     Deborahann Poteat A

## 2015-10-19 NOTE — Brief Op Note (Signed)
10/19/2015  10:16 AM  PATIENT:  Yichen G McInnis Jr  82 y.o. male  PRE-OPERATIVE DIAGNOSIS:  lumbar spinal stenosis L3-4 and  Foraminal Stenosis involving the L-3 and L-4 Nerve Roots,Bliaterally.   POST-OPERATIVE DIAGNOSIS: Same as Pre-Op  PROCEDURE:  Procedure(s): CENTRAL DECOMPRESSION LUMBAR LAMINECTOMY FOR SPINAL STENOSIS, L3,L4FORAMINOTOMY BILATERAL L3,L4 NERVE ROOT (N/A)  SURGEON:  Surgeon(s) and Role:    * Latanya Maudlin, MD - Primary  PHYSICIAN ASSISTANT:Amber [place] PA   ASSISTANTS: Ardeen Jourdain PA  ANESTHESIA:   general  EBL:  Total I/O In: 1400 [I.V.:1400] Out: 250 [Urine:200; Blood:50]  BLOOD ADMINISTERED:none  DRAINS: none   LOCAL MEDICATIONS USED:  MARCAINE  20cc of 0.50% Plain at the stary of the case and 20cc of Exparel at the end of the case.   SPECIMEN:  No Specimen  DISPOSITION OF SPECIMEN:  N/A  COUNTS:  YES  TOURNIQUET:  * No tourniquets in log *  DICTATION: .Other Dictation: Dictation Number [id number]  PLAN OF CARE: Admit for overnight observation  PATIENT DISPOSITION:  Stable in OR   Delay start of Pharmacological VTE agent (>24hrs) due to surgical blood loss or risk of bleeding: yes

## 2015-10-19 NOTE — Anesthesia Preprocedure Evaluation (Addendum)
Anesthesia Evaluation  Patient identified by MRN, date of birth, ID band Patient awake    Reviewed: Allergy & Precautions, NPO status , Patient's Chart, lab work & pertinent test results  Airway Mallampati: III  TM Distance: >3 FB Neck ROM: Full    Dental  (+) Teeth Intact, Dental Advisory Given,    Pulmonary former smoker,    breath sounds clear to auscultation       Cardiovascular hypertension, Pt. on medications  Rhythm:Regular Rate:Normal     Neuro/Psych  Neuromuscular disease negative psych ROS   GI/Hepatic Neg liver ROS, GERD  ,  Endo/Other  negative endocrine ROS  Renal/GU negative Renal ROS  negative genitourinary   Musculoskeletal negative musculoskeletal ROS (+)   Abdominal   Peds negative pediatric ROS (+)  Hematology negative hematology ROS (+)   Anesthesia Other Findings   Reproductive/Obstetrics negative OB ROS                            Lab Results  Component Value Date   WBC 7.8 10/11/2015   HGB 14.7 10/11/2015   HCT 43.2 10/11/2015   MCV 91.7 10/11/2015   PLT 194 10/11/2015   Lab Results  Component Value Date   CREATININE 1.72 (H) 12/15/2014   BUN 43 (H) 12/15/2014   NA 140 12/15/2014   K 3.3 (L) 12/15/2014   CL 103 12/15/2014   CO2 30 12/15/2014   Lab Results  Component Value Date   INR 1.08 10/11/2015   INR 1.15 12/05/2014   10/2015 EKG: normal sinus rhythm.  Anesthesia Physical Anesthesia Plan  ASA: II  Anesthesia Plan: General   Post-op Pain Management:    Induction: Intravenous  Airway Management Planned: Oral ETT  Additional Equipment:   Intra-op Plan:   Post-operative Plan: Extubation in OR  Informed Consent: I have reviewed the patients History and Physical, chart, labs and discussed the procedure including the risks, benefits and alternatives for the proposed anesthesia with the patient or authorized representative who has indicated  his/her understanding and acceptance.   Dental advisory given  Plan Discussed with: CRNA  Anesthesia Plan Comments: (Pt states clonidine patch is on the last day before removal. Patch was removed in preop. He will have his wife bring new clonidine patch from home tonight or in AM. )       Anesthesia Quick Evaluation

## 2015-10-19 NOTE — Progress Notes (Signed)
6th floor notified pt will be in 1621 in 20 minutes

## 2015-10-19 NOTE — Anesthesia Procedure Notes (Signed)
Procedure Name: Intubation Date/Time: 10/19/2015 8:33 AM Performed by: Talbot Grumbling Pre-anesthesia Checklist: Patient identified, Emergency Drugs available, Suction available and Patient being monitored Patient Re-evaluated:Patient Re-evaluated prior to inductionOxygen Delivery Method: Circle system utilized Preoxygenation: Pre-oxygenation with 100% oxygen Intubation Type: IV induction Ventilation: Mask ventilation without difficulty Laryngoscope Size: Miller and 2 Grade View: Grade II Tube type: Oral Tube size: 7.5 mm Number of attempts: 1 Airway Equipment and Method: Stylet Placement Confirmation: ETT inserted through vocal cords under direct vision,  positive ETCO2 and breath sounds checked- equal and bilateral Secured at: 22 cm Tube secured with: Tape Dental Injury: Teeth and Oropharynx as per pre-operative assessment

## 2015-10-19 NOTE — Anesthesia Postprocedure Evaluation (Signed)
Anesthesia Post Note  Patient: Dominic Hodges  Procedure(s) Performed: Procedure(s) (LRB): CENTRAL DECOMPRESSION LUMBAR LAMINECTOMY FOR SPINAL STENOSIS, L3,L4FORAMINOTOMY BILATERAL L3,L4 NERVE ROOT (N/A)  Patient location during evaluation: PACU Anesthesia Type: General Level of consciousness: awake and alert Pain management: pain level controlled Vital Signs Assessment: post-procedure vital signs reviewed and stable Respiratory status: spontaneous breathing, nonlabored ventilation, respiratory function stable and patient connected to nasal cannula oxygen Cardiovascular status: blood pressure returned to baseline and stable Postop Assessment: no signs of nausea or vomiting Anesthetic complications: no    Last Vitals:  Vitals:   10/19/15 1130 10/19/15 1145  BP: (!) 158/82 (!) 160/73  Pulse: 87 72  Resp: (!) 21 15  Temp:      Last Pain:  Vitals:   10/19/15 1145  TempSrc:   PainSc: [place] Lene Mckay

## 2015-10-19 NOTE — H&P (Signed)
Dominic Hodges is an 82 y.o. male.   Chief Complaint: Back and Left Leg Pain HPI: Progressive pain in his left lower extremity with weakness.  Past Medical History:  Diagnosis Date  . Acid reflux    mild-states if doesnt chew food completely he begins to cough- states has weakness in muscles due to ilieus- has been doing exreci this  . Adynamic ileus ([place]) 12/2014   ? anesthesia related per pt  . BPH (benign prostatic hyperplasia)   . Complication of anesthesia    10/11/15- per pt "had [place]- in 11/16-related it to possibly anesthesia"  . H/O seasonal allergies   . Hyperlipidemia   . Hypertension     Past Surgical History:  Procedure Laterality Date  . APPENDECTOMY     approx 30 years ago/lwb  . CATARACT EXTRACTION W/PHACO Left 09/11/2012   Procedure: CATARACT EXTRACTION PHACO AND INTRAOCULAR LENS PLACEMENT (IOC);  Surgeon: Tonny Branch, MD;  Location: [facility];  Service: Ophthalmology;  Laterality: Left;  CDE: 19.52  . HIP ARTHROPLASTY Right 12/06/2014   Procedure: PARTIAL RIGHT HIP REPLACEMENT;  Surgeon: Carole Civil, MD;  Location: [facility];  Service: Orthopedics;  Laterality: Right;  . UMBILICAL HERNIA REPAIR     Approx 10 years ago/lwb  . ureteral stone extraction      Family History  Problem Relation Age of Onset  . Coronary artery disease      no family history of premature CAD   Social History:  reports that he has quit smoking. His smoking use included Pipe. He has never used smokeless tobacco. He reports that he drinks alcohol. He reports that he does not use drugs.  Allergies:  Allergies  Allergen Reactions  . Robaxin [Methocarbamol] Other (See Comments)    "made me crazy"  . Robaxin [Methocarbamol] Other (See Comments)    Mental confusion    Medications Prior to Admission  Medication Sig Dispense Refill  . aspirin EC 81 MG tablet Take 81 mg by mouth daily.    . cholecalciferol (VITAMIN D) 1000 units tablet Take 5,000 Units  by mouth daily.    . cloNIDine (CATAPRES - DOSED IN MG/24 HR) 0.1 mg/24hr patch Place 0.1 mg onto the skin once a week. TUESDAY    . rosuvastatin (CRESTOR) 20 MG tablet Take 20 mg by mouth daily.        No results found for this or any previous visit (from the past 48 hour(s)). No results found.  ROS  Blood pressure (!) 158/73, pulse 73, temperature 98 F (36.7 C), temperature source Oral, resp. rate 18, height 6' 0.5" (1.842 m), weight 73.9 kg (163 lb), SpO2 96 %. Physical Exam   Central Decompression of L3-L-4  Kameren Pargas A, MD 10/19/2015, 8:13 AM

## 2015-10-19 NOTE — Discharge Instructions (Signed)
For the first three days, remove your dressing and tape a piece of saran wrap over your incision  Take your shower, then remove the saran wrap and put a clean dressing on. After three days you can shower without the saran wrap.  Call Dr. Gladstone Lighter if any wound complications or temperature of 101 degrees F or over.  No lifting and no driving while taking pain medication Call the office for an appointment to see Dr. Gladstone Lighter in two weeks: [phone] and ask for Dr. Charlestine Night nurse, Brunilda Payor.

## 2015-10-20 DIAGNOSIS — I10 Essential (primary) hypertension: Secondary | ICD-10-CM | POA: Diagnosis not present

## 2015-10-20 DIAGNOSIS — E785 Hyperlipidemia, unspecified: Secondary | ICD-10-CM | POA: Diagnosis not present

## 2015-10-20 DIAGNOSIS — N400 Enlarged prostate without lower urinary tract symptoms: Secondary | ICD-10-CM | POA: Diagnosis not present

## 2015-10-20 DIAGNOSIS — M48061 Spinal stenosis, lumbar region without neurogenic claudication: Secondary | ICD-10-CM | POA: Diagnosis not present

## 2015-10-20 DIAGNOSIS — K219 Gastro-esophageal reflux disease without esophagitis: Secondary | ICD-10-CM | POA: Diagnosis not present

## 2015-10-20 DIAGNOSIS — M5126 Other intervertebral disc displacement, lumbar region: Secondary | ICD-10-CM | POA: Diagnosis not present

## 2015-10-20 NOTE — Op Note (Signed)
NAME:  Dominic Hodges, Dominic Hodges            ACCOUNT NO.:  [id number]  MEDICAL RECORD NO.:  [id number]  LOCATION:  9476                         FACILITY:  [facility]  PHYSICIAN:  Kipp Brood. Logan Baltimore, M.D.DATE OF BIRTH:  Apr 06, 1927  DATE OF PROCEDURE:  10/19/2015 DATE OF DISCHARGE:                              OPERATIVE REPORT   SURGEON:  Kipp Brood. Gladstone Lighter, M.D.  ASSISTANT:  Ardeen Jourdain, [place]  PREOPERATIVE DIAGNOSES: 1. Severe spinal stenosis with a complete block at L3-4. 2. Foraminal stenosis for the L3 root on the right. 3. Foraminal stenosis for the L4 root on the right. 4. Foraminal stenosis involving the L3 root on the left. 5. Foraminal stenosis involving the L4 root on the left.  DESCRIPTION OF PROCEDURE:  Under general anesthesia, routine orthopedic prep and draping the lower back was carried out with the patient on his spinal frame.  At this time, great care was taken because of his prosthesis in his hip.  At this particular time, the appropriate time- out was carried out, I also marked the appropriate left side of his back, even though, we were going central.  Then, after the sterile prep and draping and time-out, two needles were placed in the back for localization purposes and x-ray was taken.  An incision then was made over the L3-4 interspace and expanded distally and proximally.  I then went down, separated the muscle from the lamina and spinous process bilaterally.  At this particular time, another x-ray was taken with Kocher clamps in place.  I then identified the L3 spinous process and then removed the entire spinous process with its small portion above and below.  I then went down and did a complete laminectomy centrally.  At this particular time, we brought the microscope in and gently identified the ligamentum flavum.  A cottonoid was placed between the dura and the ligamentum flavum and removed the ligamentum flavum, which was extremely thickened and tight.  I then went  out far laterally across the table to the left side and decompressed the lateral recess, which was extremely tight.  I also went up and decompressed the foramina as well for the L3 and L4 roots bilaterally.  Hockey stick then was easily applied distally and proximally.  We went down into the foramina as well.  We had complete decompression now.  Note, we stopped the decompression after we made sure that we were free proximally and distally.  Thoroughly irrigated out the wound, loosely applied some Ultrafoam, Gelfoam-type material loosely over the dura and the lateral gutters and then closed the wound layers in usual fashion except we left a small deep distal and proximal part of the wound open for drainage purposes.  We then at the beginning of the case, injected 20 mL of 0.5% plain Marcaine and at the end of the case, 20 mL of Exparel.  The wound then was closed with subcuticular suture after we closed the deep layers and then staples were used to close the skin.  Sterile dressings were applied.  He had 2 g of IV Ancef preop.          ______________________________ Kipp Brood. Gladstone Lighter, M.D.     RAG/MEDQ  D:  10/19/2015  T:  10/20/2015  Job:  [id number]

## 2015-10-20 NOTE — Evaluation (Signed)
Physical Therapy Evaluation Patient Details Name: Dominic Hodges MRN: [id number] DOB: 1927-12-18 Today's Date: 10/20/2015   History of Present Illness  s/p L3-4 decompression.  R hip fx in 11/16  Clinical Impression  Pt s/p back surgery and presents with functional mobility limitations 2* post op pain, back precautions and mild ambulatory instability largely corrected with use of RW.  Pt currently mobilizing at min guard/sup level and plans dc home this date with family assist.    Follow Up Recommendations No PT follow up    Equipment Recommendations  None recommended by PT    Recommendations for Other Services OT consult     Precautions / Restrictions Precautions Precautions: Back Precaution Booklet Issued: Yes (comment) Restrictions Weight Bearing Restrictions: No      Mobility  Bed Mobility Overal bed mobility: Needs Assistance Bed Mobility: Supine to Sit;Sit to Supine     Supine to sit: Min guard Sit to supine: Min guard   General bed mobility comments: cues for correct log roll technique.  completed x 3 to reinforce  Transfers Overall transfer level: Needs assistance Equipment used: Rolling walker (2 wheeled) Transfers: Sit to/from Stand Sit to Stand: Min guard;Supervision         General transfer comment: cues for safe transition position, use of UEs to self assist and adherence to back precautions  Ambulation/Gait Ambulation/Gait assistance: Min guard;Supervision Ambulation Distance (Feet): 400 Feet (and 30' to/from bathroom) Assistive device: Rolling walker (2 wheeled);Straight cane Gait Pattern/deviations: Step-through pattern;Decreased step length - right;Decreased step length - left;Shuffle;Trunk flexed Gait velocity: decr Gait velocity interpretation: Below normal speed for age/gender General Gait Details: 28' with cane and min guard for stability and 400' with RW and supervision with cues for posture and position from RW  Stairs Stairs:  Yes Stairs assistance: Min guard Stair Management: One rail Right;Step to pattern;Forwards;With cane Number of Stairs: 4 General stair comments: cues for sequence and to place feet fully on stair  Wheelchair Mobility    Modified Rankin (Stroke Patients Only)       Balance                                             Pertinent Vitals/Pain Pain Assessment: 0-10 Pain Score: 3  Faces Pain Scale: Hurts a little bit Pain Location: back Pain Descriptors / Indicators: Aching;Sore Pain Intervention(s): Limited activity within patient's tolerance;Monitored during session    [place] expects to be discharged to:: Private residence Living Arrangements: Spouse/significant other Available Help at Discharge: Family Type of Home: House Home Access: Stairs to enter Entrance Stairs-Rails: Psychiatric nurse of Steps: 3 Home Layout: Able to live on main level with bedroom/bathroom Home Equipment: Walker - 2 wheels;Cane - single point      Prior Function Level of Independence: Independent         Comments: Pt is a retired Engineer, mining        Extremity/Trunk Assessment   Upper Extremity Assessment: Overall WFL for tasks assessed           Lower Extremity Assessment: Generalized weakness      Cervical / Trunk Assessment: Kyphotic  Communication   Communication: HOH  Cognition Arousal/Alertness: Awake/alert Behavior During Therapy: WFL for tasks assessed/performed Overall Cognitive Status: Within Functional Limits for tasks assessed       Memory: Decreased short-term memory  General Comments      Exercises     Assessment/Plan    PT Assessment Patient needs continued PT services  PT Problem List Decreased strength;Decreased range of motion;Decreased activity tolerance;Decreased mobility;Pain;Decreased knowledge of use of DME          PT Treatment Interventions DME  instruction;Gait training;Stair training;Functional mobility training;Therapeutic activities;Therapeutic exercise;Patient/family education    PT Goals (Current goals can be found in the Care Plan section)  Acute Rehab PT Goals Patient Stated Goal: home PT Goal Formulation: All assessment and education complete, DC therapy    Frequency 7X/week   Barriers to discharge        Co-evaluation               End of Session Equipment Utilized During Treatment: Gait belt Activity Tolerance: Patient tolerated treatment well Patient left: in chair;with call bell/phone within reach;with chair alarm set Nurse Communication: Mobility status    Functional Assessment Tool Used: Clinical judgement Functional Limitation: Mobility: Walking and moving around Mobility: Walking and Moving Around Current Status (G2694): At least 20 percent but less than 40 percent impaired, limited or restricted Mobility: Walking and Moving Around Goal Status [phone]): At least 20 percent but less than 40 percent impaired, limited or restricted Mobility: Walking and Moving Around Discharge Status [phone]): At least 20 percent but less than 40 percent impaired, limited or restricted    Time: 0918-0943 PT Time Calculation (min) (ACUTE ONLY): 25 min   Charges:   PT Evaluation $PT Eval Low Complexity: 1 Procedure PT Treatments $Gait Training: 8-22 mins   PT G Codes:   PT G-Codes **NOT FOR INPATIENT CLASS** Functional Assessment Tool Used: Clinical judgement Functional Limitation: Mobility: Walking and moving around Mobility: Walking and Moving Around Current Status (K9381): At least 20 percent but less than 40 percent impaired, limited or restricted Mobility: Walking and Moving Around Goal Status [phone]): At least 20 percent but less than 40 percent impaired, limited or restricted Mobility: Walking and Moving Around Discharge Status [phone]): At least 20 percent but less than 40 percent impaired, limited or  restricted    Dominic Hodges 10/20/2015, 12:48 PM

## 2015-10-20 NOTE — Progress Notes (Signed)
All DC instructions reviewed with the pt with his wife and son at the bedside. All questions and concerns were addressed. Dressing change has been done for today and pt and wife taught how to do them at home. Pt VSS, pain is controlled, incision to lumbar spine is unremarkable, dsg c/d/i, all other skin is intact. No apparent s/s of distress or discomfort at this time. Pt DC home.

## 2015-10-20 NOTE — Progress Notes (Signed)
Subjective: 1 Day Post-Op Procedure(s) (LRB): CENTRAL DECOMPRESSION LUMBAR LAMINECTOMY FOR SPINAL STENOSIS, L3,L4FORAMINOTOMY BILATERAL L3,L4 NERVE ROOT (N/A) Patient reports pain as 1 on 0-10 scale. Doing well. Will DC after PT.   Objective: Vital signs in last 24 hours: Temp:  [97.5 F (36.4 C)-98.8 F (37.1 C)] 97.5 F (36.4 C) (10/12 0549) Pulse Rate:  [63-87] 63 (10/12 0549) Resp:  [14-21] 16 (10/12 0549) BP: (127-165)/(52-85) 127/55 (10/12 0549) SpO2:  [94 %-100 %] 96 % (10/12 0549)  Intake/Output from previous day: 10/11 0701 - 10/12 0700 In: 3499.5 [P.O.:552; I.V.:2847.5; IV Piggyback:100] Out: 1585 [Urine:1535; Blood:50] Intake/Output this shift: No intake/output data recorded.  No results for input(s): HGB in the last 72 hours. No results for input(s): WBC, RBC, HCT, PLT in the last 72 hours. No results for input(s): NA, K, CL, CO2, BUN, CREATININE, GLUCOSE, CALCIUM in the last 72 hours. No results for input(s): LABPT, INR in the last 72 hours.  Neurologically intact Dorsiflexion/Plantar flexion intact  Assessment/Plan: 1 Day Post-Op Procedure(s) (LRB): CENTRAL DECOMPRESSION LUMBAR LAMINECTOMY FOR SPINAL STENOSIS, L3,L4FORAMINOTOMY BILATERAL L3,L4 NERVE ROOT (N/A) Up with therapy Discharge home with home health  Bethanny Toelle A 10/20/2015, 7:17 AM

## 2015-10-20 NOTE — Evaluation (Signed)
Occupational Therapy Evaluation Patient Details Name: Dominic Hodges MRN: [id number] DOB: 1927-08-27 Today's Date: 10/20/2015    History of Present Illness s/p L3-4 decompression.  R hip fx in 11/16   Clinical Impression   Dr [name] was admitted for the above sx. All education was completed. No further OT is needed at this time    Follow Up Recommendations  No OT follow up;Supervision/Assistance - 24 hour    Equipment Recommendations  None recommended by OT    Recommendations for Other Services       Precautions / Restrictions Precautions Precautions: Back Restrictions Weight Bearing Restrictions: No      Mobility Bed Mobility                  Transfers                      Balance                                            ADL Overall ADL's : Needs assistance/impaired     Grooming: Oral care;Supervision/safety;Standing   Upper Body Bathing: Supervision/ safety;Standing   Lower Body Bathing: Minimal assistance;Sit to/from stand   Upper Body Dressing : Supervision/safety;Standing   Lower Body Dressing: Moderate assistance;Sit to/from stand   Toilet Transfer: Min guard;Ambulation;RW (chair)   Toileting- Clothing Manipulation and Hygiene: Supervision/safety;Sit to/from stand         General ADL Comments: pt completed adl and stood to use toilet.  Pt does not have a reacher, and he is pretty independent.  He said Dr [place] Lighter told him he didn't have to be as strict with back precautions as I instructed him.  Recommended he have help or use back scratcher to extend reach if he doesn';t have a reacher.  Also recommended elastic shoelaces to make shoes easier.  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     Vision     Perception     Praxis      Pertinent Vitals/Pain Pain Assessment: Faces Faces Pain Scale: Hurts a little bit Pain Location: back Pain Intervention(s): Limited activity within  patient's tolerance;Monitored during session;Repositioned     Hand Dominance     Extremity/Trunk Assessment Upper Extremity Assessment Upper Extremity Assessment: Overall WFL for tasks assessed           Communication Communication Communication: HOH   Cognition Arousal/Alertness: Awake/alert Behavior During Therapy: WFL for tasks assessed/performed  WFLs                     General Comments       Exercises       Shoulder Instructions      Home Living Family/patient expects to be discharged to:: Private residence Living Arrangements: Spouse/significant other Available Help at Discharge: Family               Bathroom Shower/Tub: Tub/shower unit Shower/tub characteristics: Curtain Biochemist, clinical: Handicapped height     Home Equipment: Cane - single point (long shoe horn and sock aide)          Prior Functioning/Environment Level of Independence: Independent        Comments: Pt is a retired Health and safety inspector Problem List:     OT Treatment/Interventions:  OT Goals(Current goals can be found in the care plan section) Acute Rehab OT Goals Patient Stated Goal: home  OT Frequency:     Barriers to D/C:            Co-evaluation              End of Session    Activity Tolerance: Patient tolerated treatment well Patient left: in chair;with call bell/phone within reach;with chair alarm set   Time: 2992-4268 OT Time Calculation (min): 48 min Charges:  OT General Charges $OT Visit: 1 Procedure OT Evaluation $OT Eval Low Complexity: 1 Procedure OT Treatments $Self Care/Home Management : 8-22 mins G-Codes: OT G-codes **NOT FOR INPATIENT CLASS** Functional Assessment Tool Used: clinical observation and judgment Functional Limitation: Self care Self Care Current Status (T4196): At least 20 percent but less than 40 percent impaired, limited or restricted Self Care Goal Status (Q2297): At least 20 percent but less than 40  percent impaired, limited or restricted Self Care Discharge Status [phone]): At least 20 percent but less than 40 percent impaired, limited or restricted  [facility] 10/20/2015, 9:19 AM  Lesle Chris, OTR/L [phone] 10/20/2015

## 2015-11-03 DIAGNOSIS — M4807 Spinal stenosis, lumbosacral region: Secondary | ICD-10-CM | POA: Diagnosis not present

## 2015-11-03 DIAGNOSIS — Z4789 Encounter for other orthopedic aftercare: Secondary | ICD-10-CM | POA: Diagnosis not present

## 2015-12-05 DIAGNOSIS — Z4789 Encounter for other orthopedic aftercare: Secondary | ICD-10-CM | POA: Diagnosis not present

## 2015-12-05 DIAGNOSIS — M4807 Spinal stenosis, lumbosacral region: Secondary | ICD-10-CM | POA: Diagnosis not present

## 2015-12-20 ENCOUNTER — Ambulatory Visit (HOSPITAL_COMMUNITY): Payer: Medicare Other | Attending: Orthopedic Surgery | Admitting: Physical Therapy

## 2015-12-20 ENCOUNTER — Ambulatory Visit (HOSPITAL_COMMUNITY): Payer: Medicare Other | Admitting: Physical Therapy

## 2015-12-20 DIAGNOSIS — M5116 Intervertebral disc disorders with radiculopathy, lumbar region: Secondary | ICD-10-CM | POA: Diagnosis not present

## 2015-12-20 DIAGNOSIS — W19XXXD Unspecified fall, subsequent encounter: Secondary | ICD-10-CM | POA: Diagnosis not present

## 2015-12-20 DIAGNOSIS — M6281 Muscle weakness (generalized): Secondary | ICD-10-CM

## 2015-12-20 NOTE — Therapy (Signed)
[facility] [address] [place], [place], [address] Phone: [phone]   Fax:  [phone]  Physical Therapy Evaluation  Patient Details  Name: Dominic Hodges MRN: [id number] Date of Birth: [birth year] Referring Provider: Latanya Maudlin  Encounter Date: 12/20/2015      PT End of Session - 12/20/15 1217    Visit Number 1   Number of Visits 16   Date for PT Re-Evaluation 01/19/16   Authorization Type Medicare   Authorization - Visit Number 1   Authorization - Number of Visits 10   PT Start Time 0905   PT Stop Time 0950   PT Time Calculation (min) 45 min   Activity Tolerance Patient limited by pain   Behavior During Therapy [facility] for tasks assessed/performed      Past Medical History:  Diagnosis Date  . Acid reflux    mild-states if doesnt chew food completely he begins to cough- states has weakness in muscles due to ilieus- has been doing exreci this  . Adynamic ileus ([place]) 12/2014   ? anesthesia related per pt  . BPH (benign prostatic hyperplasia)   . Complication of anesthesia    10/11/15- per pt "had [place]- in 11/16-related it to possibly anesthesia"  . H/O seasonal allergies   . Hyperlipidemia   . Hypertension     Past Surgical History:  Procedure Laterality Date  . APPENDECTOMY     approx 30 years ago/lwb  . CATARACT EXTRACTION W/PHACO Left 09/11/2012   Procedure: CATARACT EXTRACTION PHACO AND INTRAOCULAR LENS PLACEMENT (IOC);  Surgeon: Tonny Branch, MD;  Location: [facility];  Service: Ophthalmology;  Laterality: Left;  CDE: 19.52  . HIP ARTHROPLASTY Right 12/06/2014   Procedure: PARTIAL RIGHT HIP REPLACEMENT;  Surgeon: Carole Civil, MD;  Location: [facility];  Service: Orthopedics;  Laterality: Right;  . LUMBAR LAMINECTOMY/DECOMPRESSION MICRODISCECTOMY N/A 10/19/2015   Procedure: CENTRAL DECOMPRESSION LUMBAR LAMINECTOMY FOR SPINAL STENOSIS, L3,L4FORAMINOTOMY BILATERAL L3,L4 NERVE ROOT;   Surgeon: Latanya Maudlin, MD;  Location: [facility];  Service: Orthopedics;  Laterality: N/A;  . UMBILICAL HERNIA REPAIR     Approx 10 years ago/lwb  . ureteral stone extraction      There were no vitals filed for this visit.       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  He did alright but resulted in  one leg being  shorter than the other.  He then had a second fall in January in this year and had continued back pain causing him to retire.  In October of this year he opted to have a L3-4 decompressive surgery in October of this year and is still having pain.  His pain is mainly on his left side and goes down to the back of his mid thigh level.  He has had a recent fall over this weekend and is very sore.    Pertinent History Multiple falls, Rt hip fracture, 3 epidural injections, decompressive laminectomy    Limitations Lifting;Standing;Walking;House hold activities   How long can you sit comfortably? increased pain after 10 minutes but can sit for 45 minues    How long can you stand comfortably? able to stand for 15 minues    How long can you walk comfortably? walks with a cane short distances has not gotten back to walking    Patient Stated Goals to have less falls, to have  less pain, To be able to walk better; walk inside without his cane    Currently in Pain? Yes   Pain Score --  worst pain goes to a 6/10    Pain Location Back   Pain Orientation Left   Pain Descriptors / Indicators Aching;Sore   Pain Type Chronic pain   Pain Radiating Towards Lt posterior thigh   Pain Onset More than a month ago   Pain Frequency Constant   Aggravating Factors  activity   Pain Relieving Factors lying down    Effect of Pain on Daily Activities increases             [facility] PT Assessment - 12/20/15 0001      Assessment   Medical Diagnosis Back pain following laminectomy   Referring Provider Latanya Maudlin   Onset  Date/Surgical Date 10/19/15   Next MD Visit 01/13/2016   Prior Therapy not since the surgery      Precautions   Precautions Fall     Restrictions   Weight Bearing Restrictions No     Balance Screen   Has the patient fallen in the past 6 months Yes   How many times? 4   Has the patient had a decrease in activity level because of a fear of falling?  Yes   Is the patient reluctant to leave their home because of a fear of falling?  No     Home Environment   Living Environment Private residence   Home Access Stairs to enter   Entrance Stairs-Number of Steps 3  one step at a time      Prior Function   Level of [place] Retired   Leisure use to Agricultural consultant   Overall Cognitive Status Within Functional Limits for tasks assessed     Posture/Postural Control   Posture/Postural Control Postural limitations   Postural Limitations Rounded Shoulders;Forward head;Decreased lumbar lordosis;Increased thoracic kyphosis;Flexed trunk     ROM / Strength   AROM / PROM / Strength Strength     Strength   Strength Assessment Site Hip;Knee;Ankle   Right/Left Hip Right;Left   Right Hip Extension 2/5   Right Hip ABduction 2/5   Left Hip Extension 2/5   Left Hip ABduction 2/5   Right/Left Knee Right;Left   Right Knee Flexion 5/5   Left Knee Extension 5/5   Right/Left Ankle Right;Left   Right Ankle Dorsiflexion 4/5   Left Ankle Dorsiflexion 4/5     Flexibility   Soft Tissue Assessment /Muscle Length yes   Hamstrings Rt 145; Lt 140                   [facility] Adult PT Treatment/Exercise - 12/20/15 0001      Exercises   Exercises Lumbar     Lumbar Exercises: Supine   Ab Set 10 reps   Other Supine Lumbar Exercises decompression exercises 1-5                 PT Education - 12/20/15 1216    Education provided Yes   Education Details Pt given abdominal isometric and decompression exercises 1-5    Person(s) Educated Patient   Methods  Explanation;Handout;Verbal cues;Tactile cues   Comprehension Verbalized understanding          PT Short Term Goals - 12/20/15 1224      PT SHORT TERM GOAL #1   Title Pt low back pain to be no greater than a  4/10 to allow pt to be walking for 15 minutes at a time for his back.   Time 4   Period Weeks   Status New     PT SHORT TERM GOAL #2   Title Pt to be able to single leg stance on both LE for 10 seconds to reduce risk of falls    Time 4   Period Weeks   Status New     PT SHORT TERM GOAL #3   Title Pt core and leg strength to improve to allow pt to come from sit to stand without difficulty    Time 4   Period Weeks   Status New           PT Long Term Goals - 12/20/15 1226      PT LONG TERM GOAL #1   Title Pt low back pain to be no greater than a 2/10 to allow pt to be walking for 30 minutes without pain    Time 8   Period Weeks   Status New     PT LONG TERM GOAL #2   Title Pt Single leg stance to be at least 20 on both legs to allow pt to be able to ambulate without the use of a cane.    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  He states that he fell again this weekend and is very sore therefore the evaluation was limited.  Dr. [name] has been referred to decrease his back pain and improve his stability.  His referral was for four weeks but the therapist feels that with the issues at hand eight weeks  will be needed to obtain the patient's goals.  Examination demonstrates decreased balance, decreased strength, decreased activity tolerance, postural changes and increased pain.  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     Rehab Potential Good   PT Frequency 2x / week   PT Duration 8 weeks   PT Treatment/Interventions ADLs/Self Care Home Management;Functional mobility training;Therapeutic activities;Therapeutic exercise;Balance training;Neuromuscular re-education;Patient/family education;Manual techniques   PT Next Visit Plan begin postrual and body mechanic education with sheet for ADL's, begin t-band decompression exercises and progress lumbar stabilization.     PT Home Exercise Plan decompression 1-5; ab set.       Patient will benefit from skilled therapeutic intervention in order to improve the following deficits and impairments:  Decreased activity tolerance, Decreased balance, Decreased mobility, Decreased range of motion, Decreased strength, Difficulty walking, Postural dysfunction, Pain, Increased muscle spasms  Visit Diagnosis: Intervertebral disc disorder with radiculopathy of lumbar region  Muscle weakness (generalized)  Fall, subsequent encounter      G-Codes - 01/18/2016 04-30-27    Functional Assessment Tool Used Clinical judgement:  how long pt is able to walk    Functional Limitation Mobility: Walking and moving around   Mobility: Walking and Moving Around Current Status [phone]) At least 40 percent but less than 60 percent impaired, limited or restricted   Mobility: Walking and Moving Around Goal Status [phone]) At least 20 percent but less than 40 percent impaired, limited or restricted       Problem List Patient Active Problem List   Diagnosis Date Noted  . Spinal stenosis, lumbar region, with neurogenic claudication 10/19/2015  . Closed right hip fracture  ([place]) 12/05/2014  . Closed hip fracture ([place]) 12/05/2014  . Hypertension 12/05/2014  . BPH (benign prostatic hyperplasia) 12/05/2014  . Hyperlipidemia 12/05/2014  . Essential hypertension, benign 06/01/2010  . NUMBNESS/TINGLING 11/20/2006  . BACK PAIN 11/20/2006    Rayetta Humphrey, PT CLT [phone] January 18, 2016, 12:38 PM  [facility] [address] [place], [place], [address] Phone: [phone]   Fax:  [phone]  Name: Alric Geise MRN: [id number] Date of Birth: 1927-03-31

## 2015-12-22 ENCOUNTER — Ambulatory Visit (HOSPITAL_COMMUNITY): Payer: Medicare Other | Admitting: Physical Therapy

## 2015-12-22 DIAGNOSIS — M6281 Muscle weakness (generalized): Secondary | ICD-10-CM

## 2015-12-22 DIAGNOSIS — M5116 Intervertebral disc disorders with radiculopathy, lumbar region: Secondary | ICD-10-CM | POA: Diagnosis not present

## 2015-12-22 NOTE — Therapy (Signed)
[facility] [address] [place], [place], [address] Phone: [phone]   Fax:  [phone]  Physical Therapy Treatment  Patient Details  Name: Dominic Hodges MRN: [id number] Date of Birth: 12/03/1927 Referring Provider: Latanya Maudlin  Encounter Date: 12/22/2015      PT End of Session - 12/22/15 1029    Visit Number 2   Number of Visits 16   Date for PT Re-Evaluation 01/19/16   Authorization Type Medicare   Authorization - Visit Number 2   Authorization - Number of Visits 10   PT Start Time [phone]   PT Stop Time 1028   PT Time Calculation (min) 41 min   Activity Tolerance Patient tolerated treatment well   Behavior During Therapy [facility] for tasks assessed/performed      Past Medical History:  Diagnosis Date  . Acid reflux    mild-states if doesnt chew food completely he begins to cough- states has weakness in muscles due to ilieus- has been doing exreci this  . Adynamic ileus ([place]) 12/2014   ? anesthesia related per pt  . BPH (benign prostatic hyperplasia)   . Complication of anesthesia    10/11/15- per pt "had [place]- in 11/16-related it to possibly anesthesia"  . H/O seasonal allergies   . Hyperlipidemia   . Hypertension     Past Surgical History:  Procedure Laterality Date  . APPENDECTOMY     approx 30 years ago/lwb  . CATARACT EXTRACTION W/PHACO Left 09/11/2012   Procedure: CATARACT EXTRACTION PHACO AND INTRAOCULAR LENS PLACEMENT (IOC);  Surgeon: Tonny Branch, MD;  Location: [facility];  Service: Ophthalmology;  Laterality: Left;  CDE: 19.52  . HIP ARTHROPLASTY Right 12/06/2014   Procedure: PARTIAL RIGHT HIP REPLACEMENT;  Surgeon: Carole Civil, MD;  Location: [facility];  Service: Orthopedics;  Laterality: Right;  . LUMBAR LAMINECTOMY/DECOMPRESSION MICRODISCECTOMY N/A 10/19/2015   Procedure: CENTRAL DECOMPRESSION LUMBAR LAMINECTOMY FOR SPINAL STENOSIS, L3,L4FORAMINOTOMY BILATERAL L3,L4 NERVE  ROOT;  Surgeon: Latanya Maudlin, MD;  Location: [facility];  Service: Orthopedics;  Laterality: N/A;  . UMBILICAL HERNIA REPAIR     Approx 10 years ago/lwb  . ureteral stone extraction      There were no vitals filed for this visit.      Subjective Assessment - 12/22/15 0951    Subjective Patient arrives stating he continues to have pain in his L back and L hip specifically. Most of his troubles are towards the end of the day. He slipped a little bit cleaning off his car in the snow the other day, landed on his R side but was not injured.    Pertinent History Multiple falls, Rt hip fracture, 3 epidural injections, decompressive laminectomy    Patient Stated Goals to have less falls, to have less pain, To be able to walk better; walk inside without his cane    Currently in Pain? No/denies  pain later the day                          [facility] Adult PT Treatment/Exercise - 12/22/15 0001      Therapeutic Activites    Therapeutic Activities Other Therapeutic Activities   Other Therapeutic Activities bed mobility      Lumbar Exercises: Seated   Hip Flexion on Ball Limitations seated dead bugs 1x15; seated marches on air pad 1x120  scap/cerv retractions 1x15, backwards shoulder rolls 1x15,      Lumbar Exercises:  Supine   Ab Set 15 reps   AB Set Limitations 3 second holds    Clam 10 reps   Clam Limitations red TB    Bent Knee Raise 10 reps   Bent Knee Raise Limitations core set    Bridge 15 reps   Isometric Hip Flexion Limitations     Other Supine Lumbar Exercises dead bugs 1x15                PT Education - 12/22/15 1029    Education provided Yes   Education Details initial eval and goals    Person(s) Educated Patient   Methods Explanation;Handout   Comprehension Verbalized understanding          PT Short Term Goals - 12/20/15 1224      PT SHORT TERM GOAL #1   Title Pt low back pain to be no greater than a 4/10 to allow pt to be walking for 15 minutes  at a time for his back.   Time 4   Period Weeks   Status New     PT SHORT TERM GOAL #2   Title Pt to be able to single leg stance on both LE for 10 seconds to reduce risk of falls    Time 4   Period Weeks   Status New     PT SHORT TERM GOAL #3   Title Pt core and leg strength to improve to allow pt to come from sit to stand without difficulty    Time 4   Period Weeks   Status New           PT Long Term Goals - 12/20/15 1226      PT LONG TERM GOAL #1   Title Pt low back pain to be no greater than a 2/10 to allow pt to be walking for 30 minutes without pain    Time 8   Period Weeks   Status New     PT LONG TERM GOAL #2   Title Pt Single leg stance to be at least 20 on both legs to allow pt to be able to ambulate without the use of a cane.    Time 8   Period Weeks   Status New     PT LONG TERM GOAL #3   Title Pt core and leg strength to be increased to allow pt to be able to go up and down one flight of steps in a reciprocal manner without increased pain    Time 8   Period Weeks     PT LONG TERM GOAL #4   Title Pt to report that he has not had any falls in the past four weeks    Time 8   Period Weeks               Plan - 12/22/15 1030    Clinical Impression Statement Session focus on lumbar stabilization techniques as noted in POC, especially since patient appear to have quite a bit of difficulty in effectively activating core musculature at this time. Verbal and tactile cues provided as needed for form this session. Reviewed initial evaluation and goals at EOS after completing quick disclosure. Discussed and attempted bed mobility with correct mechanics and form however patient does continue to require correction on this front.    Rehab Potential Good   PT Frequency 2x / week   PT Duration 8 weeks   PT Treatment/Interventions ADLs/Self Care Home Management;Functional mobility training;Therapeutic activities;Therapeutic exercise;Balance training;Neuromuscular  re-education;Patient/family education;Manual techniques   PT Next Visit Plan begin postrual and Arts development officer education with sheet for ADL's, begin t-band decompression exercises and progress lumbar stabilization.  Balance.    PT Home Exercise Plan decompression 1-5; ab set.    Consulted and Agree with Plan of Care Patient      Patient will benefit from skilled therapeutic intervention in order to improve the following deficits and impairments:  Decreased activity tolerance, Decreased balance, Decreased mobility, Decreased range of motion, Decreased strength, Difficulty walking, Postural dysfunction, Pain, Increased muscle spasms  Visit Diagnosis: Intervertebral disc disorder with radiculopathy of lumbar region  Muscle weakness (generalized)     Problem List Patient Active Problem List   Diagnosis Date Noted  . Spinal stenosis, lumbar region, with neurogenic claudication 10/19/2015  . Closed right hip fracture ([place]) 12/05/2014  . Closed hip fracture ([place]) 12/05/2014  . Hypertension 12/05/2014  . BPH (benign prostatic hyperplasia) 12/05/2014  . Hyperlipidemia 12/05/2014  . Essential hypertension, benign 06/01/2010  . NUMBNESS/TINGLING 11/20/2006  . BACK PAIN 11/20/2006    Deniece Ree PT, DPT [facility] [address] [place], [place], [address] Phone: [phone]   Fax:  [phone]  Name: Dominic Hodges MRN: [id number] Date of Birth: [birth year]

## 2015-12-27 ENCOUNTER — Ambulatory Visit (HOSPITAL_COMMUNITY): Payer: Medicare Other

## 2015-12-27 DIAGNOSIS — M5116 Intervertebral disc disorders with radiculopathy, lumbar region: Secondary | ICD-10-CM | POA: Diagnosis not present

## 2015-12-27 DIAGNOSIS — M6281 Muscle weakness (generalized): Secondary | ICD-10-CM

## 2015-12-27 DIAGNOSIS — W19XXXD Unspecified fall, subsequent encounter: Secondary | ICD-10-CM

## 2015-12-27 NOTE — Therapy (Signed)
[facility] [address] [place], [place], [address] Phone: [phone]   Fax:  [phone]  Physical Therapy Treatment  Patient Details  Name: Dominic Hodges MRN: [id number] Date of Birth: 01/11/1927 Referring Provider: Latanya Maudlin  Encounter Date: 12/27/2015      PT End of Session - 12/27/15 1004    Visit Number 3   Number of Visits 16   Date for PT Re-Evaluation 01/19/16   Authorization Type Medicare   Authorization - Visit Number 3   Authorization - Number of Visits 10   PT Start Time 8299   PT Stop Time 1034   PT Time Calculation (min) 39 min   Activity Tolerance Patient tolerated treatment well   Behavior During Therapy [facility] for tasks assessed/performed      Past Medical History:  Diagnosis Date  . Acid reflux    mild-states if doesnt chew food completely he begins to cough- states has weakness in muscles due to ilieus- has been doing exreci this  . Adynamic ileus ([place]) 12/2014   ? anesthesia related per pt  . BPH (benign prostatic hyperplasia)   . Complication of anesthesia    10/11/15- per pt "had [place]- in 11/16-related it to possibly anesthesia"  . H/O seasonal allergies   . Hyperlipidemia   . Hypertension     Past Surgical History:  Procedure Laterality Date  . APPENDECTOMY     approx 30 years ago/lwb  . CATARACT EXTRACTION W/PHACO Left 09/11/2012   Procedure: CATARACT EXTRACTION PHACO AND INTRAOCULAR LENS PLACEMENT (IOC);  Surgeon: Tonny Branch, MD;  Location: [facility];  Service: Ophthalmology;  Laterality: Left;  CDE: 19.52  . HIP ARTHROPLASTY Right 12/06/2014   Procedure: PARTIAL RIGHT HIP REPLACEMENT;  Surgeon: Carole Civil, MD;  Location: [facility];  Service: Orthopedics;  Laterality: Right;  . LUMBAR LAMINECTOMY/DECOMPRESSION MICRODISCECTOMY N/A 10/19/2015   Procedure: CENTRAL DECOMPRESSION LUMBAR LAMINECTOMY FOR SPINAL STENOSIS, L3,L4FORAMINOTOMY BILATERAL L3,L4 NERVE  ROOT;  Surgeon: Latanya Maudlin, MD;  Location: [facility];  Service: Orthopedics;  Laterality: N/A;  . UMBILICAL HERNIA REPAIR     Approx 10 years ago/lwb  . ureteral stone extraction      There were no vitals filed for this visit.      Subjective Assessment - 12/27/15 0957    Subjective pt stated current pain scale 2/10 Lt lower back and Lt hip pain    Pertinent History Multiple falls, Rt hip fracture, 3 epidural injections, decompressive laminectomy    Patient Stated Goals to have less falls, to have less pain, To be able to walk better; walk inside without his cane    Currently in Pain? Yes   Pain Score 2    Pain Location Back   Pain Orientation Left;Lower   Pain Descriptors / Indicators Sore;Aching   Pain Type Chronic pain   Pain Radiating Towards Lt posterior thigh   Pain Onset More than a month ago   Pain Frequency Constant   Aggravating Factors  activity   Pain Relieving Factors lying down   Effect of Pain on Daily Activities increases                         [facility] Adult PT Treatment/Exercise - 12/27/15 0001      Bed Mobility   Bed Mobility Sit to Sidelying Left   Sit to Sidelying Left 5: Supervision   Sit to Sidelying Left Details (indicate cue type  and reason) Instructed proper bed mobility      Lumbar Exercises: Seated   Hip Flexion on Ball Limitations seated rows with RTB 15x     Lumbar Exercises: Supine   Ab Set 15 reps   AB Set Limitations 3 second holds with verbal and tactile cuieng   Bent Knee Raise 15 reps;3 seconds   Bent Knee Raise Limitations Dead bug   Bridge 20 reps   Bridge Limitations 2 sets x 10 reps   Other Supine Lumbar Exercises Decompression with theraband RTB 1-4                  PT Short Term Goals - 12/20/15 1224      PT SHORT TERM GOAL #1   Title Pt low back pain to be no greater than a 4/10 to allow pt to be walking for 15 minutes at a time for his back.   Time 4   Period Weeks   Status New     PT SHORT  TERM GOAL #2   Title Pt to be able to single leg stance on both LE for 10 seconds to reduce risk of falls    Time 4   Period Weeks   Status New     PT SHORT TERM GOAL #3   Title Pt core and leg strength to improve to allow pt to come from sit to stand without difficulty    Time 4   Period Weeks   Status New           PT Long Term Goals - 12/20/15 1226      PT LONG TERM GOAL #1   Title Pt low back pain to be no greater than a 2/10 to allow pt to be walking for 30 minutes without pain    Time 8   Period Weeks   Status New     PT LONG TERM GOAL #2   Title Pt Single leg stance to be at least 20 on both legs to allow pt to be able to ambulate without the use of a cane.    Time 8   Period Weeks   Status New     PT LONG TERM GOAL #3   Title Pt core and leg strength to be increased to allow pt to be able to go up and down one flight of steps in a reciprocal manner without increased pain    Time 8   Period Weeks     PT LONG TERM GOAL #4   Title Pt to report that he has not had any falls in the past four weeks    Time 8   Period Weeks               Plan - 12/27/15 1843    Clinical Impression Statement Session focus on improving lumbar stabilization.  Added decompression with theraband this session to improve lumbar stability.  Therapist facilitation for correct form and technique thorugh session.  No reoprts of increased pain.  Pt was educated on importance of proper bed mobility, moderate cueing to improve form and techqiue.  Ended session with education on importance of posture for back pain cotnrol, added theraband rows for strengthening.      Rehab Potential Good   PT Frequency 2x / week   PT Duration 8 weeks   PT Treatment/Interventions ADLs/Self Care Home Management;Functional mobility training;Therapeutic activities;Therapeutic exercise;Balance training;Neuromuscular re-education;Patient/family education;Manual techniques   PT Next Visit Plan begin postrual and  body Dealer  education with sheet for ADL's, progress lumbar stabilization.  Balance.    PT Home Exercise Plan decompression 1-5; ab set.       Patient will benefit from skilled therapeutic intervention in order to improve the following deficits and impairments:  Decreased activity tolerance, Decreased balance, Decreased mobility, Decreased range of motion, Decreased strength, Difficulty walking, Postural dysfunction, Pain, Increased muscle spasms  Visit Diagnosis: Intervertebral disc disorder with radiculopathy of lumbar region  Muscle weakness (generalized)  Fall, subsequent encounter     Problem List Patient Active Problem List   Diagnosis Date Noted  . Spinal stenosis, lumbar region, with neurogenic claudication 10/19/2015  . Closed right hip fracture ([place]) 12/05/2014  . Closed hip fracture ([place]) 12/05/2014  . Hypertension 12/05/2014  . BPH (benign prostatic hyperplasia) 12/05/2014  . Hyperlipidemia 12/05/2014  . Essential hypertension, benign 06/01/2010  . NUMBNESS/TINGLING 11/20/2006  . BACK PAIN 11/20/2006   Ihor Austin, [place]; [place]  Aldona Lento 12/27/2015, 6:51 PM  [facility] [address] [place], [place], [address] Phone: [phone]   Fax:  [phone]  Name: Dominic Hodges MRN: [id number] Date of Birth: June 08, 1927

## 2015-12-29 ENCOUNTER — Ambulatory Visit (HOSPITAL_COMMUNITY): Payer: Medicare Other

## 2015-12-29 DIAGNOSIS — M5116 Intervertebral disc disorders with radiculopathy, lumbar region: Secondary | ICD-10-CM | POA: Diagnosis not present

## 2015-12-29 DIAGNOSIS — M6281 Muscle weakness (generalized): Secondary | ICD-10-CM | POA: Diagnosis not present

## 2015-12-29 DIAGNOSIS — W19XXXD Unspecified fall, subsequent encounter: Secondary | ICD-10-CM

## 2015-12-29 NOTE — Patient Instructions (Addendum)
Bridging    Slowly raise buttocks from floor, keeping stomach tight. Repeat 10 times per set. Do 2 sets per session. Do 2 sessions per day.  http://orth.exer.us/1096   Copyright  VHI. All rights reserved.   Bent Leg Lift (Hook-Lying)    Tighten stomach and slowly raise right leg 10 inches from floor. Keep trunk rigid. Hold 5 seconds. Repeat 10 times per set. Do 2 sets per session. Do 1-2 sessions per day.  http://orth.exer.us/1090   Copyright  VHI. All rights reserved.   Hamstring Step 3    Left leg in maximal straight leg raise, heel at maximal stretch, straighten knee further by tightening knee cap. Warning: Intense stretch. Stay within tolerance. Hold 30 seconds. Relax knee cap only. Repeat 3 times.  Copyright  VHI. All rights reserved.

## 2015-12-29 NOTE — Therapy (Signed)
[facility] [address] [place], [place], [address] Phone: [phone]   Fax:  [phone]  Physical Therapy Treatment  Patient Details  Name: Dominic Hodges MRN: [id number] Date of Birth: [birth year] Referring Provider: Latanya Maudlin  Encounter Date: 12/29/2015      PT End of Session - 12/29/15 1443    Visit Number 4   Number of Visits 16   Date for PT Re-Evaluation 01/19/16   Authorization Type Medicare   Authorization - Visit Number 4   Authorization - Number of Visits 10   PT Start Time 2423   PT Stop Time 1517   PT Time Calculation (min) 42 min   Activity Tolerance Patient tolerated treatment well   Behavior During Therapy [facility] for tasks assessed/performed      Past Medical History:  Diagnosis Date  . Acid reflux    mild-states if doesnt chew food completely he begins to cough- states has weakness in muscles due to ilieus- has been doing exreci this  . Adynamic ileus ([place]) 12/2014   ? anesthesia related per pt  . BPH (benign prostatic hyperplasia)   . Complication of anesthesia    10/11/15- per pt "had [place]- in 11/16-related it to possibly anesthesia"  . H/O seasonal allergies   . Hyperlipidemia   . Hypertension     Past Surgical History:  Procedure Laterality Date  . APPENDECTOMY     approx 30 years ago/lwb  . CATARACT EXTRACTION W/PHACO Left 09/11/2012   Procedure: CATARACT EXTRACTION PHACO AND INTRAOCULAR LENS PLACEMENT (IOC);  Surgeon: Tonny Branch, MD;  Location: [facility];  Service: Ophthalmology;  Laterality: Left;  CDE: 19.52  . HIP ARTHROPLASTY Right 12/06/2014   Procedure: PARTIAL RIGHT HIP REPLACEMENT;  Surgeon: Carole Civil, MD;  Location: [facility];  Service: Orthopedics;  Laterality: Right;  . LUMBAR LAMINECTOMY/DECOMPRESSION MICRODISCECTOMY N/A 10/19/2015   Procedure: CENTRAL DECOMPRESSION LUMBAR LAMINECTOMY FOR SPINAL STENOSIS, L3,L4FORAMINOTOMY BILATERAL L3,L4 NERVE  ROOT;  Surgeon: Latanya Maudlin, MD;  Location: [facility];  Service: Orthopedics;  Laterality: N/A;  . UMBILICAL HERNIA REPAIR     Approx 10 years ago/lwb  . ureteral stone extraction      There were no vitals filed for this visit.      Subjective Assessment - 12/29/15 1440    Subjective Pt stated current pain scale 2/10 Lt lower back and Lt hip going midpoint down lateral aspect of thigh.     Pertinent History Multiple falls, Rt hip fracture, 3 epidural injections, decompressive laminectomy    Patient Stated Goals to have less falls, to have less pain, To be able to walk better; walk inside without his cane    Currently in Pain? Yes   Pain Score 2    Pain Location Back  Lt LB and Lt proximal lateral LE   Pain Orientation Left;Lower;Lateral;Proximal   Pain Descriptors / Indicators Dull;Aching   Pain Type Chronic pain   Pain Radiating Towards Lt posterior thigh   Pain Onset More than a month ago   Pain Frequency Constant   Aggravating Factors  activity   Pain Relieving Factors lying down   Effect of Pain on Daily Activities increases          [facility] Adult PT Treatment/Exercise - 12/29/15 0001      Bed Mobility   Bed Mobility Sit to Sidelying Left   Sit to Sidelying Left 5: Supervision   Sit to Sidelying Left Details (indicate cue type  and reason) Instructed proper bed mobility, continues to require cueing for proper mechanics     Posture/Postural Control   Posture/Postural Control Postural limitations   Postural Limitations Rounded Shoulders;Forward head;Decreased lumbar lordosis;Increased thoracic kyphosis;Flexed trunk     Lumbar Exercises: Stretches   Active Hamstring Stretch 3 reps;30 seconds   Active Hamstring Stretch Limitations supine with rope     Lumbar Exercises: Seated   Hip Flexion on Ball Limitations seated rows with RTB 15x     Lumbar Exercises: Supine   Ab Set 15 reps   AB Set Limitations 3 second holds with verbal and tactile cuieng   Clam 10 reps   Clam  Limitations red TB    Bent Knee Raise 15 reps;3 seconds   Bent Knee Raise Limitations Dead bug   Bridge 20 reps   Bridge Limitations 2 sets x 10 reps   Straight Leg Raise 10 reps   Straight Leg Raises Limitations cueing for knee extension   Other Supine Lumbar Exercises SAQ 15x                PT Education - 12/29/15 1805    Education provided Yes   Education Details Reviewed body mechanics for bed; Educated importance of compliance iwht HEP with additional exercises   Person(s) Educated Patient   Methods Explanation;Demonstration;Handout   Comprehension Verbalized understanding;Returned demonstration;Need further instruction          PT Short Term Goals - 12/20/15 1224      PT SHORT TERM GOAL #1   Title Pt low back pain to be no greater than a 4/10 to allow pt to be walking for 15 minutes at a time for his back.   Time 4   Period Weeks   Status New     PT SHORT TERM GOAL #2   Title Pt to be able to single leg stance on both LE for 10 seconds to reduce risk of falls    Time 4   Period Weeks   Status New     PT SHORT TERM GOAL #3   Title Pt core and leg strength to improve to allow pt to come from sit to stand without difficulty    Time 4   Period Weeks   Status New           PT Long Term Goals - 12/20/15 1226      PT LONG TERM GOAL #1   Title Pt low back pain to be no greater than a 2/10 to allow pt to be walking for 30 minutes without pain    Time 8   Period Weeks   Status New     PT LONG TERM GOAL #2   Title Pt Single leg stance to be at least 20 on both legs to allow pt to be able to ambulate without the use of a cane.    Time 8   Period Weeks   Status New     PT LONG TERM GOAL #3   Title Pt core and leg strength to be increased to allow pt to be able to go up and down one flight of steps in a reciprocal manner without increased pain    Time 8   Period Weeks     PT LONG TERM GOAL #4   Title Pt to report that he has not had any falls in the  past four weeks    Time 8   Period Weeks  Plan - 12/29/15 1757    Clinical Impression Statement Pt required multimodal cueing and explanation to improve body mechanics getting in and out of bed.  Session focus on improving lumbar stabilitation.  Added SLR with noted extension lagging BLE.  Pt educated on importance of knee extension with gait and hamstring tightness can affect lower back pain.  Added quad strengthening exercises and continued with hamstring stretches.  Reviewed compliance with HEP with additional exercises given for stability and to improve hamstring lengthening.  Pt educated on importance of compliance with HEP and encouraged to increase for maximal benefits.  No reports of pain through session.     Rehab Potential Good   PT Frequency 2x / week   PT Duration 8 weeks   PT Treatment/Interventions ADLs/Self Care Home Management;Functional mobility training;Therapeutic activities;Therapeutic exercise;Balance training;Neuromuscular re-education;Patient/family education;Manual techniques   PT Next Visit Plan begin postrual and body mechanic education with sheet for ADL's, progress lumbar stabilization.  Balance.    PT Home Exercise Plan decompression 1-5; ab set; bridge, bent knee raise and hamstring stretch      Patient will benefit from skilled therapeutic intervention in order to improve the following deficits and impairments:  Decreased activity tolerance, Decreased balance, Decreased mobility, Decreased range of motion, Decreased strength, Difficulty walking, Postural dysfunction, Pain, Increased muscle spasms  Visit Diagnosis: Intervertebral disc disorder with radiculopathy of lumbar region  Muscle weakness (generalized)  Fall, subsequent encounter     Problem List Patient Active Problem List   Diagnosis Date Noted  . Spinal stenosis, lumbar region, with neurogenic claudication 10/19/2015  . Closed right hip fracture ([place]) 12/05/2014  . Closed  hip fracture ([place]) 12/05/2014  . Hypertension 12/05/2014  . BPH (benign prostatic hyperplasia) 12/05/2014  . Hyperlipidemia 12/05/2014  . Essential hypertension, benign 06/01/2010  . NUMBNESS/TINGLING 11/20/2006  . BACK PAIN 11/20/2006   Ihor Austin, [place]; [place]  Aldona Lento 12/29/2015, 6:06 PM  [facility] [place], [place], [address] Phone: [phone]   Fax:  [phone]  Name: Dominic Hodges MRN: [id number] Date of Birth: 10-22-1927

## 2016-01-03 ENCOUNTER — Ambulatory Visit (HOSPITAL_COMMUNITY): Payer: Medicare Other | Admitting: Physical Therapy

## 2016-01-03 DIAGNOSIS — M5116 Intervertebral disc disorders with radiculopathy, lumbar region: Secondary | ICD-10-CM

## 2016-01-03 DIAGNOSIS — M6281 Muscle weakness (generalized): Secondary | ICD-10-CM

## 2016-01-03 DIAGNOSIS — W19XXXD Unspecified fall, subsequent encounter: Secondary | ICD-10-CM

## 2016-01-03 NOTE — Therapy (Signed)
[facility] [address] [place], [place], [address] Phone: [phone]   Fax:  [phone]  Physical Therapy Treatment  Patient Details  Name: Dominic Hodges MRN: [id number] Date of Birth: [birth year] Referring Provider: Latanya Maudlin  Encounter Date: 01/03/2016      PT End of Session - 01/03/16 0941    Visit Number 5   Number of Visits 16   Date for PT Re-Evaluation 01/19/16   Authorization Type Medicare   Authorization - Visit Number 5   Authorization - Number of Visits 10   PT Start Time 0900   PT Stop Time 0940   PT Time Calculation (min) 40 min   Activity Tolerance Patient tolerated treatment well   Behavior During Therapy [facility] for tasks assessed/performed      Past Medical History:  Diagnosis Date  . Acid reflux    mild-states if doesnt chew food completely he begins to cough- states has weakness in muscles due to ilieus- has been doing exreci this  . Adynamic ileus ([place]) 12/2014   ? anesthesia related per pt  . BPH (benign prostatic hyperplasia)   . Complication of anesthesia    10/11/15- per pt "had [place]- in 11/16-related it to possibly anesthesia"  . H/O seasonal allergies   . Hyperlipidemia   . Hypertension     Past Surgical History:  Procedure Laterality Date  . APPENDECTOMY     approx 30 years ago/lwb  . CATARACT EXTRACTION W/PHACO Left 09/11/2012   Procedure: CATARACT EXTRACTION PHACO AND INTRAOCULAR LENS PLACEMENT (IOC);  Surgeon: Tonny Branch, MD;  Location: [facility];  Service: Ophthalmology;  Laterality: Left;  CDE: 19.52  . HIP ARTHROPLASTY Right 12/06/2014   Procedure: PARTIAL RIGHT HIP REPLACEMENT;  Surgeon: Carole Civil, MD;  Location: [facility];  Service: Orthopedics;  Laterality: Right;  . LUMBAR LAMINECTOMY/DECOMPRESSION MICRODISCECTOMY N/A 10/19/2015   Procedure: CENTRAL DECOMPRESSION LUMBAR LAMINECTOMY FOR SPINAL STENOSIS, L3,L4FORAMINOTOMY BILATERAL L3,L4 NERVE  ROOT;  Surgeon: Latanya Maudlin, MD;  Location: [facility];  Service: Orthopedics;  Laterality: N/A;  . UMBILICAL HERNIA REPAIR     Approx 10 years ago/lwb  . ureteral stone extraction      There were no vitals filed for this visit.      Subjective Assessment - 01/03/16 0904    Subjective patient arrives asking how to position himself to relieve pressure in his L hip while driving; his pain is still present especially after driving and in the later part of the day. It is a dull aching pain.  No falls or close calls since last time he was seen.    Pertinent History Multiple falls, Rt hip fracture, 3 epidural injections, decompressive laminectomy    Patient Stated Goals to have less falls, to have less pain, To be able to walk better; walk inside without his cane    Currently in Pain? No/denies                         [facility] Adult PT Treatment/Exercise - 01/03/16 0001      Lumbar Exercises: Standing   Other Standing Lumbar Exercises standing hip extensions 1x15  2 second holds      Lumbar Exercises: Supine   Ab Set 20 reps   AB Set Limitations 3 second holds; verbal/tactile cues    Clam 15 reps   Clam Limitations red TB    Bridge 15 reps   Bridge Limitations 2  sets    Other Supine Lumbar Exercises SAQx15, 2 second holds, 0#      Lumbar Exercises: Sidelying   Hip Abduction 10 reps   Hip Abduction Weights (lbs) cues for form      Lumbar Exercises: Prone   Straight Leg Raise --   Straight Leg Raises Limitations --             Balance Exercises - 01/03/16 0926      Balance Exercises: Standing   Standing Eyes Closed Narrow base of support (BOS);Foam/compliant surface;1 rep;Other (comment)  60 seconds    Tandem Stance Eyes open;Foam/compliant surface;3 reps;15 secs   Rockerboard Anterior/posterior;Lateral;Other (comment)  no UEs, min guard            PT Education - 01/03/16 0940    Education provided Yes   Education Details encouraged to call [employer]  if he feels he needs further adjustments on his lift shoe    Person(s) Educated Patient   Methods Explanation   Comprehension Verbalized understanding          PT Short Term Goals - 12/20/15 1224      PT SHORT TERM GOAL #1   Title Pt low back pain to be no greater than a 4/10 to allow pt to be walking for 15 minutes at a time for his back.   Time 4   Period Weeks   Status New     PT SHORT TERM GOAL #2   Title Pt to be able to single leg stance on both LE for 10 seconds to reduce risk of falls    Time 4   Period Weeks   Status New     PT SHORT TERM GOAL #3   Title Pt core and leg strength to improve to allow pt to come from sit to stand without difficulty    Time 4   Period Weeks   Status New           PT Long Term Goals - 12/20/15 1226      PT LONG TERM GOAL #1   Title Pt low back pain to be no greater than a 2/10 to allow pt to be walking for 30 minutes without pain    Time 8   Period Weeks   Status New     PT LONG TERM GOAL #2   Title Pt Single leg stance to be at least 20 on both legs to allow pt to be able to ambulate without the use of a cane.    Time 8   Period Weeks   Status New     PT LONG TERM GOAL #3   Title Pt core and leg strength to be increased to allow pt to be able to go up and down one flight of steps in a reciprocal manner without increased pain    Time 8   Period Weeks     PT LONG TERM GOAL #4   Title Pt to report that he has not had any falls in the past four weeks    Time 8   Period Weeks               Plan - 01/03/16 0941    Clinical Impression Statement Patient arrives today reporting increased pain in his L hip with driving; discussed use of towel for pressure relief/pain reduction while in the car. Otherwise continued POC with focus on proximal strength/lumbar stabilization with cues as needed; patient continues to have difficulty in consistently  performing appropriate technique with bed mobility and requires cues for this.  Introduced balance tasks today in parallel bars with min guard for reduced fall risk as well. Severe weakness noted in L glut med with attempt of sidelying hip ABD today, recommend doing this in resisted sidelying moving forward.    Rehab Potential Good   PT Frequency 2x / week   PT Duration 8 weeks   PT Treatment/Interventions ADLs/Self Care Home Management;Functional mobility training;Therapeutic activities;Therapeutic exercise;Balance training;Neuromuscular re-education;Patient/family education;Manual techniques   PT Next Visit Plan begin postrual and body mechanic education with sheet for ADL's, progress lumbar stabilization.  Balance.    PT Home Exercise Plan decompression 1-5; ab set; bridge, bent knee raise and hamstring stretch   Consulted and Agree with Plan of Care Patient      Patient will benefit from skilled therapeutic intervention in order to improve the following deficits and impairments:  Decreased activity tolerance, Decreased balance, Decreased mobility, Decreased range of motion, Decreased strength, Difficulty walking, Postural dysfunction, Pain, Increased muscle spasms  Visit Diagnosis: Intervertebral disc disorder with radiculopathy of lumbar region  Muscle weakness (generalized)  Fall, subsequent encounter     Problem List Patient Active Problem List   Diagnosis Date Noted  . Spinal stenosis, lumbar region, with neurogenic claudication 10/19/2015  . Closed right hip fracture ([place]) 12/05/2014  . Closed hip fracture ([place]) 12/05/2014  . Hypertension 12/05/2014  . BPH (benign prostatic hyperplasia) 12/05/2014  . Hyperlipidemia 12/05/2014  . Essential hypertension, benign 06/01/2010  . NUMBNESS/TINGLING 11/20/2006  . BACK PAIN 11/20/2006    Deniece Ree PT, DPT [facility] [address] [place], [place], [address] Phone: [phone]   Fax:  [phone]  Name: Dominic Hodges MRN:  [id number] Date of Birth: 06/03/1927

## 2016-01-05 ENCOUNTER — Ambulatory Visit (HOSPITAL_COMMUNITY): Payer: Medicare Other

## 2016-01-05 DIAGNOSIS — W19XXXD Unspecified fall, subsequent encounter: Secondary | ICD-10-CM

## 2016-01-05 DIAGNOSIS — M5116 Intervertebral disc disorders with radiculopathy, lumbar region: Secondary | ICD-10-CM

## 2016-01-05 DIAGNOSIS — M6281 Muscle weakness (generalized): Secondary | ICD-10-CM | POA: Diagnosis not present

## 2016-01-05 NOTE — Patient Instructions (Signed)
Tandem Stance    Standing safety beside counter or sink place right foot in front of left, heel touching toe both feet "straight ahead". Stand on Foot Triangle of Support with both feet. Balance in this position 30 seconds. Do with left foot in front of right.  Complete 3-5 times a day.  Copyright  VHI. All rights reserved.   SINGLE LIMB STANCE    Standing beside counter or since stand on one leg working on balance.  Continue with each leg up to 60" Repeat 5 times every day Copyright  VHI. All rights reserved.

## 2016-01-05 NOTE — Therapy (Signed)
[facility] [address] [place], [place], [address] Phone: [phone]   Fax:  [phone]  Physical Therapy Treatment  Patient Details  Name: Dominic Hodges MRN: [id number] Date of Birth: Feb 04, 1927 Referring Provider: Latanya Maudlin  Encounter Date: 01/05/2016      PT End of Session - 01/05/16 1308    Visit Number 6   Number of Visits 16   Date for PT Re-Evaluation 01/19/16   Authorization Type Medicare   Authorization - Visit Number 6   Authorization - Number of Visits 10   PT Start Time 1301   PT Stop Time 1350   PT Time Calculation (min) 49 min   Equipment Utilized During Treatment Gait belt   Activity Tolerance Patient tolerated treatment well   Behavior During Therapy [facility] for tasks assessed/performed      Past Medical History:  Diagnosis Date  . Acid reflux    mild-states if doesnt chew food completely he begins to cough- states has weakness in muscles due to ilieus- has been doing exreci this  . Adynamic ileus ([place]) 12/2014   ? anesthesia related per pt  . BPH (benign prostatic hyperplasia)   . Complication of anesthesia    10/11/15- per pt "had [place]- in 11/16-related it to possibly anesthesia"  . H/O seasonal allergies   . Hyperlipidemia   . Hypertension     Past Surgical History:  Procedure Laterality Date  . APPENDECTOMY     approx 30 years ago/lwb  . CATARACT EXTRACTION W/PHACO Left 09/11/2012   Procedure: CATARACT EXTRACTION PHACO AND INTRAOCULAR LENS PLACEMENT (IOC);  Surgeon: Tonny Branch, MD;  Location: [facility];  Service: Ophthalmology;  Laterality: Left;  CDE: 19.52  . HIP ARTHROPLASTY Right 12/06/2014   Procedure: PARTIAL RIGHT HIP REPLACEMENT;  Surgeon: Carole Civil, MD;  Location: [facility];  Service: Orthopedics;  Laterality: Right;  . LUMBAR LAMINECTOMY/DECOMPRESSION MICRODISCECTOMY N/A 10/19/2015   Procedure: CENTRAL DECOMPRESSION LUMBAR LAMINECTOMY FOR SPINAL  STENOSIS, L3,L4FORAMINOTOMY BILATERAL L3,L4 NERVE ROOT;  Surgeon: Latanya Maudlin, MD;  Location: [facility];  Service: Orthopedics;  Laterality: N/A;  . UMBILICAL HERNIA REPAIR     Approx 10 years ago/lwb  . ureteral stone extraction      There were no vitals filed for this visit.      Subjective Assessment - 01/05/16 1258    Subjective Pt stated he continues to have Lt lower back pain following sitting for long periods of time (30 minutes).  Reports compliance with HEP at least 3times/ week.  No reorts of recent falls.   Pertinent History Multiple falls, Rt hip fracture, 3 epidural injections, decompressive laminectomy    Patient Stated Goals to have less falls, to have less pain, To be able to walk better; walk inside without his cane    Currently in Pain? No/denies             [facility] Adult PT Treatment/Exercise - 01/05/16 0001      Lumbar Exercises: Standing   Scapular Retraction 10 reps;Theraband   Theraband Level (Scapular Retraction) Level 2 (Red)   Row 10 reps;Theraband   Theraband Level (Row) Level 2 (Red)   Shoulder Extension 10 reps;Theraband   Theraband Level (Shoulder Extension) Level 2 (Red)   Other Standing Lumbar Exercises standing in front of wall with cervical and scapular retraction 10x5" then posture ex with UE flexion intront of wall             Balance  Exercises - 01/05/16 1400      Balance Exercises: Standing   Tandem Stance Eyes open;Foam/compliant surface   SLS Eyes open;Solid surface;2 reps  Lt 11" Rt 2 x 30" wiht 1 finger A   Rockerboard Anterior/posterior;Lateral;Other (comment)  2 min each no UE A   Sidestepping 1 rep;Theraband  RTB down hallway, cueing to reduce Lt LE ER             PT Short Term Goals - 12/20/15 1224      PT SHORT TERM GOAL #1   Title Pt low back pain to be no greater than a 4/10 to allow pt to be walking for 15 minutes at a time for his back.   Time 4   Period Weeks   Status New     PT SHORT TERM GOAL #2    Title Pt to be able to single leg stance on both LE for 10 seconds to reduce risk of falls    Time 4   Period Weeks   Status New     PT SHORT TERM GOAL #3   Title Pt core and leg strength to improve to allow pt to come from sit to stand without difficulty    Time 4   Period Weeks   Status New           PT Long Term Goals - 12/20/15 1226      PT LONG TERM GOAL #1   Title Pt low back pain to be no greater than a 2/10 to allow pt to be walking for 30 minutes without pain    Time 8   Period Weeks   Status New     PT LONG TERM GOAL #2   Title Pt Single leg stance to be at least 20 on both legs to allow pt to be able to ambulate without the use of a cane.    Time 8   Period Weeks   Status New     PT LONG TERM GOAL #3   Title Pt core and leg strength to be increased to allow pt to be able to go up and down one flight of steps in a reciprocal manner without increased pain    Time 8   Period Weeks     PT LONG TERM GOAL #4   Title Pt to report that he has not had any falls in the past four weeks    Time 8   Period Weeks               Plan - 01/05/16 1407    Clinical Impression Statement Pt educated on importance of proper posture, progressed to posture strengthening this session.  Min verbal cueing and demonstration to reduce forward head and kyphotic posture.  Began theraband wiht good technqiues following cueing for form with task.  Balance activities addressed with min A for safety.  Reviewed complaince wiht HEP, pt given additional exercises to progress balance and strengthen at home.  No reports of pain through session. was limited by fatigue.     Rehab Potential Good   PT Frequency 2x / week   PT Duration 8 weeks   PT Treatment/Interventions ADLs/Self Care Home Management;Functional mobility training;Therapeutic activities;Therapeutic exercise;Balance training;Neuromuscular re-education;Patient/family education;Manual techniques   PT Next Visit Plan Continue with  postrual and body mechanic education with sheet for ADL's, progress lumbar stabilization.  Balance.    PT Home Exercise Plan decompression 1-5; ab set; bridge, bent knee raise and hamstring stretch;  12/28: balance and posture strengthenuing with RTB      Patient will benefit from skilled therapeutic intervention in order to improve the following deficits and impairments:  Decreased activity tolerance, Decreased balance, Decreased mobility, Decreased range of motion, Decreased strength, Difficulty walking, Postural dysfunction, Pain, Increased muscle spasms  Visit Diagnosis: Intervertebral disc disorder with radiculopathy of lumbar region  Muscle weakness (generalized)  Fall, subsequent encounter     Problem List Patient Active Problem List   Diagnosis Date Noted  . Spinal stenosis, lumbar region, with neurogenic claudication 10/19/2015  . Closed right hip fracture ([place]) 12/05/2014  . Closed hip fracture ([place]) 12/05/2014  . Hypertension 12/05/2014  . BPH (benign prostatic hyperplasia) 12/05/2014  . Hyperlipidemia 12/05/2014  . Essential hypertension, benign 06/01/2010  . NUMBNESS/TINGLING 11/20/2006  . BACK PAIN 11/20/2006   Ihor [place], [place]; [place]  Aldona Lento 01/05/2016, 2:12 PM  [facility] [place], [place], [address] Phone: [phone]   Fax:  [phone]  Name: Dominic Hodges MRN: [id number] Date of Birth: 06-12-1927

## 2016-01-11 ENCOUNTER — Ambulatory Visit (HOSPITAL_COMMUNITY): Payer: Medicare Other | Attending: Orthopedic Surgery | Admitting: Physical Therapy

## 2016-01-11 DIAGNOSIS — M6281 Muscle weakness (generalized): Secondary | ICD-10-CM

## 2016-01-11 DIAGNOSIS — Z4789 Encounter for other orthopedic aftercare: Secondary | ICD-10-CM | POA: Insufficient documentation

## 2016-01-11 DIAGNOSIS — M48061 Spinal stenosis, lumbar region without neurogenic claudication: Secondary | ICD-10-CM | POA: Insufficient documentation

## 2016-01-11 DIAGNOSIS — M5136 Other intervertebral disc degeneration, lumbar region: Secondary | ICD-10-CM | POA: Diagnosis not present

## 2016-01-11 DIAGNOSIS — W19XXXD Unspecified fall, subsequent encounter: Secondary | ICD-10-CM

## 2016-01-11 DIAGNOSIS — M5116 Intervertebral disc disorders with radiculopathy, lumbar region: Secondary | ICD-10-CM

## 2016-01-11 NOTE — Therapy (Signed)
[facility] [address] [place], [place], [address] Phone: [phone]   Fax:  [phone]  Physical Therapy Treatment  Patient Details  Name: Dominic Hodges MRN: [id number] Date of Birth: July 16, 1927 Referring Provider: Latanya Maudlin  Encounter Date: 01/11/2016      PT End of Session - 01/11/16 1213    Visit Number 7   Number of Visits 16   Date for PT Re-Evaluation 01/19/16   Authorization Type Medicare   Authorization - Visit Number 7   Authorization - Number of Visits 10   PT Start Time 1120   PT Stop Time 1208   PT Time Calculation (min) 48 min   Equipment Utilized During Treatment Gait belt   Activity Tolerance Patient tolerated treatment well   Behavior During Therapy [facility] for tasks assessed/performed      Past Medical History:  Diagnosis Date  . Acid reflux    mild-states if doesnt chew food completely he begins to cough- states has weakness in muscles due to ilieus- has been doing exreci this  . Adynamic ileus ([place]) 12/2014   ? anesthesia related per pt  . BPH (benign prostatic hyperplasia)   . Complication of anesthesia    10/11/15- per pt "had [place]- in 11/16-related it to possibly anesthesia"  . H/O seasonal allergies   . Hyperlipidemia   . Hypertension     Past Surgical History:  Procedure Laterality Date  . APPENDECTOMY     approx 30 years ago/lwb  . CATARACT EXTRACTION W/PHACO Left 09/11/2012   Procedure: CATARACT EXTRACTION PHACO AND INTRAOCULAR LENS PLACEMENT (IOC);  Surgeon: Tonny Branch, MD;  Location: [facility];  Service: Ophthalmology;  Laterality: Left;  CDE: 19.52  . HIP ARTHROPLASTY Right 12/06/2014   Procedure: PARTIAL RIGHT HIP REPLACEMENT;  Surgeon: Carole Civil, MD;  Location: [facility];  Service: Orthopedics;  Laterality: Right;  . LUMBAR LAMINECTOMY/DECOMPRESSION MICRODISCECTOMY N/A 10/19/2015   Procedure: CENTRAL DECOMPRESSION LUMBAR LAMINECTOMY FOR SPINAL  STENOSIS, L3,L4FORAMINOTOMY BILATERAL L3,L4 NERVE ROOT;  Surgeon: Latanya Maudlin, MD;  Location: [facility];  Service: Orthopedics;  Laterality: N/A;  . UMBILICAL HERNIA REPAIR     Approx 10 years ago/lwb  . ureteral stone extraction      There were no vitals filed for this visit.      Subjective Assessment - 01/11/16 1219    Subjective Pt states having difficulty completing his exercises in his bed due to surface being to soft.  STates he has been doing what he can at home.  No pain and no falls lately.                         [facility] Adult PT Treatment/Exercise - 01/11/16 0001      Lumbar Exercises: Standing   Scapular Retraction Theraband;15 reps   Theraband Level (Scapular Retraction) Level 2 (Red)  review for HEP   Row Theraband;15 reps   Theraband Level (Row) Level 2 (Red)  review for HEP   Shoulder Extension Theraband;15 reps   Theraband Level (Shoulder Extension) Level 2 (Red)  review for HEP   Other Standing Lumbar Exercises standing in front of wall with cervical and scapular retraction 10x5" then posture ex with UE flexion intront of wall     Lumbar Exercises: Supine   Clam 15 reps   Clam Limitations red TB    Bridge 15 reps   Bridge Limitations 2 sets    Straight Leg  Raise 10 reps   Straight Leg Raises Limitations cueing for knee extension             Balance Exercises - 01/11/16 1135      Balance Exercises: Standing   Tandem Stance Eyes open;Foam/compliant surface;2 reps;30 secs   SLS Eyes open;Solid surface;2 reps   SLS with Vectors Solid surface;5 reps  3" Rt LE only with 1 HHA   Rockerboard Anterior/posterior;Lateral;Other (comment)   Tandem Gait 1 rep   Retro Gait 1 rep   Sidestepping 1 rep           PT Education - 01/11/16 1214    Education provided Yes   Education Details discussed ordering a table to complete therex on if needed.  PT shown various massage tables on [employer]) Educated Patient   Methods  Explanation;Demonstration   Comprehension Verbalized understanding          PT Short Term Goals - 12/20/15 1224      PT SHORT TERM GOAL #1   Title Pt low back pain to be no greater than a 4/10 to allow pt to be walking for 15 minutes at a time for his back.   Time 4   Period Weeks   Status New     PT SHORT TERM GOAL #2   Title Pt to be able to single leg stance on both LE for 10 seconds to reduce risk of falls    Time 4   Period Weeks   Status New     PT SHORT TERM GOAL #3   Title Pt core and leg strength to improve to allow pt to come from sit to stand without difficulty    Time 4   Period Weeks   Status New           PT Long Term Goals - 12/20/15 1226      PT LONG TERM GOAL #1   Title Pt low back pain to be no greater than a 2/10 to allow pt to be walking for 30 minutes without pain    Time 8   Period Weeks   Status New     PT LONG TERM GOAL #2   Title Pt Single leg stance to be at least 20 on both legs to allow pt to be able to ambulate without the use of a cane.    Time 8   Period Weeks   Status New     PT LONG TERM GOAL #3   Title Pt core and leg strength to be increased to allow pt to be able to go up and down one flight of steps in a reciprocal manner without increased pain    Time 8   Period Weeks     PT LONG TERM GOAL #4   Title Pt to report that he has not had any falls in the past four weeks    Time 8   Period Weeks               Plan - 01/11/16 1215    Clinical Impression Statement Continued with exercise progression focussing on increasing LE strength, balance and gait.  Pt with extreme Rt hip weakness as noted with trendelenburg gait and inability to SLS on Rt LE without UE assist.  Focused on this as well as equalizing stride with gait to increase WB time on Rt LE.  Added vector stance on Rt.    Rehab Potential Good   PT Frequency 2x /  week   PT Duration 8 weeks   PT Treatment/Interventions ADLs/Self Care Home Management;Functional  mobility training;Therapeutic activities;Therapeutic exercise;Balance training;Neuromuscular re-education;Patient/family education;Manual techniques   PT Next Visit Plan Continue with postrual and body mechanic education with sheet for ADL's, progress lumbar stabilization.  Balance.    PT Home Exercise Plan decompression 1-5; ab set; bridge, bent knee raise and hamstring stretch; 12/28: balance and posture strengthenuing with RTB      Patient will benefit from skilled therapeutic intervention in order to improve the following deficits and impairments:  Decreased activity tolerance, Decreased balance, Decreased mobility, Decreased range of motion, Decreased strength, Difficulty walking, Postural dysfunction, Pain, Increased muscle spasms  Visit Diagnosis: Intervertebral disc disorder with radiculopathy of lumbar region  Muscle weakness (generalized)  Fall, subsequent encounter     Problem List Patient Active Problem List   Diagnosis Date Noted  . Spinal stenosis, lumbar region, with neurogenic claudication 10/19/2015  . Closed right hip fracture ([place]) 12/05/2014  . Closed hip fracture ([place]) 12/05/2014  . Hypertension 12/05/2014  . BPH (benign prostatic hyperplasia) 12/05/2014  . Hyperlipidemia 12/05/2014  . Essential hypertension, benign 06/01/2010  . NUMBNESS/TINGLING 11/20/2006  . BACK PAIN 11/20/2006    Teena Irani, PTA/CLT [phone]  01/11/2016, 12:21 PM  [facility] [address] [place], [place], [address] Phone: [phone]   Fax:  [phone]  Name: Dominic Hodges MRN: [id number] Date of Birth: February 10, 1927

## 2016-01-13 ENCOUNTER — Ambulatory Visit (HOSPITAL_COMMUNITY): Payer: Medicare Other | Admitting: Physical Therapy

## 2016-01-13 DIAGNOSIS — Z4789 Encounter for other orthopedic aftercare: Secondary | ICD-10-CM | POA: Diagnosis not present

## 2016-01-13 DIAGNOSIS — M48061 Spinal stenosis, lumbar region without neurogenic claudication: Secondary | ICD-10-CM | POA: Diagnosis not present

## 2016-01-13 DIAGNOSIS — M6281 Muscle weakness (generalized): Secondary | ICD-10-CM

## 2016-01-13 DIAGNOSIS — M5136 Other intervertebral disc degeneration, lumbar region: Secondary | ICD-10-CM | POA: Diagnosis not present

## 2016-01-13 DIAGNOSIS — M5116 Intervertebral disc disorders with radiculopathy, lumbar region: Secondary | ICD-10-CM

## 2016-01-13 DIAGNOSIS — W19XXXD Unspecified fall, subsequent encounter: Secondary | ICD-10-CM

## 2016-01-13 NOTE — Therapy (Addendum)
[facility] [address] [place], [place], [address] Phone: [phone]   Fax:  [phone]  Physical Therapy Treatment  Patient Details  Name: Dominic Hodges MRN: [id number] Date of Birth: 01-14-1927 Referring Provider: Latanya Maudlin  Encounter Date: 01/13/2016      PT End of Session - 01/13/16 1224    Visit Number 8   Number of Visits 8   Date for PT Re-Evaluation 01/19/16   Authorization Type Medicare   Authorization - Visit Number 8   Authorization - Number of Visits 8   PT Start Time 0945   PT Stop Time 1037   PT Time Calculation (min) 52 min   Equipment Utilized During Treatment Gait belt   Activity Tolerance Patient tolerated treatment well   Behavior During Therapy [facility] for tasks assessed/performed      Past Medical History:  Diagnosis Date  . Acid reflux    mild-states if doesnt chew food completely he begins to cough- states has weakness in muscles due to ilieus- has been doing exreci this  . Adynamic ileus ([place]) 12/2014   ? anesthesia related per pt  . BPH (benign prostatic hyperplasia)   . Complication of anesthesia    10/11/15- per pt "had [place]- in 11/16-related it to possibly anesthesia"  . H/O seasonal allergies   . Hyperlipidemia   . Hypertension     Past Surgical History:  Procedure Laterality Date  . APPENDECTOMY     approx 30 years ago/lwb  . CATARACT EXTRACTION W/PHACO Left 09/11/2012   Procedure: CATARACT EXTRACTION PHACO AND INTRAOCULAR LENS PLACEMENT (IOC);  Surgeon: Tonny Branch, MD;  Location: [facility];  Service: Ophthalmology;  Laterality: Left;  CDE: 19.52  . HIP ARTHROPLASTY Right 12/06/2014   Procedure: PARTIAL RIGHT HIP REPLACEMENT;  Surgeon: Carole Civil, MD;  Location: [facility];  Service: Orthopedics;  Laterality: Right;  . LUMBAR LAMINECTOMY/DECOMPRESSION MICRODISCECTOMY N/A 10/19/2015   Procedure: CENTRAL DECOMPRESSION LUMBAR LAMINECTOMY FOR SPINAL  STENOSIS, L3,L4FORAMINOTOMY BILATERAL L3,L4 NERVE ROOT;  Surgeon: Latanya Maudlin, MD;  Location: [facility];  Service: Orthopedics;  Laterality: N/A;  . UMBILICAL HERNIA REPAIR     Approx 10 years ago/lwb  . ureteral stone extraction      There were no vitals filed for this visit.      Subjective Assessment - 01/13/16 0951    Subjective Pt doing better overall; just has Lt hip pain by the end of the day.     Pertinent History Multiple falls, Rt hip fracture, 3 epidural injections, decompressive laminectomy    Patient Stated Goals to have less falls, to have less pain, To be able to walk better; walk inside without his cane    Currently in Pain? No/denies                         [facility] Adult PT Treatment/Exercise - 01/13/16 0001      Lumbar Exercises: Stretches   Active Hamstring Stretch 3 reps;30 seconds     Lumbar Exercises: Standing   Other Standing Lumbar Exercises wall arch x 5      Lumbar Exercises: Seated   Sit to Stand 5 reps     Lumbar Exercises: Supine   Clam 15 reps   Bridge 15 reps   Other Supine Lumbar Exercises decompression exercises 1-5;; Hip abduction x 10 B    Other Supine Lumbar Exercises Dead bug x 20  Lumbar Exercises: Sidelying   Hip Abduction 10 reps   Hip Abduction Weights (lbs) cues for form              Balance Exercises - 01/13/16 1223      Balance Exercises: Standing   Tandem Gait Forward;Retro;2 reps   Retro Gait 1 rep             PT Short Term Goals - 01/13/16 1226      PT SHORT TERM GOAL #1   Title Pt low back pain to be no greater than a 4/10 to allow pt to be walking for 15 minutes at a time for his back.   Time 4   Period Weeks   Status On-going     PT SHORT TERM GOAL #2   Title Pt to be able to single leg stance on both LE for 10 seconds to reduce risk of falls    Time 4   Period Weeks   Status On-going     PT SHORT TERM GOAL #3   Title Pt core and leg strength to improve to allow pt to come  from sit to stand without difficulty    Time 4   Period Weeks   Status Achieved           PT Long Term Goals - 01/13/16 1226      PT LONG TERM GOAL #1   Title Pt low back pain to be no greater than a 2/10 to allow pt to be walking for 30 minutes without pain    Time 8   Period Weeks   Status On-going     PT LONG TERM GOAL #2   Title Pt Single leg stance to be at least 20 on both legs to allow pt to be able to ambulate without the use of a cane.    Time 8   Period Weeks   Status On-going     PT LONG TERM GOAL #3   Title Pt core and leg strength to be increased to allow pt to be able to go up and down one flight of steps in a reciprocal manner without increased pain    Time 8   Period Weeks   Status On-going     PT LONG TERM GOAL #4   Title Pt to report that he has not had any falls in the past four weeks    Time 8   Period Weeks   Status On-going               Plan - 01/13/16 1224    Clinical Impression Statement Added wall arches and sidelying hip abduction into patient HEP.  PT has weakness in hip extensors and abductors that affects the stability of his gait as well as the pain in his back.     Rehab Potential Good   PT Frequency 2x / week   PT Duration 8 weeks   PT Treatment/Interventions ADLs/Self Care Home Management;Functional mobility training;Therapeutic activities;Therapeutic exercise;Balance training;Neuromuscular re-education;Patient/family education;Manual techniques   PT Next Visit Plan Continue with balace as well as strengthening hip abductors and extensors.    PT Home Exercise Plan decompression 1-5; ab set; bridge, bent knee raise and hamstring stretch; 12/28: balance and posture strengthenuing with RTB      Patient will benefit from skilled therapeutic intervention in order to improve the following deficits and impairments:  Decreased activity tolerance, Decreased balance, Decreased mobility, Decreased range of motion, Decreased strength,  Difficulty walking, Postural  dysfunction, Pain, Increased muscle spasms  Visit Diagnosis: Intervertebral disc disorder with radiculopathy of lumbar region  Muscle weakness (generalized)  Fall, subsequent encounter     Problem List Patient Active Problem List   Diagnosis Date Noted  . Spinal stenosis, lumbar region, with neurogenic claudication 10/19/2015  . Closed right hip fracture ([place]) 12/05/2014  . Closed hip fracture ([place]) 12/05/2014  . Hypertension 12/05/2014  . BPH (benign prostatic hyperplasia) 12/05/2014  . Hyperlipidemia 12/05/2014  . Essential hypertension, benign 06/01/2010  . NUMBNESS/TINGLING 11/20/2006  . BACK PAIN 11/20/2006   Rayetta Humphrey, PT CLT [phone] 01/13/2016, 12:30 PM  [facility] [address] [place], [place], [address] Phone: [phone]   Fax:  [phone]  Name: Dominic Hodges MRN: [id number] Date of Birth: 02-27-1927   PHYSICAL THERAPY DISCHARGE SUMMARY  Visits from Start of Care: 8 Current functional level related to goals / functional outcomes: Improved activity tolerance   Remaining deficits: Continues to have hip pain with increased activity    Education / Equipment: HEP Plan: Patient agrees to discharge.  Patient goals were partially met. Patient is being discharged due to the physician's request.  ?????       Rayetta Humphrey, [place] CLT [phone]

## 2016-01-13 NOTE — Patient Instructions (Addendum)
Hip Abduction / Adduction: with Extended Knee (Supine)    Bring left leg out to side and return. Keep knee straight. Repeat _10___ times per set. Do __1__ sets per session. Do _2___ sessions per day.  http://orth.exer.us/680   Copyright  VHI. All rights reserved.  Hip Abduction / Adduction: with Knee Flexion (Supine)    With right knee bent, gently lower knee to side and return. Repeat _10___ times per set. Do __1__ sets per session. Do _2___ sessions per day.  http://orth.exer.us/682   Copyright  VHI. All rights reserved.  Strengthening: Hip Abduction (Side-Lying)    Tighten muscles on front of left thigh, then lift leg __6__ inches from surface, keeping knee locked. 6 Repeat _10___ times per set. Do __1__ sets per session. Do _2___ sessions per day.  http://orth.exer.us/622   Copyright  VHI. All rights reserved.

## 2016-01-16 ENCOUNTER — Telehealth (HOSPITAL_COMMUNITY): Payer: Self-pay | Admitting: Physical Therapy

## 2016-01-16 DIAGNOSIS — M5136 Other intervertebral disc degeneration, lumbar region: Secondary | ICD-10-CM | POA: Diagnosis not present

## 2016-01-16 DIAGNOSIS — Z4789 Encounter for other orthopedic aftercare: Secondary | ICD-10-CM | POA: Diagnosis not present

## 2016-01-16 DIAGNOSIS — M4807 Spinal stenosis, lumbosacral region: Secondary | ICD-10-CM | POA: Diagnosis not present

## 2016-01-16 NOTE — Telephone Encounter (Signed)
Pt states that Dr. Gladstone Lighter wants him to stop PT for now and be D/c. Will get new order when he needs to return.

## 2016-01-17 ENCOUNTER — Ambulatory Visit (HOSPITAL_COMMUNITY): Payer: Medicare Other

## 2016-01-19 ENCOUNTER — Ambulatory Visit (HOSPITAL_COMMUNITY): Payer: Medicare Other | Admitting: Physical Therapy

## 2016-01-24 ENCOUNTER — Encounter (HOSPITAL_COMMUNITY): Payer: Self-pay | Admitting: Physical Therapy

## 2016-01-26 ENCOUNTER — Encounter (HOSPITAL_COMMUNITY): Payer: Self-pay | Admitting: Physical Therapy

## 2016-01-31 ENCOUNTER — Encounter (HOSPITAL_COMMUNITY): Payer: Self-pay | Admitting: Physical Therapy

## 2016-02-09 DIAGNOSIS — L570 Actinic keratosis: Secondary | ICD-10-CM | POA: Diagnosis not present

## 2016-02-09 DIAGNOSIS — S80812A Abrasion, left lower leg, initial encounter: Secondary | ICD-10-CM | POA: Diagnosis not present

## 2016-02-09 DIAGNOSIS — C44329 Squamous cell carcinoma of skin of other parts of face: Secondary | ICD-10-CM | POA: Diagnosis not present

## 2016-02-09 DIAGNOSIS — X32XXXD Exposure to sunlight, subsequent encounter: Secondary | ICD-10-CM | POA: Diagnosis not present

## 2016-02-29 ENCOUNTER — Encounter: Payer: Self-pay | Admitting: Orthopaedic Surgery

## 2016-02-29 ENCOUNTER — Ambulatory Visit (INDEPENDENT_AMBULATORY_CARE_PROVIDER_SITE_OTHER): Payer: Medicare Other | Admitting: Orthopaedic Surgery

## 2016-02-29 VITALS — BP 138/70 | HR 73 | Temp 97.0°F | Ht 73.0 in | Wt 175.0 lb

## 2016-02-29 DIAGNOSIS — M25521 Pain in right elbow: Secondary | ICD-10-CM | POA: Diagnosis not present

## 2016-02-29 NOTE — Progress Notes (Signed)
Patient JS:EGBTD [name], male DOB:1927/02/16, 83 y.o. VVO:[id number]  Chief Complaint  Patient presents with  . Elbow Injury    right elbow injury s/p fall 02/28/16    HPI  Carrel G Romeo Zielinski is a 83 y.o. male who fell last night and hurt his right lateral elbow area.  He had bleeding.  He has no loss of motion and no numbness.  He did not hurt his back or hip.  He did not hit his head.  He had back surgery about six to eight weeks ago. HPI  Body mass index is 23.09 kg/m.  ROS  Review of Systems  HENT: Negative for congestion.   Respiratory: Negative for cough and shortness of breath.   Cardiovascular: Negative for chest pain and leg swelling.  Endocrine: Negative for cold intolerance.  Musculoskeletal: Positive for arthralgias, back pain and gait problem.  Allergic/Immunologic: Negative for environmental allergies.    Past Medical History:  Diagnosis Date  . Acid reflux    mild-states if doesnt chew food completely he begins to cough- states has weakness in muscles due to ilieus- has been doing exreci this  . Adynamic ileus ([place]) 12/2014   ? anesthesia related per pt  . BPH (benign prostatic hyperplasia)   . Complication of anesthesia    10/11/15- per pt "had [place]- in 11/16-related it to possibly anesthesia"  . H/O seasonal allergies   . Hyperlipidemia   . Hypertension     Past Surgical History:  Procedure Laterality Date  . APPENDECTOMY     approx 30 years ago/lwb  . CATARACT EXTRACTION W/PHACO Left 09/11/2012   Procedure: CATARACT EXTRACTION PHACO AND INTRAOCULAR LENS PLACEMENT (IOC);  Surgeon: Tonny Branch, MD;  Location: [facility];  Service: Ophthalmology;  Laterality: Left;  CDE: 19.52  . HIP ARTHROPLASTY Right 12/06/2014   Procedure: PARTIAL RIGHT HIP REPLACEMENT;  Surgeon: Carole Civil, MD;  Location: [facility];  Service: Orthopedics;  Laterality: Right;  . LUMBAR LAMINECTOMY/DECOMPRESSION MICRODISCECTOMY N/A 10/19/2015    Procedure: CENTRAL DECOMPRESSION LUMBAR LAMINECTOMY FOR SPINAL STENOSIS, L3,L4FORAMINOTOMY BILATERAL L3,L4 NERVE ROOT;  Surgeon: Latanya Maudlin, MD;  Location: [facility];  Service: Orthopedics;  Laterality: N/A;  . UMBILICAL HERNIA REPAIR     Approx 10 years ago/lwb  . ureteral stone extraction      Family History  Problem Relation Age of Onset  . Coronary artery disease      no family history of premature CAD    Social History Social History  Substance Use Topics  . Smoking status: Former Smoker    Types: Pipe  . Smokeless tobacco: Never Used  . Alcohol use Yes     Comment: occasional social drink/lwb    Allergies  Allergen Reactions  . Robaxin [Methocarbamol] Other (See Comments)    "made me crazy"  . Robaxin [Methocarbamol] Other (See Comments)    Mental confusion    Current Outpatient Prescriptions  Medication Sig Dispense Refill  . aspirin EC 81 MG tablet Take 81 mg by mouth daily.    . cholecalciferol (VITAMIN D) 1000 units tablet Take 5,000 Units by mouth daily.    . cloNIDine (CATAPRES - DOSED IN MG/24 HR) 0.1 mg/24hr patch Place 0.1 mg onto the skin once a week. TUESDAY    . HYDROcodone-acetaminophen (NORCO/VICODIN) 5-325 MG tablet Take 1-2 tablets by mouth every 4 (four) hours as needed (mild pain). 60 tablet 0  . rosuvastatin (CRESTOR) 20 MG tablet Take 20 mg by mouth daily.  No current facility-administered medications for this visit.      Physical Exam  Blood pressure 138/70, pulse 73, temperature 97 F (36.1 C), height 6\' 1"  (1.854 m), weight 175 lb (79.4 kg).  Constitutional: overall normal hygiene, normal nutrition, well developed, normal grooming, normal body habitus. Assistive device:cane  Musculoskeletal: gait and station Limp right , muscle tone and strength are normal, no tremors or atrophy is present.  .  Neurological: coordination overall normal.  Deep tendon reflex/nerve stretch intact.  Sensation normal.  Cranial nerves II-XII intact.    Skin:   He has a skin avulsion and skin tag over the right lateral elbow with no bleeding.  An op site dressing applied.  Psychiatric: Alert and oriented x 3.  Recent memory intact, remote memory unclear.  Normal mood and affect. Well groomed.  Good eye contact.  Cardiovascular: overall no swelling, no varicosities, no edema bilaterally, normal temperatures of the legs and arms, no clubbing, cyanosis and good capillary refill.  Lymphatic: palpation is normal.    The patient has been educated about the nature of the problem(s) and counseled on treatment options.  The patient appeared to understand what I have discussed and is in agreement with it.  Encounter Diagnosis  Name Primary?  . Right elbow pain Yes    PLAN Call if any problems.  Precautions discussed.  Continue current medications.   Return to clinic 1 week   Extra Op Site given him.  Electronically Signed Sanjuana Kava, MD 2/21/201811:08 AM

## 2016-03-01 DIAGNOSIS — M5136 Other intervertebral disc degeneration, lumbar region: Secondary | ICD-10-CM | POA: Diagnosis not present

## 2016-03-01 DIAGNOSIS — Z4789 Encounter for other orthopedic aftercare: Secondary | ICD-10-CM | POA: Diagnosis not present

## 2016-03-05 ENCOUNTER — Other Ambulatory Visit: Payer: Self-pay | Admitting: Orthopedic Surgery

## 2016-03-05 DIAGNOSIS — M961 Postlaminectomy syndrome, not elsewhere classified: Secondary | ICD-10-CM

## 2016-03-07 ENCOUNTER — Ambulatory Visit (INDEPENDENT_AMBULATORY_CARE_PROVIDER_SITE_OTHER): Payer: Medicare Other

## 2016-03-07 ENCOUNTER — Ambulatory Visit (INDEPENDENT_AMBULATORY_CARE_PROVIDER_SITE_OTHER): Payer: Medicare Other | Admitting: Orthopaedic Surgery

## 2016-03-07 ENCOUNTER — Encounter: Payer: Self-pay | Admitting: Orthopaedic Surgery

## 2016-03-07 VITALS — BP 133/73 | HR 84 | Ht 73.0 in | Wt 172.0 lb

## 2016-03-07 DIAGNOSIS — M25521 Pain in right elbow: Secondary | ICD-10-CM

## 2016-03-07 DIAGNOSIS — M25511 Pain in right shoulder: Secondary | ICD-10-CM

## 2016-03-07 NOTE — Progress Notes (Signed)
Patient KV:QQVZD Dominic Hodges, male DOB:1927-03-05, 83 y.o. GLO:[id number]  Chief Complaint  Patient presents with  . Follow-up    Right Elbow    HPI  Dominic Hodges is a 83 y.o. male who fell and hurt his right elbow last week.  He had a skin tear.  He has been using Op Site dressing and has done well.  He has some pain in the right shoulder since the fall and has some popping.  He has no redness or numbness.  ROM is tender. HPI  Body mass index is 22.69 kg/m.  ROS  Review of Systems  HENT: Negative for congestion.   Respiratory: Negative for cough and shortness of breath.   Cardiovascular: Negative for chest pain and leg swelling.  Endocrine: Negative for cold intolerance.  Musculoskeletal: Positive for arthralgias, back pain and gait problem.  Allergic/Immunologic: Negative for environmental allergies.    Past Medical History:  Diagnosis Date  . Acid reflux    mild-states if doesnt chew food completely he begins to cough- states has weakness in muscles due to ilieus- has been doing exreci this  . Adynamic ileus ([place]) 12/2014   ? anesthesia related per pt  . BPH (benign prostatic hyperplasia)   . Complication of anesthesia    10/11/15- per pt "had [place]- in 11/16-related it to possibly anesthesia"  . H/O seasonal allergies   . Hyperlipidemia   . Hypertension     Past Surgical History:  Procedure Laterality Date  . APPENDECTOMY     approx 30 years ago/lwb  . CATARACT EXTRACTION W/PHACO Left 09/11/2012   Procedure: CATARACT EXTRACTION PHACO AND INTRAOCULAR LENS PLACEMENT (IOC);  Surgeon: Tonny Branch, MD;  Location: [facility];  Service: Ophthalmology;  Laterality: Left;  CDE: 19.52  . HIP ARTHROPLASTY Right 12/06/2014   Procedure: PARTIAL RIGHT HIP REPLACEMENT;  Surgeon: Carole Civil, MD;  Location: [facility];  Service: Orthopedics;  Laterality: Right;  . LUMBAR LAMINECTOMY/DECOMPRESSION MICRODISCECTOMY N/A 10/19/2015   Procedure:  CENTRAL DECOMPRESSION LUMBAR LAMINECTOMY FOR SPINAL STENOSIS, L3,L4FORAMINOTOMY BILATERAL L3,L4 NERVE ROOT;  Surgeon: Latanya Maudlin, MD;  Location: [facility];  Service: Orthopedics;  Laterality: N/A;  . UMBILICAL HERNIA REPAIR     Approx 10 years ago/lwb  . ureteral stone extraction      Family History  Problem Relation Age of Onset  . Coronary artery disease      no family history of premature CAD    Social History Social History  Substance Use Topics  . Smoking status: Former Smoker    Types: Pipe  . Smokeless tobacco: Never Used  . Alcohol use Yes     Comment: occasional social drink/lwb    Allergies  Allergen Reactions  . Robaxin [Methocarbamol] Other (See Comments)    "made me crazy"  . Robaxin [Methocarbamol] Other (See Comments)    Mental confusion    Current Outpatient Prescriptions  Medication Sig Dispense Refill  . aspirin EC 81 MG tablet Take 81 mg by mouth daily.    . cholecalciferol (VITAMIN D) 1000 units tablet Take 5,000 Units by mouth daily.    . cloNIDine (CATAPRES - DOSED IN MG/24 HR) 0.1 mg/24hr patch Place 0.1 mg onto the skin once a week. TUESDAY    . HYDROcodone-acetaminophen (NORCO/VICODIN) 5-325 MG tablet Take 1-2 tablets by mouth every 4 (four) hours as needed (mild pain). 60 tablet 0  . rosuvastatin (CRESTOR) 20 MG tablet Take 20 mg by mouth daily.  No current facility-administered medications for this visit.      Physical Exam  Blood pressure 133/73, pulse 84, height 6\' 1"  (1.854 m), weight 172 lb (78 kg).  Constitutional: overall normal hygiene, normal nutrition, well developed, normal grooming, normal body habitus. Assistive device:cane  Musculoskeletal: gait and station Limp right, muscle tone and strength are normal, no tremors or atrophy is present.  .  Neurological: coordination overall normal.  Deep tendon reflex/nerve stretch intact.  Sensation normal.  Cranial nerves II-XII intact.   Skin:   Normal overall no scars, lesions,  ulcers or rashes. No psoriasis.  Psychiatric: Alert and oriented x 3.  Recent memory intact, remote memory unclear.  Normal mood and affect. Well groomed.  Good eye contact.  Cardiovascular: overall no swelling, no varicosities, no edema bilaterally, normal temperatures of the legs and arms, no clubbing, cyanosis and good capillary refill.  Lymphatic: palpation is normal.  Examination of right Upper Extremity is done.  Inspection:   Overall:  Elbow non-tender without crepitus or defects, forearm non-tender without crepitus or defects, wrist non-tender without crepitus or defects, hand non-tender. He has healing skin tag abrasion of the right elbow.  ROM full.  A new OP Site dressing applied.    Shoulder: with glenohumeral joint tenderness, without effusion.   Upper arm: without swelling and tenderness   Range of motion:   Overall:  Full range of motion of the elbow, full range of motion of wrist and full range of motion in fingers.   Shoulder:  right  145 degrees forward flexion; 100 degrees abduction; 30 degrees internal rotation, 30 degrees external rotation, 15 degrees extension, 40 degrees adduction.   Stability:   Overall:  Shoulder, elbow and wrist stable   Strength and Tone:   Overall full shoulder muscles strength, full upper arm strength and normal upper arm bulk and tone.   The patient has been educated about the nature of the problem(s) and counseled on treatment options.  The patient appeared to understand what I have discussed and is in agreement with it.  Encounter Diagnoses  Name Primary?  . Acute pain of right shoulder Yes  . Right elbow pain     PLAN Call if any problems.  Precautions discussed.  Continue current medications.   Return to clinic PRN   Electronically Signed Sanjuana Kava, MD 2/28/20189:59 AM

## 2016-03-09 ENCOUNTER — Ambulatory Visit
Admission: RE | Admit: 2016-03-09 | Discharge: 2016-03-09 | Disposition: A | Discharge status: Home / Self Care | Payer: Medicare Other | Source: Ambulatory Visit | Attending: Orthopedic Surgery | Admitting: Orthopedic Surgery

## 2016-03-09 DIAGNOSIS — M961 Postlaminectomy syndrome, not elsewhere classified: Secondary | ICD-10-CM

## 2016-03-09 DIAGNOSIS — M5416 Radiculopathy, lumbar region: Secondary | ICD-10-CM | POA: Diagnosis not present

## 2016-03-09 MED ORDER — METHYLPREDNISOLONE ACETATE 40 MG/ML INJ SUSP (RADIOLOG
120.0000 mg | Freq: Once | INTRAMUSCULAR | Status: AC
  Administered 2016-03-09: 120 mg via EPIDURAL

## 2016-03-09 MED ORDER — IOPAMIDOL (ISOVUE-M 200) INJECTION 41%
1.0000 mL | Freq: Once | INTRAMUSCULAR | Status: AC
  Administered 2016-03-09: 1 mL via EPIDURAL

## 2016-04-17 ENCOUNTER — Encounter: Payer: Self-pay | Admitting: Orthopaedic Surgery

## 2016-04-17 ENCOUNTER — Ambulatory Visit (INDEPENDENT_AMBULATORY_CARE_PROVIDER_SITE_OTHER): Payer: Medicare Other | Admitting: Orthopaedic Surgery

## 2016-04-17 VITALS — BP 148/75 | HR 73 | Temp 97.7°F | Ht 73.0 in | Wt 166.0 lb

## 2016-04-17 DIAGNOSIS — L600 Ingrowing nail: Secondary | ICD-10-CM

## 2016-04-17 DIAGNOSIS — M25511 Pain in right shoulder: Secondary | ICD-10-CM | POA: Diagnosis not present

## 2016-04-17 NOTE — Progress Notes (Signed)
Patient Dominic Hodges, male DOB:[birth year], 83 y.o. ZHY:[id number]  Chief Complaint  Patient presents with  . new problem    bilateral toenails, right shoulder    HPI  Dominic Hodges is a 83 y.o. male who has chronic pain of the right shoulder.  He want an injection.  He has a nail deformity of the great toe nail on the right foot and wants to nail removed today.  He has no other injury.   HPI  Body mass index is 21.9 kg/m.  ROS  Review of Systems  HENT: Negative for congestion.   Respiratory: Negative for cough and shortness of breath.   Cardiovascular: Negative for chest pain and leg swelling.  Endocrine: Negative for cold intolerance.  Musculoskeletal: Positive for arthralgias, back pain and gait problem.  Allergic/Immunologic: Negative for environmental allergies.    Past Medical History:  Diagnosis Date  . Acid reflux    mild-states if doesnt chew food completely he begins to cough- states has weakness in muscles due to ilieus- has been doing exreci this  . Adynamic ileus ([place]) 12/2014   ? anesthesia related per pt  . BPH (benign prostatic hyperplasia)   . Complication of anesthesia    10/11/15- per pt "had [place]- in 11/16-related it to possibly anesthesia"  . H/O seasonal allergies   . Hyperlipidemia   . Hypertension     Past Surgical History:  Procedure Laterality Date  . APPENDECTOMY     approx 30 years ago/lwb  . CATARACT EXTRACTION W/PHACO Left 09/11/2012   Procedure: CATARACT EXTRACTION PHACO AND INTRAOCULAR LENS PLACEMENT (IOC);  Surgeon: Tonny Branch, MD;  Location: [facility];  Service: Ophthalmology;  Laterality: Left;  CDE: 19.52  . HIP ARTHROPLASTY Right 12/06/2014   Procedure: PARTIAL RIGHT HIP REPLACEMENT;  Surgeon: Carole Civil, MD;  Location: [facility];  Service: Orthopedics;  Laterality: Right;  . LUMBAR LAMINECTOMY/DECOMPRESSION MICRODISCECTOMY N/A 10/19/2015   Procedure: CENTRAL DECOMPRESSION LUMBAR  LAMINECTOMY FOR SPINAL STENOSIS, L3,L4FORAMINOTOMY BILATERAL L3,L4 NERVE ROOT;  Surgeon: Latanya Maudlin, MD;  Location: [facility];  Service: Orthopedics;  Laterality: N/A;  . UMBILICAL HERNIA REPAIR     Approx 10 years ago/lwb  . ureteral stone extraction      Family History  Problem Relation Age of Onset  . Coronary artery disease      no family history of premature CAD    Social History Social History  Substance Use Topics  . Smoking status: Former Smoker    Types: Pipe  . Smokeless tobacco: Never Used  . Alcohol use Yes     Comment: occasional social drink/lwb    Allergies  Allergen Reactions  . Robaxin [Methocarbamol] Other (See Comments)    "made me crazy"  . Robaxin [Methocarbamol] Other (See Comments)    Mental confusion    Current Outpatient Prescriptions  Medication Sig Dispense Refill  . aspirin EC 81 MG tablet Take 81 mg by mouth daily.    . cholecalciferol (VITAMIN D) 1000 units tablet Take 5,000 Units by mouth daily.    . cloNIDine (CATAPRES - DOSED IN MG/24 HR) 0.1 mg/24hr patch Place 0.1 mg onto the skin once a week. TUESDAY    . HYDROcodone-acetaminophen (NORCO/VICODIN) 5-325 MG tablet Take 1-2 tablets by mouth every 4 (four) hours as needed (mild pain). 60 tablet 0  . rosuvastatin (CRESTOR) 20 MG tablet Take 20 mg by mouth daily.       No current facility-administered medications for this visit.  Physical Exam  Blood pressure (!) 148/75, pulse 73, temperature 97.7 F (36.5 C), height 6\' 1"  (1.854 m), weight 166 lb (75.3 kg).  Constitutional: overall normal hygiene, normal nutrition, well developed, normal grooming, normal body habitus. Assistive device:cane  Musculoskeletal: gait and station Limp right, muscle tone and strength are normal, no tremors or atrophy is present.  .  Neurological: coordination overall normal.  Deep tendon reflex/nerve stretch intact.  Sensation normal.  Cranial nerves II-XII intact.   Skin:   Normal overall no scars,  lesions, ulcers or rashes. No psoriasis.  Psychiatric: Alert and oriented x 3.  Recent memory intact, remote memory unclear.  Normal mood and affect. Well groomed.  Good eye contact.  Cardiovascular: overall no swelling, no varicosities, no edema bilaterally, normal temperatures of the legs and arms, no clubbing, cyanosis and good capillary refill.  Lymphatic: palpation is normal.  Examination of right Upper Extremity is done.  Inspection:   Overall:  Elbow non-tender without crepitus or defects, forearm non-tender without crepitus or defects, wrist non-tender without crepitus or defects, hand non-tender.    Shoulder: with glenohumeral joint tenderness, without effusion.   Upper arm: without swelling and tenderness   Range of motion:   Overall:  Full range of motion of the elbow, full range of motion of wrist and full range of motion in fingers.   Shoulder:  right  165 degrees forward flexion; 145 degrees abduction; 35 degrees internal rotation, 35 degrees external rotation, 15 degrees extension, 40 degrees adduction.   Stability:   Overall:  Shoulder, elbow and wrist stable   Strength and Tone:   Overall full shoulder muscles strength, full upper arm strength and normal upper arm bulk and tone.    The patient has been educated about the nature of the problem(s) and counseled on treatment options.  The patient appeared to understand what I have discussed and is in agreement with it.  Encounter Diagnoses  Name Primary?  . Acute pain of right shoulder Yes  . Ingrown nail of great toe of right foot    Procedure note:  After permission from the patient and sterile prep, a digital block was done of the right toe both medial and lateral using 1% Xylocaine.  Once anesthesia was obtained, the nail was removed with a hemostat.  Sterile dressing was applied after hemostasis was obtained.    PLAN Call if any problems.  Precautions discussed.  Continue current medications.   Return to  clinic in two days for wound check.   Electronically Signed Sanjuana Kava, MD 4/10/201811:27 AM

## 2016-04-19 ENCOUNTER — Ambulatory Visit (INDEPENDENT_AMBULATORY_CARE_PROVIDER_SITE_OTHER): Payer: Medicare Other | Admitting: Orthopaedic Surgery

## 2016-04-19 DIAGNOSIS — G8929 Other chronic pain: Secondary | ICD-10-CM | POA: Diagnosis not present

## 2016-04-19 DIAGNOSIS — L600 Ingrowing nail: Secondary | ICD-10-CM

## 2016-04-19 DIAGNOSIS — M25561 Pain in right knee: Secondary | ICD-10-CM | POA: Diagnosis not present

## 2016-04-19 NOTE — Progress Notes (Signed)
Patient DJ:SHFWY Dominic Hodges, male DOB:1927/10/04, 83 y.o. OVZ:[id number]  Chief Complaint  Patient presents with  . Follow-up    Wound check toe    HPI  Dominic Hodges is a 83 y.o. male who had removal of the right great toe nail two days ago.  He has done well.  He has little pain.  He is having pain of the right knee and some swelling at times.  He has no trauma.  He is post right hip surgery last year.  He has no giving way.  He has no redness. HPI  There is no height or weight on file to calculate BMI.  ROS  Review of Systems  HENT: Negative for congestion.   Respiratory: Negative for cough and shortness of breath.   Cardiovascular: Negative for chest pain and leg swelling.  Endocrine: Negative for cold intolerance.  Musculoskeletal: Positive for arthralgias, back pain and gait problem.  Allergic/Immunologic: Negative for environmental allergies.    Past Medical History:  Diagnosis Date  . Acid reflux    mild-states if doesnt chew food completely he begins to cough- states has weakness in muscles due to ilieus- has been doing exreci this  . Adynamic ileus ([place]) 12/2014   ? anesthesia related per pt  . BPH (benign prostatic hyperplasia)   . Complication of anesthesia    10/11/15- per pt "had [place]- in 11/16-related it to possibly anesthesia"  . H/O seasonal allergies   . Hyperlipidemia   . Hypertension     Past Surgical History:  Procedure Laterality Date  . APPENDECTOMY     approx 30 years ago/lwb  . CATARACT EXTRACTION W/PHACO Left 09/11/2012   Procedure: CATARACT EXTRACTION PHACO AND INTRAOCULAR LENS PLACEMENT (IOC);  Surgeon: Tonny Branch, MD;  Location: [facility];  Service: Ophthalmology;  Laterality: Left;  CDE: 19.52  . HIP ARTHROPLASTY Right 12/06/2014   Procedure: PARTIAL RIGHT HIP REPLACEMENT;  Surgeon: Carole Civil, MD;  Location: [facility];  Service: Orthopedics;  Laterality: Right;  . LUMBAR LAMINECTOMY/DECOMPRESSION  MICRODISCECTOMY N/A 10/19/2015   Procedure: CENTRAL DECOMPRESSION LUMBAR LAMINECTOMY FOR SPINAL STENOSIS, L3,L4FORAMINOTOMY BILATERAL L3,L4 NERVE ROOT;  Surgeon: Latanya Maudlin, MD;  Location: [facility];  Service: Orthopedics;  Laterality: N/A;  . UMBILICAL HERNIA REPAIR     Approx 10 years ago/lwb  . ureteral stone extraction      Family History  Problem Relation Age of Onset  . Coronary artery disease      no family history of premature CAD    Social History Social History  Substance Use Topics  . Smoking status: Former Smoker    Types: Pipe  . Smokeless tobacco: Never Used  . Alcohol use Yes     Comment: occasional social drink/lwb    Allergies  Allergen Reactions  . Robaxin [Methocarbamol] Other (See Comments)    "made me crazy"  . Robaxin [Methocarbamol] Other (See Comments)    Mental confusion    Current Outpatient Prescriptions  Medication Sig Dispense Refill  . aspirin EC 81 MG tablet Take 81 mg by mouth daily.    . cholecalciferol (VITAMIN D) 1000 units tablet Take 5,000 Units by mouth daily.    . cloNIDine (CATAPRES - DOSED IN MG/24 HR) 0.1 mg/24hr patch Place 0.1 mg onto the skin once a week. TUESDAY    . HYDROcodone-acetaminophen (NORCO/VICODIN) 5-325 MG tablet Take 1-2 tablets by mouth every 4 (four) hours as needed (mild pain). 60 tablet 0  . rosuvastatin (CRESTOR)  20 MG tablet Take 20 mg by mouth daily.       No current facility-administered medications for this visit.      Physical Exam  There were no vitals taken for this visit.  Constitutional: overall normal hygiene, normal nutrition, well developed, normal grooming, normal body habitus. Assistive device:cane  Musculoskeletal: gait and station Limp right, muscle tone and strength are normal, no tremors or atrophy is present.  .  Neurological: coordination overall normal.  Deep tendon reflex/nerve stretch intact.  Sensation normal.  Cranial nerves II-XII intact.   Skin:   Normal overall no scars,  lesions, ulcers or rashes. No psoriasis.  Psychiatric: Alert and oriented x 3.  Recent memory intact, remote memory unclear.  Normal mood and affect. Well groomed.  Good eye contact.  Cardiovascular: overall no swelling, no varicosities, no edema bilaterally, normal temperatures of the legs and arms, no clubbing, cyanosis and good capillary refill.  Lymphatic: palpation is normal.  The great toe looks good.  Dressing removed.  He has no drainage, no redness.  A new dressing applied.  The right lower extremity is examined:  Inspection:  Thigh:  Non-tender and no defects  Knee has swelling 1/2+ effusion.                        Joint tenderness is present                        Patient is tender over the medial joint line  Lower Leg:  Has normal appearance and no tenderness or defects  Ankle:  Non-tender and no defects  Foot:  Non-tender and no defects Range of Motion:  Knee:  Range of motion is: 0-110                        Crepitus is  present  Ankle:  Range of motion is normal. Strength and Tone:  The right lower extremity has normal strength and tone. Stability:  Knee:  The knee is stable.  Ankle:  The ankle is stable.     The patient has been educated about the nature of the problem(s) and counseled on treatment options.  The patient appeared to understand what I have discussed and is in agreement with it.  Encounter Diagnoses  Name Primary?  . Ingrown nail of great toe of right foot Yes  . Chronic pain of right knee    PROCEDURE NOTE:  The patient requests injections of the right knee , verbal consent was obtained.  The right knee was prepped appropriately after time out was performed.   Sterile technique was observed and injection of 1 cc of Depo-Medrol 40 mg with several cc's of plain xylocaine. Anesthesia was provided by ethyl chloride and a 20-gauge needle was used to inject the knee area. The injection was tolerated well.  A band aid dressing was applied.  The  patient was advised to apply ice later today and tomorrow to the injection sight as needed.  PLAN Call if any problems.  Precautions discussed.  Continue current medications.   Return to clinic 1 week   Electronically Signed Sanjuana Kava, MD 4/12/201810:12 AM

## 2016-04-26 ENCOUNTER — Encounter: Payer: Self-pay | Admitting: Orthopaedic Surgery

## 2016-04-26 ENCOUNTER — Ambulatory Visit (INDEPENDENT_AMBULATORY_CARE_PROVIDER_SITE_OTHER): Payer: Medicare Other | Admitting: Orthopaedic Surgery

## 2016-04-26 DIAGNOSIS — L600 Ingrowing nail: Secondary | ICD-10-CM | POA: Diagnosis not present

## 2016-04-26 NOTE — Progress Notes (Signed)
Patient Dominic Hodges, male DOB:[birth year], 83 y.o. [id number]  Chief Complaint  Patient presents with  . Follow-up    right toe wound    HPI  Dominic Hodges is a 83 y.o. male who has had ingrown nail on both great toes.  I had removed the right great toe nail earlier in the month.  It has done well. Now he want the left great toe nail removed as it is deformed and hurts and cuts his socks.  He has no trauma. HPI  There is no height or weight on file to calculate BMI.  ROS  Review of Systems  HENT: Negative for congestion.   Respiratory: Negative for cough and shortness of breath.   Cardiovascular: Negative for chest pain and leg swelling.  Endocrine: Negative for cold intolerance.  Musculoskeletal: Positive for arthralgias, back pain and gait problem.  Allergic/Immunologic: Negative for environmental allergies.    Past Medical History:  Diagnosis Date  . Acid reflux    mild-states if doesnt chew food completely he begins to cough- states has weakness in muscles due to ilieus- has been doing exreci this  . Adynamic ileus ([place]) 12/2014   ? anesthesia related per pt  . BPH (benign prostatic hyperplasia)   . Complication of anesthesia    10/11/15- per pt "had [place]- in 11/16-related it to possibly anesthesia"  . H/O seasonal allergies   . Hyperlipidemia   . Hypertension     Past Surgical History:  Procedure Laterality Date  . APPENDECTOMY     approx 30 years ago/lwb  . CATARACT EXTRACTION W/PHACO Left 09/11/2012   Procedure: CATARACT EXTRACTION PHACO AND INTRAOCULAR LENS PLACEMENT (IOC);  Surgeon: Tonny Branch, MD;  Location: [facility];  Service: Ophthalmology;  Laterality: Left;  CDE: 19.52  . HIP ARTHROPLASTY Right 12/06/2014   Procedure: PARTIAL RIGHT HIP REPLACEMENT;  Surgeon: Carole Civil, MD;  Location: [facility];  Service: Orthopedics;  Laterality: Right;  . LUMBAR LAMINECTOMY/DECOMPRESSION MICRODISCECTOMY N/A 10/19/2015   Procedure: CENTRAL DECOMPRESSION LUMBAR LAMINECTOMY FOR SPINAL STENOSIS, L3,L4FORAMINOTOMY BILATERAL L3,L4 NERVE ROOT;  Surgeon: Latanya Maudlin, MD;  Location: [facility];  Service: Orthopedics;  Laterality: N/A;  . UMBILICAL HERNIA REPAIR     Approx 10 years ago/lwb  . ureteral stone extraction      Family History  Problem Relation Age of Onset  . Coronary artery disease      no family history of premature CAD    Social History Social History  Substance Use Topics  . Smoking status: Former Smoker    Types: Pipe  . Smokeless tobacco: Never Used  . Alcohol use Yes     Comment: occasional social drink/lwb    Allergies  Allergen Reactions  . Robaxin [Methocarbamol] Other (See Comments)    "made me crazy"  . Robaxin [Methocarbamol] Other (See Comments)    Mental confusion    Current Outpatient Prescriptions  Medication Sig Dispense Refill  . aspirin EC 81 MG tablet Take 81 mg by mouth daily.    . cholecalciferol (VITAMIN D) 1000 units tablet Take 5,000 Units by mouth daily.    . cloNIDine (CATAPRES - DOSED IN MG/24 HR) 0.1 mg/24hr patch Place 0.1 mg onto the skin once a week. TUESDAY    . HYDROcodone-acetaminophen (NORCO/VICODIN) 5-325 MG tablet Take 1-2 tablets by mouth every 4 (four) hours as needed (mild pain). 60 tablet 0  . rosuvastatin (CRESTOR) 20 MG tablet Take 20 mg by mouth daily.  No current facility-administered medications for this visit.      Physical Exam  There were no vitals taken for this visit.  Constitutional: overall normal hygiene, normal nutrition, well developed, normal grooming, normal body habitus. Assistive device:cane  Musculoskeletal: gait and station Limp right, muscle tone and strength are normal, no tremors or atrophy is present.  .  Neurological: coordination overall normal.  Deep tendon reflex/nerve stretch intact.  Sensation normal.  Cranial nerves II-XII intact.   Skin:   Normal overall no scars, lesions, ulcers or rashes. No  psoriasis.  Psychiatric: Alert and oriented x 3.  Recent memory intact, remote memory unclear.  Normal mood and affect. Well groomed.  Good eye contact.  Cardiovascular: overall no swelling, no varicosities, no edema bilaterally, normal temperatures of the legs and arms, no clubbing, cyanosis and good capillary refill.  Lymphatic: palpation is normal.  The right great toe nail area looks good.  The wound has healed well with no swelling or redness.  The left great toe has nail deformity.   Procedure note:  After permission from the patient and sterile prep, the left great toe had digital block with 1% xylocaine.  After anesthesia was obtained, I removed the great toe nail.  He tolerated it well.  A sterile dressing was applied.  The patient has been educated about the nature of the problem(s) and counseled on treatment options.  The patient appeared to understand what I have discussed and is in agreement with it.  Encounter Diagnoses  Name Primary?  . Ingrown nail of great toe of right foot Yes  . Ingrown nail of great toe of left foot     PLAN Call if any problems.  Precautions discussed.  Continue current medications.   Return to clinic Tuesday, April 24th   Electronically Signed Sanjuana Kava, MD 4/19/201810:43 AM

## 2016-05-01 ENCOUNTER — Ambulatory Visit: Payer: Medicare Other | Admitting: Orthopaedic Surgery

## 2016-05-02 ENCOUNTER — Ambulatory Visit (INDEPENDENT_AMBULATORY_CARE_PROVIDER_SITE_OTHER): Payer: Medicare Other | Admitting: Orthopaedic Surgery

## 2016-05-02 VITALS — BP 135/77 | HR 83 | Temp 97.3°F | Ht 73.0 in | Wt 166.0 lb

## 2016-05-02 DIAGNOSIS — G8929 Other chronic pain: Secondary | ICD-10-CM

## 2016-05-02 DIAGNOSIS — M25511 Pain in right shoulder: Secondary | ICD-10-CM

## 2016-05-02 DIAGNOSIS — L600 Ingrowing nail: Secondary | ICD-10-CM

## 2016-05-02 NOTE — Progress Notes (Signed)
Patient PY:KDXIP Dominic Hodges, male DOB:05/26/1927, 83 y.o. JAS:[id number]  Chief Complaint  Patient presents with  . Follow-up    toe left and Rt great toesand Rt shoulder pain    HPI  Dominic Hodges is a 83 y.o. male who has had nail removal on both great toes.  The right was done first and is doing well.  The left was done last week and is doing well.  He has no problem.  He has recurrent pain of the right shoulder and would like an injection.  He has no new trauma, redness or swelling.  He has no paresthesias. HPI  Body mass index is 21.9 kg/m.  ROS  Review of Systems  Past Medical History:  Diagnosis Date  . Acid reflux    mild-states if doesnt chew food completely he begins to cough- states has weakness in muscles due to ilieus- has been doing exreci this  . Adynamic ileus ([place]) 12/2014   ? anesthesia related per pt  . BPH (benign prostatic hyperplasia)   . Complication of anesthesia    10/11/15- per pt "had [place]- in 11/16-related it to possibly anesthesia"  . H/O seasonal allergies   . Hyperlipidemia   . Hypertension     Past Surgical History:  Procedure Laterality Date  . APPENDECTOMY     approx 30 years ago/lwb  . CATARACT EXTRACTION W/PHACO Left 09/11/2012   Procedure: CATARACT EXTRACTION PHACO AND INTRAOCULAR LENS PLACEMENT (IOC);  Surgeon: Tonny Branch, MD;  Location: [facility];  Service: Ophthalmology;  Laterality: Left;  CDE: 19.52  . HIP ARTHROPLASTY Right 12/06/2014   Procedure: PARTIAL RIGHT HIP REPLACEMENT;  Surgeon: Carole Civil, MD;  Location: [facility];  Service: Orthopedics;  Laterality: Right;  . LUMBAR LAMINECTOMY/DECOMPRESSION MICRODISCECTOMY N/A 10/19/2015   Procedure: CENTRAL DECOMPRESSION LUMBAR LAMINECTOMY FOR SPINAL STENOSIS, L3,L4FORAMINOTOMY BILATERAL L3,L4 NERVE ROOT;  Surgeon: Latanya Maudlin, MD;  Location: [facility];  Service: Orthopedics;  Laterality: N/A;  . UMBILICAL HERNIA REPAIR     Approx 10 years  ago/lwb  . ureteral stone extraction      Family History  Problem Relation Age of Onset  . Coronary artery disease      no family history of premature CAD    Social History Social History  Substance Use Topics  . Smoking status: Former Smoker    Types: Pipe  . Smokeless tobacco: Never Used  . Alcohol use Yes     Comment: occasional social drink/lwb    Allergies  Allergen Reactions  . Robaxin [Methocarbamol] Other (See Comments)    "made me crazy"  . Robaxin [Methocarbamol] Other (See Comments)    Mental confusion    Current Outpatient Prescriptions  Medication Sig Dispense Refill  . aspirin EC 81 MG tablet Take 81 mg by mouth daily.    . cholecalciferol (VITAMIN D) 1000 units tablet Take 5,000 Units by mouth daily.    . cloNIDine (CATAPRES - DOSED IN MG/24 HR) 0.1 mg/24hr patch Place 0.1 mg onto the skin once a week. TUESDAY    . HYDROcodone-acetaminophen (NORCO/VICODIN) 5-325 MG tablet Take 1-2 tablets by mouth every 4 (four) hours as needed (mild pain). 60 tablet 0  . rosuvastatin (CRESTOR) 20 MG tablet Take 20 mg by mouth daily.       No current facility-administered medications for this visit.      Physical Exam  Blood pressure 135/77, pulse 83, temperature 97.3 F (36.3 C), height 6\' 1"  (1.854 m), weight  166 lb (75.3 kg).  Constitutional: overall normal hygiene, normal nutrition, well developed, normal grooming, normal body habitus. Assistive device:cane  Musculoskeletal: gait and station Limp right, muscle tone and strength are normal, no tremors or atrophy is present.  .  Neurological: coordination overall normal.  Deep tendon reflex/nerve stretch intact.  Sensation normal.  Cranial nerves II-XII intact.   Skin:   Normal overall no scars, lesions, ulcers or rashes. No psoriasis.  Psychiatric: Alert and oriented x 3.  Recent memory intact, remote memory unclear.  Normal mood and affect. Well groomed.  Good eye contact.  Cardiovascular: overall no  swelling, no varicosities, no edema bilaterally, normal temperatures of the legs and arms, no clubbing, cyanosis and good capillary refill.  Lymphatic: palpation is normal.  The nailbed area of the great toe where the nail was removed looks good on the left and right feet.  He has no redness or discharge.  Examination of right Upper Extremity is done.  Inspection:   Overall:  Elbow non-tender without crepitus or defects, forearm non-tender without crepitus or defects, wrist non-tender without crepitus or defects, hand non-tender.    Shoulder: with glenohumeral joint tenderness, without effusion.   Upper arm: without swelling and tenderness   Range of motion:   Overall:  Full range of motion of the elbow, full range of motion of wrist and full range of motion in fingers.   Shoulder:  right  145 degrees forward flexion; 120 degrees abduction; 35 degrees internal rotation, 35 degrees external rotation, 15 degrees extension, 40 degrees adduction.   Stability:   Overall:  Shoulder, elbow and wrist stable   Strength and Tone:   Overall full shoulder muscles strength, full upper arm strength and normal upper arm bulk and tone.   The patient has been educated about the nature of the problem(s) and counseled on treatment options.  The patient appeared to understand what I have discussed and is in agreement with it.  Encounter Diagnoses  Name Primary?  . Ingrown nail of great toe of left foot Yes  . Chronic pain in right shoulder     PROCEDURE NOTE:  The patient request injection, verbal consent was obtained.  The right shoulder was prepped appropriately after time out was performed.   Sterile technique was observed and injection of 1 cc of Depo-Medrol 40 mg with several cc's of plain xylocaine. Anesthesia was provided by ethyl chloride and a 20-gauge needle was used to inject the shoulder area. A posterior approach was used.  The injection was tolerated well.  A band aid dressing was  applied.  The patient was advised to apply ice later today and tomorrow to the injection sight as needed.   PLAN Call if any problems.  Precautions discussed.  Continue current medications.   Return to clinic prn   Electronically Signed Sanjuana Kava, MD 4/25/20189:37 AM

## 2016-05-17 DIAGNOSIS — K130 Diseases of lips: Secondary | ICD-10-CM | POA: Diagnosis not present

## 2016-05-17 DIAGNOSIS — X32XXXD Exposure to sunlight, subsequent encounter: Secondary | ICD-10-CM | POA: Diagnosis not present

## 2016-05-17 DIAGNOSIS — D0439 Carcinoma in situ of skin of other parts of face: Secondary | ICD-10-CM | POA: Diagnosis not present

## 2016-05-17 DIAGNOSIS — L570 Actinic keratosis: Secondary | ICD-10-CM | POA: Diagnosis not present

## 2016-05-17 DIAGNOSIS — L821 Other seborrheic keratosis: Secondary | ICD-10-CM | POA: Diagnosis not present

## 2016-08-15 ENCOUNTER — Emergency Department (HOSPITAL_COMMUNITY): Payer: Medicare Other

## 2016-08-15 ENCOUNTER — Encounter (HOSPITAL_COMMUNITY): Payer: Self-pay | Admitting: Emergency Medicine

## 2016-08-15 ENCOUNTER — Emergency Department (HOSPITAL_COMMUNITY)
Admission: EM | Admit: 2016-08-15 | Discharge: 2016-08-15 | Disposition: A | Discharge status: Home / Self Care | Payer: Medicare Other | Attending: Emergency Medicine | Admitting: Emergency Medicine

## 2016-08-15 DIAGNOSIS — S0992XA Unspecified injury of nose, initial encounter: Secondary | ICD-10-CM | POA: Diagnosis not present

## 2016-08-15 DIAGNOSIS — S0121XA Laceration without foreign body of nose, initial encounter: Secondary | ICD-10-CM

## 2016-08-15 DIAGNOSIS — Z7982 Long term (current) use of aspirin: Secondary | ICD-10-CM | POA: Diagnosis not present

## 2016-08-15 DIAGNOSIS — Z23 Encounter for immunization: Secondary | ICD-10-CM | POA: Insufficient documentation

## 2016-08-15 DIAGNOSIS — I10 Essential (primary) hypertension: Secondary | ICD-10-CM | POA: Diagnosis not present

## 2016-08-15 DIAGNOSIS — Z87891 Personal history of nicotine dependence: Secondary | ICD-10-CM | POA: Diagnosis not present

## 2016-08-15 DIAGNOSIS — Z79899 Other long term (current) drug therapy: Secondary | ICD-10-CM | POA: Insufficient documentation

## 2016-08-15 DIAGNOSIS — Y998 Other external cause status: Secondary | ICD-10-CM | POA: Insufficient documentation

## 2016-08-15 DIAGNOSIS — Y9389 Activity, other specified: Secondary | ICD-10-CM | POA: Diagnosis not present

## 2016-08-15 DIAGNOSIS — S0181XA Laceration without foreign body of other part of head, initial encounter: Secondary | ICD-10-CM | POA: Diagnosis not present

## 2016-08-15 DIAGNOSIS — W0110XA Fall on same level from slipping, tripping and stumbling with subsequent striking against unspecified object, initial encounter: Secondary | ICD-10-CM | POA: Diagnosis not present

## 2016-08-15 DIAGNOSIS — Y929 Unspecified place or not applicable: Secondary | ICD-10-CM | POA: Insufficient documentation

## 2016-08-15 DIAGNOSIS — W19XXXA Unspecified fall, initial encounter: Secondary | ICD-10-CM

## 2016-08-15 MED ORDER — TETANUS-DIPHTH-ACELL PERTUSSIS 5-2.5-18.5 LF-MCG/0.5 IM SUSP
0.5000 mL | Freq: Once | INTRAMUSCULAR | Status: AC
  Administered 2016-08-15: 0.5 mL via INTRAMUSCULAR
  Filled 2016-08-15: qty 0.5

## 2016-08-15 MED ORDER — LIDOCAINE HCL (PF) 1 % IJ SOLN
5.0000 mL | Freq: Once | INTRAMUSCULAR | Status: AC
  Administered 2016-08-15: 5 mL via INTRADERMAL

## 2016-08-15 NOTE — Discharge Instructions (Signed)
Suture removal in 6 days.   Ice to area to help with swelling.   Follow up with Dr. Benjamine Mola ENT if any complications

## 2016-08-15 NOTE — ED Provider Notes (Signed)
  Face-to-face evaluation   History: He complains of trip and fall injuring nose.  Feels like his nose is crooked.  Physical exam: Alert elderly man, who is comfortable.  Laceration over bridge of nose, has been sutured, and bleeding has been controlled.  Small amount of blood in the right nares with septal deformity, which appears old, and is not tender.  Medical screening examination/treatment/procedure(s) were conducted as a shared visit with non-physician practitioner(s) and myself.  I personally evaluated the patient during the encounter   Daleen Bo, MD 08/16/16 1256

## 2016-08-15 NOTE — ED Provider Notes (Signed)
[facility] DEPT Provider Note   CSN: [id number] Arrival date & time: 08/15/16  1523     History   Chief Complaint Chief Complaint  Patient presents with  . Fall    nose laceration    HPI Dominic Hodges is a 83 y.o. male.  The history is provided by the patient. No language interpreter was used.  Fall  This is a new problem. The current episode started 1 to 2 hours ago. The problem occurs constantly. The problem has not changed since onset.Pertinent negatives include no headaches. Nothing aggravates the symptoms. Nothing relieves the symptoms. He has tried nothing for the symptoms. The treatment provided no relief.  Pt was picking up dog treats and stumbled falling on cane. Pt reports he hit his nose.  Pt reports he has a laceration that he thinks needs sutures.   Past Medical History:  Diagnosis Date  . Acid reflux    mild-states if doesnt chew food completely he begins to cough- states has weakness in muscles due to ilieus- has been doing exreci this  . Adynamic ileus ([place]) 12/2014   ? anesthesia related per pt  . BPH (benign prostatic hyperplasia)   . Complication of anesthesia    10/11/15- per pt "had [place]- in 11/16-related it to possibly anesthesia"  . H/O seasonal allergies   . Hyperlipidemia   . Hypertension     Patient Active Problem List   Diagnosis Date Noted  . Spinal stenosis, lumbar region, with neurogenic claudication 10/19/2015  . Closed right hip fracture ([place]) 12/05/2014  . Closed hip fracture ([place]) 12/05/2014  . Hypertension 12/05/2014  . BPH (benign prostatic hyperplasia) 12/05/2014  . Hyperlipidemia 12/05/2014  . Essential hypertension, benign 06/01/2010  . NUMBNESS/TINGLING 11/20/2006  . BACK PAIN 11/20/2006    Past Surgical History:  Procedure Laterality Date  . APPENDECTOMY     approx 30 years ago/lwb  . CATARACT EXTRACTION W/PHACO Left 09/11/2012   Procedure: CATARACT EXTRACTION PHACO AND INTRAOCULAR  LENS PLACEMENT (IOC);  Surgeon: Tonny Branch, MD;  Location: [facility];  Service: Ophthalmology;  Laterality: Left;  CDE: 19.52  . HIP ARTHROPLASTY Right 12/06/2014   Procedure: PARTIAL RIGHT HIP REPLACEMENT;  Surgeon: Carole Civil, MD;  Location: [facility];  Service: Orthopedics;  Laterality: Right;  . LUMBAR LAMINECTOMY/DECOMPRESSION MICRODISCECTOMY N/A 10/19/2015   Procedure: CENTRAL DECOMPRESSION LUMBAR LAMINECTOMY FOR SPINAL STENOSIS, L3,L4FORAMINOTOMY BILATERAL L3,L4 NERVE ROOT;  Surgeon: Latanya Maudlin, MD;  Location: [facility];  Service: Orthopedics;  Laterality: N/A;  . UMBILICAL HERNIA REPAIR     Approx 10 years ago/lwb  . ureteral stone extraction         Home Medications    Prior to Admission medications   Medication Sig Start Date End Date Taking? Authorizing Provider  aspirin EC 81 MG tablet Take 81 mg by mouth daily.    [provider]  cholecalciferol (VITAMIN D) 1000 units tablet Take 5,000 Units by mouth daily.    [provider]  cloNIDine (CATAPRES - DOSED IN MG/24 HR) 0.1 mg/24hr patch Place 0.1 mg onto the skin once a week. TUESDAY    [provider]  HYDROcodone-acetaminophen (NORCO/VICODIN) 5-325 MG tablet Take 1-2 tablets by mouth every 4 (four) hours as needed (mild pain). 10/19/15   Latanya Maudlin, MD  rosuvastatin (CRESTOR) 20 MG tablet Take 20 mg by mouth daily.      [provider]    Family History Family History  Problem Relation Age of Onset  .  Coronary artery disease Unknown        no family history of premature CAD    Social History Social History  Substance Use Topics  . Smoking status: Former Smoker    Types: Pipe  . Smokeless tobacco: Never Used  . Alcohol use Yes     Comment: occasional social drink/lwb     Allergies   Robaxin [methocarbamol] and Robaxin [methocarbamol]   Review of Systems Review of Systems  Neurological: Negative for headaches.  All other systems reviewed and are  negative.    Physical Exam Updated Vital Signs BP (!) 159/90 (BP Location: Left Arm)   Pulse 65   Temp 97.7 F (36.5 C) (Oral)   Resp 18   Ht 6\' 1"  (1.854 m)   Wt 78 kg (172 lb)   SpO2 95%   BMI 22.69 kg/m   Physical Exam  Constitutional: He is oriented to person, place, and time. He appears well-developed and well-nourished.  HENT:  Head: Normocephalic.  Right Ear: External ear normal.  Left Ear: External ear normal.  49mm laceration nose, gapping   Cardiovascular: Normal rate.   Pulmonary/Chest: Effort normal.  Musculoskeletal: Normal range of motion.  Neurological: He is alert and oriented to person, place, and time.  Skin: Skin is warm.  Psychiatric: He has a normal mood and affect.  Nursing note and vitals reviewed.    ED Treatments / Results  Labs (all labs ordered are listed, but only abnormal results are displayed) Labs Reviewed - No data to display  EKG  EKG Interpretation None       Radiology Dg Nasal Bones  Result Date: 08/15/2016 CLINICAL DATA:  Fall with nasal injury.  Initial encounter. EXAM: NASAL BONES - 3+ VIEW COMPARISON:  None. FINDINGS: No acute nasal bone fractures identified. The nasal septum is mildly deviated to the left. Other surrounding visualized facial bones and paranasal sinuses are unremarkable. IMPRESSION: No evidence of acute nasal bone fracture. Electronically Signed   By: Aletta Edouard M.D.   On: 08/15/2016 18:41    Procedures .Marland KitchenLaceration Repair Date/Time: 08/15/2016 11:32 PM Performed by: Fransico Meadow Authorized by: Fransico Meadow   Consent:    Consent obtained:  Verbal   Consent given by:  Patient   Risks discussed:  Infection   Alternatives discussed:  No treatment Anesthesia (see MAR for exact dosages):    Anesthesia method:  Local infiltration   Local anesthetic:  Lidocaine 1% w/o epi Laceration details:    Location: nose.   Length (cm):  0.7   Depth (mm):  2 Repair type:    Repair type:   Simple Pre-procedure details:    Preparation:  Patient was prepped and draped in usual sterile fashion Treatment:    Area cleansed with:  Betadine   Amount of cleaning:  Standard   Irrigation solution:  Sterile saline   Visualized foreign bodies/material removed: no   Skin repair:    Repair method:  Sutures   Suture size:  6-0   Suture material:  Prolene Approximation:    Approximation:  Loose Post-procedure details:    Dressing:  Antibiotic ointment and non-adherent dressing   Patient tolerance of procedure:  Tolerated well, no immediate complications   (including critical care time)  Medications Ordered in ED Medications  lidocaine (PF) (XYLOCAINE) 1 % injection 5 mL (5 mLs Intradermal Given During Downtime 08/15/16 1801)  Tdap (BOOSTRIX) injection 0.5 mL (0.5 mLs Intramuscular Given 08/15/16 1745)     Initial Impression / Assessment  and Plan / ED Course  I have reviewed the triage vital signs and the nursing notes.  Pertinent labs & imaging results that were available during my care of the patient were reviewed by me and considered in my medical decision making (see chart for details).      Xray no fracture.  Dr. Eulis Foster in to see.  Pt advised tylenol.  Suture removal in 6-7 days. Final Clinical Impressions(s) / ED Diagnoses   Final diagnoses:  Fall, initial encounter  Laceration of nose, initial encounter    New Prescriptions Discharge Medication List as of 08/15/2016  6:48 PM    An After Visit Summary was printed and given to the patient.   Sidney Ace 08/15/16 2336    Daleen Bo, MD 08/16/16 1256

## 2016-08-15 NOTE — ED Triage Notes (Signed)
Pt tripped on cane today and fell face first onto floor. Laceration and swelling noted to bridge of nose. No LOC.

## 2016-08-22 ENCOUNTER — Encounter (HOSPITAL_COMMUNITY): Payer: Self-pay | Admitting: Cardiology

## 2016-08-22 ENCOUNTER — Emergency Department (HOSPITAL_COMMUNITY)
Admission: EM | Admit: 2016-08-22 | Discharge: 2016-08-22 | Disposition: A | Discharge status: Home / Self Care | Payer: Medicare Other | Attending: Emergency Medicine | Admitting: Emergency Medicine

## 2016-08-22 DIAGNOSIS — X58XXXD Exposure to other specified factors, subsequent encounter: Secondary | ICD-10-CM | POA: Diagnosis not present

## 2016-08-22 DIAGNOSIS — Z4800 Encounter for change or removal of nonsurgical wound dressing: Secondary | ICD-10-CM | POA: Diagnosis not present

## 2016-08-22 DIAGNOSIS — S0121XD Laceration without foreign body of nose, subsequent encounter: Secondary | ICD-10-CM | POA: Insufficient documentation

## 2016-08-22 DIAGNOSIS — I10 Essential (primary) hypertension: Secondary | ICD-10-CM | POA: Diagnosis not present

## 2016-08-22 DIAGNOSIS — Z79899 Other long term (current) drug therapy: Secondary | ICD-10-CM | POA: Insufficient documentation

## 2016-08-22 DIAGNOSIS — Z4802 Encounter for removal of sutures: Secondary | ICD-10-CM | POA: Diagnosis not present

## 2016-08-22 DIAGNOSIS — Z87891 Personal history of nicotine dependence: Secondary | ICD-10-CM | POA: Diagnosis not present

## 2016-08-22 DIAGNOSIS — Z7982 Long term (current) use of aspirin: Secondary | ICD-10-CM | POA: Diagnosis not present

## 2016-08-22 NOTE — ED Provider Notes (Signed)
[facility] DEPT Provider Note   CSN: [id number] Arrival date & time: 08/22/16  1537     History   Chief Complaint Chief Complaint  Patient presents with  . Suture / Staple Removal    HPI Dominic Hodges is a 83 y.o. male.  Pt presents to the ED today for suture removal.  Pt had stitches placed to his nose on 8/9.      Past Medical History:  Diagnosis Date  . Acid reflux    mild-states if doesnt chew food completely he begins to cough- states has weakness in muscles due to ilieus- has been doing exreci this  . Adynamic ileus ([place]) 12/2014   ? anesthesia related per pt  . BPH (benign prostatic hyperplasia)   . Complication of anesthesia    10/11/15- per pt "had [place]- in 11/16-related it to possibly anesthesia"  . H/O seasonal allergies   . Hyperlipidemia   . Hypertension     Patient Active Problem List   Diagnosis Date Noted  . Spinal stenosis, lumbar region, with neurogenic claudication 10/19/2015  . Closed right hip fracture ([place]) 12/05/2014  . Closed hip fracture ([place]) 12/05/2014  . Hypertension 12/05/2014  . BPH (benign prostatic hyperplasia) 12/05/2014  . Hyperlipidemia 12/05/2014  . Essential hypertension, benign 06/01/2010  . NUMBNESS/TINGLING 11/20/2006  . BACK PAIN 11/20/2006    Past Surgical History:  Procedure Laterality Date  . APPENDECTOMY     approx 30 years ago/lwb  . CATARACT EXTRACTION W/PHACO Left 09/11/2012   Procedure: CATARACT EXTRACTION PHACO AND INTRAOCULAR LENS PLACEMENT (IOC);  Surgeon: Tonny Branch, MD;  Location: [facility];  Service: Ophthalmology;  Laterality: Left;  CDE: 19.52  . HIP ARTHROPLASTY Right 12/06/2014   Procedure: PARTIAL RIGHT HIP REPLACEMENT;  Surgeon: Carole Civil, MD;  Location: [facility];  Service: Orthopedics;  Laterality: Right;  . LUMBAR LAMINECTOMY/DECOMPRESSION MICRODISCECTOMY N/A 10/19/2015   Procedure: CENTRAL DECOMPRESSION LUMBAR LAMINECTOMY FOR SPINAL STENOSIS,  L3,L4FORAMINOTOMY BILATERAL L3,L4 NERVE ROOT;  Surgeon: Latanya Maudlin, MD;  Location: [facility];  Service: Orthopedics;  Laterality: N/A;  . UMBILICAL HERNIA REPAIR     Approx 10 years ago/lwb  . ureteral stone extraction         Home Medications    Prior to Admission medications   Medication Sig Start Date End Date Taking? Authorizing Provider  aspirin EC 81 MG tablet Take 81 mg by mouth daily.    [provider]  cholecalciferol (VITAMIN D) 1000 units tablet Take 5,000 Units by mouth daily.    [provider]  cloNIDine (CATAPRES - DOSED IN MG/24 HR) 0.1 mg/24hr patch Place 0.1 mg onto the skin once a week. TUESDAY    [provider]  HYDROcodone-acetaminophen (NORCO/VICODIN) 5-325 MG tablet Take 1-2 tablets by mouth every 4 (four) hours as needed (mild pain). 10/19/15   Latanya Maudlin, MD  rosuvastatin (CRESTOR) 20 MG tablet Take 20 mg by mouth daily.      [provider]    Family History Family History  Problem Relation Age of Onset  . Coronary artery disease Unknown        no family history of premature CAD    Social History Social History  Substance Use Topics  . Smoking status: Former Smoker    Types: Pipe  . Smokeless tobacco: Never Used  . Alcohol use Yes     Comment: occasional social drink/lwb     Allergies   Robaxin [methocarbamol] and Robaxin [methocarbamol]  Review of Systems Review of Systems  Skin: Positive for wound.  All other systems reviewed and are negative.    Physical Exam Updated Vital Signs BP 133/63 (BP Location: Right Arm)   Pulse 74   Temp 98.3 F (36.8 C) (Oral)   Resp 14   Ht 6\' 1"  (1.854 m)   Wt 77.1 kg (170 lb)   SpO2 95%   BMI 22.43 kg/m   Physical Exam  Constitutional: He appears well-developed and well-nourished.  HENT:  Head: Normocephalic.    Right Ear: External ear normal.  Left Ear: External ear normal.  Mouth/Throat: Oropharynx is clear and moist.  Eyes: Pupils are  equal, round, and reactive to light. Conjunctivae and EOM are normal.  Neck: Normal range of motion. Neck supple.  Cardiovascular: Normal rate, regular rhythm, normal heart sounds and intact distal pulses.   Pulmonary/Chest: Effort normal and breath sounds normal.  Nursing note and vitals reviewed.    ED Treatments / Results  Labs (all labs ordered are listed, but only abnormal results are displayed) Labs Reviewed - No data to display  EKG  EKG Interpretation None       Radiology No results found.  Procedures .Suture Removal Date/Time: 08/22/2016 4:12 PM Performed by: Isla Pence Authorized by: Isla Pence   Consent:    Consent obtained:  Verbal   Consent given by:  Patient   Risks discussed:  Bleeding, pain and wound separation   Alternatives discussed:  No treatment Location:    Location:  Head/neck   Head/neck location:  Nose Procedure details:    Wound appearance:  No signs of infection, good wound healing and clean   Number of sutures removed:  4 Post-procedure details:    Post-removal:  Antibiotic ointment applied and dressing applied   Patient tolerance of procedure:  Tolerated well, no immediate complications   (including critical care time)  Medications Ordered in ED Medications - No data to display   Initial Impression / Assessment and Plan / ED Course  I have reviewed the triage vital signs and the nursing notes.  Pertinent labs & imaging results that were available during my care of the patient were reviewed by me and considered in my medical decision making (see chart for details).      Final Clinical Impressions(s) / ED Diagnoses   Final diagnoses:  Visit for suture removal    New Prescriptions New Prescriptions   No medications on file     Isla Pence, MD 08/25/16 [phone]

## 2016-08-22 NOTE — ED Triage Notes (Signed)
Here for suture removal. Pt stable

## 2016-08-27 DIAGNOSIS — D0439 Carcinoma in situ of skin of other parts of face: Secondary | ICD-10-CM | POA: Diagnosis not present

## 2016-08-27 DIAGNOSIS — C44329 Squamous cell carcinoma of skin of other parts of face: Secondary | ICD-10-CM | POA: Diagnosis not present

## 2016-10-18 DIAGNOSIS — Z85828 Personal history of other malignant neoplasm of skin: Secondary | ICD-10-CM | POA: Diagnosis not present

## 2016-10-18 DIAGNOSIS — D0461 Carcinoma in situ of skin of right upper limb, including shoulder: Secondary | ICD-10-CM | POA: Diagnosis not present

## 2016-10-18 DIAGNOSIS — D0462 Carcinoma in situ of skin of left upper limb, including shoulder: Secondary | ICD-10-CM | POA: Diagnosis not present

## 2016-10-18 DIAGNOSIS — Z08 Encounter for follow-up examination after completed treatment for malignant neoplasm: Secondary | ICD-10-CM | POA: Diagnosis not present

## 2016-11-21 DIAGNOSIS — Z23 Encounter for immunization: Secondary | ICD-10-CM | POA: Diagnosis not present

## 2017-01-07 DIAGNOSIS — H26492 Other secondary cataract, left eye: Secondary | ICD-10-CM | POA: Diagnosis not present

## 2017-01-08 IMAGING — DX DG HIP (WITH OR WITHOUT PELVIS) 2-3V*R*
3 series · 3 of 3 positions shown · non-contrast
Comparison: None.

CLINICAL DATA: Status post fall while doing yard work, with right
hip pain. Initial encounter.

EXAM:
DG HIP (WITH OR WITHOUT PELVIS) 2-3V RIGHT

[pelvis ap]
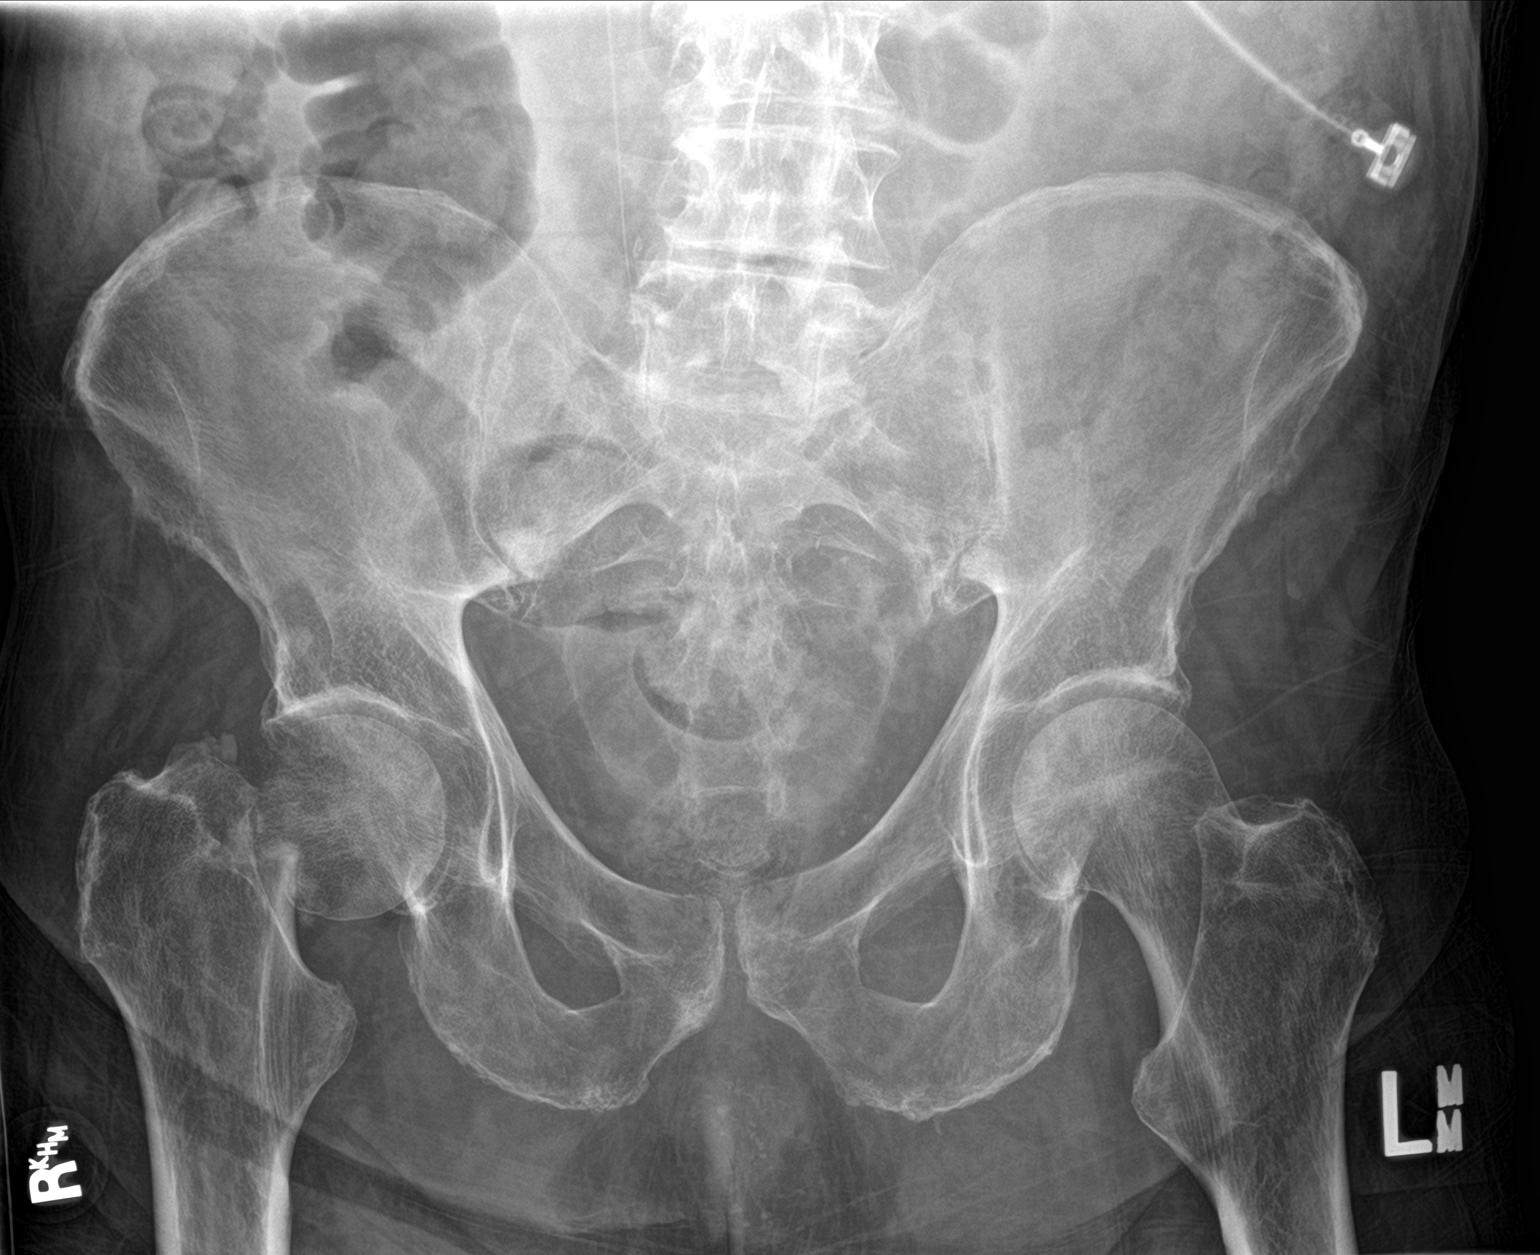

[hip ap]
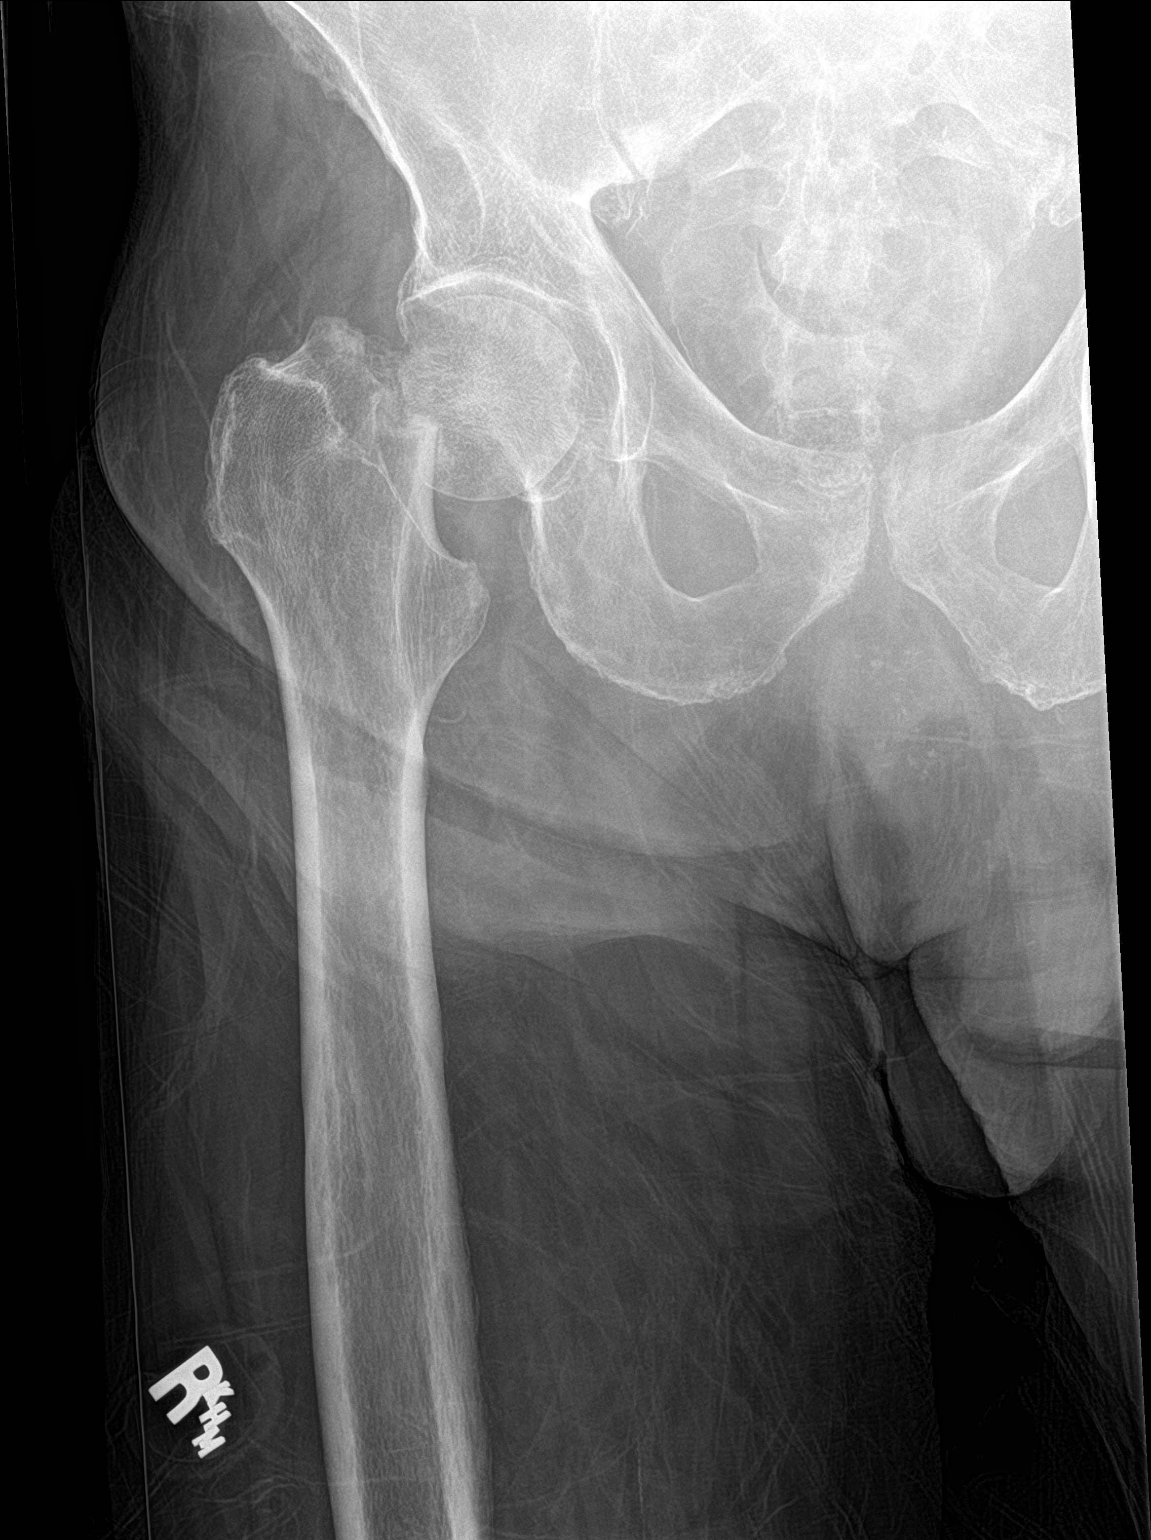

[hip lat]
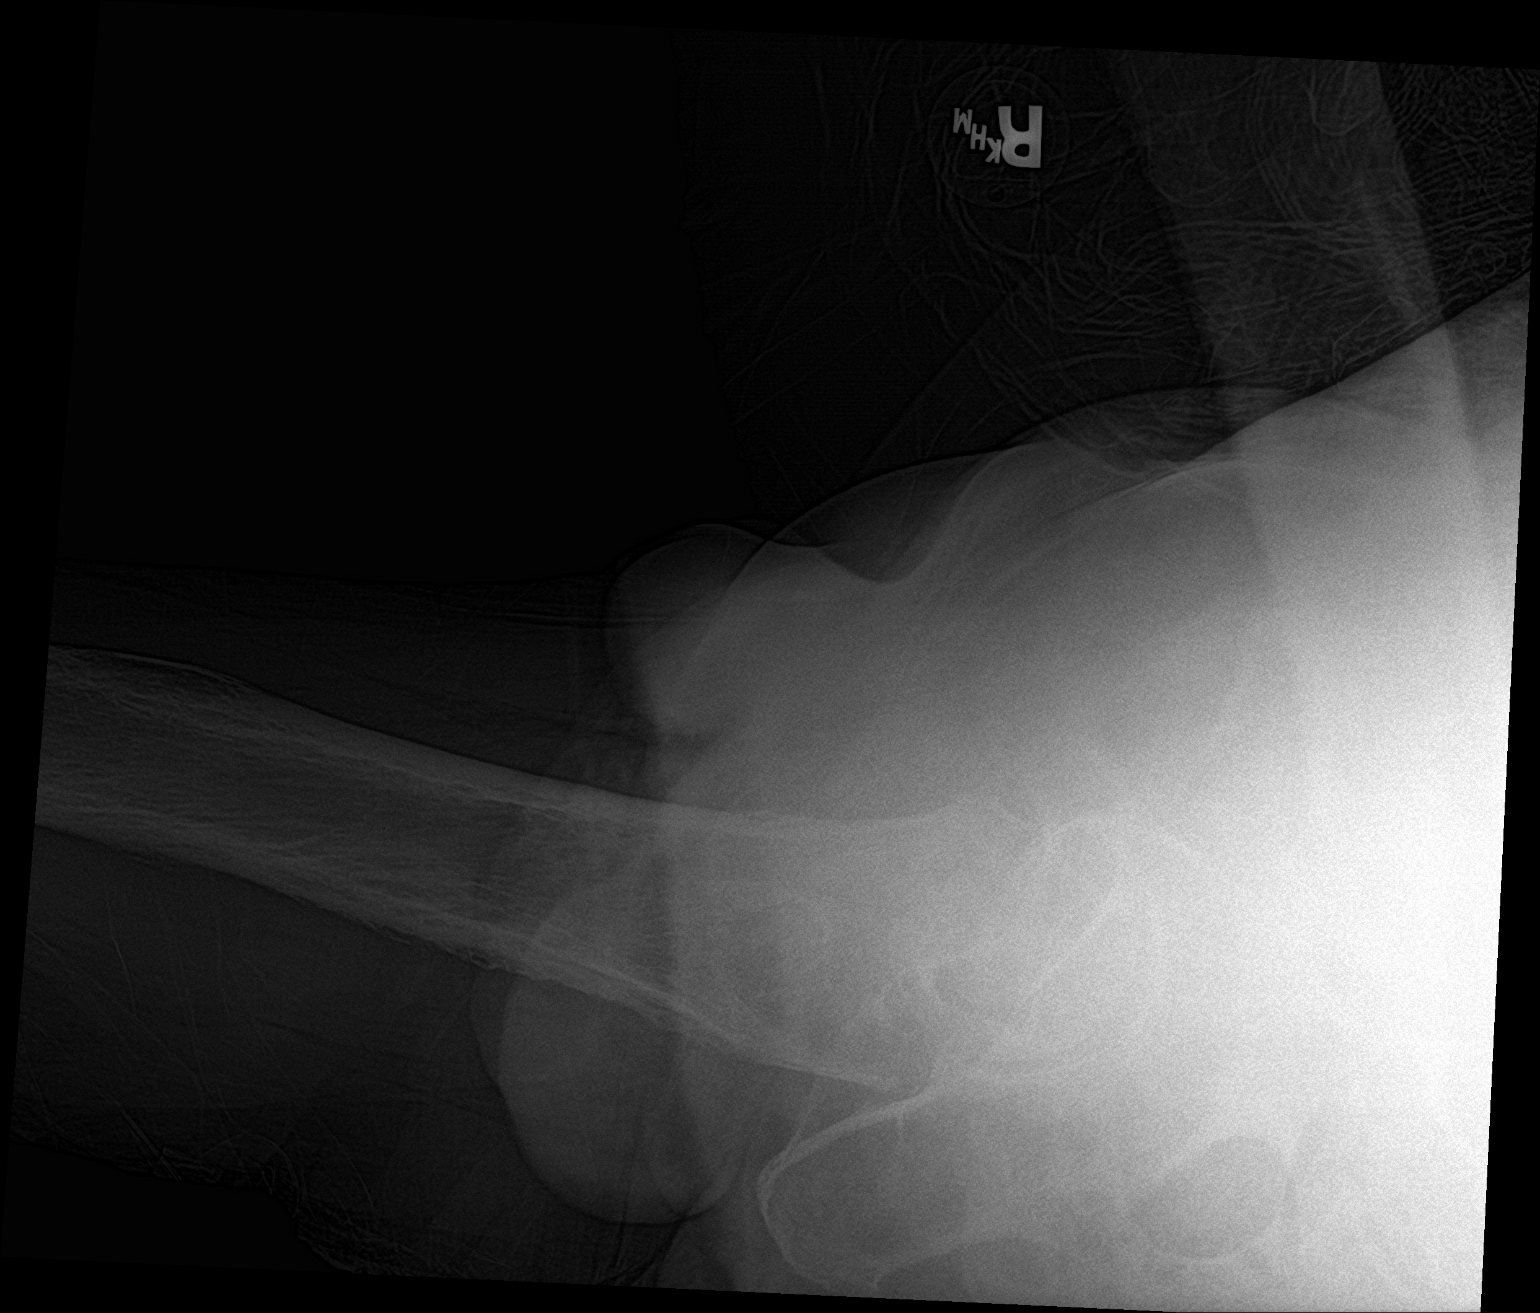

[3 of 3 positions shown; findings below may reference images not displayed]

FINDINGS: There is a mildly displaced subcapital fracture through the right
femoral neck. The right femoral head remains seated at the
acetabulum. There is mild superior displacement of the distal femur.

The left hip joint is grossly unremarkable. No significant
degenerative change is appreciated. The sacroiliac joints are
unremarkable in appearance.

The visualized bowel gas pattern is grossly unremarkable in
appearance.
IMPRESSION: Mildly displaced subcapital fracture through the right femoral neck.

## 2017-01-09 DIAGNOSIS — Z0000 Encounter for general adult medical examination without abnormal findings: Secondary | ICD-10-CM | POA: Diagnosis not present

## 2017-01-10 ENCOUNTER — Other Ambulatory Visit (HOSPITAL_COMMUNITY)
Admission: RE | Admit: 2017-01-10 | Discharge: 2017-01-10 | Disposition: A | Discharge status: Home / Self Care | Payer: Medicare Other | Source: Ambulatory Visit | Attending: Pulmonary Disease | Admitting: Pulmonary Disease

## 2017-01-10 DIAGNOSIS — E785 Hyperlipidemia, unspecified: Secondary | ICD-10-CM | POA: Insufficient documentation

## 2017-01-10 DIAGNOSIS — Z125 Encounter for screening for malignant neoplasm of prostate: Secondary | ICD-10-CM | POA: Diagnosis not present

## 2017-01-10 DIAGNOSIS — N401 Enlarged prostate with lower urinary tract symptoms: Secondary | ICD-10-CM | POA: Diagnosis not present

## 2017-01-10 DIAGNOSIS — I10 Essential (primary) hypertension: Secondary | ICD-10-CM | POA: Diagnosis not present

## 2017-01-10 DIAGNOSIS — I129 Hypertensive chronic kidney disease with stage 1 through stage 4 chronic kidney disease, or unspecified chronic kidney disease: Secondary | ICD-10-CM | POA: Insufficient documentation

## 2017-01-10 DIAGNOSIS — Z0000 Encounter for general adult medical examination without abnormal findings: Secondary | ICD-10-CM | POA: Diagnosis not present

## 2017-01-10 LAB — COMPREHENSIVE METABOLIC PANEL
ALK PHOS: 65 U/L (ref 38–126)
ALT: 15 U/L — AB (ref 17–63)
ANION GAP: 10 (ref 5–15)
AST: 21 U/L (ref 15–41)
Albumin: 4.5 g/dL (ref 3.5–5.0)
BILIRUBIN TOTAL: 1.2 mg/dL (ref 0.3–1.2)
BUN: 26 mg/dL — ABNORMAL HIGH (ref 6–20)
CALCIUM: 9.3 mg/dL (ref 8.9–10.3)
CO2: 26 mmol/L (ref 22–32)
CREATININE: 1.22 mg/dL (ref 0.61–1.24)
Chloride: 105 mmol/L (ref 101–111)
GFR, EST AFRICAN AMERICAN: 60 mL/min — AB (ref >60)
GFR, EST NON AFRICAN AMERICAN: 51 mL/min — AB (ref >60)
Glucose, Bld: 108 mg/dL — ABNORMAL HIGH (ref 65–99)
Potassium: 4.2 mmol/L (ref 3.5–5.1)
Sodium: 141 mmol/L (ref 135–145)
TOTAL PROTEIN: 7.2 g/dL (ref 6.5–8.1)

## 2017-01-10 LAB — PSA: Prostatic Specific Antigen: 6.18 ng/mL — ABNORMAL HIGH (ref 0.00–4.00)

## 2017-01-10 LAB — CBC
HEMATOCRIT: 45.5 % (ref 39.0–52.0)
HEMOGLOBIN: 14.8 g/dL (ref 13.0–17.0)
MCH: 30.2 pg (ref 26.0–34.0)
MCHC: 32.5 g/dL (ref 30.0–36.0)
MCV: 92.9 fL (ref 78.0–100.0)
Platelets: 197 10*3/uL (ref 150–400)
RBC: 4.9 MIL/uL (ref 4.22–5.81)
RDW: 13.1 % (ref 11.5–15.5)
WBC: 6.5 10*3/uL (ref 4.0–10.5)

## 2017-01-10 LAB — LIPID PANEL
CHOLESTEROL: 197 mg/dL (ref 0–200)
HDL: 49 mg/dL (ref >40)
LDL Cholesterol: 141 mg/dL — ABNORMAL HIGH (ref 0–99)
TRIGLYCERIDES: 35 mg/dL (ref <150)
Total CHOL/HDL Ratio: 4 RATIO
VLDL: 7 mg/dL (ref 0–40)

## 2017-01-10 LAB — TSH: TSH: 3.875 u[IU]/mL (ref 0.350–4.500)

## 2017-01-11 ENCOUNTER — Other Ambulatory Visit (HOSPITAL_COMMUNITY)
Admission: RE | Admit: 2017-01-11 | Discharge: 2017-01-11 | Disposition: A | Discharge status: Home / Self Care | Payer: Medicare Other | Source: Other Acute Inpatient Hospital | Attending: Pulmonary Disease | Admitting: Pulmonary Disease

## 2017-01-11 DIAGNOSIS — R739 Hyperglycemia, unspecified: Secondary | ICD-10-CM | POA: Diagnosis not present

## 2017-01-11 LAB — HEMOGLOBIN A1C
Hgb A1c MFr Bld: 5.6 % (ref 4.8–5.6)
MEAN PLASMA GLUCOSE: 114.02 mg/dL

## 2017-02-26 ENCOUNTER — Ambulatory Visit: Payer: Medicare Other | Admitting: Urology

## 2017-03-29 ENCOUNTER — Ambulatory Visit (INDEPENDENT_AMBULATORY_CARE_PROVIDER_SITE_OTHER): Payer: Medicare Other | Admitting: Orthopedic Surgery

## 2017-03-29 VITALS — BP 139/81 | HR 66 | Ht 72.0 in | Wt 166.0 lb

## 2017-03-29 DIAGNOSIS — M2170 Unequal limb length (acquired), unspecified site: Secondary | ICD-10-CM | POA: Diagnosis not present

## 2017-03-29 DIAGNOSIS — S51012A Laceration without foreign body of left elbow, initial encounter: Secondary | ICD-10-CM

## 2017-03-29 DIAGNOSIS — M25511 Pain in right shoulder: Secondary | ICD-10-CM

## 2017-03-29 DIAGNOSIS — G8929 Other chronic pain: Secondary | ICD-10-CM

## 2017-03-29 MED ORDER — PREDNISONE 10 MG (48) PO TBPK: ORAL_TABLET | Freq: Every day | ORAL | 2 refills | Status: DC

## 2017-03-29 NOTE — Patient Instructions (Addendum)
Apply neosporin to left elbow  Cover with dry sterile dressing   Call [facility] for appointment [phone]

## 2017-03-29 NOTE — Progress Notes (Signed)
Progress Note new problem  Patient ID: Dominic Hodges, male   DOB: 1927-03-11, 84 y.o.   MRN: [id number]  Chief Complaint  Patient presents with  . Follow-up    Recheck on right shoulder  . Elbow Injury    laceration left elbow last night     84 year old male presents for evaluation of chronic right shoulder pain status post 2 injections and Medrol Dosepak.  He also presents for new onset laceration left elbow falling last evening.  He has applied Neosporin and a dressing  He also complains of leg length discrepancy status post right hip fracture with bipolar replacement currently using a cane has a heel lift from [employer] and would like that looked at as well.  As far as the shoulder goes he has had pain with shoulder flexion and pain when he is drinking a drink or lifting something up to his mouth.    Review of Systems  Constitutional: Negative for fever.  Musculoskeletal: Positive for joint pain.  Neurological: Negative for tingling.   No outpatient medications have been marked as taking for the 03/29/17 encounter (Office Visit) with Carole Civil, MD.    Past Medical History:  Diagnosis Date  . Acid reflux    mild-states if doesnt chew food completely he begins to cough- states has weakness in muscles due to ilieus- has been doing exreci this  . Adynamic ileus ([place]) 12/2014   ? anesthesia related per pt  . BPH (benign prostatic hyperplasia)   . Complication of anesthesia    10/11/15- per pt "had [place]- in 11/16-related it to possibly anesthesia"  . H/O seasonal allergies   . Hyperlipidemia   . Hypertension      Allergies  Allergen Reactions  . Robaxin [Methocarbamol] Other (See Comments)    "made me crazy"  . Robaxin [Methocarbamol] Other (See Comments)    Mental confusion    BP 139/81   Pulse 66   Ht 6' (1.829 m)   Wt 166 lb (75.3 kg)   BMI 22.51 kg/m    Physical Exam  Constitutional: He is oriented to person, place,  and time. He appears well-developed and well-nourished.  Vital signs have been reviewed and are stable. Gen. appearance the patient is well-developed and well-nourished with normal grooming and hygiene.   Neurological: He is alert and oriented to person, place, and time.  The patient has an abnormal gait uses a cane and a heel lift is noted in the shoe and looks to be off a little bit  Skin: Skin is warm and dry. No erythema.  Psychiatric: He has a normal mood and affect.  Vitals reviewed.   Ortho Exam Left elbow no deformity is seen there is a laceration which is actually a skin flap which is superficial.  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  . Chronic pain in right shoulder Yes  . Laceration of  left elbow, initial encounter   . Acquired leg length discrepancy     So we dressed the wound  We injected the subacromial space right shoulder  Procedure note the subacromial injection shoulder RIGHT  Verbal consent was obtained to inject the  RIGHT   Shoulder  Timeout was completed to confirm the injection site is a subacromial space of the  RIGHT  shoulder   Medication used Depo-Medrol 40 mg and lidocaine 1% 3 cc  Anesthesia was provided by ethyl chloride  The injection was performed in the RIGHT  posterior subacromial space. After pinning the skin with alcohol and anesthetized the skin with ethyl chloride the  subacromial space was injected using a 20-gauge needle. There were no complications  Sterile dressing was applied.  And we ordered reevaluation of the heel lift for leg length discrepancy  He will have an ongoing Medrol Dosepak order as needed  Meds ordered this encounter  Medications  . predniSONE (STERAPRED UNI-PAK 48 TAB) 10 MG (48) TBPK tablet    Sig: Take by mouth daily. 10 mg 12 day dose pack as directed    Dispense:  48 tablet    Refill:  2     Arther Abbott, MD 03/29/2017 11:50 AM

## 2017-04-08 DIAGNOSIS — D044 Carcinoma in situ of skin of scalp and neck: Secondary | ICD-10-CM | POA: Diagnosis not present

## 2017-04-08 DIAGNOSIS — X32XXXD Exposure to sunlight, subsequent encounter: Secondary | ICD-10-CM | POA: Diagnosis not present

## 2017-04-08 DIAGNOSIS — L570 Actinic keratosis: Secondary | ICD-10-CM | POA: Diagnosis not present

## 2017-05-03 ENCOUNTER — Telehealth: Payer: Self-pay | Admitting: Radiology

## 2017-05-03 NOTE — Telephone Encounter (Signed)
Pt called about number for [employer], I have provided him the number he will call them about his shoe, he has build up and Dr Aline Brochure has given him an order to have it repaired.

## 2017-05-20 DIAGNOSIS — Z85828 Personal history of other malignant neoplasm of skin: Secondary | ICD-10-CM | POA: Diagnosis not present

## 2017-05-20 DIAGNOSIS — C44329 Squamous cell carcinoma of skin of other parts of face: Secondary | ICD-10-CM | POA: Diagnosis not present

## 2017-05-20 DIAGNOSIS — L570 Actinic keratosis: Secondary | ICD-10-CM | POA: Diagnosis not present

## 2017-05-20 DIAGNOSIS — Z08 Encounter for follow-up examination after completed treatment for malignant neoplasm: Secondary | ICD-10-CM | POA: Diagnosis not present

## 2017-05-20 DIAGNOSIS — X32XXXD Exposure to sunlight, subsequent encounter: Secondary | ICD-10-CM | POA: Diagnosis not present

## 2017-06-29 ENCOUNTER — Encounter (HOSPITAL_COMMUNITY): Payer: Self-pay | Admitting: *Deleted

## 2017-06-29 ENCOUNTER — Other Ambulatory Visit: Payer: Self-pay

## 2017-06-29 ENCOUNTER — Emergency Department (HOSPITAL_COMMUNITY)
Admission: EM | Admit: 2017-06-29 | Discharge: 2017-06-30 | Disposition: A | Discharge status: Home / Self Care | Payer: Medicare Other | Attending: Emergency Medicine | Admitting: Emergency Medicine

## 2017-06-29 DIAGNOSIS — W010XXA Fall on same level from slipping, tripping and stumbling without subsequent striking against object, initial encounter: Secondary | ICD-10-CM | POA: Insufficient documentation

## 2017-06-29 DIAGNOSIS — Z87891 Personal history of nicotine dependence: Secondary | ICD-10-CM | POA: Insufficient documentation

## 2017-06-29 DIAGNOSIS — Y929 Unspecified place or not applicable: Secondary | ICD-10-CM | POA: Insufficient documentation

## 2017-06-29 DIAGNOSIS — I10 Essential (primary) hypertension: Secondary | ICD-10-CM | POA: Insufficient documentation

## 2017-06-29 DIAGNOSIS — Z7982 Long term (current) use of aspirin: Secondary | ICD-10-CM | POA: Diagnosis not present

## 2017-06-29 DIAGNOSIS — S51011A Laceration without foreign body of right elbow, initial encounter: Secondary | ICD-10-CM | POA: Insufficient documentation

## 2017-06-29 DIAGNOSIS — Y9389 Activity, other specified: Secondary | ICD-10-CM | POA: Insufficient documentation

## 2017-06-29 DIAGNOSIS — W19XXXA Unspecified fall, initial encounter: Secondary | ICD-10-CM

## 2017-06-29 DIAGNOSIS — Z79899 Other long term (current) drug therapy: Secondary | ICD-10-CM | POA: Diagnosis not present

## 2017-06-29 DIAGNOSIS — Y92009 Unspecified place in unspecified non-institutional (private) residence as the place of occurrence of the external cause: Secondary | ICD-10-CM

## 2017-06-29 DIAGNOSIS — Y999 Unspecified external cause status: Secondary | ICD-10-CM | POA: Diagnosis not present

## 2017-06-29 NOTE — ED Triage Notes (Signed)
Pt reports he got tripped in the dog leash and fell down, hitting his right elbow on the hardwood floor. No LOC. Skin tear to the right elbow. Denies pain in the elbow.

## 2017-06-30 DIAGNOSIS — S51011A Laceration without foreign body of right elbow, initial encounter: Secondary | ICD-10-CM | POA: Diagnosis not present

## 2017-06-30 MED ORDER — BACITRACIN ZINC 500 UNIT/GM EX OINT
TOPICAL_OINTMENT | CUTANEOUS | Status: AC
  Administered 2017-06-30: 1
  Filled 2017-06-30: qty 0.9

## 2017-06-30 NOTE — ED Notes (Signed)
ED Provider at bedside. 

## 2017-06-30 NOTE — Discharge Instructions (Addendum)
Keep the wound covered this weekend. Have Dr Juel Burrow office change the dressing on Monday, June 24. Return to the ED this weekend if you have any new concerns.

## 2017-06-30 NOTE — ED Provider Notes (Signed)
[facility] EMERGENCY DEPARTMENT Provider Note   CSN: [id number] Arrival date & time: 06/29/17  2333  Time seen 23:58 PM    History   Chief Complaint Chief Complaint  Patient presents with  . Fall    HPI Dominic Hodges is a 84 y.o. male.  HPI Dr. Emilee Hero states he was letting his little dog out of the house and he tripped and fell landing on his right elbow.  He has a skin tear to his elbow and he was concerned it might need stitches.  He denies hitting his head or having loss of consciousness.  He has a superficial abrasion of his right knee but he states he is able to walk without difficulty.  He states his last tetanus was 5 years ago or less.  PCP Sinda Du, MD   Past Medical History:  Diagnosis Date  . Acid reflux    mild-states if doesnt chew food completely he begins to cough- states has weakness in muscles due to ilieus- has been doing exreci this  . Adynamic ileus ([place]) 12/2014   ? anesthesia related per pt  . BPH (benign prostatic hyperplasia)   . Complication of anesthesia    10/11/15- per pt "had [place]- in 11/16-related it to possibly anesthesia"  . H/O seasonal allergies   . Hyperlipidemia   . Hypertension     Patient Active Problem List   Diagnosis Date Noted  . Spinal stenosis, lumbar region, with neurogenic claudication 10/19/2015  . Closed right hip fracture ([place]) 12/05/2014  . Closed hip fracture ([place]) 12/05/2014  . Hypertension 12/05/2014  . BPH (benign prostatic hyperplasia) 12/05/2014  . Hyperlipidemia 12/05/2014  . Essential hypertension, benign 06/01/2010  . NUMBNESS/TINGLING 11/20/2006  . BACK PAIN 11/20/2006    Past Surgical History:  Procedure Laterality Date  . APPENDECTOMY     approx 30 years ago/lwb  . CATARACT EXTRACTION W/PHACO Left 09/11/2012   Procedure: CATARACT EXTRACTION PHACO AND INTRAOCULAR LENS PLACEMENT (IOC);  Surgeon: Tonny Branch, MD;  Location: [facility];  Service: Ophthalmology;   Laterality: Left;  CDE: 19.52  . HIP ARTHROPLASTY Right 12/06/2014   Procedure: PARTIAL RIGHT HIP REPLACEMENT;  Surgeon: Carole Civil, MD;  Location: [facility];  Service: Orthopedics;  Laterality: Right;  . LUMBAR LAMINECTOMY/DECOMPRESSION MICRODISCECTOMY N/A 10/19/2015   Procedure: CENTRAL DECOMPRESSION LUMBAR LAMINECTOMY FOR SPINAL STENOSIS, L3,L4FORAMINOTOMY BILATERAL L3,L4 NERVE ROOT;  Surgeon: Latanya Maudlin, MD;  Location: [facility];  Service: Orthopedics;  Laterality: N/A;  . UMBILICAL HERNIA REPAIR     Approx 10 years ago/lwb  . ureteral stone extraction          Home Medications    Prior to Admission medications   Medication Sig Start Date End Date Taking? Authorizing Provider  aspirin EC 81 MG tablet Take 81 mg by mouth daily.    [provider]  cholecalciferol (VITAMIN D) 1000 units tablet Take 5,000 Units by mouth daily.    [provider]  cloNIDine (CATAPRES - DOSED IN MG/24 HR) 0.1 mg/24hr patch Place 0.1 mg onto the skin once a week. TUESDAY    [provider]  HYDROcodone-acetaminophen (NORCO/VICODIN) 5-325 MG tablet Take 1-2 tablets by mouth every 4 (four) hours as needed (mild pain). 10/19/15   Latanya Maudlin, MD  predniSONE (STERAPRED UNI-PAK 48 TAB) 10 MG (48) TBPK tablet Take by mouth daily. 10 mg 12 day dose pack as directed 03/29/17   Carole Civil, MD  rosuvastatin (CRESTOR) 20 MG tablet  Take 20 mg by mouth daily.      [provider]    Family History Family History  Problem Relation Age of Onset  . Coronary artery disease Unknown        no family history of premature CAD    Social History Social History   Tobacco Use  . Smoking status: Former Smoker    Types: Pipe  . Smokeless tobacco: Never Used  Substance Use Topics  . Alcohol use: Yes    Comment: occasional social drink/lwb  . Drug use: No  lives at home   Allergies   Robaxin [methocarbamol] and Robaxin [methocarbamol]   Review of  Systems Review of Systems  All other systems reviewed and are negative.    Physical Exam Updated Vital Signs BP (!) 164/60   Pulse 68   Temp 98.3 F (36.8 C)   Resp 18   Ht 6' (1.829 m)   Wt 77.1 kg (170 lb)   SpO2 96%   BMI 23.06 kg/m   Vital signs normal except for hypertension   Physical Exam  Constitutional: He appears well-developed and well-nourished. No distress.  HENT:  Head: Normocephalic and atraumatic.  Right Ear: External ear normal.  Left Ear: External ear normal.  Nose: Nose normal.  Eyes: Conjunctivae and EOM are normal.  Neck: Normal range of motion.  Cardiovascular: Normal rate.  Pulmonary/Chest: Effort normal. No respiratory distress.  Musculoskeletal: He exhibits no edema or deformity.  Patient has a skin tear around his right elbow.  He has good range of motion and states he only has discomfort with almost complete flexion.  There is no joint effusion palpable.  He has 2 small superficial abrasions on his right knee without joint effusion or tenderness to palpation.  Nursing note and vitals reviewed.      ED Treatments / Results  Labs (all labs ordered are listed, but only abnormal results are displayed) Labs Reviewed - No data to display  EKG None  Radiology No results found.  Procedures Procedures (including critical care time)  Medications Ordered in ED Medications  bacitracin 500 UNIT/GM ointment (has no administration in time range)     Initial Impression / Assessment and Plan / ED Course  I have reviewed the triage vital signs and the nursing notes.  Pertinent labs & imaging results that were available during my care of the patient were reviewed by me and considered in my medical decision making (see chart for details).    Patient had skin folded up underneath itself over his skin tear, when I pull it out the most superficial portion of the skin flap has almost been separated from the inferior portion of the skin tear.  The  inferior portion was able to be spread out over the skin tear site with good approximation.  Nursing staff clean the wound and wrapped it appropriately. He can see his PCP on Monday, June 24 to have the wound redressed.    Final Clinical Impressions(s) / ED Diagnoses   Final diagnoses:  Skin tear of right elbow without complication, initial encounter  Fall in home, initial encounter    ED Discharge Orders    None      Plan discharge  Rolland Porter, MD, Barbette Or, MD 06/30/17 [phone]

## 2017-07-01 DIAGNOSIS — Z85828 Personal history of other malignant neoplasm of skin: Secondary | ICD-10-CM | POA: Diagnosis not present

## 2017-07-01 DIAGNOSIS — S51011A Laceration without foreign body of right elbow, initial encounter: Secondary | ICD-10-CM | POA: Diagnosis not present

## 2017-07-01 DIAGNOSIS — Z08 Encounter for follow-up examination after completed treatment for malignant neoplasm: Secondary | ICD-10-CM | POA: Diagnosis not present

## 2017-08-15 DIAGNOSIS — Z08 Encounter for follow-up examination after completed treatment for malignant neoplasm: Secondary | ICD-10-CM | POA: Diagnosis not present

## 2017-08-15 DIAGNOSIS — X32XXXD Exposure to sunlight, subsequent encounter: Secondary | ICD-10-CM | POA: Diagnosis not present

## 2017-08-15 DIAGNOSIS — L570 Actinic keratosis: Secondary | ICD-10-CM | POA: Diagnosis not present

## 2017-08-15 DIAGNOSIS — Z85828 Personal history of other malignant neoplasm of skin: Secondary | ICD-10-CM | POA: Diagnosis not present

## 2017-08-15 DIAGNOSIS — C44311 Basal cell carcinoma of skin of nose: Secondary | ICD-10-CM | POA: Diagnosis not present

## 2017-10-08 DIAGNOSIS — I10 Essential (primary) hypertension: Secondary | ICD-10-CM | POA: Diagnosis not present

## 2017-10-08 DIAGNOSIS — N401 Enlarged prostate with lower urinary tract symptoms: Secondary | ICD-10-CM | POA: Diagnosis not present

## 2017-10-08 DIAGNOSIS — R1312 Dysphagia, oropharyngeal phase: Secondary | ICD-10-CM | POA: Diagnosis not present

## 2017-10-08 DIAGNOSIS — I129 Hypertensive chronic kidney disease with stage 1 through stage 4 chronic kidney disease, or unspecified chronic kidney disease: Secondary | ICD-10-CM | POA: Diagnosis not present

## 2017-10-08 DIAGNOSIS — Z23 Encounter for immunization: Secondary | ICD-10-CM | POA: Diagnosis not present

## 2017-10-09 ENCOUNTER — Other Ambulatory Visit (HOSPITAL_COMMUNITY)
Admission: RE | Admit: 2017-10-09 | Discharge: 2017-10-09 | Disposition: A | Discharge status: Home / Self Care | Payer: Medicare Other | Source: Ambulatory Visit | Attending: Pulmonary Disease | Admitting: Pulmonary Disease

## 2017-10-09 DIAGNOSIS — I10 Essential (primary) hypertension: Secondary | ICD-10-CM | POA: Insufficient documentation

## 2017-10-09 LAB — LIPID PANEL
Cholesterol: 174 mg/dL (ref 0–200)
HDL: 50 mg/dL (ref >40)
LDL CALC: 118 mg/dL — AB (ref 0–99)
TRIGLYCERIDES: 31 mg/dL (ref <150)
Total CHOL/HDL Ratio: 3.5 RATIO
VLDL: 6 mg/dL (ref 0–40)

## 2017-10-09 LAB — COMPREHENSIVE METABOLIC PANEL
ALK PHOS: 65 U/L (ref 38–126)
ALT: 15 U/L (ref 0–44)
ANION GAP: 9 (ref 5–15)
AST: 20 U/L (ref 15–41)
Albumin: 4.1 g/dL (ref 3.5–5.0)
BUN: 34 mg/dL — ABNORMAL HIGH (ref 8–23)
CO2: 27 mmol/L (ref 22–32)
CREATININE: 1.28 mg/dL — AB (ref 0.61–1.24)
Calcium: 8.9 mg/dL (ref 8.9–10.3)
Chloride: 107 mmol/L (ref 98–111)
GFR, EST AFRICAN AMERICAN: 56 mL/min — AB (ref >60)
GFR, EST NON AFRICAN AMERICAN: 48 mL/min — AB (ref >60)
Glucose, Bld: 114 mg/dL — ABNORMAL HIGH (ref 70–99)
Potassium: 4.1 mmol/L (ref 3.5–5.1)
Sodium: 143 mmol/L (ref 135–145)
Total Bilirubin: 0.9 mg/dL (ref 0.3–1.2)
Total Protein: 7.1 g/dL (ref 6.5–8.1)

## 2017-10-09 LAB — CBC
HCT: 41.2 % (ref 39.0–52.0)
HEMOGLOBIN: 13.6 g/dL (ref 13.0–17.0)
MCH: 31.2 pg (ref 26.0–34.0)
MCHC: 33 g/dL (ref 30.0–36.0)
MCV: 94.5 fL (ref 78.0–100.0)
Platelets: 204 10*3/uL (ref 150–400)
RBC: 4.36 MIL/uL (ref 4.22–5.81)
RDW: 13 % (ref 11.5–15.5)
WBC: 7.4 10*3/uL (ref 4.0–10.5)

## 2017-10-09 LAB — CK: Total CK: 62 U/L (ref 49–397)

## 2017-10-09 LAB — TSH: TSH: 5.515 u[IU]/mL — ABNORMAL HIGH (ref 0.350–4.500)

## 2017-10-10 LAB — TESTOSTERONE: Testosterone: 331 ng/dL (ref 264–916)

## 2017-10-17 ENCOUNTER — Encounter (HOSPITAL_COMMUNITY): Payer: Self-pay | Admitting: Speech Pathology

## 2017-10-17 ENCOUNTER — Other Ambulatory Visit: Payer: Self-pay

## 2017-10-17 ENCOUNTER — Ambulatory Visit (HOSPITAL_COMMUNITY): Payer: Medicare Other | Attending: Pulmonary Disease | Admitting: Speech Pathology

## 2017-10-17 DIAGNOSIS — R1312 Dysphagia, oropharyngeal phase: Secondary | ICD-10-CM | POA: Diagnosis not present

## 2017-10-17 NOTE — Patient Instructions (Signed)
Avoid nuts, seeds, cornbread, and dry/crumbly foods or add moisture to these items if consuming. Chew solids thoroughly. Swallow hard. Swallow 2x for each bite/sip. Cough if voice sounds wet/gurgly. Complete lingual strengthening exercises. Chin tuck against resistance exercises.

## 2017-10-17 NOTE — Therapy (Signed)
[place], [place], [address] Phone: [phone]   Fax:  [phone]  Speech Language Pathology Evaluation/Clinical Swallow Evaluation  Patient Details  Name: Dominic Hodges MRN: [id number] Date of Birth: 1927/09/10 No data recorded  Encounter Date: 10/17/2017  End of Session - 10/17/17 1813    Visit Number  1    Number of Visits  1    Authorization Type  Medicare    SLP Start Time  1519    SLP Stop Time   7517    SLP Time Calculation (min)  47 min    Activity Tolerance  Patient tolerated treatment well       Past Medical History:  Diagnosis Date  . Acid reflux    mild-states if doesnt chew food completely he begins to cough- states has weakness in muscles due to ilieus- has been doing exreci this  . Adynamic ileus ([place]) 12/2014   ? anesthesia related per pt  . BPH (benign prostatic hyperplasia)   . Complication of anesthesia    10/11/15- per pt "had [place]- in 11/16-related it to possibly anesthesia"  . H/O seasonal allergies   . Hyperlipidemia   . Hypertension     Past Surgical History:  Procedure Laterality Date  . APPENDECTOMY     approx 30 years ago/lwb  . CATARACT EXTRACTION W/PHACO Left 09/11/2012   Procedure: CATARACT EXTRACTION PHACO AND INTRAOCULAR LENS PLACEMENT (IOC);  Surgeon: Tonny Branch, MD;  Location: [facility];  Service: Ophthalmology;  Laterality: Left;  CDE: 19.52  . HIP ARTHROPLASTY Right 12/06/2014   Procedure: PARTIAL RIGHT HIP REPLACEMENT;  Surgeon: Carole Civil, MD;  Location: [facility];  Service: Orthopedics;  Laterality: Right;  . LUMBAR LAMINECTOMY/DECOMPRESSION MICRODISCECTOMY N/A 10/19/2015   Procedure: CENTRAL DECOMPRESSION LUMBAR LAMINECTOMY FOR SPINAL STENOSIS, L3,L4FORAMINOTOMY BILATERAL L3,L4 NERVE ROOT;  Surgeon: Latanya Maudlin, MD;  Location: [facility];  Service: Orthopedics;  Laterality: N/A;  . UMBILICAL HERNIA REPAIR     Approx 10 years  ago/lwb  . ureteral stone extraction      There were no vitals filed for this visit.  Subjective Assessment - 10/17/17 1756    Subjective  "I think my swallowing has never been the same since I was intubated for my hip surgery."    Currently in Pain?  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 Sinda Du due to Pt with report of coughing with solid foods. Pt is known to SLP from previous dysphagia intervention in 2016 and 2017.     Type of Study  Bedside Swallow Evaluation    Previous Swallow Assessment  MBSS 12/2014 with recommendation for regular textures and thin liquids with strategies.    Diet Prior to this Study  Regular;Thin liquids    Temperature Spikes Noted  No    Respiratory Status  Room air    History of Recent Intubation  No    Behavior/Cognition  Alert;Cooperative;Pleasant mood    Oral Cavity Assessment  Within Functional Limits    Oral Care Completed by SLP  No    Oral Cavity - Dentition  Adequate natural dentition    Vision  Functional for self-feeding    Self-Feeding Abilities  Able to feed self    Patient Positioning  Upright in chair    Baseline Vocal Quality  Normal    Volitional Cough  Strong    Volitional Swallow  Able to elicit       Oral Motor/Sensory Function - 10/17/17 1811      Oral Motor/Sensory Function   Overall Oral Motor/Sensory Function  Within functional limits      Ice Chips - 10/17/17 1811      Ice Chips   Ice chips  Not tested      Thin Liquid - 10/17/17 1811      Thin Liquid   Thin Liquid   Within functional limits    Presentation  Cup;Self Fed        Puree - 10/17/17 1811      Puree   Puree  Within functional limits    Presentation  Spoon    Other Comments  Pt swallows twice to clear      Solid - 10/17/17 1812      Solid   Solid  Impaired    Presentation  Self Fed    Oral Phase Functional Implications  Oral residue    Other Comments  trace oral residuals, clears with repeat swallow and liquid wash          SLP Education - 10/17/17 1807    Education Details  Assigned HEP related to lingual strengthening    Person(s) Educated  Patient    Methods  Explanation;Handout    Comprehension  Verbalized understanding        Plan - 10/17/17 1813    Clinical Impression Statement Pt presents with suspected mild oropharyngeal phase dysphagia which does not appear to be changed from when seen in 2017, however objective assessment not completed this date. Pt with subjective complaint of phlegm globus sensation and points to sternal notch, coughing with dry, crumbly foods (cornbread), and waking up coughing if he does not use his sleep wedge. Oral motor examination reveals grossly WNL except from mild global lingual weakness. Pt assessed with water, puree, and graham crackers and shows no overt signs or symptoms of aspiration, but does have occasional wet vocal quality which clears after throat clear/cough/dry swallow.   Suspect that Pt continues to present with mild oropharyngeal residuals post initial swallow due to some lingual weakness. Pt was instructed on lingual press and chin tuck against resistance exercises to completed at home (he was familiar with these from previous dysphagia therapy) and given written compensatory strategies (swallow 2x, clear throat, avoid dry/crumbly foods or alter presentation). Pt satisfied with recommendations and would like to try these on his own. He was given my contact information should he have further questions or concerns. It does not  appear that he needs MBSS at this time, but can be completed if swallow function declines.    Treatment/Interventions  SLP instruction and feedback;Oral motor exercises;Pharyngeal strengthening exercises;Patient/family education    Potential to Achieve Goals  Good    Consulted and Agree with Plan of Care  Patient       Patient will benefit from skilled therapeutic intervention in order  to improve the following deficits and impairments:   Dysphagia, oropharyngeal phase    Problem List Patient Active Problem List   Diagnosis Date Noted  . Spinal stenosis, lumbar region, with neurogenic claudication 10/19/2015  . Closed right hip fracture ([place]) 12/05/2014  . Closed hip fracture ([place]) 12/05/2014  . Hypertension 12/05/2014  . BPH (benign prostatic hyperplasia) 12/05/2014  . Hyperlipidemia 12/05/2014  . Essential hypertension, benign 06/01/2010  . NUMBNESS/TINGLING 11/20/2006  . BACK PAIN 11/20/2006   Thank you,  Genene Churn, [place]  Genene Churn 10/17/2017, 6:15 PM  [facility] [place] [address] [place], [place], [address] Phone: [phone]   Fax:  [phone]  Name: Dominic Hodges MRN: [id number] Date of Birth: June 20, 1927

## 2017-11-14 IMAGING — DX DG LUMBAR SPINE 2-3V
2 series · 2 of 2 positions shown · non-contrast
Comparison: CT 09/28/2015

CLINICAL DATA: Preop lumbar spine.

EXAM:
LUMBAR SPINE - 2-3 VIEW

[l-spine ap]
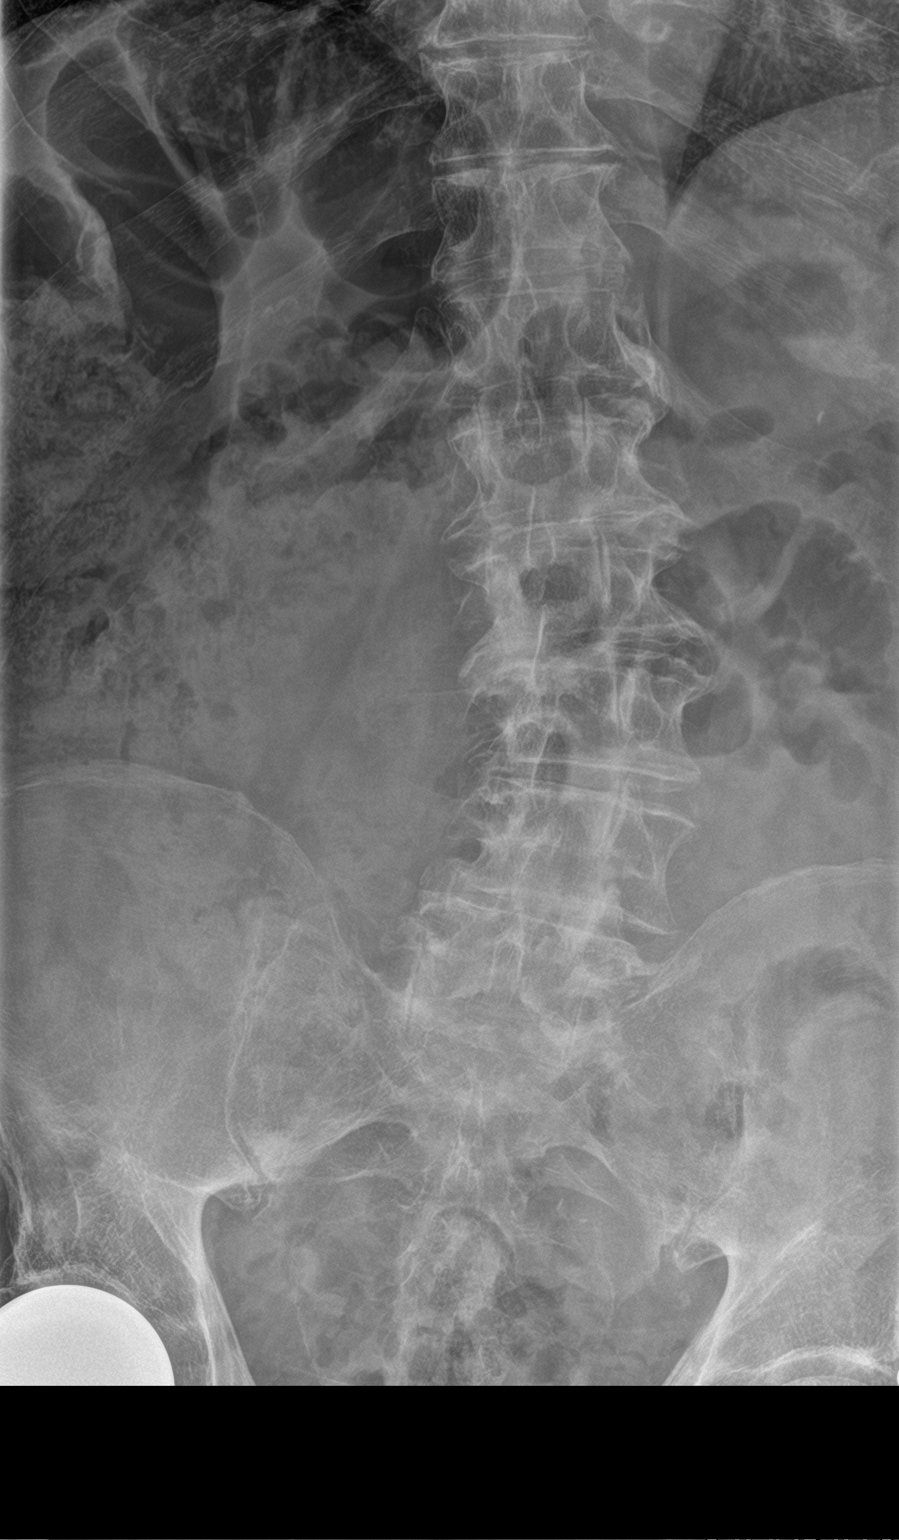

[l-spine lat]
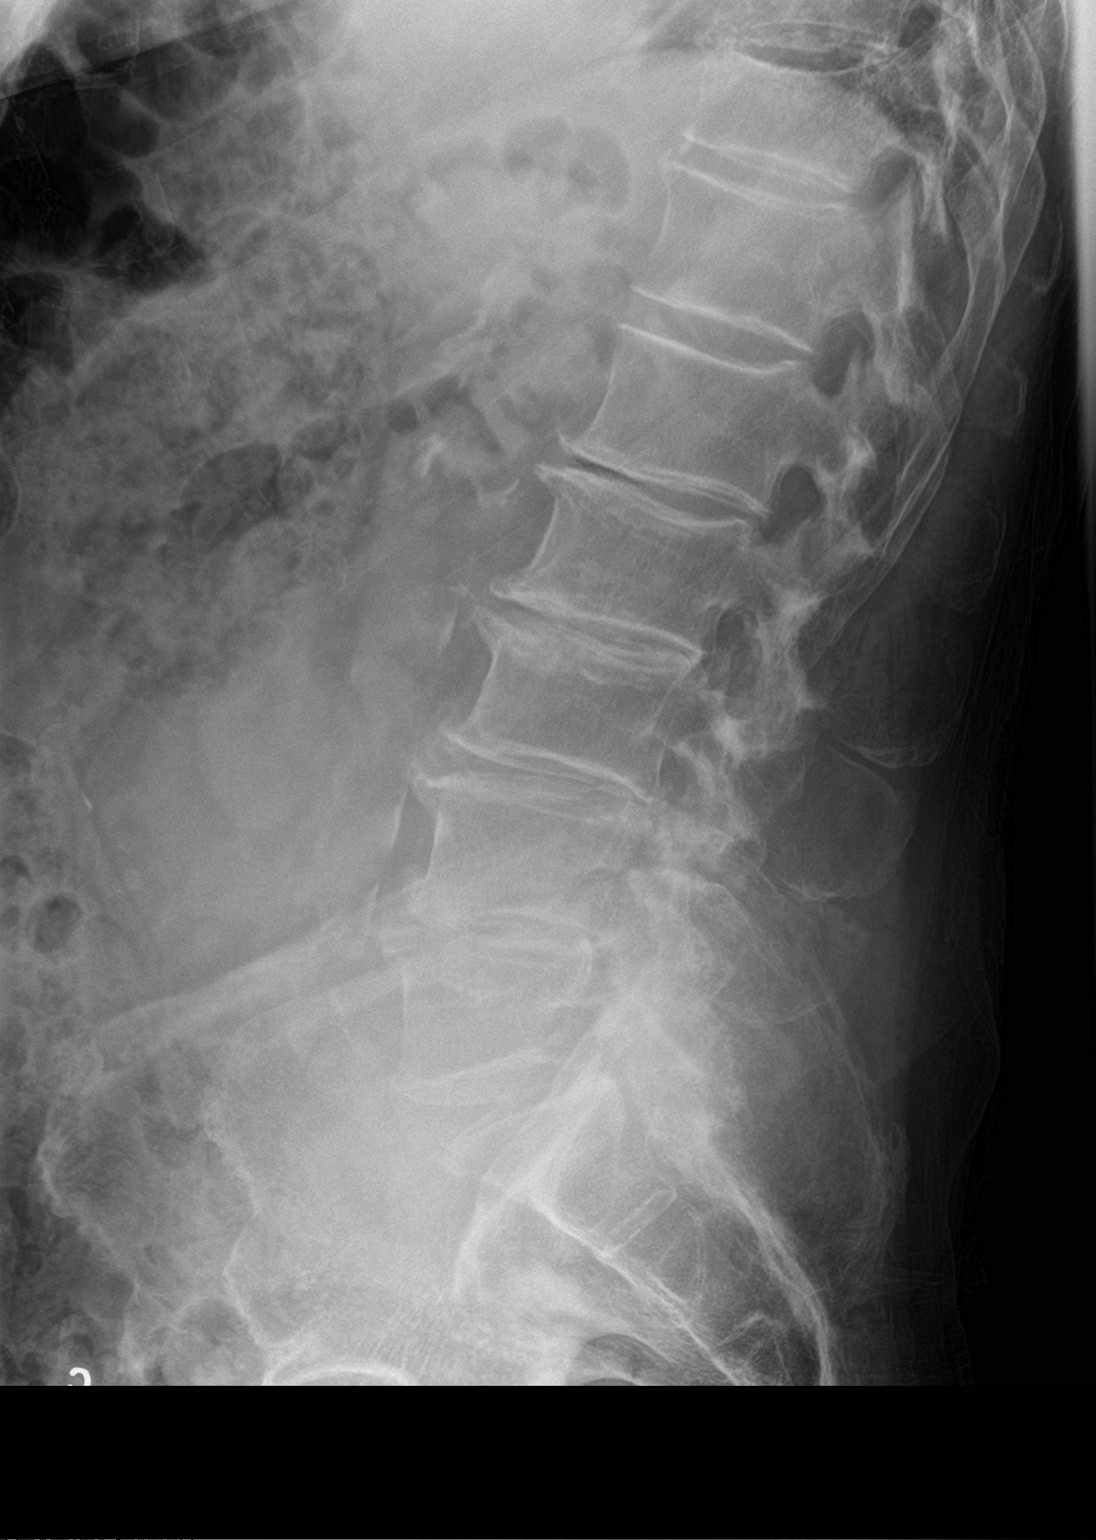

[2 of 2 positions shown; findings below may reference images not displayed]

FINDINGS: Mild levoscoliosis. Diffuse degenerative disc and facet disease
throughout the lumbar spine. No fracture or subluxation. Prior right
hip replacement.
IMPRESSION: Levoscoliosis. Degenerative disc and facet disease. No acute
findings.

## 2017-11-28 DIAGNOSIS — X32XXXD Exposure to sunlight, subsequent encounter: Secondary | ICD-10-CM | POA: Diagnosis not present

## 2017-11-28 DIAGNOSIS — Z85828 Personal history of other malignant neoplasm of skin: Secondary | ICD-10-CM | POA: Diagnosis not present

## 2017-11-28 DIAGNOSIS — Z08 Encounter for follow-up examination after completed treatment for malignant neoplasm: Secondary | ICD-10-CM | POA: Diagnosis not present

## 2017-11-28 DIAGNOSIS — L570 Actinic keratosis: Secondary | ICD-10-CM | POA: Diagnosis not present

## 2018-01-06 DIAGNOSIS — Z961 Presence of intraocular lens: Secondary | ICD-10-CM | POA: Diagnosis not present

## 2018-01-06 DIAGNOSIS — H02132 Senile ectropion of right lower eyelid: Secondary | ICD-10-CM | POA: Diagnosis not present

## 2018-01-06 DIAGNOSIS — H02135 Senile ectropion of left lower eyelid: Secondary | ICD-10-CM | POA: Diagnosis not present

## 2018-01-27 DIAGNOSIS — L570 Actinic keratosis: Secondary | ICD-10-CM | POA: Diagnosis not present

## 2018-01-27 DIAGNOSIS — X32XXXD Exposure to sunlight, subsequent encounter: Secondary | ICD-10-CM | POA: Diagnosis not present

## 2018-03-05 ENCOUNTER — Other Ambulatory Visit (HOSPITAL_COMMUNITY): Payer: Self-pay | Admitting: Pulmonary Disease

## 2018-03-05 ENCOUNTER — Other Ambulatory Visit: Payer: Self-pay | Admitting: Pulmonary Disease

## 2018-03-05 DIAGNOSIS — R310 Gross hematuria: Secondary | ICD-10-CM | POA: Diagnosis not present

## 2018-03-05 DIAGNOSIS — N401 Enlarged prostate with lower urinary tract symptoms: Secondary | ICD-10-CM | POA: Diagnosis not present

## 2018-03-05 DIAGNOSIS — I129 Hypertensive chronic kidney disease with stage 1 through stage 4 chronic kidney disease, or unspecified chronic kidney disease: Secondary | ICD-10-CM | POA: Diagnosis not present

## 2018-03-05 DIAGNOSIS — M545 Low back pain: Secondary | ICD-10-CM | POA: Diagnosis not present

## 2018-03-06 ENCOUNTER — Other Ambulatory Visit (HOSPITAL_COMMUNITY)
Admission: RE | Admit: 2018-03-06 | Discharge: 2018-03-06 | Disposition: A | Discharge status: Home / Self Care | Payer: Medicare Other | Source: Ambulatory Visit | Attending: Pulmonary Disease | Admitting: Pulmonary Disease

## 2018-03-06 ENCOUNTER — Ambulatory Visit (HOSPITAL_COMMUNITY)
Admission: RE | Admit: 2018-03-06 | Discharge: 2018-03-06 | Disposition: A | Discharge status: Home / Self Care | Payer: Medicare Other | Source: Ambulatory Visit | Attending: Pulmonary Disease | Admitting: Pulmonary Disease

## 2018-03-06 ENCOUNTER — Other Ambulatory Visit (HOSPITAL_COMMUNITY): Payer: Self-pay | Admitting: Pulmonary Disease

## 2018-03-06 DIAGNOSIS — M25551 Pain in right hip: Secondary | ICD-10-CM | POA: Diagnosis not present

## 2018-03-06 DIAGNOSIS — R310 Gross hematuria: Secondary | ICD-10-CM | POA: Insufficient documentation

## 2018-03-06 DIAGNOSIS — Z8781 Personal history of (healed) traumatic fracture: Secondary | ICD-10-CM | POA: Insufficient documentation

## 2018-03-06 DIAGNOSIS — S3991XA Unspecified injury of abdomen, initial encounter: Secondary | ICD-10-CM | POA: Diagnosis not present

## 2018-03-06 DIAGNOSIS — S79911A Unspecified injury of right hip, initial encounter: Secondary | ICD-10-CM | POA: Diagnosis not present

## 2018-03-06 LAB — COMPREHENSIVE METABOLIC PANEL
ALK PHOS: 67 U/L (ref 38–126)
ALT: 18 U/L (ref 0–44)
AST: 23 U/L (ref 15–41)
Albumin: 4.4 g/dL (ref 3.5–5.0)
Anion gap: 7 (ref 5–15)
BUN: 32 mg/dL — AB (ref 8–23)
CHLORIDE: 108 mmol/L (ref 98–111)
CO2: 27 mmol/L (ref 22–32)
Calcium: 8.9 mg/dL (ref 8.9–10.3)
Creatinine, Ser: 1.41 mg/dL — ABNORMAL HIGH (ref 0.61–1.24)
GFR calc Af Amer: 51 mL/min — ABNORMAL LOW (ref >60)
GFR calc non Af Amer: 44 mL/min — ABNORMAL LOW (ref >60)
GLUCOSE: 93 mg/dL (ref 70–99)
Potassium: 4.1 mmol/L (ref 3.5–5.1)
SODIUM: 142 mmol/L (ref 135–145)
Total Bilirubin: 1.2 mg/dL (ref 0.3–1.2)
Total Protein: 7.2 g/dL (ref 6.5–8.1)

## 2018-03-06 LAB — CBC
HCT: 43 % (ref 39.0–52.0)
HEMOGLOBIN: 13.8 g/dL (ref 13.0–17.0)
MCH: 30.3 pg (ref 26.0–34.0)
MCHC: 32.1 g/dL (ref 30.0–36.0)
MCV: 94.3 fL (ref 80.0–100.0)
NRBC: 0 % (ref 0.0–0.2)
Platelets: 188 10*3/uL (ref 150–400)
RBC: 4.56 MIL/uL (ref 4.22–5.81)
RDW: 12.8 % (ref 11.5–15.5)
WBC: 8.5 10*3/uL (ref 4.0–10.5)

## 2018-03-14 ENCOUNTER — Telehealth: Payer: Self-pay | Admitting: Orthopedic Surgery

## 2018-03-14 DIAGNOSIS — G8929 Other chronic pain: Secondary | ICD-10-CM

## 2018-03-14 DIAGNOSIS — M25511 Pain in right shoulder: Principal | ICD-10-CM

## 2018-03-14 NOTE — Telephone Encounter (Signed)
Patient called saying his right shoulder is still bothering him. Stated that him and Dr. Aline Brochure talked about maybe getting an MRI done. Patient wants to know if he needs to get the MRI done before he makes an appointment to come in. States if Dr. Aline Brochure needs to speak with him please call him at [phone].  Please call him and advise

## 2018-03-14 NOTE — Telephone Encounter (Signed)
Call him mon morning for appt [phone] add on Monday morning

## 2018-03-17 NOTE — Addendum Note (Signed)
Addended byCandice Camp on: 03/17/2018 04:18 PM   Modules accepted: Orders

## 2018-03-17 NOTE — Telephone Encounter (Signed)
Left message for him to call me back.  

## 2018-03-17 NOTE — Telephone Encounter (Signed)
Thursday March 5th  4pm arrive at 330 I have called him

## 2018-03-17 NOTE — Telephone Encounter (Signed)
Dr Aline Brochure states okay to order the MRI shoulde r

## 2018-03-20 ENCOUNTER — Ambulatory Visit (HOSPITAL_COMMUNITY): Payer: Medicare Other

## 2018-03-21 ENCOUNTER — Ambulatory Visit: Payer: Self-pay | Admitting: Orthopedic Surgery

## 2018-03-31 DIAGNOSIS — I739 Peripheral vascular disease, unspecified: Secondary | ICD-10-CM | POA: Diagnosis not present

## 2018-03-31 DIAGNOSIS — B351 Tinea unguium: Secondary | ICD-10-CM | POA: Diagnosis not present

## 2018-03-31 DIAGNOSIS — M79674 Pain in right toe(s): Secondary | ICD-10-CM | POA: Diagnosis not present

## 2018-03-31 DIAGNOSIS — M79675 Pain in left toe(s): Secondary | ICD-10-CM | POA: Diagnosis not present

## 2018-06-09 DIAGNOSIS — M79674 Pain in right toe(s): Secondary | ICD-10-CM | POA: Diagnosis not present

## 2018-06-09 DIAGNOSIS — M79675 Pain in left toe(s): Secondary | ICD-10-CM | POA: Diagnosis not present

## 2018-06-09 DIAGNOSIS — B351 Tinea unguium: Secondary | ICD-10-CM | POA: Diagnosis not present

## 2018-06-09 DIAGNOSIS — I739 Peripheral vascular disease, unspecified: Secondary | ICD-10-CM | POA: Diagnosis not present

## 2018-07-03 DIAGNOSIS — X32XXXD Exposure to sunlight, subsequent encounter: Secondary | ICD-10-CM | POA: Diagnosis not present

## 2018-07-03 DIAGNOSIS — D0439 Carcinoma in situ of skin of other parts of face: Secondary | ICD-10-CM | POA: Diagnosis not present

## 2018-07-03 DIAGNOSIS — D044 Carcinoma in situ of skin of scalp and neck: Secondary | ICD-10-CM | POA: Diagnosis not present

## 2018-07-03 DIAGNOSIS — L820 Inflamed seborrheic keratosis: Secondary | ICD-10-CM | POA: Diagnosis not present

## 2018-07-03 DIAGNOSIS — L570 Actinic keratosis: Secondary | ICD-10-CM | POA: Diagnosis not present

## 2018-07-24 DIAGNOSIS — Z85828 Personal history of other malignant neoplasm of skin: Secondary | ICD-10-CM | POA: Diagnosis not present

## 2018-07-24 DIAGNOSIS — Z08 Encounter for follow-up examination after completed treatment for malignant neoplasm: Secondary | ICD-10-CM | POA: Diagnosis not present

## 2018-07-24 DIAGNOSIS — L980 Pyogenic granuloma: Secondary | ICD-10-CM | POA: Diagnosis not present

## 2018-08-07 ENCOUNTER — Other Ambulatory Visit: Payer: Self-pay

## 2018-09-19 ENCOUNTER — Other Ambulatory Visit (HOSPITAL_COMMUNITY): Payer: Self-pay | Admitting: Pulmonary Disease

## 2018-09-19 ENCOUNTER — Ambulatory Visit (HOSPITAL_COMMUNITY)
Admission: RE | Admit: 2018-09-19 | Discharge: 2018-09-19 | Disposition: A | Discharge status: Home / Self Care | Payer: Medicare Other | Source: Ambulatory Visit | Attending: Pulmonary Disease | Admitting: Pulmonary Disease

## 2018-09-19 ENCOUNTER — Other Ambulatory Visit: Payer: Self-pay

## 2018-09-19 DIAGNOSIS — I6523 Occlusion and stenosis of bilateral carotid arteries: Secondary | ICD-10-CM | POA: Diagnosis not present

## 2018-09-19 DIAGNOSIS — H53123 Transient visual loss, bilateral: Secondary | ICD-10-CM | POA: Insufficient documentation

## 2018-09-23 DIAGNOSIS — R2681 Unsteadiness on feet: Secondary | ICD-10-CM | POA: Diagnosis not present

## 2018-09-23 DIAGNOSIS — G453 Amaurosis fugax: Secondary | ICD-10-CM | POA: Diagnosis not present

## 2018-09-23 DIAGNOSIS — I10 Essential (primary) hypertension: Secondary | ICD-10-CM | POA: Diagnosis not present

## 2018-09-25 ENCOUNTER — Other Ambulatory Visit: Payer: Self-pay | Admitting: Neurology

## 2018-09-25 ENCOUNTER — Other Ambulatory Visit (HOSPITAL_COMMUNITY): Payer: Self-pay | Admitting: Neurology

## 2018-09-25 DIAGNOSIS — I671 Cerebral aneurysm, nonruptured: Secondary | ICD-10-CM

## 2018-10-02 DIAGNOSIS — D044 Carcinoma in situ of skin of scalp and neck: Secondary | ICD-10-CM | POA: Diagnosis not present

## 2018-10-02 DIAGNOSIS — L570 Actinic keratosis: Secondary | ICD-10-CM | POA: Diagnosis not present

## 2018-10-02 DIAGNOSIS — C44622 Squamous cell carcinoma of skin of right upper limb, including shoulder: Secondary | ICD-10-CM | POA: Diagnosis not present

## 2018-10-02 DIAGNOSIS — X32XXXD Exposure to sunlight, subsequent encounter: Secondary | ICD-10-CM | POA: Diagnosis not present

## 2018-10-08 ENCOUNTER — Ambulatory Visit (HOSPITAL_COMMUNITY)
Admission: RE | Admit: 2018-10-08 | Discharge: 2018-10-08 | Disposition: A | Discharge status: Home / Self Care | Payer: Medicare Other | Source: Ambulatory Visit | Attending: Neurology | Admitting: Neurology

## 2018-10-08 ENCOUNTER — Other Ambulatory Visit: Payer: Self-pay

## 2018-10-08 DIAGNOSIS — I6501 Occlusion and stenosis of right vertebral artery: Secondary | ICD-10-CM | POA: Diagnosis not present

## 2018-10-08 DIAGNOSIS — I671 Cerebral aneurysm, nonruptured: Secondary | ICD-10-CM | POA: Diagnosis not present

## 2018-10-08 DIAGNOSIS — I6523 Occlusion and stenosis of bilateral carotid arteries: Secondary | ICD-10-CM | POA: Diagnosis not present

## 2018-10-08 LAB — POCT I-STAT CREATININE: Creatinine, Ser: 1.4 mg/dL — ABNORMAL HIGH (ref 0.61–1.24)

## 2018-10-08 MED ORDER — IOHEXOL 350 MG/ML SOLN: 75.0000 mL | Freq: Once | INTRAVENOUS | Status: DC | PRN

## 2018-10-08 MED ORDER — IOHEXOL 350 MG/ML SOLN
100.0000 mL | Freq: Once | INTRAVENOUS | Status: AC | PRN
  Administered 2018-10-08: 14:00:00 60 mL via INTRAVENOUS

## 2018-10-16 ENCOUNTER — Other Ambulatory Visit: Payer: Self-pay | Admitting: Orthopedic Surgery

## 2018-10-16 DIAGNOSIS — G8929 Other chronic pain: Secondary | ICD-10-CM

## 2018-10-27 DIAGNOSIS — M79674 Pain in right toe(s): Secondary | ICD-10-CM | POA: Diagnosis not present

## 2018-10-27 DIAGNOSIS — M79675 Pain in left toe(s): Secondary | ICD-10-CM | POA: Diagnosis not present

## 2018-10-27 DIAGNOSIS — B351 Tinea unguium: Secondary | ICD-10-CM | POA: Diagnosis not present

## 2018-10-27 DIAGNOSIS — I739 Peripheral vascular disease, unspecified: Secondary | ICD-10-CM | POA: Diagnosis not present

## 2018-11-17 DIAGNOSIS — I10 Essential (primary) hypertension: Secondary | ICD-10-CM | POA: Diagnosis not present

## 2018-11-17 DIAGNOSIS — R1312 Dysphagia, oropharyngeal phase: Secondary | ICD-10-CM | POA: Diagnosis not present

## 2018-11-17 DIAGNOSIS — M7020 Olecranon bursitis, unspecified elbow: Secondary | ICD-10-CM | POA: Diagnosis not present

## 2018-11-17 DIAGNOSIS — Z23 Encounter for immunization: Secondary | ICD-10-CM | POA: Diagnosis not present

## 2018-11-17 DIAGNOSIS — M545 Low back pain: Secondary | ICD-10-CM | POA: Diagnosis not present

## 2019-01-20 DIAGNOSIS — Z23 Encounter for immunization: Secondary | ICD-10-CM | POA: Diagnosis not present

## 2019-01-28 DIAGNOSIS — M25551 Pain in right hip: Secondary | ICD-10-CM | POA: Diagnosis not present

## 2019-01-28 DIAGNOSIS — R21 Rash and other nonspecific skin eruption: Secondary | ICD-10-CM | POA: Diagnosis not present

## 2019-01-28 DIAGNOSIS — R1310 Dysphagia, unspecified: Secondary | ICD-10-CM | POA: Diagnosis not present

## 2019-01-28 DIAGNOSIS — Z0189 Encounter for other specified special examinations: Secondary | ICD-10-CM | POA: Diagnosis not present

## 2019-01-28 DIAGNOSIS — I10 Essential (primary) hypertension: Secondary | ICD-10-CM | POA: Diagnosis not present

## 2019-01-28 DIAGNOSIS — M25511 Pain in right shoulder: Secondary | ICD-10-CM | POA: Diagnosis not present

## 2019-02-11 DIAGNOSIS — Z0189 Encounter for other specified special examinations: Secondary | ICD-10-CM | POA: Diagnosis not present

## 2019-02-11 DIAGNOSIS — Z1329 Encounter for screening for other suspected endocrine disorder: Secondary | ICD-10-CM | POA: Diagnosis not present

## 2019-02-11 DIAGNOSIS — Z1321 Encounter for screening for nutritional disorder: Secondary | ICD-10-CM | POA: Diagnosis not present

## 2019-02-11 DIAGNOSIS — N401 Enlarged prostate with lower urinary tract symptoms: Secondary | ICD-10-CM | POA: Diagnosis not present

## 2019-02-11 DIAGNOSIS — I10 Essential (primary) hypertension: Secondary | ICD-10-CM | POA: Diagnosis not present

## 2019-02-12 DIAGNOSIS — C4442 Squamous cell carcinoma of skin of scalp and neck: Secondary | ICD-10-CM | POA: Diagnosis not present

## 2019-02-12 DIAGNOSIS — L570 Actinic keratosis: Secondary | ICD-10-CM | POA: Diagnosis not present

## 2019-02-12 DIAGNOSIS — X32XXXD Exposure to sunlight, subsequent encounter: Secondary | ICD-10-CM | POA: Diagnosis not present

## 2019-02-18 DIAGNOSIS — I10 Essential (primary) hypertension: Secondary | ICD-10-CM | POA: Diagnosis not present

## 2019-02-18 DIAGNOSIS — R21 Rash and other nonspecific skin eruption: Secondary | ICD-10-CM | POA: Diagnosis not present

## 2019-02-18 DIAGNOSIS — R1310 Dysphagia, unspecified: Secondary | ICD-10-CM | POA: Diagnosis not present

## 2019-02-18 DIAGNOSIS — M25511 Pain in right shoulder: Secondary | ICD-10-CM | POA: Diagnosis not present

## 2019-02-18 DIAGNOSIS — N1831 Chronic kidney disease, stage 3a: Secondary | ICD-10-CM | POA: Diagnosis not present

## 2019-02-18 DIAGNOSIS — M25551 Pain in right hip: Secondary | ICD-10-CM | POA: Diagnosis not present

## 2019-03-02 DIAGNOSIS — Z23 Encounter for immunization: Secondary | ICD-10-CM | POA: Diagnosis not present

## 2019-05-29 DIAGNOSIS — I10 Essential (primary) hypertension: Secondary | ICD-10-CM | POA: Diagnosis not present

## 2019-05-29 DIAGNOSIS — Z0189 Encounter for other specified special examinations: Secondary | ICD-10-CM | POA: Diagnosis not present

## 2019-05-29 DIAGNOSIS — Z0000 Encounter for general adult medical examination without abnormal findings: Secondary | ICD-10-CM | POA: Diagnosis not present

## 2019-06-03 DIAGNOSIS — N3941 Urge incontinence: Secondary | ICD-10-CM | POA: Diagnosis not present

## 2019-06-03 DIAGNOSIS — I10 Essential (primary) hypertension: Secondary | ICD-10-CM | POA: Diagnosis not present

## 2019-06-03 DIAGNOSIS — M25551 Pain in right hip: Secondary | ICD-10-CM | POA: Diagnosis not present

## 2019-06-03 DIAGNOSIS — R1310 Dysphagia, unspecified: Secondary | ICD-10-CM | POA: Diagnosis not present

## 2019-06-03 DIAGNOSIS — M25511 Pain in right shoulder: Secondary | ICD-10-CM | POA: Diagnosis not present

## 2019-06-03 DIAGNOSIS — N1831 Chronic kidney disease, stage 3a: Secondary | ICD-10-CM | POA: Diagnosis not present

## 2019-06-03 DIAGNOSIS — R21 Rash and other nonspecific skin eruption: Secondary | ICD-10-CM | POA: Diagnosis not present

## 2019-06-03 DIAGNOSIS — N401 Enlarged prostate with lower urinary tract symptoms: Secondary | ICD-10-CM | POA: Diagnosis not present

## 2019-06-25 DIAGNOSIS — X32XXXD Exposure to sunlight, subsequent encounter: Secondary | ICD-10-CM | POA: Diagnosis not present

## 2019-06-25 DIAGNOSIS — L570 Actinic keratosis: Secondary | ICD-10-CM | POA: Diagnosis not present

## 2019-08-17 DIAGNOSIS — I739 Peripheral vascular disease, unspecified: Secondary | ICD-10-CM | POA: Diagnosis not present

## 2019-08-17 DIAGNOSIS — B351 Tinea unguium: Secondary | ICD-10-CM | POA: Diagnosis not present

## 2019-08-17 DIAGNOSIS — M79675 Pain in left toe(s): Secondary | ICD-10-CM | POA: Diagnosis not present

## 2019-08-17 DIAGNOSIS — M79674 Pain in right toe(s): Secondary | ICD-10-CM | POA: Diagnosis not present

## 2019-10-12 DIAGNOSIS — Z0189 Encounter for other specified special examinations: Secondary | ICD-10-CM | POA: Diagnosis not present

## 2019-10-12 DIAGNOSIS — Z23 Encounter for immunization: Secondary | ICD-10-CM | POA: Diagnosis not present

## 2019-10-28 DIAGNOSIS — H16223 Keratoconjunctivitis sicca, not specified as Sjogren's, bilateral: Secondary | ICD-10-CM | POA: Diagnosis not present

## 2019-11-02 DIAGNOSIS — R1310 Dysphagia, unspecified: Secondary | ICD-10-CM | POA: Diagnosis not present

## 2019-11-02 DIAGNOSIS — N1831 Chronic kidney disease, stage 3a: Secondary | ICD-10-CM | POA: Diagnosis not present

## 2019-11-02 DIAGNOSIS — I10 Essential (primary) hypertension: Secondary | ICD-10-CM | POA: Diagnosis not present

## 2019-11-02 DIAGNOSIS — M25511 Pain in right shoulder: Secondary | ICD-10-CM | POA: Diagnosis not present

## 2019-11-02 DIAGNOSIS — N401 Enlarged prostate with lower urinary tract symptoms: Secondary | ICD-10-CM | POA: Diagnosis not present

## 2019-11-02 DIAGNOSIS — R21 Rash and other nonspecific skin eruption: Secondary | ICD-10-CM | POA: Diagnosis not present

## 2019-11-02 DIAGNOSIS — M25551 Pain in right hip: Secondary | ICD-10-CM | POA: Diagnosis not present

## 2019-11-12 DIAGNOSIS — R2689 Other abnormalities of gait and mobility: Secondary | ICD-10-CM | POA: Diagnosis not present

## 2019-11-12 DIAGNOSIS — R21 Rash and other nonspecific skin eruption: Secondary | ICD-10-CM | POA: Diagnosis not present

## 2019-11-12 DIAGNOSIS — I10 Essential (primary) hypertension: Secondary | ICD-10-CM | POA: Diagnosis not present

## 2019-11-12 DIAGNOSIS — N1831 Chronic kidney disease, stage 3a: Secondary | ICD-10-CM | POA: Diagnosis not present

## 2019-11-12 DIAGNOSIS — Z23 Encounter for immunization: Secondary | ICD-10-CM | POA: Diagnosis not present

## 2019-11-12 DIAGNOSIS — M25551 Pain in right hip: Secondary | ICD-10-CM | POA: Diagnosis not present

## 2019-11-19 ENCOUNTER — Other Ambulatory Visit: Payer: Self-pay

## 2019-11-19 ENCOUNTER — Encounter: Payer: Self-pay | Admitting: Orthopedic Surgery

## 2019-11-19 ENCOUNTER — Ambulatory Visit (INDEPENDENT_AMBULATORY_CARE_PROVIDER_SITE_OTHER): Payer: Medicare Other | Admitting: Orthopedic Surgery

## 2019-11-19 VITALS — BP 142/64 | HR 74 | Ht 72.0 in | Wt 160.0 lb

## 2019-11-19 DIAGNOSIS — M25511 Pain in right shoulder: Secondary | ICD-10-CM | POA: Diagnosis not present

## 2019-11-19 DIAGNOSIS — Z96641 Presence of right artificial hip joint: Secondary | ICD-10-CM

## 2019-11-19 DIAGNOSIS — G8929 Other chronic pain: Secondary | ICD-10-CM

## 2019-11-19 DIAGNOSIS — R269 Unspecified abnormalities of gait and mobility: Secondary | ICD-10-CM | POA: Diagnosis not present

## 2019-11-19 NOTE — Progress Notes (Signed)
Chief Complaint  Patient presents with  . Shoulder Pain    Rt shoulder pain. Also needs paperwork done for balance concerns  . Gait Problem   Encounter Diagnoses  Name Primary?  . Gait disturbance Yes  . Chronic right shoulder pain   . 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  He has a balance problem with difficulty with gait requiring a cane  3.  He is trying to get his license back  4.  He has chronic pain in his right shoulder, we were planning to get an MRI of the shoulder when the pandemic hit and we never got it.  Past Medical History:  Diagnosis Date  . Acid reflux    mild-states if doesnt chew food completely he begins to cough- states has weakness in muscles due to ilieus- has been doing exreci this  . Adynamic ileus ([place]) 12/2014   ? anesthesia related per pt  . BPH (benign prostatic hyperplasia)   . Complication of anesthesia    10/11/15- per pt "had [place]- in 11/16-related it to possibly anesthesia"  . H/O seasonal allergies   . Hyperlipidemia   . Hypertension     BP (!) 142/64   Pulse 74   Ht 6' (1.829 m)   Wt 160 lb (72.6 kg)   BMI 21.70 kg/m   Right shoulder pain with all range of motion he has weakness in abduction and flexion  His leg length discrepancy is primarily in his spine  His x-ray shows a pelvic obliquity  His ASIS to medial malleolus shows 1/2 cm or less difference in the actual leg lengths.  The paperwork for his driving as far as the musculoskeletal portion goes recognizes that although he has balance issues he has no problems controlling his right lower extremity for driving   Procedure note the subacromial injection shoulder RIGHT    Verbal consent was obtained to  inject the  RIGHT   Shoulder  Timeout was completed to confirm the injection site is a subacromial space of the  RIGHT  shoulder   Medication used Depo-Medrol 40 mg and lidocaine 1% 3 cc  Anesthesia was provided by ethyl chloride  The injection was performed in the RIGHT  posterior subacromial space. After pinning the skin with alcohol and anesthetized the skin with ethyl chloride the subacromial space was injected using a 20-gauge needle. There were no complications  Sterile dressing was applied.  He will come back if the injection does not help his shoulder we will reevaluate and then probably have to do an MRI to document and diagnose the problem  Encounter Diagnoses  Name Primary?  . Gait disturbance Yes  . Chronic right shoulder pain   . Status post right hip replacement 2016

## 2019-11-26 DIAGNOSIS — L570 Actinic keratosis: Secondary | ICD-10-CM | POA: Diagnosis not present

## 2019-11-26 DIAGNOSIS — X32XXXD Exposure to sunlight, subsequent encounter: Secondary | ICD-10-CM | POA: Diagnosis not present

## 2019-12-18 DIAGNOSIS — Z23 Encounter for immunization: Secondary | ICD-10-CM | POA: Diagnosis not present

## 2020-01-18 DIAGNOSIS — I739 Peripheral vascular disease, unspecified: Secondary | ICD-10-CM | POA: Diagnosis not present

## 2020-01-18 DIAGNOSIS — M79674 Pain in right toe(s): Secondary | ICD-10-CM | POA: Diagnosis not present

## 2020-01-18 DIAGNOSIS — B351 Tinea unguium: Secondary | ICD-10-CM | POA: Diagnosis not present

## 2020-01-18 DIAGNOSIS — L851 Acquired keratosis [keratoderma] palmaris et plantaris: Secondary | ICD-10-CM | POA: Diagnosis not present

## 2020-01-18 DIAGNOSIS — M79675 Pain in left toe(s): Secondary | ICD-10-CM | POA: Diagnosis not present

## 2020-03-07 ENCOUNTER — Inpatient Hospital Stay (HOSPITAL_COMMUNITY)
Admission: EM | Admit: 2020-03-07 | Discharge: 2020-03-11 | DRG: 522 | Disposition: A | Discharge status: Skilled Nursing Facility | Payer: Medicare Other | Attending: Internal Medicine | Admitting: Internal Medicine

## 2020-03-07 ENCOUNTER — Encounter (HOSPITAL_COMMUNITY): Payer: Self-pay | Admitting: Emergency Medicine

## 2020-03-07 ENCOUNTER — Other Ambulatory Visit: Payer: Self-pay

## 2020-03-07 ENCOUNTER — Emergency Department (HOSPITAL_COMMUNITY): Payer: Medicare Other

## 2020-03-07 DIAGNOSIS — W1809XA Striking against other object with subsequent fall, initial encounter: Secondary | ICD-10-CM | POA: Diagnosis present

## 2020-03-07 DIAGNOSIS — Z9181 History of falling: Secondary | ICD-10-CM | POA: Diagnosis not present

## 2020-03-07 DIAGNOSIS — E782 Mixed hyperlipidemia: Secondary | ICD-10-CM

## 2020-03-07 DIAGNOSIS — E785 Hyperlipidemia, unspecified: Secondary | ICD-10-CM | POA: Diagnosis present

## 2020-03-07 DIAGNOSIS — R296 Repeated falls: Secondary | ICD-10-CM | POA: Diagnosis present

## 2020-03-07 DIAGNOSIS — R000 Tachycardia, unspecified: Secondary | ICD-10-CM | POA: Diagnosis not present

## 2020-03-07 DIAGNOSIS — N400 Enlarged prostate without lower urinary tract symptoms: Secondary | ICD-10-CM | POA: Diagnosis present

## 2020-03-07 DIAGNOSIS — Z888 Allergy status to other drugs, medicaments and biological substances status: Secondary | ICD-10-CM

## 2020-03-07 DIAGNOSIS — Z96642 Presence of left artificial hip joint: Secondary | ICD-10-CM | POA: Diagnosis not present

## 2020-03-07 DIAGNOSIS — M6281 Muscle weakness (generalized): Secondary | ICD-10-CM | POA: Diagnosis not present

## 2020-03-07 DIAGNOSIS — Z79899 Other long term (current) drug therapy: Secondary | ICD-10-CM | POA: Diagnosis not present

## 2020-03-07 DIAGNOSIS — N1831 Chronic kidney disease, stage 3a: Secondary | ICD-10-CM | POA: Diagnosis not present

## 2020-03-07 DIAGNOSIS — Z9842 Cataract extraction status, left eye: Secondary | ICD-10-CM | POA: Diagnosis not present

## 2020-03-07 DIAGNOSIS — W19XXXA Unspecified fall, initial encounter: Secondary | ICD-10-CM | POA: Diagnosis not present

## 2020-03-07 DIAGNOSIS — Z8249 Family history of ischemic heart disease and other diseases of the circulatory system: Secondary | ICD-10-CM

## 2020-03-07 DIAGNOSIS — Z961 Presence of intraocular lens: Secondary | ICD-10-CM | POA: Diagnosis present

## 2020-03-07 DIAGNOSIS — I10 Essential (primary) hypertension: Secondary | ICD-10-CM | POA: Diagnosis not present

## 2020-03-07 DIAGNOSIS — I129 Hypertensive chronic kidney disease with stage 1 through stage 4 chronic kidney disease, or unspecified chronic kidney disease: Secondary | ICD-10-CM | POA: Diagnosis present

## 2020-03-07 DIAGNOSIS — N183 Chronic kidney disease, stage 3 unspecified: Secondary | ICD-10-CM | POA: Diagnosis not present

## 2020-03-07 DIAGNOSIS — K219 Gastro-esophageal reflux disease without esophagitis: Secondary | ICD-10-CM | POA: Diagnosis present

## 2020-03-07 DIAGNOSIS — S72112A Displaced fracture of greater trochanter of left femur, initial encounter for closed fracture: Secondary | ICD-10-CM | POA: Diagnosis present

## 2020-03-07 DIAGNOSIS — N281 Cyst of kidney, acquired: Secondary | ICD-10-CM | POA: Diagnosis not present

## 2020-03-07 DIAGNOSIS — Z96641 Presence of right artificial hip joint: Secondary | ICD-10-CM | POA: Diagnosis present

## 2020-03-07 DIAGNOSIS — S72002A Fracture of unspecified part of neck of left femur, initial encounter for closed fracture: Secondary | ICD-10-CM

## 2020-03-07 DIAGNOSIS — D62 Acute posthemorrhagic anemia: Secondary | ICD-10-CM | POA: Diagnosis not present

## 2020-03-07 DIAGNOSIS — Z87891 Personal history of nicotine dependence: Secondary | ICD-10-CM

## 2020-03-07 DIAGNOSIS — Z7982 Long term (current) use of aspirin: Secondary | ICD-10-CM | POA: Diagnosis not present

## 2020-03-07 DIAGNOSIS — N390 Urinary tract infection, site not specified: Secondary | ICD-10-CM | POA: Diagnosis not present

## 2020-03-07 DIAGNOSIS — R262 Difficulty in walking, not elsewhere classified: Secondary | ICD-10-CM | POA: Diagnosis not present

## 2020-03-07 DIAGNOSIS — Z471 Aftercare following joint replacement surgery: Secondary | ICD-10-CM | POA: Diagnosis not present

## 2020-03-07 DIAGNOSIS — R52 Pain, unspecified: Secondary | ICD-10-CM | POA: Diagnosis not present

## 2020-03-07 DIAGNOSIS — S72112D Displaced fracture of greater trochanter of left femur, subsequent encounter for closed fracture with routine healing: Secondary | ICD-10-CM | POA: Diagnosis not present

## 2020-03-07 DIAGNOSIS — R41841 Cognitive communication deficit: Secondary | ICD-10-CM | POA: Diagnosis not present

## 2020-03-07 DIAGNOSIS — Z0000 Encounter for general adult medical examination without abnormal findings: Secondary | ICD-10-CM

## 2020-03-07 DIAGNOSIS — M25552 Pain in left hip: Secondary | ICD-10-CM | POA: Diagnosis not present

## 2020-03-07 DIAGNOSIS — S7292XA Unspecified fracture of left femur, initial encounter for closed fracture: Secondary | ICD-10-CM

## 2020-03-07 DIAGNOSIS — Z20822 Contact with and (suspected) exposure to covid-19: Secondary | ICD-10-CM | POA: Diagnosis not present

## 2020-03-07 DIAGNOSIS — N179 Acute kidney failure, unspecified: Secondary | ICD-10-CM | POA: Diagnosis not present

## 2020-03-07 DIAGNOSIS — B965 Pseudomonas (aeruginosa) (mallei) (pseudomallei) as the cause of diseases classified elsewhere: Secondary | ICD-10-CM | POA: Diagnosis not present

## 2020-03-07 DIAGNOSIS — R1312 Dysphagia, oropharyngeal phase: Secondary | ICD-10-CM | POA: Diagnosis not present

## 2020-03-07 DIAGNOSIS — S72032A Displaced midcervical fracture of left femur, initial encounter for closed fracture: Secondary | ICD-10-CM | POA: Diagnosis not present

## 2020-03-07 LAB — CBC WITH DIFFERENTIAL/PLATELET
Abs Immature Granulocytes: 0.05 10*3/uL (ref 0.00–0.07)
Basophils Absolute: 0 10*3/uL (ref 0.0–0.1)
Basophils Relative: 1 %
Eosinophils Absolute: 0.1 10*3/uL (ref 0.0–0.5)
Eosinophils Relative: 1 %
HCT: 39.9 % (ref 39.0–52.0)
Hemoglobin: 13 g/dL (ref 13.0–17.0)
Immature Granulocytes: 1 %
Lymphocytes Relative: 16 %
Lymphs Abs: 1.3 10*3/uL (ref 0.7–4.0)
MCH: 31.4 pg (ref 26.0–34.0)
MCHC: 32.6 g/dL (ref 30.0–36.0)
MCV: 96.4 fL (ref 80.0–100.0)
Monocytes Absolute: 0.5 10*3/uL (ref 0.1–1.0)
Monocytes Relative: 6 %
Neutro Abs: 6.5 10*3/uL (ref 1.7–7.7)
Neutrophils Relative %: 75 %
Platelets: 183 10*3/uL (ref 150–400)
RBC: 4.14 MIL/uL — ABNORMAL LOW (ref 4.22–5.81)
RDW: 12.6 % (ref 11.5–15.5)
WBC: 8.5 10*3/uL (ref 4.0–10.5)
nRBC: 0 % (ref 0.0–0.2)

## 2020-03-07 LAB — TYPE AND SCREEN
ABO/RH(D): A NEG
Antibody Screen: NEGATIVE

## 2020-03-07 LAB — BASIC METABOLIC PANEL
Anion gap: 11 (ref 5–15)
BUN: 30 mg/dL — ABNORMAL HIGH (ref 8–23)
CO2: 26 mmol/L (ref 22–32)
Calcium: 8.9 mg/dL (ref 8.9–10.3)
Chloride: 105 mmol/L (ref 98–111)
Creatinine, Ser: 1.33 mg/dL — ABNORMAL HIGH (ref 0.61–1.24)
GFR, Estimated: 51 mL/min — ABNORMAL LOW (ref >60)
Glucose, Bld: 108 mg/dL — ABNORMAL HIGH (ref 70–99)
Potassium: 4 mmol/L (ref 3.5–5.1)
Sodium: 142 mmol/L (ref 135–145)

## 2020-03-07 LAB — PROTIME-INR
INR: 1.1 (ref 0.8–1.2)
Prothrombin Time: 13.7 seconds (ref 11.4–15.2)

## 2020-03-07 LAB — POC SARS CORONAVIRUS 2 AG -  ED: SARS Coronavirus 2 Ag: NEGATIVE

## 2020-03-07 MED ORDER — FENTANYL CITRATE (PF) 100 MCG/2ML IJ SOLN
12.5000 ug | INTRAMUSCULAR | Status: DC | PRN
  Administered 2020-03-07 – 2020-03-08 (×3): 12.5 ug via INTRAVENOUS
  Filled 2020-03-07 (×3): qty 2

## 2020-03-07 MED ORDER — CLONIDINE HCL 0.1 MG/24HR TD PTWK
0.1000 mg | MEDICATED_PATCH | TRANSDERMAL | Status: DC
  Filled 2020-03-07 (×2): qty 1

## 2020-03-07 NOTE — ED Triage Notes (Signed)
Pt tangled up with walker and fell. Pt c/o left hip pain.

## 2020-03-07 NOTE — ED Notes (Signed)
Update provided to daughter [name] CB# [phone] with pt consent

## 2020-03-07 NOTE — ED Notes (Signed)
Attempted to call report x 1  

## 2020-03-07 NOTE — ED Notes (Signed)
Patient transported to X-ray 

## 2020-03-07 NOTE — H&P (Signed)
History and Physical  Dominic Hodges HQR:[id number] DOB: 06/01/1927 DOA: 03/07/2020  Referring physician: Carmin Muskrat, MD PCP: Celene Squibb, MD  Patient coming from: Home  Chief Complaint: Left Hip pain  HPI: Dominic Hodges is a 87 y.o. male with medical history significant for Hypertension, HLD who presents to the ED accompanied by wife due to left hip pain.  Patient lost his balance while ambulating with a walker and accidentally sustained a fall landing on his left hip with a subsequent severe sharp pain at the hip.  He denies hitting his head or losing consciousness.  Patient denies chest pain or shortness of breath.  He was well prior to the fall.  EMS was activated and patient was sent to the ED for further evaluation and management.  ED Course:  In the emergency department, he was hemodynamically stable.  BP was 153/68, other vital signs were within normal range.  Work-up in the ED showed normal CBC, BUN/creatinine 30/1.33 (baseline creatinine at 1.4), eGFR 51. Left hip x-ray showed comminuted left femoral neck fracture, extending from the mid left femoral neck through the greater trochanter. Significant impaction and varus angulation.  Review of Systems: Constitutional: Negative for chills and fever.  HENT: Negative for ear pain and sore throat.   Eyes: Negative for pain and visual disturbance.  Respiratory: Negative for cough, chest tightness and shortness of breath.   Cardiovascular: Negative for chest pain and palpitations.  Gastrointestinal: Negative for abdominal pain and vomiting.  Endocrine: Negative for polyphagia and polyuria.  Genitourinary: Negative for decreased urine volume, dysuria, enuresis, hematuria Musculoskeletal: Positive for left hip pain.  Negative for back pain.  Skin: Negative for color change and rash.  Allergic/Immunologic: Negative for immunocompromised state.  Neurological: Negative for tremors, syncope, speech difficulty, weakness,  light-headedness and headaches.  Hematological: Does not bruise/bleed easily.  All other systems reviewed and are negative   Past Medical History:  Diagnosis Date  . Acid reflux    mild-states if doesnt chew food completely he begins to cough- states has weakness in muscles due to ilieus- has been doing exreci this  . Adynamic ileus ([place]) 12/2014   ? anesthesia related per pt  . BPH (benign prostatic hyperplasia)   . Complication of anesthesia    10/11/15- per pt "had [place]- in 11/16-related it to possibly anesthesia"  . H/O seasonal allergies   . Hyperlipidemia   . Hypertension    Past Surgical History:  Procedure Laterality Date  . APPENDECTOMY     approx 30 years ago/lwb  . CATARACT EXTRACTION W/PHACO Left 09/11/2012   Procedure: CATARACT EXTRACTION PHACO AND INTRAOCULAR LENS PLACEMENT (IOC);  Surgeon: Tonny Branch, MD;  Location: [facility];  Service: Ophthalmology;  Laterality: Left;  CDE: 19.52  . HIP ARTHROPLASTY Right 12/06/2014   Procedure: PARTIAL RIGHT HIP REPLACEMENT;  Surgeon: Carole Civil, MD;  Location: [facility];  Service: Orthopedics;  Laterality: Right;  . LUMBAR LAMINECTOMY/DECOMPRESSION MICRODISCECTOMY N/A 10/19/2015   Procedure: CENTRAL DECOMPRESSION LUMBAR LAMINECTOMY FOR SPINAL STENOSIS, L3,L4FORAMINOTOMY BILATERAL L3,L4 NERVE ROOT;  Surgeon: Latanya Maudlin, MD;  Location: [facility];  Service: Orthopedics;  Laterality: N/A;  . UMBILICAL HERNIA REPAIR     Approx 10 years ago/lwb  . ureteral stone extraction      Social History:  reports that he has quit smoking. His smoking use included pipe. He has never used smokeless tobacco. He reports current alcohol use. He reports that he does not use drugs.  Allergies  Allergen Reactions  . Robaxin [Methocarbamol] Other (See Comments)    "made me crazy"  . Robaxin [Methocarbamol] Other (See Comments)    Mental confusion    Family History  Problem Relation Age of Onset  . Coronary  artery disease Other        no family history of premature CAD     Prior to Admission medications   Medication Sig Start Date End Date Taking? Authorizing Provider  aspirin EC 81 MG tablet Take 81 mg by mouth daily. Patient not taking: Reported on 11/19/2019    [provider]  cholecalciferol (VITAMIN D) 1000 units tablet Take 5,000 Units by mouth daily. Patient not taking: Reported on 11/19/2019    [provider]  cloNIDine (CATAPRES - DOSED IN MG/24 HR) 0.1 mg/24hr patch Place 0.1 mg onto the skin once a week. TUESDAY    [provider]  cloNIDine (CATAPRES - DOSED IN MG/24 HR) 0.1 mg/24hr patch 0.1 mg once a week. 10/02/19   [provider]  fluorometholone (FML) 0.1 % ophthalmic suspension SMARTSIG:In Eye(s) 11/12/19   [provider]  HYDROcodone-acetaminophen (NORCO/VICODIN) 5-325 MG tablet Take 1-2 tablets by mouth every 4 (four) hours as needed (mild pain). Patient not taking: Reported on 11/19/2019 10/19/15   Latanya Maudlin, MD  nystatin cream (MYCOSTATIN) Apply topically 2 (two) times daily. 12/28/19   [provider]  nystatin-triamcinolone (MYCOLOG II) cream Apply topically 2 (two) times daily. 01/29/20   [provider]  predniSONE (STERAPRED UNI-PAK 48 TAB) 10 MG (48) TBPK tablet TAKE AS DIRECTED PER PACKAGE INSTRUCTIONS. Patient not taking: Reported on 11/19/2019 10/17/18   Carole Civil, MD  rosuvastatin (CRESTOR) 20 MG tablet Take 20 mg by mouth daily.   Patient not taking: Reported on 11/19/2019    [provider]    Physical Exam: BP (!) 147/63   Pulse 84   Temp 98 F (36.7 C) (Oral)   Resp 14   Ht 6' 0.5" (1.842 m)   Wt 77.1 kg   SpO2 97%   BMI 22.74 kg/m   . General: 87 y.o. year-old male well developed well nourished in no acute distress.  Alert and oriented x3. Marland Kitchen HEENT: NCAT, EOMI . Neck: Supple, trachea medial . Cardiovascular: Regular rate and rhythm with no rubs or gallops.  No  thyromegaly or JVD noted.  No lower extremity edema. 2/4 pulses in all 4 extremities. Marland Kitchen Respiratory: Clear to auscultation with no wheezes or rales. Good inspiratory effort. . Abdomen: Soft nontender nondistended with normal bowel sounds x4 quadrants. . Muskuloskeletal: Tender to palpation of left hip.  Restricted LLE range of motion due to left hip pain.  No cyanosis or clubbing noted bilaterally . Neuro: CN II-XII intact, sensation, reflexes intact . Skin: No ulcerative lesions noted or rashes . Psychiatry: Judgement and insight appear normal. Mood is appropriate for condition and setting          Labs on Admission:  Basic Metabolic Panel: Recent Labs  Lab 03/07/20 1959  NA 142  K 4.0  CL 105  CO2 26  GLUCOSE 108*  BUN 30*  CREATININE 1.33*  CALCIUM 8.9   Liver Function Tests: No results for input(s): AST, ALT, ALKPHOS, BILITOT, PROT, ALBUMIN in the last 168 hours. No results for input(s): LIPASE, AMYLASE in the last 168 hours. No results for input(s): AMMONIA in the last 168 hours. CBC: Recent Labs  Lab 03/07/20 1959  WBC 8.5  NEUTROABS 6.5  HGB 13.0  HCT 39.9  MCV 96.4  PLT 183   Cardiac Enzymes: No results for input(s): CKTOTAL, CKMB, CKMBINDEX, TROPONINI in the last 168 hours.  BNP (last 3 results) No results for input(s): BNP in the last 8760 hours.  ProBNP (last 3 results) No results for input(s): PROBNP in the last 8760 hours.  CBG: No results for input(s): GLUCAP in the last 168 hours.  Radiological Exams on Admission: DG Hip Unilat With Pelvis 2-3 Views Left  Result Date: 03/07/2020 CLINICAL DATA:  [place], left hip pain EXAM: DG HIP (WITH OR WITHOUT PELVIS) 2-3V LEFT COMPARISON:  02/01/2015 FINDINGS: Frontal view of the pelvis as well as frontal and cross-table lateral views of the left hip are obtained. There is a comminuted left femoral neck fracture. Fracture line involves the mid aspect of the left femoral neck, and extends through the greater  trochanter. There is impaction and valgus angulation. No dislocation. Moderate left hip osteoarthritis. Right hip hemiarthroplasty is unremarkable. The remainder of the bony pelvis is normal. IMPRESSION: 1. Comminuted left femoral neck fracture, extending from the mid left femoral neck through the greater trochanter. Significant impaction and varus angulation. Electronically Signed   By: Randa Ngo M.D.   On: 03/07/2020 20:00    EKG: I independently viewed the EKG done and my findings are as followed: Normal sinus rhythm at a rate of 84 bpm  Assessment/Plan Present on Admission: . Closed left femoral fracture ([place]) . Essential hypertension, benign . Hyperlipidemia  Active Problems:   Essential hypertension, benign   Hyperlipidemia   Closed left femoral fracture (HCC)   Accidental fall   CKD (chronic kidney disease), stage III (HCC)  Closed left femoral fracture secondary to accidental fall Left hip x-ray showed comminuted left femoral neck fracture Patient was started on IV fentanyl, this will be changed to IV morphine 2 mg every 4 hours as needed Orthopedic surgery was already consulted and will see patient in the morning per ED physician  Chronic kidney disease stage IIIA Stable,BUN/creatinine 30/1.33 (baseline creatinine at 1.4), eGFR 51. Renally adjust medications, avoid nephrotoxic agents/dehydration/hypotension  Essential hypertension Continue clonidine patch per home regimen  Hyperlipidemia Patient no longer taking Crestor due to cholesterol level being good per med rec  DVT prophylaxis: Lovenox  Code Status: Full code  Family Communication: Wife at bedside (all questions answered to satisfaction)  Disposition Plan:  Patient is from:                        home Anticipated DC to:                   SNF or family members home Anticipated DC date:               2-3 days Anticipated DC barriers:          Patient requires inpatient management of left hip pain and  pending orthopedic surgeon consult for possible hip repair  Consults called: Orthopedic surgery  Admission status: Inpatient    Bernadette Hoit MD [facility]  03/07/2020, 9:33 PM

## 2020-03-07 NOTE — ED Provider Notes (Signed)
[facility] EMERGENCY DEPARTMENT Provider Note   CSN: [id number] Arrival date & time: 03/07/20  1914     History Chief Complaint  Patient presents with  . Hip Pain    Dominic Hodges is a 87 y.o. male.  HPI Patient presents immediately after a fall. Patient notes that he got his feet tangled while using his walker, fell.  Since that time has been nonambulatory due to severe sharp pain in his left hip. He denies pain in other areas, did not lose consciousness. He was well prior to the fall. Pain is currently severe sharp, nonradiating.    Past Medical History:  Diagnosis Date  . Acid reflux    mild-states if doesnt chew food completely he begins to cough- states has weakness in muscles due to ilieus- has been doing exreci this  . Adynamic ileus ([place]) 12/2014   ? anesthesia related per pt  . BPH (benign prostatic hyperplasia)   . Complication of anesthesia    10/11/15- per pt "had [place]- in 11/16-related it to possibly anesthesia"  . H/O seasonal allergies   . Hyperlipidemia   . Hypertension     Patient Active Problem List   Diagnosis Date Noted  . Closed left femoral fracture ([place]) 03/07/2020  . Accidental fall 03/07/2020  . CKD (chronic kidney disease), stage III ([place]) 03/07/2020  . Spinal stenosis, lumbar region, with neurogenic claudication 10/19/2015  . Closed right hip fracture ([place]) 12/05/2014  . Closed hip fracture ([place]) 12/05/2014  . Hypertension 12/05/2014  . BPH (benign prostatic hyperplasia) 12/05/2014  . Hyperlipidemia 12/05/2014  . Essential hypertension, benign 06/01/2010  . NUMBNESS/TINGLING 11/20/2006  . BACK PAIN 11/20/2006    Past Surgical History:  Procedure Laterality Date  . APPENDECTOMY     approx 30 years ago/lwb  . CATARACT EXTRACTION W/PHACO Left 09/11/2012   Procedure: CATARACT EXTRACTION PHACO AND INTRAOCULAR LENS PLACEMENT (IOC);  Surgeon: Tonny Branch, MD;  Location: [facility];  Service: Ophthalmology;   Laterality: Left;  CDE: 19.52  . HIP ARTHROPLASTY Right 12/06/2014   Procedure: PARTIAL RIGHT HIP REPLACEMENT;  Surgeon: Carole Civil, MD;  Location: [facility];  Service: Orthopedics;  Laterality: Right;  . LUMBAR LAMINECTOMY/DECOMPRESSION MICRODISCECTOMY N/A 10/19/2015   Procedure: CENTRAL DECOMPRESSION LUMBAR LAMINECTOMY FOR SPINAL STENOSIS, L3,L4FORAMINOTOMY BILATERAL L3,L4 NERVE ROOT;  Surgeon: Latanya Maudlin, MD;  Location: [facility];  Service: Orthopedics;  Laterality: N/A;  . UMBILICAL HERNIA REPAIR     Approx 10 years ago/lwb  . ureteral stone extraction         Family History  Problem Relation Age of Onset  . Coronary artery disease Other        no family history of premature CAD    Social History   Tobacco Use  . Smoking status: Former Smoker    Types: Pipe  . Smokeless tobacco: Never Used  Substance Use Topics  . Alcohol use: Yes    Comment: occasional social drink/lwb  . Drug use: No    Home Medications Prior to Admission medications   Medication Sig Start Date End Date Taking? Authorizing Provider  cloNIDine (CATAPRES - DOSED IN MG/24 HR) 0.1 mg/24hr patch Place 0.1 mg onto the skin once a week. TUESDAY   Yes [provider]  fluorometholone (FML) 0.1 % ophthalmic suspension Place 1 drop into both eyes 2 (two) times daily. 11/12/19  Yes [provider]  aspirin EC 81 MG tablet Take 81 mg by mouth daily. Patient not taking: Reported  on 11/19/2019    [provider]  cholecalciferol (VITAMIN D) 1000 units tablet Take 5,000 Units by mouth daily. Patient not taking: Reported on 11/19/2019    [provider]  HYDROcodone-acetaminophen (NORCO/VICODIN) 5-325 MG tablet Take 1-2 tablets by mouth every 4 (four) hours as needed (mild pain). Patient not taking: No sig reported 10/19/15   Latanya Maudlin, MD  nystatin cream (MYCOSTATIN) Apply topically 2 (two) times daily. Patient not taking: No sig reported 12/28/19   [provider]  nystatin-triamcinolone (MYCOLOG II) cream Apply topically 2 (two) times daily. Patient not taking: No sig reported 01/29/20   [provider]  predniSONE (STERAPRED UNI-PAK 48 TAB) 10 MG (48) TBPK tablet TAKE AS DIRECTED PER PACKAGE INSTRUCTIONS. Patient not taking: No sig reported 10/17/18   Carole Civil, MD  rosuvastatin (CRESTOR) 20 MG tablet Take 20 mg by mouth daily.   Patient not taking: Reported on 11/19/2019    [provider]    Allergies    Robaxin [methocarbamol] and Robaxin [methocarbamol]  Review of Systems   Review of Systems  Constitutional:       Per HPI, otherwise negative  HENT:       Per HPI, otherwise negative  Respiratory:       Per HPI, otherwise negative  Cardiovascular:       Per HPI, otherwise negative  Gastrointestinal: Negative for vomiting.  Endocrine:       Negative aside from HPI  Genitourinary:       Neg aside from HPI   Musculoskeletal:       Per HPI, otherwise negative  Skin: Negative.   Neurological: Negative for syncope.    Physical Exam Updated Vital Signs BP (!) 161/65 (BP Location: Left Arm)   Pulse 79   Temp 98.4 F (36.9 C)   Resp 19   Ht 6' 0.5" (1.842 m)   Wt 77.1 kg   SpO2 96%   BMI 22.74 kg/m   Physical Exam Vitals and nursing note reviewed.  Constitutional:      General: He is not in acute distress.    Appearance: He is well-developed.     Comments: Thin elderly male awake and alert speaking clearly  HENT:     Head: Normocephalic and atraumatic.  Eyes:     Extraocular Movements: EOM normal.     Conjunctiva/sclera: Conjunctivae normal.  Cardiovascular:     Rate and Rhythm: Normal rate and regular rhythm.  Pulmonary:     Effort: Pulmonary effort is normal. No respiratory distress.     Breath sounds: No stridor.  Abdominal:     General: There is no distension.  Musculoskeletal:        General: No edema.     Comments: Patient unwilling moves left hip secondary to pain, with tenderness  to palpation. Left knee, left ankle grossly unremarkable, right hip unremarkable.  Skin:    General: Skin is warm and dry.  Neurological:     Mental Status: He is alert and oriented to person, place, and time.  Psychiatric:        Mood and Affect: Mood and affect normal.     ED Results / Procedures / Treatments   Labs (all labs ordered are listed, but only abnormal results are displayed) Labs Reviewed  BASIC METABOLIC PANEL - Abnormal; Notable for the following components:      Result Value   Glucose, Bld 108 (*)    BUN 30 (*)    Creatinine, Ser 1.33 (*)  GFR, Estimated 51 (*)    All other components within normal limits  CBC WITH DIFFERENTIAL/PLATELET - Abnormal; Notable for the following components:   RBC 4.14 (*)    All other components within normal limits  PROTIME-INR  COMPREHENSIVE METABOLIC PANEL  CBC  PROTIME-INR  APTT  MAGNESIUM  PHOSPHORUS  POC SARS CORONAVIRUS 2 AG -  ED  TYPE AND SCREEN    EKG EKG Interpretation  Date/Time:  Monday March 07 2020 19:26:27 EST Ventricular Rate:  84 PR Interval:    QRS Duration: 120 QT Interval:  378 QTC Calculation: 447 R Axis:   -63 Text Interpretation: Sinus rhythm Ventricular premature complex Nonspecific IVCD with LAD Artifact Abnormal ECG Confirmed by Carmin Muskrat [phone]) on 03/07/2020 7:36:42 PM   Radiology DG Hip Unilat With Pelvis 2-3 Views Left  Result Date: 03/07/2020 CLINICAL DATA:  [place], left hip pain EXAM: DG HIP (WITH OR WITHOUT PELVIS) 2-3V LEFT COMPARISON:  02/01/2015 FINDINGS: Frontal view of the pelvis as well as frontal and cross-table lateral views of the left hip are obtained. There is a comminuted left femoral neck fracture. Fracture line involves the mid aspect of the left femoral neck, and extends through the greater trochanter. There is impaction and valgus angulation. No dislocation. Moderate left hip osteoarthritis. Right hip hemiarthroplasty is unremarkable. The remainder of the bony  pelvis is normal. IMPRESSION: 1. Comminuted left femoral neck fracture, extending from the mid left femoral neck through the greater trochanter. Significant impaction and varus angulation. Electronically Signed   By: Randa Ngo M.D.   On: 03/07/2020 20:00    Procedures Procedures   Medications Ordered in ED Medications  fentaNYL (SUBLIMAZE) injection 12.5 mcg (12.5 mcg Intravenous Given 03/07/20 2044)    ED Course  I have reviewed the triage vital signs and the nursing notes.  Pertinent labs & imaging results that were available during my care of the patient were reviewed by me and considered in my medical decision making (see chart for details).  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  Patient is awake, alert, distally neurovascularly intact, has no other complaints, but is found to have comminuted left femoral neck fracture. Discussed his case with her surgical colleagues, internal medicine colleagues, for admission for surgical repair. Final Clinical Impression(s) / ED Diagnoses Final diagnoses:  Fall, initial encounter  Closed fracture of neck of left femur, initial encounter [facility])     Carmin Muskrat, MD 03/07/20 2303

## 2020-03-08 ENCOUNTER — Inpatient Hospital Stay (HOSPITAL_COMMUNITY): Payer: Medicare Other

## 2020-03-08 ENCOUNTER — Inpatient Hospital Stay (HOSPITAL_COMMUNITY): Payer: Medicare Other | Admitting: Certified Registered"

## 2020-03-08 ENCOUNTER — Encounter (HOSPITAL_COMMUNITY)
Admission: EM | Disposition: A | Discharge status: Skilled Nursing Facility | Payer: Self-pay | Source: Home / Self Care | Attending: Internal Medicine

## 2020-03-08 ENCOUNTER — Encounter (HOSPITAL_COMMUNITY): Payer: Self-pay | Admitting: Internal Medicine

## 2020-03-08 DIAGNOSIS — I10 Essential (primary) hypertension: Secondary | ICD-10-CM | POA: Diagnosis not present

## 2020-03-08 DIAGNOSIS — W19XXXA Unspecified fall, initial encounter: Secondary | ICD-10-CM | POA: Diagnosis not present

## 2020-03-08 DIAGNOSIS — S72032A Displaced midcervical fracture of left femur, initial encounter for closed fracture: Secondary | ICD-10-CM

## 2020-03-08 DIAGNOSIS — N183 Chronic kidney disease, stage 3 unspecified: Secondary | ICD-10-CM | POA: Diagnosis not present

## 2020-03-08 HISTORY — PX: HIP ARTHROPLASTY: SHX981

## 2020-03-08 LAB — APTT: aPTT: 27 seconds (ref 24–36)

## 2020-03-08 LAB — CBC
HCT: 36.4 % — ABNORMAL LOW (ref 39.0–52.0)
HCT: 33.2 % — ABNORMAL LOW (ref 39.0–52.0)
Hemoglobin: 12.2 g/dL — ABNORMAL LOW (ref 13.0–17.0)
Hemoglobin: 11.1 g/dL — ABNORMAL LOW (ref 13.0–17.0)
MCH: 32 pg (ref 26.0–34.0)
MCH: 32.6 pg (ref 26.0–34.0)
MCHC: 33.5 g/dL (ref 30.0–36.0)
MCHC: 33.4 g/dL (ref 30.0–36.0)
MCV: 95.5 fL (ref 80.0–100.0)
MCV: 97.4 fL (ref 80.0–100.0)
Platelets: 177 10*3/uL (ref 150–400)
Platelets: 169 10*3/uL (ref 150–400)
RBC: 3.81 MIL/uL — ABNORMAL LOW (ref 4.22–5.81)
RBC: 3.41 MIL/uL — ABNORMAL LOW (ref 4.22–5.81)
RDW: 12.5 % (ref 11.5–15.5)
RDW: 12.7 % (ref 11.5–15.5)
WBC: 10.5 10*3/uL (ref 4.0–10.5)
WBC: 15.1 10*3/uL — ABNORMAL HIGH (ref 4.0–10.5)
nRBC: 0 % (ref 0.0–0.2)
nRBC: 0 % (ref 0.0–0.2)

## 2020-03-08 LAB — COMPREHENSIVE METABOLIC PANEL
ALT: 14 U/L (ref 0–44)
AST: 19 U/L (ref 15–41)
Albumin: 3.7 g/dL (ref 3.5–5.0)
Alkaline Phosphatase: 48 U/L (ref 38–126)
Anion gap: 9 (ref 5–15)
BUN: 27 mg/dL — ABNORMAL HIGH (ref 8–23)
CO2: 25 mmol/L (ref 22–32)
Calcium: 8.4 mg/dL — ABNORMAL LOW (ref 8.9–10.3)
Chloride: 106 mmol/L (ref 98–111)
Creatinine, Ser: 1.13 mg/dL (ref 0.61–1.24)
GFR, Estimated: >60 mL/min (ref >60)
Glucose, Bld: 116 mg/dL — ABNORMAL HIGH (ref 70–99)
Potassium: 4.1 mmol/L (ref 3.5–5.1)
Sodium: 140 mmol/L (ref 135–145)
Total Bilirubin: 1.1 mg/dL (ref 0.3–1.2)
Total Protein: 6 g/dL — ABNORMAL LOW (ref 6.5–8.1)

## 2020-03-08 LAB — SURGICAL PCR SCREEN
MRSA, PCR: NEGATIVE
Staphylococcus aureus: NEGATIVE

## 2020-03-08 LAB — PROTIME-INR
INR: 1.2 (ref 0.8–1.2)
Prothrombin Time: 14.4 seconds (ref 11.4–15.2)

## 2020-03-08 LAB — PHOSPHORUS: Phosphorus: 3.8 mg/dL (ref 2.5–4.6)

## 2020-03-08 LAB — GLUCOSE, CAPILLARY: Glucose-Capillary: 118 mg/dL — ABNORMAL HIGH (ref 70–99)

## 2020-03-08 LAB — MAGNESIUM: Magnesium: 2 mg/dL (ref 1.7–2.4)

## 2020-03-08 SURGERY — HEMIARTHROPLASTY, HIP, DIRECT ANTERIOR APPROACH, FOR FRACTURE
Anesthesia: General | Site: Hip | Laterality: Left

## 2020-03-08 MED ORDER — BUPIVACAINE HCL (PF) 0.5 % IJ SOLN
INTRAMUSCULAR | Status: AC
  Filled 2020-03-08: qty 30

## 2020-03-08 MED ORDER — TRANEXAMIC ACID-NACL 1000-0.7 MG/100ML-% IV SOLN
1000.0000 mg | INTRAVENOUS | Status: AC
  Administered 2020-03-08: 1000 mg via INTRAVENOUS

## 2020-03-08 MED ORDER — ONDANSETRON HCL 4 MG/2ML IJ SOLN: 4.0000 mg | Freq: Once | INTRAMUSCULAR | Status: DC | PRN

## 2020-03-08 MED ORDER — CEFAZOLIN SODIUM-DEXTROSE 2-4 GM/100ML-% IV SOLN
2.0000 g | INTRAVENOUS | Status: AC
  Administered 2020-03-08: 2 g via INTRAVENOUS

## 2020-03-08 MED ORDER — OXYCODONE HCL 5 MG PO TABS
5.0000 mg | ORAL_TABLET | ORAL | Status: DC | PRN
  Administered 2020-03-10 (×2): 5 mg via ORAL
  Filled 2020-03-08 (×2): qty 1

## 2020-03-08 MED ORDER — CEFAZOLIN SODIUM-DEXTROSE 2-4 GM/100ML-% IV SOLN
INTRAVENOUS | Status: AC
  Administered 2020-03-08: 2 g via INTRAVENOUS
  Filled 2020-03-08: qty 100

## 2020-03-08 MED ORDER — GLYCOPYRROLATE 0.2 MG/ML IJ SOLN
INTRAMUSCULAR | Status: DC | PRN
  Administered 2020-03-08: .1 mg via INTRAVENOUS

## 2020-03-08 MED ORDER — VANCOMYCIN HCL 1000 MG IV SOLR
INTRAVENOUS | Status: DC | PRN
  Administered 2020-03-08: 1000 mg

## 2020-03-08 MED ORDER — PROPOFOL 10 MG/ML IV BOLUS
INTRAVENOUS | Status: AC
  Filled 2020-03-08: qty 40

## 2020-03-08 MED ORDER — MUPIROCIN 2 % EX OINT
1.0000 "application " | TOPICAL_OINTMENT | Freq: Two times a day (BID) | CUTANEOUS | Status: DC
  Administered 2020-03-09 – 2020-03-11 (×5): 1 via NASAL
  Filled 2020-03-08: qty 22

## 2020-03-08 MED ORDER — PHENYLEPHRINE 40 MCG/ML (10ML) SYRINGE FOR IV PUSH (FOR BLOOD PRESSURE SUPPORT)
PREFILLED_SYRINGE | INTRAVENOUS | Status: AC
  Filled 2020-03-08: qty 10

## 2020-03-08 MED ORDER — LIDOCAINE HCL (PF) 2 % IJ SOLN
INTRAMUSCULAR | Status: AC
  Filled 2020-03-08: qty 5

## 2020-03-08 MED ORDER — FENTANYL CITRATE (PF) 100 MCG/2ML IJ SOLN
INTRAMUSCULAR | Status: AC
  Filled 2020-03-08: qty 2

## 2020-03-08 MED ORDER — SODIUM CHLORIDE 0.9 % IR SOLN
Status: DC | PRN
  Administered 2020-03-08: 3000 mL

## 2020-03-08 MED ORDER — LIDOCAINE 2% (20 MG/ML) 5 ML SYRINGE
INTRAMUSCULAR | Status: DC | PRN
  Administered 2020-03-08: 50 mg via INTRAVENOUS

## 2020-03-08 MED ORDER — ONDANSETRON HCL 4 MG/2ML IJ SOLN
INTRAMUSCULAR | Status: DC | PRN
  Administered 2020-03-08: 4 mg via INTRAVENOUS

## 2020-03-08 MED ORDER — LABETALOL HCL 5 MG/ML IV SOLN: 10.0000 mg | INTRAVENOUS | Status: DC | PRN

## 2020-03-08 MED ORDER — SUCCINYLCHOLINE CHLORIDE 200 MG/10ML IV SOSY
PREFILLED_SYRINGE | INTRAVENOUS | Status: AC
  Filled 2020-03-08: qty 10

## 2020-03-08 MED ORDER — PROPOFOL 10 MG/ML IV BOLUS
INTRAVENOUS | Status: DC | PRN
  Administered 2020-03-08 (×2): 30 mg via INTRAVENOUS
  Administered 2020-03-08: 20 mg via INTRAVENOUS
  Administered 2020-03-08: 50 mg via INTRAVENOUS
  Administered 2020-03-08: 30 mg via INTRAVENOUS

## 2020-03-08 MED ORDER — SUGAMMADEX SODIUM 200 MG/2ML IV SOLN
INTRAVENOUS | Status: DC | PRN
  Administered 2020-03-08: 200 mg via INTRAVENOUS

## 2020-03-08 MED ORDER — ROSUVASTATIN CALCIUM 20 MG PO TABS
20.0000 mg | ORAL_TABLET | Freq: Every day | ORAL | Status: DC
  Administered 2020-03-10 – 2020-03-11 (×2): 20 mg via ORAL
  Filled 2020-03-08 (×3): qty 1

## 2020-03-08 MED ORDER — ROCURONIUM BROMIDE 10 MG/ML (PF) SYRINGE
PREFILLED_SYRINGE | INTRAVENOUS | Status: AC
  Filled 2020-03-08: qty 10

## 2020-03-08 MED ORDER — LACTATED RINGERS IV SOLN: INTRAVENOUS | Status: DC

## 2020-03-08 MED ORDER — GLYCOPYRROLATE PF 0.2 MG/ML IJ SOSY
PREFILLED_SYRINGE | INTRAMUSCULAR | Status: AC
  Filled 2020-03-08: qty 1

## 2020-03-08 MED ORDER — CEFAZOLIN SODIUM-DEXTROSE 2-4 GM/100ML-% IV SOLN
2.0000 g | Freq: Three times a day (TID) | INTRAVENOUS | Status: AC
  Administered 2020-03-09 (×2): 2 g via INTRAVENOUS
  Filled 2020-03-08 (×3): qty 100

## 2020-03-08 MED ORDER — 0.9 % SODIUM CHLORIDE (POUR BTL) OPTIME
TOPICAL | Status: DC | PRN
  Administered 2020-03-08: 1000 mL

## 2020-03-08 MED ORDER — SEVOFLURANE IN SOLN
RESPIRATORY_TRACT | Status: AC
  Filled 2020-03-08: qty 250

## 2020-03-08 MED ORDER — LACTATED RINGERS IV SOLN: INTRAVENOUS | Status: DC | PRN

## 2020-03-08 MED ORDER — CHLORHEXIDINE GLUCONATE CLOTH 2 % EX PADS
6.0000 | MEDICATED_PAD | Freq: Every day | CUTANEOUS | Status: DC
  Administered 2020-03-08 – 2020-03-11 (×3): 6 via TOPICAL

## 2020-03-08 MED ORDER — FENTANYL CITRATE (PF) 100 MCG/2ML IJ SOLN
INTRAMUSCULAR | Status: DC | PRN
  Administered 2020-03-08: 50 ug via INTRAVENOUS
  Administered 2020-03-08: 85 ug via INTRAVENOUS
  Administered 2020-03-08: 25 ug via INTRAVENOUS
  Administered 2020-03-08: 50 ug via INTRAVENOUS

## 2020-03-08 MED ORDER — SUGAMMADEX SODIUM 500 MG/5ML IV SOLN
INTRAVENOUS | Status: AC
  Filled 2020-03-08: qty 5

## 2020-03-08 MED ORDER — TRANEXAMIC ACID-NACL 1000-0.7 MG/100ML-% IV SOLN
INTRAVENOUS | Status: AC
  Filled 2020-03-08: qty 100

## 2020-03-08 MED ORDER — ORAL CARE MOUTH RINSE: 15.0000 mL | Freq: Once | OROMUCOSAL | Status: AC

## 2020-03-08 MED ORDER — EPHEDRINE 5 MG/ML INJ
INTRAVENOUS | Status: AC
  Filled 2020-03-08: qty 10

## 2020-03-08 MED ORDER — ASPIRIN 81 MG PO CHEW
81.0000 mg | CHEWABLE_TABLET | Freq: Two times a day (BID) | ORAL | Status: DC
  Administered 2020-03-09 – 2020-03-11 (×5): 81 mg via ORAL
  Filled 2020-03-08 (×5): qty 1

## 2020-03-08 MED ORDER — DEXAMETHASONE SODIUM PHOSPHATE 10 MG/ML IJ SOLN
INTRAMUSCULAR | Status: DC | PRN
  Administered 2020-03-08: 5 mg via INTRAVENOUS

## 2020-03-08 MED ORDER — EPHEDRINE SULFATE-NACL 50-0.9 MG/10ML-% IV SOSY
PREFILLED_SYRINGE | INTRAVENOUS | Status: DC | PRN
  Administered 2020-03-08: 5 mg via INTRAVENOUS

## 2020-03-08 MED ORDER — PHENYLEPHRINE HCL (PRESSORS) 10 MG/ML IV SOLN
INTRAVENOUS | Status: DC | PRN
  Administered 2020-03-08 (×8): 100 ug via INTRAVENOUS

## 2020-03-08 MED ORDER — MORPHINE SULFATE (PF) 2 MG/ML IV SOLN
2.0000 mg | INTRAVENOUS | Status: DC | PRN
  Administered 2020-03-08 – 2020-03-09 (×3): 2 mg via INTRAVENOUS
  Filled 2020-03-08 (×4): qty 1

## 2020-03-08 MED ORDER — PHENYLEPHRINE HCL (PRESSORS) 10 MG/ML IV SOLN
INTRAVENOUS | Status: AC
  Filled 2020-03-08: qty 1

## 2020-03-08 MED ORDER — ONDANSETRON HCL 4 MG/2ML IJ SOLN
INTRAMUSCULAR | Status: AC
  Filled 2020-03-08: qty 2

## 2020-03-08 MED ORDER — CHLORHEXIDINE GLUCONATE 0.12 % MT SOLN
15.0000 mL | Freq: Once | OROMUCOSAL | Status: AC
  Administered 2020-03-08: 15 mL via OROMUCOSAL

## 2020-03-08 MED ORDER — PHENYLEPHRINE 40 MCG/ML (10ML) SYRINGE FOR IV PUSH (FOR BLOOD PRESSURE SUPPORT)
PREFILLED_SYRINGE | INTRAVENOUS | Status: DC | PRN
  Administered 2020-03-08: 80 ug via INTRAVENOUS
  Administered 2020-03-08: 120 ug via INTRAVENOUS
  Administered 2020-03-08 (×2): 80 ug via INTRAVENOUS
  Administered 2020-03-08: 120 ug via INTRAVENOUS
  Administered 2020-03-08 (×4): 80 ug via INTRAVENOUS

## 2020-03-08 MED ORDER — ROCURONIUM BROMIDE 10 MG/ML (PF) SYRINGE
PREFILLED_SYRINGE | INTRAVENOUS | Status: DC | PRN
  Administered 2020-03-08 (×3): 20 mg via INTRAVENOUS
  Administered 2020-03-08: 30 mg via INTRAVENOUS

## 2020-03-08 MED ORDER — LACTATED RINGERS IV BOLUS
500.0000 mL | Freq: Once | INTRAVENOUS | Status: AC
  Administered 2020-03-08: 500 mL via INTRAVENOUS

## 2020-03-08 MED ORDER — SUCCINYLCHOLINE CHLORIDE 200 MG/10ML IV SOSY
PREFILLED_SYRINGE | INTRAVENOUS | Status: DC | PRN
  Administered 2020-03-08: 100 mg via INTRAVENOUS

## 2020-03-08 MED ORDER — BUPIVACAINE-EPINEPHRINE (PF) 0.5% -1:200000 IJ SOLN
INTRAMUSCULAR | Status: DC | PRN
  Administered 2020-03-08: 30 mL via PERINEURAL

## 2020-03-08 MED ORDER — HYDROMORPHONE HCL 1 MG/ML IJ SOLN: 0.2500 mg | INTRAMUSCULAR | Status: DC | PRN

## 2020-03-08 MED ORDER — CYCLOBENZAPRINE HCL 10 MG PO TABS
5.0000 mg | ORAL_TABLET | Freq: Every day | ORAL | Status: DC
  Administered 2020-03-08 – 2020-03-10 (×3): 5 mg via ORAL
  Filled 2020-03-08: qty 0.5
  Filled 2020-03-08 (×3): qty 1

## 2020-03-08 SURGICAL SUPPLY — 75 items
1.6mm k wire ×2 IMPLANT
18g wire ×1 IMPLANT
APL PRP STRL LF DISP 70% ISPRP (MISCELLANEOUS) ×1
BALL HIP ARTICU 28 +5 (Hips) IMPLANT
BIT DRILL 2.8X128 (BIT) ×2 IMPLANT
BLADE SAGITTAL 25.0X1.27X90 (BLADE) ×2 IMPLANT
BRUSH FEMORAL CANAL (MISCELLANEOUS) ×1 IMPLANT
CEMENT HV SMART SET (Cement) ×2 IMPLANT
CEMENT RESTRICTOR DEPUY SZ 3 (Cement) ×1 IMPLANT
CENTRALIZER STEM HIP 12MM (Hips) ×1 IMPLANT
CHLORAPREP W/TINT 26 (MISCELLANEOUS) ×2 IMPLANT
CLOTH BEACON ORANGE TIMEOUT ST (SAFETY) ×2 IMPLANT
COVER LIGHT HANDLE STERIS (MISCELLANEOUS) ×4 IMPLANT
COVER MAYO STAND XLG (MISCELLANEOUS) ×1 IMPLANT
COVER WAND RF STERILE (DRAPES) ×2 IMPLANT
DECANTER SPIKE VIAL GLASS SM (MISCELLANEOUS) ×2 IMPLANT
DRAPE C-ARM FOLDED MOBILE STRL (DRAPES) ×1 IMPLANT
DRAPE HIP W/POCKET STRL (MISCELLANEOUS) ×2 IMPLANT
DRAPE SURG 17X23 STRL (DRAPES) ×2 IMPLANT
DRAPE U-SHAPE 47X51 STRL (DRAPES) ×2 IMPLANT
DRESSING AQUACEL AG ADV 3.5X12 (MISCELLANEOUS) ×1 IMPLANT
DRSG AQUACEL AG ADV 3.5X10 (GAUZE/BANDAGES/DRESSINGS) ×1 IMPLANT
DRSG AQUACEL AG ADV 3.5X12 (MISCELLANEOUS) ×2
DRSG MEPILEX SACRM 8.7X9.8 (GAUZE/BANDAGES/DRESSINGS) ×2 IMPLANT
ELECT REM PT RETURN 9FT ADLT (ELECTROSURGICAL) ×2
ELECTRODE REM PT RTRN 9FT ADLT (ELECTROSURGICAL) ×1 IMPLANT
GLOVE SKINSENSE NS SZ8.0 LF (GLOVE) ×4
GLOVE SKINSENSE STRL SZ8.0 LF (GLOVE) ×4 IMPLANT
GLOVE SRG 8 PF TXTR STRL LF DI (GLOVE) ×1 IMPLANT
GLOVE SURG UNDER POLY LF SZ7 (GLOVE) ×6 IMPLANT
GLOVE SURG UNDER POLY LF SZ8 (GLOVE) ×2
GOWN STRL REUS W/ TWL XL LVL3 (GOWN DISPOSABLE) ×1 IMPLANT
GOWN STRL REUS W/TWL LRG LVL3 (GOWN DISPOSABLE) ×4 IMPLANT
GOWN STRL REUS W/TWL XL LVL3 (GOWN DISPOSABLE) ×2
HANDPIECE INTERPULSE COAX TIP (DISPOSABLE) ×2
HIP BALL ARTICU 28 +5 (Hips) ×2 IMPLANT
INST SET MAJOR BONE (KITS) ×2 IMPLANT
IV NS IRRIG 3000ML ARTHROMATIC (IV SOLUTION) ×2 IMPLANT
K-WIRE DBL END TROCAR 6X.062 (WIRE) ×4
K-WIRE SNGL END TROCAR 9X.062 (WIRE) ×8
KIT BLADEGUARD II DBL (SET/KITS/TRAYS/PACK) ×2 IMPLANT
KIT TURNOVER KIT A (KITS) ×2 IMPLANT
KWIRE DBL END TROCAR 6X.062 (WIRE) IMPLANT
KWIRE SNGL END TROCAR 9X.062 (WIRE) IMPLANT
MANIFOLD NEPTUNE II (INSTRUMENTS) ×2 IMPLANT
MARKER SKIN DUAL TIP RULER LAB (MISCELLANEOUS) ×2 IMPLANT
NDL HYPO 18GX1.5 BLUNT FILL (NEEDLE) ×1 IMPLANT
NDL HYPO 21X1.5 SAFETY (NEEDLE) ×1 IMPLANT
NEEDLE HYPO 18GX1.5 BLUNT FILL (NEEDLE) ×2 IMPLANT
NEEDLE HYPO 21X1.5 SAFETY (NEEDLE) ×2 IMPLANT
NS IRRIG 1000ML POUR BTL (IV SOLUTION) ×2 IMPLANT
PACK SURGICAL SETUP 50X90 (CUSTOM PROCEDURE TRAY) ×2 IMPLANT
PACK TOTAL JOINT (CUSTOM PROCEDURE TRAY) ×2 IMPLANT
PAD ARMBOARD 7.5X6 YLW CONV (MISCELLANEOUS) ×2 IMPLANT
PASSER SUT SWANSON 36MM LOOP (INSTRUMENTS) IMPLANT
PENCIL SMOKE EVACUATOR (MISCELLANEOUS) ×2 IMPLANT
PILLOW ABDUCTION MEDIUM (MISCELLANEOUS) ×1 IMPLANT
PILLOW HIP ABDUCTION LRG (ORTHOPEDIC SUPPLIES) ×1 IMPLANT
PRESSURIZER FEMORAL UNIV (MISCELLANEOUS) ×1 IMPLANT
SET BASIN LINEN APH (SET/KITS/TRAYS/PACK) ×2 IMPLANT
SET HNDPC FAN SPRY TIP SCT (DISPOSABLE) ×1 IMPLANT
STAPLER VISISTAT (STAPLE) ×2 IMPLANT
STEM SUMMIT CEMENT BASIC SZ7 (Hips) ×1 IMPLANT
STRIP CLOSURE SKIN 1/2X4 (GAUZE/BANDAGES/DRESSINGS) ×3 IMPLANT
SUT BRALON NAB BRD #1 30IN (SUTURE) ×4 IMPLANT
SUT MNCRL AB 4-0 PS2 18 (SUTURE) ×4 IMPLANT
SUT MON AB 2-0 CT1 36 (SUTURE) ×2 IMPLANT
SUT VIC AB 1 CT1 27 (SUTURE) ×12
SUT VIC AB 1 CT1 27XBRD ANTBC (SUTURE) ×4 IMPLANT
SYR 20ML LL LF (SYRINGE) ×6 IMPLANT
SYR BULB IRRIG 60ML STRL (SYRINGE) ×2 IMPLANT
TOWER CARTRIDGE SMART MIX (DISPOSABLE) ×2 IMPLANT
TRAY FOLEY MTR SLVR 16FR STAT (SET/KITS/TRAYS/PACK) ×2 IMPLANT
WATER STERILE IRR 1000ML POUR (IV SOLUTION) ×4 IMPLANT
YANKAUER SUCT 12FT TUBE ARGYLE (SUCTIONS) ×2 IMPLANT

## 2020-03-08 NOTE — NC FL2 (Signed)
[place] MEDICAID FL2 LEVEL OF CARE SCREENING TOOL     IDENTIFICATION  Patient Name: Dominic Hodges Birthdate: April 02, 1927 Sex: male Admission Date (Current Location): 03/07/2020  [facility] and [place] Number:  [employer] and Address:  [place] [address], [place]      Leviathan Macera Number: [id number]  Attending Physician Name and Address:  Roxan Hockey, MD  Relative Name and Phone Number:       Current Level of Care: Hospital Recommended Level of Care: [place] Prior Approval Number:    Date Approved/Denied:   PASRR Number: [id number]  Discharge Plan: SNF    Current Diagnoses: Patient Active Problem List   Diagnosis Date Noted  . Closed left femoral fracture ([place]) 03/07/2020  . Accidental fall 03/07/2020  . CKD (chronic kidney disease), stage III ([place]) 03/07/2020  . Spinal stenosis, lumbar region, with neurogenic claudication 10/19/2015  . Closed right hip fracture ([place]) 12/05/2014  . Closed hip fracture ([place]) 12/05/2014  . Hypertension 12/05/2014  . BPH (benign prostatic hyperplasia) 12/05/2014  . Hyperlipidemia 12/05/2014  . Essential hypertension, benign 06/01/2010  . NUMBNESS/TINGLING 11/20/2006  . BACK PAIN 11/20/2006    Orientation RESPIRATION BLADDER Height & Weight     Self,Time,Situation,Place  Normal External catheter Weight: 170 lb 3.1 oz (77.2 kg) Height:  6' 0.5" (184.2 cm)  BEHAVIORAL SYMPTOMS/MOOD NEUROLOGICAL BOWEL NUTRITION STATUS      Continent Diet (heart healthy. see d/c summary for updates.)  AMBULATORY STATUS COMMUNICATION OF NEEDS Skin   Extensive Assist Verbally Surgical wounds                       Personal Care Assistance Level of Assistance  Bathing,Feeding,Dressing Bathing Assistance: Maximum assistance Feeding assistance: Limited assistance Dressing Assistance: Maximum assistance     Functional Limitations Info  Sight,Hearing,Speech Sight Info:  Impaired Hearing Info: Adequate Speech Info: Adequate    SPECIAL CARE FACTORS FREQUENCY  PT (By licensed PT)     PT Frequency: 5x weekly              Contractures Contractures Info: Not present    Additional Factors Info  Code Status,Allergies Code Status Info: Full code Allergies Info: Robaxin (methocarbamol)           Current Medications (03/08/2020):  This is the current hospital active medication list Current Facility-Administered Medications  Medication Dose Route Frequency Dredyn Gubbels Last Rate Last Admin  . ceFAZolin (ANCEF) 2-4 GM/100ML-% IVPB           . [MAR Hold] ceFAZolin (ANCEF) IVPB 2g/100 mL premix  2 g Intravenous On Call to OR Mordecai Rasmussen, MD      . Doug Sou Hold] cloNIDine (CATAPRES - Dosed in mg/24 hr) patch 0.1 mg  0.1 mg Transdermal Weekly Adefeso, Oladapo, DO      . [MAR Hold] labetalol (NORMODYNE) injection 10 mg  10 mg Intravenous Q4H PRN Roxan Hockey, MD      . Doug Sou Hold] morphine 2 MG/ML injection 2 mg  2 mg Intravenous Q4H PRN Adefeso, Oladapo, DO   2 mg at 03/08/20 0848  . [MAR Hold] mupirocin ointment (BACTROBAN) 2 % 1 application  1 application Nasal BID Adefeso, Oladapo, DO      . [MAR Hold] rosuvastatin (CRESTOR) tablet 20 mg  20 mg Oral Daily Emokpae, Courage, MD      . Doug Sou Hold] tranexamic acid (CYKLOKAPRON) IVPB 1,000 mg  1,000 mg Intravenous To OR Larena Glassman  A, MD         Discharge Medications: Please see discharge summary for a list of discharge medications.  Relevant Imaging Results:  Relevant Lab Results:   Additional Information SSN: [id number]. Pt has received COVID booster.  Salome Arnt, LCSW

## 2020-03-08 NOTE — Consult Note (Signed)
ORTHOPAEDIC CONSULTATION  REQUESTING PHYSICIAN: Roxan Hockey, MD  ASSESSMENT AND PLAN: 87 y.o. male with the following: Left Hip Displaced femoral neck fracture  This patient requires inpatient admission to the hospitalist, to include preoperative clearance and perioperative medical management  - Weight Bearing Status/Activity: NWB Left lower extremity  - Additional recommended labs/tests: Preop Labs: CBC, BMP, PT/INR, Chest XR and EKG  -VTE Prophylaxis: Please hold prior to OR; to resume POD#1 at the discretion of the primary team  - Pain control: Recommend PO pain medications PRN; judicious use of narcotics  - Follow-up plan: F/u 10-14 days postop  -Procedures: Plan for OR once patient has been medically optimized  Plan for Left Hip hemiarthroplasty   Plan for surgery this afternoon, 03/08/2020     Chief Complaint: Left hip pain  HPI: Dominic Hodges is a 87 y.o. male who presented to the ED for evaluation after sustaining a mechanical fall.  He tripped over his walker and landed on his left side.  He is complaining of pain in his left hip.  It is controlled with medications and when he lays still.  No numbness or tingling.  He is unable to ambulate.  He was brought to the ED by EMS.  He previously had a right hip hemiarthroplasty in 2016 with Dr. Aline Brochure.   Past Medical History:  Diagnosis Date  . Acid reflux    mild-states if doesnt chew food completely he begins to cough- states has weakness in muscles due to ilieus- has been doing exreci this  . Adynamic ileus ([place]) 12/2014   ? anesthesia related per pt  . BPH (benign prostatic hyperplasia)   . Complication of anesthesia    10/11/15- per pt "had [place]- in 11/16-related it to possibly anesthesia"  . H/O seasonal allergies   . Hyperlipidemia   . Hypertension    Past Surgical History:  Procedure Laterality Date  . APPENDECTOMY     approx 30 years ago/lwb  . CATARACT  EXTRACTION W/PHACO Left 09/11/2012   Procedure: CATARACT EXTRACTION PHACO AND INTRAOCULAR LENS PLACEMENT (IOC);  Surgeon: Tonny Branch, MD;  Location: [facility];  Service: Ophthalmology;  Laterality: Left;  CDE: 19.52  . HIP ARTHROPLASTY Right 12/06/2014   Procedure: PARTIAL RIGHT HIP REPLACEMENT;  Surgeon: Carole Civil, MD;  Location: [facility];  Service: Orthopedics;  Laterality: Right;  . LUMBAR LAMINECTOMY/DECOMPRESSION MICRODISCECTOMY N/A 10/19/2015   Procedure: CENTRAL DECOMPRESSION LUMBAR LAMINECTOMY FOR SPINAL STENOSIS, L3,L4FORAMINOTOMY BILATERAL L3,L4 NERVE ROOT;  Surgeon: Latanya Maudlin, MD;  Location: [facility];  Service: Orthopedics;  Laterality: N/A;  . UMBILICAL HERNIA REPAIR     Approx 10 years ago/lwb  . ureteral stone extraction     Social History   Socioeconomic History  . Marital status: Married    Spouse name: Not on file  . Number of children: Not on file  . Years of education: Not on file  . Highest education level: Not on file  Occupational History  . Not on file  Tobacco Use  . Smoking status: Former Smoker    Types: Pipe  . Smokeless tobacco: Never Used  Substance and Sexual Activity  . Alcohol use: Yes    Comment: occasional social drink/lwb  . Drug use: No  . Sexual activity: Not on file  Other Topics Concern  . Not on file  Social History Narrative  . Not on file   Social Determinants of Health   Financial Resource Strain: Not on file  Food Insecurity: Not on file  Transportation Needs: Not on file  Physical Activity: Not on file  Stress: Not on file  Social Connections: Not on file   Family History  Problem Relation Age of Onset  . Coronary artery disease Other        no family history of premature CAD   Allergies  Allergen Reactions  . Robaxin [Methocarbamol] Other (See Comments)    "made me crazy"  . Robaxin [Methocarbamol] Other (See Comments)    Mental confusion   Prior to Admission medications   Medication Sig Start Date End Date  Taking? Authorizing Provider  cloNIDine (CATAPRES - DOSED IN MG/24 HR) 0.1 mg/24hr patch Place 0.1 mg onto the skin once a week. TUESDAY   Yes [provider]  fluorometholone (FML) 0.1 % ophthalmic suspension Place 1 drop into both eyes 2 (two) times daily. 11/12/19  Yes [provider]  aspirin EC 81 MG tablet Take 81 mg by mouth daily. Patient not taking: Reported on 11/19/2019    [provider]  cholecalciferol (VITAMIN D) 1000 units tablet Take 5,000 Units by mouth daily. Patient not taking: Reported on 11/19/2019    [provider]  HYDROcodone-acetaminophen (NORCO/VICODIN) 5-325 MG tablet Take 1-2 tablets by mouth every 4 (four) hours as needed (mild pain). Patient not taking: No sig reported 10/19/15   Latanya Maudlin, MD  nystatin cream (MYCOSTATIN) Apply topically 2 (two) times daily. Patient not taking: No sig reported 12/28/19   [provider]  nystatin-triamcinolone (MYCOLOG II) cream Apply topically 2 (two) times daily. Patient not taking: No sig reported 01/29/20   [provider]  predniSONE (STERAPRED UNI-PAK 48 TAB) 10 MG (48) TBPK tablet TAKE AS DIRECTED PER PACKAGE INSTRUCTIONS. Patient not taking: No sig reported 10/17/18   Carole Civil, MD  rosuvastatin (CRESTOR) 20 MG tablet Take 20 mg by mouth daily.   Patient not taking: Reported on 11/19/2019    [provider]   DG Hip Unilat With Pelvis 2-3 Views Left  Result Date: 03/07/2020 CLINICAL DATA:  [place], left hip pain EXAM: DG HIP (WITH OR WITHOUT PELVIS) 2-3V LEFT COMPARISON:  02/01/2015 FINDINGS: Frontal view of the pelvis as well as frontal and cross-table lateral views of the left hip are obtained. There is a comminuted left femoral neck fracture. Fracture line involves the mid aspect of the left femoral neck, and extends through the greater trochanter. There is impaction and valgus angulation. No dislocation. Moderate left hip osteoarthritis. Right hip  hemiarthroplasty is unremarkable. The remainder of the bony pelvis is normal. IMPRESSION: 1. Comminuted left femoral neck fracture, extending from the mid left femoral neck through the greater trochanter. Significant impaction and varus angulation. Electronically Signed   By: Randa Ngo M.D.   On: 03/07/2020 20:00   Family History Reviewed and non-contributory, no pertinent history of problems with bleeding or anesthesia    Review of Systems No fevers or chills No numbness or tingling No chest pain No shortness of breath No bowel or bladder dysfunction No GI distress No headaches    OBJECTIVE  Vitals: Patient Vitals for the past 8 hrs:  BP Temp Temp src Pulse Resp SpO2 Height Weight  03/08/20 0123 (!) 156/67 98.2 F (36.8 C) Oral 82 18 97 % 6' 0.5" (1.842 m) 77.2 kg   General: Alert, no acute distress Cardiovascular: Extremities are warm Respiratory: No cyanosis, no use of accessory musculature Skin: No lesions in the area of chief complaint  Neurologic: Sensation intact  distally  Psychiatric: Patient is competent for consent with normal mood and affect Lymphatic: No swelling obvious and reported other than the area involved in the exam below Extremities  LLE: Extremity held in a fixed position.  ROM deferred due to known fracture.  Sensation is intact distally in the sural, saphenous, DP, SP, and plantar nerve distribution. 2+ DP pulse.  Toes are WWP.  Active motion intact in the TA/EHL/GS.  Actinic keratosis over anterior shin.  Mild swelling.  Scratch in lower leg.  RLE: Sensation is intact distally in the sural, saphenous, DP, SP, and plantar nerve distribution. 2+ DP pulse.  Toes are WWP.  Active motion intact in the TA/EHL/GS. Tolerates gentle ROM of the hip.  No pain with axial loading.     Test Results Imaging XR of the Right hip demonstrates a Displaced femoral neck fracture with extension into the greater trochanter.  The greater trochanter fragment is mildly  displaced.   Labs cbc Recent Labs    03/07/20 1959 03/08/20 0432  WBC 8.5 10.5  HGB 13.0 12.2*  HCT 39.9 36.4*  PLT 183 177     Labs coag Recent Labs    03/07/20 1959 03/08/20 0432  INR 1.1 1.2    Recent Labs    03/07/20 1959 03/08/20 0432  NA 142 140  K 4.0 4.1  CL 105 106  CO2 26 25  GLUCOSE 108* 116*  BUN 30* 27*  CREATININE 1.33* 1.13  CALCIUM 8.9 8.4*

## 2020-03-08 NOTE — Anesthesia Procedure Notes (Signed)
Procedure Name: Intubation Date/Time: 03/08/2020 12:24 PM Performed by: Orlie Dakin, CRNA Pre-anesthesia Checklist: Patient identified, Emergency Drugs available, Suction available and Patient being monitored Patient Re-evaluated:Patient Re-evaluated prior to induction Oxygen Delivery Method: Circle system utilized Preoxygenation: Pre-oxygenation with 100% oxygen Induction Type: IV induction Laryngoscope Size: Glidescope and 4 Grade View: Grade I Tube type: Oral Tube size: 7.5 mm Number of attempts: 1 Airway Equipment and Method: Stylet and Video-laryngoscopy Placement Confirmation: ETT inserted through vocal cords under direct vision,  positive ETCO2 and breath sounds checked- equal and bilateral Secured at: 23 cm Tube secured with: Tape Dental Injury: Teeth and Oropharynx as per pre-operative assessment  Comments: Glidescope used, as noted above, due to limited neck extension and H/O traumatic intubation reported by patient.  4x4s bite block used at end of case.

## 2020-03-08 NOTE — Anesthesia Preprocedure Evaluation (Addendum)
Anesthesia Evaluation  Patient identified by MRN, date of birth, ID band Patient awake    Reviewed: Allergy & Precautions, NPO status , Patient's Chart, lab work & pertinent test results, reviewed documented beta blocker date and time   History of Anesthesia Complications (+) DIFFICULT AIRWAY and history of anesthetic complications (as per patient, he had traumatic intubation in 2017 during laminectomy, swolen epiglottis)  Airway Mallampati: II  TM Distance: >3 FB Neck ROM: Full    Dental  (+) Dental Advisory Given, Teeth Intact   Pulmonary former smoker,    breath sounds clear to auscultation       Cardiovascular Exercise Tolerance: Good hypertension, Pt. on medications Normal cardiovascular exam Rhythm:Regular Rate:Normal     Neuro/Psych  Neuromuscular disease negative psych ROS   GI/Hepatic GERD  ,  Endo/Other    Renal/GU Renal InsufficiencyRenal disease     Musculoskeletal  (+) Arthritis  (left hip fx),   Abdominal   Peds  Hematology   Anesthesia Other Findings   Reproductive/Obstetrics                            Anesthesia Physical Anesthesia Plan  ASA: III  Anesthesia Plan: General/Spinal   Post-op Pain Management:    Induction:   PONV Risk Score and Plan: 3 and Ondansetron and Dexamethasone  Airway Management Planned: Natural Airway, Nasal Cannula and Simple Face Mask  Additional Equipment:   Intra-op Plan:   Post-operative Plan:   Informed Consent: I have reviewed the patients History and Physical, chart, labs and discussed the procedure including the risks, benefits and alternatives for the proposed anesthesia with the patient or authorized representative who has indicated his/her understanding and acceptance.     Dental advisory given  Plan Discussed with: CRNA and Surgeon  Anesthesia Plan Comments: (Possible GA with airway was discussed)         Anesthesia Quick Evaluation

## 2020-03-08 NOTE — Anesthesia Postprocedure Evaluation (Signed)
Anesthesia Post Note  Patient: Dominic Hodges  Procedure(s) Performed: ARTHROPLASTY BIPOLAR HIP (HEMIARTHROPLASTY) (Left Hip)  Patient location during evaluation: PACU Anesthesia Type: General Level of consciousness: awake and alert, oriented and sedated Pain management: pain level controlled Vital Signs Assessment: post-procedure vital signs reviewed and stable Respiratory status: spontaneous breathing and respiratory function stable Cardiovascular status: blood pressure returned to baseline and stable Postop Assessment: no apparent nausea or vomiting Anesthetic complications: no   No complications documented.   Last Vitals:  Vitals:   03/08/20 1630 03/08/20 1707  BP:  130/79  Pulse:  69  Resp:  14  Temp: 37.1 C 36.5 C  SpO2:  94%    Last Pain:  Vitals:   03/08/20 1707  TempSrc: Axillary  PainSc:                  Dominic Hodges

## 2020-03-08 NOTE — Transfer of Care (Signed)
Immediate Anesthesia Transfer of Care Note  Patient: Dominic Hodges  Procedure(s) Performed: ARTHROPLASTY BIPOLAR HIP (HEMIARTHROPLASTY) (Left Hip)  Patient Location: PACU  Anesthesia Type:General  Level of Consciousness: drowsy  Airway & Oxygen Therapy: Patient Spontanous Breathing and Patient connected to face mask oxygen  Post-op Assessment: Report given to RN and Post -op Vital signs reviewed and stable  Post vital signs: Reviewed and stable  Last Vitals:  Vitals Value Taken Time  BP 145/61 03/08/20 1624  Temp    Pulse 63 03/08/20 1627  Resp 18 03/08/20 1627  SpO2 100 % 03/08/20 1627  Vitals shown include unvalidated device data.  Last Pain:  Vitals:   03/08/20 1053  TempSrc: (P) Oral  PainSc: (P) 3       Patients Stated Pain Goal: (P) 4 (82/88/33 7445)  Complications: No complications documented.

## 2020-03-08 NOTE — Op Note (Signed)
Orthopaedic Surgery Operative Note (CSN: [id number])  Dominic Hodges  10-19-1927 Date of Surgery: 03/08/2020   Diagnoses:  Left femoral neck fracture, with extension of fracture through the base of the greater trochanter  Procedure: Left hip hemiarthroplasty with tension band construct for greater trochanter fragment   Operative Finding Successful completion of the planned procedure.  The size and stability of the greater trochanter fragment was unexpected.  Due to the unstable nature of the fracture, elected to secure the fragment with a tension band construct, and limit his activities postop.     Post-Op Diagnosis: Same Surgeons:Primary: Mordecai Rasmussen, MD Assistants: Marquita Palms Location: [facility] OR ROOM 4 Anesthesia: General with local anesthesia Antibiotics: Ancef 2 g with local vancomycin powder 1 g at the surgical site Tourniquet time: * No tourniquets in log * Estimated Blood Loss: 130 cc Complications: None Specimens: None Implants: Implant Name Type Inv. Item Serial No. Manufacturer Lot No. LRB No. Used Action  CEMENT HV SMART SET - [id number] Cement CEMENT HV SMART SET  DEPU[employer]TH[employer] [id number] Left 1 Implanted  HIP BALL ARTICU 28 +5 - [id number] Hips HIP BALL ARTICU 28 +5  DEPUY ORTHOPA[employer] [id number] Left 1 Implanted  CEMENT RESTRICTOR DEPUY SZ 3 - [id number] Cement CEMENT RESTRICTOR DEPUY SZ 3  DEPUY ORTHOPAEDICS [id number] Left 1 Implanted  CENTRALIZER STEM HIP 12MM - [id number] Hips CENTRALIZER STEM HIP 12MM  DEPUY ORTHOPAEDICS [id number] Left 1 Implanted  STEM SUMMIT CEMENT BASIC SZ7 - [id number] Hips STEM SUMMIT CEMENT BASIC SZ7  DEPUY ORTHOPAED[employer] [id number] Left 1 Implanted  CEMENT HV SMART SET - [id number] Cement CEMENT HV SMART SET  DEPUY ORTH[employer] [id number] Left 1 Implanted  1.60mm k wire     [id number] Left 2 Implanted  18g wire     STERILE FROM SPD Left 1 Implanted    Indications for Surgery:   Dominic Hodges is a 87 y.o. male with a displaced left femoral neck  fracture and extension into the greater trochanter.  Benefits and risks of operative and nonoperative management were discussed prior to surgery with patient/guardian(s) and informed consent form was completed.  Specific risks including infection, need for additional surgery, nonunion, malunion, bleeding, persistent pain, dislocation and more severe complications associated with anesthesia.  The patient elected to proceed.    Procedure:   The patient was identified properly. Informed consent was obtained and the surgical site was marked. The patient was taken up to suite where general anesthesia was induced.  The patient was positioned lateral, with the left side up.  The left leg was prepped and draped in the usual sterile fashion.  Timeout was performed before the beginning of the case.  Ancef dosing was confirmed prior to incision.  Patient received 1 g of TXA before the start of the case  We started by making an incision centered over the greater trochanter with a scalpel. We used the scalpel to continue to dissect to the fascia.  Electrocautery was used to help with hemostasis.  A cobb elevator was used to clear the IT band and then it was pierced with Bovie electrocautery. Mayo scissors were used to cut the fascia in a longitudinal fashion. The gluteus maximus fibers were bluntly split in line with their fibers.   The femur was slowly internally rotated, putting tension on the posterior structures. The short external rotators were identified and tagged with #1 vicryl.  Bovie electrocautery was used to dissect the short external rotators off of the insertion onto the femur.  Next, we identified the capsule and made a T capsulotomy.  The capsule was then tagged with #1 Vicryl sutures.   Next, we visualized the femoral neck fracture. We identified the sciatic nerve by palpation and verified that it was not in danger during the dissection.    We then made a neck cut approximately 15 mm above the lesser  trochanter with a saw, to match the contralateral hip. We removed the femoral head.  There was a fracture line extending through the base of the greater trochanter, which made the fragment unstable.  With internal rotation of the hip, the fragment would displaced further.  Once the hip was extended, the fragment would reduce.  At this point, I made the decision to proceed with the hemiarthroplasty.  We used the box cutter to cut away some of the greater trochanter for ease of insertion of the stem. We then used the canal finder to locate the femoral canal.   Using the angle of the femoral neck as our guide for version, we broached sequentially. We trialed components and found appropriate fit and stability.  The patient would come to full extension of the hip. The hip was stable at 90 degrees of flexion, and 45 degrees of internal rotation.  Leg lengths were approximately equal.  We then removed the trials as well as the broach. We ensured there were no foreign bodies or bone within the acetabulum. We copiously irrigated the wound.  We measured and placed an appropriately sized cement restrictor within the canal.  Cement was mixed on the back table and when ready, cement was inserted into the canal and pressurized.  We then carefully placed our stem.  Excess cement was removed immediately.  We maintained pressure on the stem while the cement hardened.  An appropriately sized head was placed on to the stem and the hip was reduced. Again, we noted it was stable in the previously mentioned manipulations.   Once we were satisfied with the hip hemiarthroplasty, we turned our attention to the greater trochanter fragment.  With the leg in extension, the fragment was minimally displaced, but I did not feel comfortable leaving the fragment and limiting his activities.  The fracture involved the entire greater trochanter, at about the level of the hemiarthroplasty.  With the fracture reduced, it was secured with 2 K wires.   We then made a drill hole and passed an 18 gauge wire through the bony bridge and completed a tension band with the K wires.  The wire was passed around the K wires and sequentially tightened.  The fracture remained reduced, and this was confirmed under fluoroscopy.  The K wires were cut, bent 180 degrees and tamped into the bone.   The hip was thoroughly irrigated and 1 g of Vancomycin powder was placed in the hip.  We repaired the posterior capsule.  Short external rotators repaired to the greater trochanter to bone tunnels. We closed the fascia of the iliotibial band and gluteus maximus with running and intterupted Vicryl sutures. We then closed Scarpa's fascia with Vicryl sutures. Skin was closed with 2-0 monocryl and 4-0 Monocryl with Steri-Strips and an Aquacel dressing was placed.  A hib abduction pillow was secured.  Patient was awoken taken to PACU in stable condition.   Post-operative plan:  The patient will be 50% PWB on the operative extremity. Hip abduction pillow in place at all times when in bed Posterior hip precautions; patient to limit hip abduction until follow up.  PT/OT  DVT prophylaxis Aspirin 81 mg twice daily for 6 weeks.    Pain control with PRN pain medication preferring oral medicines.   Follow up plan will be scheduled in approximately 14 days for incision check and XR.

## 2020-03-08 NOTE — TOC Initial Note (Signed)
Transition of Care [facility]) - Initial/Assessment Note    Patient Details  Name: Dominic Hodges MRN: [id number] Date of Birth: Oct 07, 1927  [facility] CM/SW Contact:    Salome Arnt, LCSW Phone Number: 03/08/2020, 11:02 AM  Clinical Narrative:  Pt admitted due to hip fracture. LCSW completed assessment with pt's daughter, [name] and also spoke with pt's wife regarding d/c plan as pt was already transported to OR. Pt lives with wife. He primarily ambulates with a walker. LCSW discussed d/c plan and wife and daughter agree pt will need SNF following surgery. Pt was at [facility] for several weeks after previous hip fracture and they request referral be sent to [facility]. LCSW will complete FL2 and send to [facility].                   Expected Discharge Plan: Skilled Nursing Facility Barriers to Discharge: Continued Medical Work up   Patient Goals and CMS Choice Patient states their goals for this hospitalization and ongoing recovery are:: TBD after surgery   Choice offered to / list presented to : Adult Children  Expected Discharge Plan and Services Expected Discharge Plan: [place] In-house Referral: Clinical Social Work   Post Acute Care Choice: [place] Living arrangements for the past 2 months: [place]                 DME Arranged: N/A                    Prior Living Arrangements/Services Living arrangements for the past 2 months: Single Family Home Lives with:: Spouse Patient language and need for interpreter reviewed:: Yes Do you feel safe going back to the place where you live?: Yes      Need for Family Participation in Patient Care: Yes (Comment)   Current home services: DME (walker, cane) Criminal Activity/Legal Involvement Pertinent to Current Situation/Hospitalization: No - Comment as needed  Activities of Daily Living Home Assistive Devices/Equipment: Cane (specify quad or straight),Walker (specify  type),Eyeglasses ADL Screening (condition at time of admission) Patient's cognitive ability adequate to safely complete daily activities?: Yes Is the patient deaf or have difficulty hearing?: Yes Does the patient have difficulty seeing, even when wearing glasses/contacts?: Yes Does the patient have difficulty concentrating, remembering, or making decisions?: No Patient able to express need for assistance with ADLs?: Yes Does the patient have difficulty dressing or bathing?: No Independently performs ADLs?: Yes (appropriate for developmental age) Does the patient have difficulty walking or climbing stairs?: Yes Weakness of Legs: Both Weakness of Arms/Hands: None  Permission Sought/Granted Permission sought to share information with : Customer service manager                Emotional Assessment         Alcohol / Substance Use: Not Applicable Psych Involvement: No (comment)  Admission diagnosis:  Closed left femoral fracture ([place]) [S72.92XA] Patient Active Problem List   Diagnosis Date Noted  . Closed left femoral fracture ([place]) 03/07/2020  . Accidental fall 03/07/2020  . CKD (chronic kidney disease), stage III ([place]) 03/07/2020  . Spinal stenosis, lumbar region, with neurogenic claudication 10/19/2015  . Closed right hip fracture ([place]) 12/05/2014  . Closed hip fracture ([place]) 12/05/2014  . Hypertension 12/05/2014  . BPH (benign prostatic hyperplasia) 12/05/2014  . Hyperlipidemia 12/05/2014  . Essential hypertension, benign 06/01/2010  . NUMBNESS/TINGLING 11/20/2006  . BACK PAIN 11/20/2006   PCP:  Celene Squibb, MD Pharmacy:  [facility] [place] [address] Phone: [phone] Fax: [phone]     Social Determinants of Health (SDOH) Interventions    Readmission Risk Interventions No flowsheet data found.

## 2020-03-08 NOTE — Progress Notes (Signed)
Patient Demographics:    Dominic Hodges, is a 87 y.o. male, DOB - 05-31-1927, KZL:[id number]  Admit date - 03/07/2020   Admitting Physician Bernadette Hoit, DO  Outpatient Primary MD for the patient is Celene Squibb, MD  LOS - 1   Chief Complaint  Patient presents with  . 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 oxycodone and IV morphine sulfate as needed, -Methocarbamol as ordered --Plan is for orthopedic/operative fixation on 03/08/2020 -Postop care and postop DVT prophylaxis as per orthopedic team -Await PT eval  2)Aki CKD 3A- -creatinine is down to 1.1 from 1.33 on admission after hydration  -renally adjust medications, avoid nephrotoxic agents / dehydration  / hypotension  3)HTN--- continue clonidine patch, IV labetalol as needed elevated BP  4) acute anemia--due to hip fracture with acute blood loss as well as some component of hemodilution -Hgb is currently 12.2 from a baseline usually between 13 and 14 -Watch H&H closely and transfuse as clinically indicated  5)HLD--- continue Crestor  Disposition/Need for in-Hospital Stay- patient unable to be discharged at this time due to left hip fracture  requiring pain control and operative fixation, given recurrent falls patient will most likely need SNF placement  Status is: Inpatient  Remains inpatient appropriate because:Please see above  Disposition: The patient is from: Home              Anticipated d/c is to: SNF              Anticipated d/c date is: 2 days              Patient currently is not medically stable to d/c. Barriers: Not Clinically Stable-   Code Status :  -  Code Status: Full Code   Family Communication:    (patient is alert, awake and coherent)  Discussed with wife   Consults  :  ortho  DVT Prophylaxis  :   - SCDs   SCDs Start: 03/07/20 2228    Lab Results  Component Value Date   PLT 177 03/08/2020    Inpatient Medications  Scheduled Meds: . [MAR Hold] cloNIDine  0.1 mg Transdermal Weekly  . cyclobenzaprine  5 mg Oral QHS  . [MAR Hold] mupirocin ointment  1 application Nasal BID  . [MAR Hold] rosuvastatin  20 mg Oral Daily   Continuous Infusions: PRN Meds:.0.9 % irrigation (POUR BTL), [MAR Hold] labetalol, [MAR Hold]  morphine injection, oxyCODONE    Anti-infectives (From admission, onward)   Start  Dose/Rate Route Frequency Ordered Stop   03/08/20 1112  ceFAZolin (ANCEF) 2-4 GM/100ML-% IVPB       Note to Pharmacy: Hinton Rao   : cabinet override      03/08/20 1112 03/08/20 1202   03/08/20 0845  [MAR Hold]  ceFAZolin (ANCEF) IVPB 2g/100 mL premix        (MAR Hold since Tue 03/08/2020 at 1038.Hold Reason: Transfer to a Procedural area.)   2 g 200 mL/hr over 30 Minutes Intravenous On call to O.R. 03/08/20 0757 03/08/20 1216        Objective:   Vitals:   03/07/20 2130 03/07/20 2225 03/08/20 0123 03/08/20 1053  BP: (!) 154/56 (!) 161/65 (!) 156/67 (!) (P) 143/54  Pulse: 86 79 82 (P) 70  Resp: 16 19 18  (P) 16  Temp:  98.4 F (36.9 C) 98.2 F (36.8 C) (P) 97.8 F (36.6 C)  TempSrc:   Oral (P) Oral  SpO2: 98% 96% 97% (P) 97%  Weight:   77.2 kg   Height:   6' 0.5" (1.842 m)     Wt  Readings from Last 3 Encounters:  03/08/20 77.2 kg  11/19/19 72.6 kg  06/29/17 77.1 kg    Intake/Output Summary (Last 24 hours) at 03/08/2020 1356 Last data filed at 03/08/2020 1317 Gross per 24 hour  Intake 1200 ml  Output 250 ml  Net 950 ml   Physical Exam  Gen:- Awake Alert, cooperative HEENT:- [place].AT, No sclera icterus Neck-Supple Neck,No JVD,.  Lungs-  CTAB , fair symmetrical air movement CV- S1, S2 normal, regular  Abd-  +ve B.Sounds, Abd Soft, No tenderness,    Extremity/Skin:- No  edema, pedal pulses present  Psych-affect is appropriate, oriented x3 Neuro-generalized weakness and unsteady gait, no new focal deficits, no tremors MSK-left hip shortened and rotated, point tenderness over the hip area   Data Review:   Micro Results Recent Results (from the past 240 hour(s))  Surgical PCR screen     Status: None   Collection Time: 03/08/20  1:08 AM   Specimen: Nasal Mucosa; Nasal Swab  Result Value Ref Range Status   MRSA, PCR NEGATIVE NEGATIVE Final   Staphylococcus aureus NEGATIVE NEGATIVE Final    Comment: (NOTE) The Xpert SA Assay (FDA approved for NASAL specimens in patients 18 years of age and older), is one component of a comprehensive surveillance program. It is not intended to diagnose infection nor to guide or monitor treatment. Performed at [facility], [address], [place], [place] [address]     Radiology Reports DG Hip Unilat With Pelvis 2-3 Views Left  Result Date: 03/07/2020 CLINICAL DATA:  [place], left hip pain EXAM: DG HIP (WITH OR WITHOUT PELVIS) 2-3V LEFT COMPARISON:  02/01/2015 FINDINGS: Frontal view of the pelvis as well as frontal and cross-table lateral views of the left hip are obtained. There is a comminuted left femoral neck fracture. Fracture line involves the mid aspect of the left femoral neck, and extends through the greater trochanter. There is impaction and valgus angulation. No dislocation. Moderate left hip osteoarthritis. Right hip  hemiarthroplasty is unremarkable. The remainder of the bony pelvis is normal. IMPRESSION: 1. Comminuted left femoral neck fracture, extending from the mid left femoral neck through the greater trochanter. Significant impaction and varus angulation. Electronically Signed   By: Randa Ngo M.D.   On: 03/07/2020 20:00     CBC Recent Labs  Lab 03/07/20 1959 03/08/20 0432  WBC 8.5 10.5  HGB 13.0 12.2*  HCT 39.9  36.4*  PLT 183 177  MCV 96.4 95.5  MCH 31.4 32.0  MCHC 32.6 33.5  RDW 12.6 12.5  LYMPHSABS 1.3  --   MONOABS 0.5  --   EOSABS 0.1  --   BASOSABS 0.0  --     Chemistries  Recent Labs  Lab 03/07/20 1959 03/08/20 0432  NA 142 140  K 4.0 4.1  CL 105 106  CO2 26 25  GLUCOSE 108* 116*  BUN 30* 27*  CREATININE 1.33* 1.13  CALCIUM 8.9 8.4*  MG  --  2.0  AST  --  19  ALT  --  14  ALKPHOS  --  48  BILITOT  --  1.1   ------------------------------------------------------------------------------------------------------------------ No results for input(s): CHOL, HDL, LDLCALC, TRIG, CHOLHDL, LDLDIRECT in the last 72 hours.  Lab Results  Component Value Date   HGBA1C 5.6 01/11/2017   ------------------------------------------------------------------------------------------------------------------ No results for input(s): TSH, T4TOTAL, T3FREE, THYROIDAB in the last 72 hours.  Invalid input(s): FREET3 ------------------------------------------------------------------------------------------------------------------ No results for input(s): VITAMINB12, FOLATE, FERRITIN, TIBC, IRON, RETICCTPCT in the last 72 hours.  Coagulation profile Recent Labs  Lab 03/07/20 1959 03/08/20 0432  INR 1.1 1.2    No results for input(s): DDIMER in the last 72 hours.  Cardiac Enzymes No results for input(s): CKMB, TROPONINI, MYOGLOBIN in the last 168 hours.  Invalid input(s):  CK ------------------------------------------------------------------------------------------------------------------ No results found for: BNP   Roxan Hockey M.D on 03/08/2020 at 1:56 PM  Go to [email or web address] - for contact info  [facility] - Office  [phone]

## 2020-03-08 NOTE — Progress Notes (Signed)
Received [name] consult from night nurse that patient would like prayer before surgery the following day. Arrived and found patient laying on his back with his wife and daughter bedside. Introduced self and was welcomed warmly bedside. 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 [name] was pleasant and engaged well today often drifting off to sleep due to pain medication administered an hour before visit this morning. Chaplain provided spiritual support and prayer bedside before surgery. Will remain available to provide spiritual support and to assess for spiritual need. Family and patient coping well at this time.

## 2020-03-09 DIAGNOSIS — S72032A Displaced midcervical fracture of left femur, initial encounter for closed fracture: Secondary | ICD-10-CM | POA: Diagnosis not present

## 2020-03-09 DIAGNOSIS — I10 Essential (primary) hypertension: Secondary | ICD-10-CM | POA: Diagnosis not present

## 2020-03-09 DIAGNOSIS — N183 Chronic kidney disease, stage 3 unspecified: Secondary | ICD-10-CM | POA: Diagnosis not present

## 2020-03-09 DIAGNOSIS — E782 Mixed hyperlipidemia: Secondary | ICD-10-CM | POA: Diagnosis not present

## 2020-03-09 LAB — CBC
HCT: 31.1 % — ABNORMAL LOW (ref 39.0–52.0)
Hemoglobin: 10.3 g/dL — ABNORMAL LOW (ref 13.0–17.0)
MCH: 32 pg (ref 26.0–34.0)
MCHC: 33.1 g/dL (ref 30.0–36.0)
MCV: 96.6 fL (ref 80.0–100.0)
Platelets: 151 10*3/uL (ref 150–400)
RBC: 3.22 MIL/uL — ABNORMAL LOW (ref 4.22–5.81)
RDW: 12.5 % (ref 11.5–15.5)
WBC: 12.9 10*3/uL — ABNORMAL HIGH (ref 4.0–10.5)
nRBC: 0 % (ref 0.0–0.2)

## 2020-03-09 LAB — BASIC METABOLIC PANEL
Anion gap: 8 (ref 5–15)
BUN: 29 mg/dL — ABNORMAL HIGH (ref 8–23)
CO2: 25 mmol/L (ref 22–32)
Calcium: 7.8 mg/dL — ABNORMAL LOW (ref 8.9–10.3)
Chloride: 105 mmol/L (ref 98–111)
Creatinine, Ser: 1.31 mg/dL — ABNORMAL HIGH (ref 0.61–1.24)
GFR, Estimated: 52 mL/min — ABNORMAL LOW (ref >60)
Glucose, Bld: 117 mg/dL — ABNORMAL HIGH (ref 70–99)
Potassium: 4.4 mmol/L (ref 3.5–5.1)
Sodium: 138 mmol/L (ref 135–145)

## 2020-03-09 LAB — GLUCOSE, CAPILLARY
Glucose-Capillary: 109 mg/dL — ABNORMAL HIGH (ref 70–99)
Glucose-Capillary: 110 mg/dL — ABNORMAL HIGH (ref 70–99)
Glucose-Capillary: 121 mg/dL — ABNORMAL HIGH (ref 70–99)

## 2020-03-09 MED ORDER — SODIUM CHLORIDE 0.9 % IV SOLN: INTRAVENOUS | Status: DC

## 2020-03-09 NOTE — Progress Notes (Signed)
   ORTHOPAEDIC PROGRESS NOTE  s/p Procedure(s): Left hip hemiarthroplasty and fixation of greater trochanter fracture with tension band  DOS: 03/08/20  SUBJECTIVE: No issues over night.  Pain is well controlled.  Asking to eat this morning.  OBJECTIVE: PE:  Resting comfortably.  No acute distress  Left hip dressing is clean, dry and intact Hip abduction pillow in place Sensation to left foot is intact Active dorsiflexion of the ankle and great toe Some swelling and discoloration over the shin distally  Vitals:   03/09/20 0601 03/09/20 0640  BP: (!) 125/102 (!) 117/48  Pulse: 84 78  Resp:    Temp: 97.7 F (36.5 C)   SpO2: 91%    XR left hip with cemented hemiarthroplasty in good position.  Hip is located.  Tension band construct with reduced greater trochanter fracture.  No acute injuries.  No hardware issues.   ASSESSMENT: Dominic Hodges Dominic Hodges is a 87 y.o. male doing well postoperatively.  PLAN: Weightbearing: PWB 50% LLE; no active hip abduction Insicional and dressing care: Dressings left intact until follow-up Orthopedic device(s): Hip abduction pillow while in bed VTE prophylaxis: Aspirin 81mg  BID 6 weeks Pain control: PRN pain medications Follow - up plan: 2 weeks   Contact information:     Mark A. Amedeo Kinsman, MD [facility] [place] [facility] [address] [place],  [place]  [address] Phone: [phone] Fax: [phone]

## 2020-03-09 NOTE — Progress Notes (Signed)
Patient has reported no pain throughout the night. Aquacel dressing is clean, dry and intact. Patient stated he was hungry this morning. Tried to give him some apple sauce and graham crackers but patient had a hard time eating because he just starts coughing.

## 2020-03-09 NOTE — Progress Notes (Signed)
Patient Demographics:    Dominic Hodges, is a 87 y.o. male, DOB - 24-Sep-1927, QHU:[id number]  Admit date - 03/07/2020   Admitting Physician Bernadette Hoit, DO  Outpatient Primary MD for the patient is Celene Squibb, MD  LOS - 2   Chief Complaint  Patient presents with  . Hip Pain        Subjective:    Dominic Hodges he was having some pain in his left hip which has improved.  Denies any shortness of breath.  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 oxycodone and IV morphine sulfate as needed, -Methocarbamol as ordered --Plan is for orthopedic/operative fixation on 03/08/2020 -Postop care and postop DVT prophylaxis as per orthopedic team -Seen by physical therapy with recommendation for skilled nurse facility placement  2)Aki CKD 3A- -creatinine is down to 1.1 from 1.33 on admission after hydration  -renally adjust medications, avoid nephrotoxic agents / dehydration  / hypotension  3)HTN--- continue clonidine patch, IV labetalol as needed elevated BP  4) acute anemia--due to hip fracture with acute blood loss as well as some component of hemodilution -Hgb is currently 10.3 from a baseline usually between 13 and 14 -Watch H&H closely and transfuse as clinically indicated  5)HLD--- continue Crestor  Disposition/Need for  in-Hospital Stay- patient unable to be discharged at this time due to left hip fracture requiring pain control and operative fixation, given recurrent falls patient will most likely need SNF placement  Status is: Inpatient  Remains inpatient appropriate because:Please see above  Disposition: The patient is from: Home              Anticipated d/c is to: SNF              Anticipated d/c date is: 1 day              Patient currently is not medically stable to d/c. Barriers: Not Clinically Stable-   Code Status :  -  Code Status: Full Code   Family Communication:    (patient is alert, awake and coherent)  No family at bedside today  Consults  :  ortho  DVT Prophylaxis  :   - SCDs   SCDs Start: 03/07/20 2228    Lab Results  Component Value Date   PLT 151 03/09/2020    Inpatient Medications  Scheduled Meds: . aspirin  81 mg Oral BID  . Chlorhexidine Gluconate Cloth  6 each Topical Daily  . cloNIDine  0.1 mg Transdermal Weekly  . cyclobenzaprine  5 mg Oral QHS  . mupirocin ointment  1 application Nasal BID  . rosuvastatin  20 mg Oral Daily   Continuous Infusions: . sodium chloride 75 mL/hr  at 03/09/20 1814   PRN Meds:.labetalol, morphine injection, oxyCODONE    Anti-infectives (From admission, onward)   Start     Dose/Rate Route Frequency Ordered Stop   03/08/20 1800  ceFAZolin (ANCEF) IVPB 2g/100 mL premix        2 g 200 mL/hr over 30 Minutes Intravenous Every 8 hours 03/08/20 1634 03/09/20 1451   03/08/20 1538  vancomycin (VANCOCIN) powder  Status:  Discontinued          As needed 03/08/20 1538 03/08/20 1634   03/08/20 1112  ceFAZolin (ANCEF) 2-4 GM/100ML-% IVPB       Note to Pharmacy: Hinton Rao   : cabinet override      03/08/20 1112 03/08/20 1804   03/08/20 0845  ceFAZolin (ANCEF) IVPB 2g/100 mL premix        2 g 200 mL/hr over 30 Minutes Intravenous On call to O.R. 03/08/20 0757 03/08/20 1216        Objective:   Vitals:   03/09/20 0640 03/09/20 1124  03/09/20 1419 03/09/20 2026  BP: (!) 117/48 (!) 100/51 (!) 109/40 (!) 110/47  Pulse: 78 87 83 81  Resp:    17  Temp:   99 F (37.2 C) 98.7 F (37.1 C)  TempSrc:   Oral Temporal  SpO2:   (!) 88% (!) 87%  Weight:      Height:        Wt Readings from Last 3 Encounters:  03/08/20 77.2 kg  11/19/19 72.6 kg  06/29/17 77.1 kg    Intake/Output Summary (Last 24 hours) at 03/09/2020 2115 Last data filed at 03/09/2020 1848 Gross per 24 hour  Intake 1719.56 ml  Output 225 ml  Net 1494.56 ml   Physical Exam  General exam: Alert, awake, oriented x 3 Respiratory system: Clear to auscultation. Respiratory effort normal. Cardiovascular system:RRR. No murmurs, rubs, gallops. Gastrointestinal system: Abdomen is nondistended, soft and nontender. No organomegaly or masses felt. Normal bowel sounds heard. Central nervous system: Alert and oriented. No focal neurological deficits. Extremities: No C/C/E, +pedal pulses Skin: No rashes, lesions or ulcers Psychiatry: Judgement and insight appear normal. Mood & affect appropriate.    Data Review:   Micro Results Recent Results (from the past 240 hour(s))  Surgical PCR screen     Status: None   Collection Time: 03/08/20  1:08 AM   Specimen: Nasal Mucosa; Nasal Swab  Result Value Ref Range Status   MRSA, PCR NEGATIVE NEGATIVE Final   Staphylococcus aureus NEGATIVE NEGATIVE Final    Comment: (NOTE) The Xpert SA Assay (FDA approved for NASAL specimens in patients 71 years of age and older), is one component of a comprehensive surveillance program. It is not intended to diagnose infection nor to guide or monitor treatment. Performed at [facility], [address] [place], [place] [address]     Radiology Reports DG Pelvis 1-2 Views  Result Date: 03/08/2020 CLINICAL DATA:  Status post total hip arthroplasty on the left. EXAM: PELVIS - 1-2 VIEW COMPARISON:  December 06, 2014 FINDINGS: There are postsurgical changes related to total hip  arthroplasty on the left. The hardware appears grossly intact. The alignment appears near anatomic. Percutaneous pins are noted through the greater trochanter. The patient is status post prior remote total hip arthroplasty on the right. There is diffuse osteopenia which limits evaluation. IMPRESSION: Status post total hip arthroplasty on the left. No evidence of hardware fracture or loosening. Electronically Signed   By: Constance Holster M.D.   On: 03/08/2020 19:33  DG C-Arm 1-60 Min-No Report  Result Date: 03/08/2020 Fluoroscopy was utilized by the requesting physician.  No radiographic interpretation.   DG Hip Unilat With Pelvis 2-3 Views Left  Result Date: 03/07/2020 CLINICAL DATA:  [place], left hip pain EXAM: DG HIP (WITH OR WITHOUT PELVIS) 2-3V LEFT COMPARISON:  02/01/2015 FINDINGS: Frontal view of the pelvis as well as frontal and cross-table lateral views of the left hip are obtained. There is a comminuted left femoral neck fracture. Fracture line involves the mid aspect of the left femoral neck, and extends through the greater trochanter. There is impaction and valgus angulation. No dislocation. Moderate left hip osteoarthritis. Right hip hemiarthroplasty is unremarkable. The remainder of the bony pelvis is normal. IMPRESSION: 1. Comminuted left femoral neck fracture, extending from the mid left femoral neck through the greater trochanter. Significant impaction and varus angulation. Electronically Signed   By: Randa Ngo M.D.   On: 03/07/2020 20:00     CBC Recent Labs  Lab 03/07/20 1959 03/08/20 0432 03/08/20 1957 03/09/20 0606  WBC 8.5 10.5 15.1* 12.9*  HGB 13.0 12.2* 11.1* 10.3*  HCT 39.9 36.4* 33.2* 31.1*  PLT 183 177 169 151  MCV 96.4 95.5 97.4 96.6  MCH 31.4 32.0 32.6 32.0  MCHC 32.6 33.5 33.4 33.1  RDW 12.6 12.5 12.7 12.5  LYMPHSABS 1.3  --   --   --   MONOABS 0.5  --   --   --   EOSABS 0.1  --   --   --   BASOSABS 0.0  --   --   --     Chemistries  Recent Labs   Lab 03/07/20 1959 03/08/20 0432 03/09/20 0606  NA 142 140 138  K 4.0 4.1 4.4  CL 105 106 105  CO2 26 25 25   GLUCOSE 108* 116* 117*  BUN 30* 27* 29*  CREATININE 1.33* 1.13 1.31*  CALCIUM 8.9 8.4* 7.8*  MG  --  2.0  --   AST  --  19  --   ALT  --  14  --   ALKPHOS  --  48  --   BILITOT  --  1.1  --    ------------------------------------------------------------------------------------------------------------------ No results for input(s): CHOL, HDL, LDLCALC, TRIG, CHOLHDL, LDLDIRECT in the last 72 hours.  Lab Results  Component Value Date   HGBA1C 5.6 01/11/2017   ------------------------------------------------------------------------------------------------------------------ No results for input(s): TSH, T4TOTAL, T3FREE, THYROIDAB in the last 72 hours.  Invalid input(s): FREET3 ------------------------------------------------------------------------------------------------------------------ No results for input(s): VITAMINB12, FOLATE, FERRITIN, TIBC, IRON, RETICCTPCT in the last 72 hours.  Coagulation profile Recent Labs  Lab 03/07/20 1959 03/08/20 0432  INR 1.1 1.2    No results for input(s): DDIMER in the last 72 hours.  Cardiac Enzymes No results for input(s): CKMB, TROPONINI, MYOGLOBIN in the last 168 hours.  Invalid input(s): CK ------------------------------------------------------------------------------------------------------------------ No results found for: BNP   Kathie Dike M.D on 03/09/2020 at 9:15 PM  Go to [email or web address] - for contact info  [facility] - Office  [phone]

## 2020-03-09 NOTE — TOC Progression Note (Signed)
Transition of Care [facility]) - Progression Note    Patient Details  Name: Dominic Hodges MRN: [id number] Date of Birth: [birth year]  Transition of Care [facility]) CM/SW Contact  Natasha Bence, LCSW Phone Number: 03/09/2020, 3:10 PM  Clinical Narrative:    CSW notified family of [place] acceptance of referral. TOC to follow.   Expected Discharge Plan: [place] Barriers to Discharge: Continued Medical Work up  Expected Discharge Plan and Services Expected Discharge Plan: [place] In-house Referral: Clinical Social Work   Post Acute Care Choice: [place] Living arrangements for the past 2 months: Single Family Home                 DME Arranged: N/A                     Social Determinants of Health (SDOH) Interventions    Readmission Risk Interventions No flowsheet data found.

## 2020-03-09 NOTE — Plan of Care (Signed)
  Problem: Acute Rehab PT Goals(only PT should resolve) Goal: Pt Will Go Supine/Side To Sit Outcome: Progressing Flowsheets (Taken 03/09/2020 1158) Pt will go Supine/Side to Sit: with moderate assist Goal: Patient Will Transfer Sit To/From Stand Outcome: Progressing Flowsheets (Taken 03/09/2020 1158) Patient will transfer sit to/from stand: with moderate assist Goal: Pt Will Transfer Bed To Chair/Chair To Bed Outcome: Progressing Flowsheets (Taken 03/09/2020 1158) Pt will Transfer Bed to Chair/Chair to Bed: with mod assist Goal: Pt Will Ambulate Outcome: Progressing Flowsheets (Taken 03/09/2020 1158) Pt will Ambulate:  10 feet  with moderate assist  with rolling walker   11:58 AM, 03/09/20 Lonell Grandchild, MPT Physical Therapist with [facility] [phone] office [phone] mobile phone

## 2020-03-09 NOTE — Progress Notes (Signed)
Patient has no output at this time, Dr Roderic Palau notified. Orders received and given ,will continue to monitor patient.

## 2020-03-09 NOTE — Evaluation (Signed)
Physical Therapy Evaluation Patient Details Name: Dominic Hodges MRN: [id number] DOB: 1927/11/18 Today's Date: 03/09/2020   History of Present Illness  Dominic Hodges is a 87 y.o. male s/p  Left hip hemiarthroplasty with tension band construct for greater trochanter fragment on 03/08/20 with medical history significant for Hypertension, HLD who presents to the ED accompanied by wife due to left hip pain.  Patient lost his balance while ambulating with a walker and accidentally sustained a fall landing on his left hip with a subsequent severe sharp pain at the hip.  He denies hitting his head or losing consciousness.  Patient denies chest pain or shortness of breath.  He was well prior to the fall.  EMS was activated and patient was sent to the ED for further evaluation and management.    Clinical Impression  Patient medicated for pain prior to therapy and demonstrates slow labored movement for sitting up at bedside requiring frequent verbal/tactile cueing for proper use of BLE for supine to sitting with fair carryover, c/o severe pain with movement to LLE, occasional falling backwards upon sitting up at bedside and limited to a couple of shuffling steps due to poor standing balance and increased pain LLE when attempting to walk.  Patient tolerated sitting up in chair after therapy - RN notified.  Patient will benefit from continued physical therapy in hospital and recommended venue below to increase strength, balance, endurance for safe ADLs and gait.     Follow Up Recommendations SNF    Equipment Recommendations  None recommended by PT    Recommendations for Other Services       Precautions / Restrictions Precautions Precautions: Fall;Posterior Hip Precaution Booklet Issued: No Precaution Comments: The patient will be 50% PWB on the operative extremity.  Hip abduction pillow in place at all times when in bed  Posterior hip precautions; patient to limit hip abduction until follow  up. Restrictions Weight Bearing Restrictions: Yes LLE Weight Bearing: Partial weight bearing LLE Partial Weight Bearing Percentage or Pounds: 50% PWB LLE      Mobility  Bed Mobility Overal bed mobility: Needs Assistance Bed Mobility: Supine to Sit     Supine to sit: Max assist     General bed mobility comments: slow labored movement with c/o increased pain left hip    Transfers Overall transfer level: Needs assistance Equipment used: Rolling walker (2 wheeled) Transfers: Sit to/from [facility] Sit to Stand: Max assist Stand pivot transfers: Max assist       General transfer comment: poor tolerance for weightbearing on LLE  Ambulation/Gait Ambulation/Gait assistance: Max Web designer (Feet): 2 Feet Assistive device: Rolling walker (2 wheeled) Gait Pattern/deviations: Decreased step length - right;Decreased step length - left;Decreased stance time - left;Decreased stride length;Shuffle Gait velocity: slow   General Gait Details: limited to a couple of steps due to weakness, increased left hip pain, required max tactile assistance to slide feet during transfer to chair  Stairs            Wheelchair Mobility    Modified Rankin (Stroke Patients Only)       Balance Overall balance assessment: Needs assistance Sitting-balance support: Feet supported;No upper extremity supported Sitting balance-Leahy Scale: Poor Sitting balance - Comments: fair/poor seated at EOB   Standing balance support: During functional activity;Bilateral upper extremity supported Standing balance-Leahy Scale: Poor Standing balance comment: using RW  Pertinent Vitals/Pain Pain Assessment: Faces Faces Pain Scale: Hurts even more Pain Location: left hip with movement Pain Descriptors / Indicators: Sore;Grimacing;Guarding Pain Intervention(s): Limited activity within patient's tolerance;Monitored during session;Premedicated  before session;Repositioned    Home Living Family/patient expects to be discharged to:: Private residence Living Arrangements: Spouse/significant other Available Help at Discharge: Family;Available 24 hours/day Type of Home: House Home Access: Stairs to enter Entrance Stairs-Rails: Right;Left (to wide to reach both) Entrance Stairs-Number of Steps: 2 Home Layout: Two level;Able to live on main level with bedroom/bathroom;Full bath on main level Home Equipment: Walker - 2 wheels;Wheelchair - manual;Shower seat;Bedside commode Additional Comments: 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 / Transfers Assistance Needed: household ambulator using RW  ADL's / Homemaking Assistance Needed: assisted by family        Hand Dominance        Extremity/Trunk Assessment   Upper Extremity Assessment Upper Extremity Assessment: Defer to OT evaluation    Lower Extremity Assessment Lower Extremity Assessment: Generalized weakness;LLE deficits/detail LLE Deficits / Details: grossly 3-/5 LLE: Unable to fully assess due to pain LLE Sensation: WNL LLE Coordination: WNL    Cervical / Trunk Assessment Cervical / Trunk Assessment: Normal  Communication   Communication: No difficulties  Cognition Arousal/Alertness: Awake/alert Behavior During Therapy: WFL for tasks assessed/performed Overall Cognitive Status: Within Functional Limits for tasks assessed                                        General Comments      Exercises     Assessment/Plan    PT Assessment Patient needs continued PT services  PT Problem List Decreased strength;Decreased activity tolerance;Decreased balance;Decreased mobility;Pain       PT Treatment Interventions DME instruction;Stair training;Functional mobility training;Gait training;Therapeutic activities;Therapeutic exercise;Patient/family education;Balance training    PT Goals  (Current goals can be found in the Care Plan section)  Acute Rehab PT Goals Patient Stated Goal: return home after rehab PT Goal Formulation: With patient Time For Goal Achievement: 03/23/20 Potential to Achieve Goals: Good    Frequency Min 4X/week   Barriers to discharge        Co-evaluation PT/OT/SLP Co-Evaluation/Treatment: Yes Reason for Co-Treatment: Complexity of the patient's impairments (multi-system involvement);For patient/therapist safety PT goals addressed during session: Mobility/safety with mobility;Proper use of DME;Balance         AM-PAC PT "6 Clicks" Mobility  Outcome Measure Help needed turning from your back to your side while in a flat bed without using bedrails?: A Lot Help needed moving from lying on your back to sitting on the side of a flat bed without using bedrails?: A Lot Help needed moving to and from a bed to a chair (including a wheelchair)?: A Lot Help needed standing up from a chair using your arms (e.g., wheelchair or bedside chair)?: A Lot Help needed to walk in hospital room?: Total Help needed climbing 3-5 steps with a railing? : Total 6 Click Score: 10    End of Session   Activity Tolerance: Patient tolerated treatment well;Patient limited by fatigue Patient left: in chair;with call bell/phone within reach Nurse Communication: Mobility status;Weight bearing status;Precautions PT Visit Diagnosis: Unsteadiness on feet (R26.81);Other abnormalities of gait and mobility (R26.89);Muscle weakness (generalized) (M62.81)    Time: 3570-1779 PT Time Calculation (min) (ACUTE ONLY): 30 min   Charges:   PT  Evaluation $PT Eval Moderate Complexity: 1 Mod PT Treatments $Therapeutic Activity: 23-37 mins        11:56 AM, 03/09/20 Lonell Grandchild, MPT Physical Therapist with [facility] [phone] office [phone] mobile phone

## 2020-03-09 NOTE — Progress Notes (Signed)
Made follow-up visit today for [name]. He was found sitting in his recliner eating lunch. He smiled when he saw Chaplain and remembered this Probation officer from the day before. We talked about his surgery and how grateful he was that he is on the other side of it. Chaplain provided spiritual support and prayer bedside and will continue to remain available as needed or requested in order to provide spiritual support and to assess for spiritual need.

## 2020-03-09 NOTE — Plan of Care (Signed)
  Problem: Acute Rehab OT Goals (only OT should resolve) Goal: Pt. Will Perform Upper Body Dressing Flowsheets (Taken 03/09/2020 1314) Pt Will Perform Upper Body Dressing:  with modified independence  sitting Goal: Pt. Will Perform Lower Body Dressing Flowsheets (Taken 03/09/2020 1314) Pt Will Perform Lower Body Dressing:  with mod assist  with adaptive equipment  sitting/lateral leans Goal: Pt. Will Transfer To Toilet Flowsheets (Taken 03/09/2020 1314) Pt Will Transfer to Toilet:  with min assist  stand pivot transfer   Kerri Asche OT, MOT

## 2020-03-09 NOTE — Evaluation (Signed)
Occupational Therapy Evaluation Patient Details Name: Dominic Hodges MRN: [id number] DOB: 1927-03-17 Today's Date: 03/09/2020    History of Present Illness Dominic Hodges is a 87 y.o. male s/p  Left hip hemiarthroplasty with tension band construct for greater trochanter fragment on 03/08/20 with medical history significant for Hypertension, HLD who presents to the ED accompanied by wife due to left hip pain.  Patient lost his balance while ambulating with a walker and accidentally sustained a fall landing on his left hip with a subsequent severe sharp pain at the hip.  He denies hitting his head or losing consciousness.  Patient denies chest pain or shortness of breath.  He was well prior to the fall.  EMS was activated and patient was sent to the ED for further evaluation and management.   Clinical Impression   Pt agreeable to OT evaluation. Pt open to rehab prior to d/c harm. Pt under L LE PWB 50% precaution. Pt required maximal assist for stand pivot form bed to w/c with RW. Maximal assist for supine to sit bed mobility. Pt able to open deodorant container independently. Pt would benefit from continued acute OT services to improve functional strength, endurance, and ROM to increase ADL levels and overall independence.     Follow Up Recommendations  SNF    Equipment Recommendations  None recommended by OT           Precautions / Restrictions Precautions Precautions: Fall;Posterior Hip Precaution Booklet Issued: No Precaution Comments: The patient will be 50% PWB on the operative extremity.  Hip abduction pillow in place at all times when in bed  Posterior hip precautions; patient to limit hip abduction until follow up. Restrictions Weight Bearing Restrictions: Yes LLE Weight Bearing: Partial weight bearing LLE Partial Weight Bearing Percentage or Pounds: 50% PWB LLE      Mobility Bed Mobility Overal bed mobility: Needs Assistance Bed Mobility: Supine to Sit     Supine  to sit: Max assist     General bed mobility comments: slow labored movement with c/o increased pain left hip    Transfers Overall transfer level: Needs assistance Equipment used: Rolling walker (2 wheeled) Transfers: Sit to/from [facility] Sit to Stand: Max assist Stand pivot transfers: Max assist       General transfer comment: poor tolerance for weightbearing on LLE    Balance Overall balance assessment: Needs assistance Sitting-balance support: Feet supported;No upper extremity supported Sitting balance-Leahy Scale: Poor Sitting balance - Comments: fair/poor seated at EOB   Standing balance support: During functional activity;Bilateral upper extremity supported Standing balance-Leahy Scale: Poor Standing balance comment: using RW                           ADL either performed or assessed with clinical judgement   ADL Overall ADL's : Needs assistance/impaired                         Toilet Transfer: Maximal assistance;RW;Stand-pivot Toilet Transfer Details (indicate cue type and reason): Simulated via stand pivot to chair from EOB with Max assist using RW.           General ADL Comments: Able to independently open deodorant bottle.     Vision Baseline Vision/History: No visual deficits                  Pertinent Vitals/Pain Pain Assessment: Faces Faces Pain Scale: Hurts even more Pain  Location: left hip with movement Pain Descriptors / Indicators: Sore;Grimacing;Guarding Pain Intervention(s): Limited activity within patient's tolerance     Hand Dominance Right   Extremity/Trunk Assessment Upper Extremity Assessment Upper Extremity Assessment: Generalized weakness   Lower Extremity Assessment Lower Extremity Assessment: Defer to PT evaluation LLE Deficits / Details: grossly 3-/5 LLE: Unable to fully assess due to pain LLE Sensation: WNL LLE Coordination: WNL   Cervical / Trunk Assessment Cervical / Trunk  Assessment: Normal   Communication Communication Communication: No difficulties   Cognition Arousal/Alertness: Awake/alert Behavior During Therapy: WFL for tasks assessed/performed Overall Cognitive Status: Within Functional Limits for tasks assessed                                                      Home Living Family/patient expects to be discharged to:: Private residence Living Arrangements: Spouse/significant other Available Help at Discharge: Family;Available 24 hours/day Type of Home: House Home Access: Stairs to enter [employer] of Steps: 2 Entrance Stairs-Rails: Right;Left Home Layout: Two level;Able to live on main level with bedroom/bathroom;Full bath on main level Alternate Level Stairs-Number of Steps: 10-12 Alternate Level Stairs-Rails: Left Bathroom Shower/Tub: Tub/shower unit   Bathroom Toilet: Standard Bathroom Accessibility: Yes   Home Equipment: Walker - 2 wheels;Wheelchair - manual;Shower seat;Bedside commode   Additional Comments: Patient is a retired Engineer, drilling      Prior Functioning/Environment Level of Independence: Needs Product/process development scientist / Transfers Assistance Needed: household ambulator using RW ADL's / Homemaking Assistance Needed: Pt reported that he was Independent for ADLs prior to admission.            OT Problem List: Decreased strength;Decreased range of motion;Impaired balance (sitting and/or standing)      OT Treatment/Interventions: Self-care/ADL training;Therapeutic exercise;Therapeutic activities;Patient/family education;Balance training    OT Goals(Current goals can be found in the care plan section) Acute Rehab OT Goals Patient Stated Goal: return home after rehab OT Goal Formulation: With patient Time For Goal Achievement: 03/23/20 Potential to Achieve Goals: Good  OT Frequency: Min 2X/week               Co-evaluation PT/OT/SLP Co-Evaluation/Treatment: Yes Reason for Co-Treatment:  To address functional/ADL transfers PT goals addressed during session: Mobility/safety with mobility;Proper use of DME;Balance OT goals addressed during session: Strengthening/ROM                       End of Session Equipment Utilized During Treatment: Rolling walker  Activity Tolerance: Patient tolerated treatment well Patient left: in chair;with call bell/phone within reach  OT Visit Diagnosis: Unsteadiness on feet (R26.81);History of falling (Z91.81)                Time: 0174-9449 OT Time Calculation (min): 17 min Charges:  OT General Charges $OT Visit: 1 Visit OT Evaluation $OT Eval Moderate Complexity: 1 Mod  Nyshaun Standage OT, MOT   Larey Seat 03/09/2020, 1:10 PM

## 2020-03-10 ENCOUNTER — Inpatient Hospital Stay (HOSPITAL_COMMUNITY): Payer: Medicare Other

## 2020-03-10 ENCOUNTER — Encounter (HOSPITAL_COMMUNITY): Payer: Self-pay | Admitting: Orthopedic Surgery

## 2020-03-10 DIAGNOSIS — N183 Chronic kidney disease, stage 3 unspecified: Secondary | ICD-10-CM | POA: Diagnosis not present

## 2020-03-10 DIAGNOSIS — S72032A Displaced midcervical fracture of left femur, initial encounter for closed fracture: Secondary | ICD-10-CM | POA: Diagnosis not present

## 2020-03-10 DIAGNOSIS — I10 Essential (primary) hypertension: Secondary | ICD-10-CM | POA: Diagnosis not present

## 2020-03-10 DIAGNOSIS — E782 Mixed hyperlipidemia: Secondary | ICD-10-CM | POA: Diagnosis not present

## 2020-03-10 LAB — CBC
HCT: 27.2 % — ABNORMAL LOW (ref 39.0–52.0)
Hemoglobin: 8.9 g/dL — ABNORMAL LOW (ref 13.0–17.0)
MCH: 31.8 pg (ref 26.0–34.0)
MCHC: 32.7 g/dL (ref 30.0–36.0)
MCV: 97.1 fL (ref 80.0–100.0)
Platelets: 129 10*3/uL — ABNORMAL LOW (ref 150–400)
RBC: 2.8 MIL/uL — ABNORMAL LOW (ref 4.22–5.81)
RDW: 12.6 % (ref 11.5–15.5)
WBC: 10 10*3/uL (ref 4.0–10.5)
nRBC: 0 % (ref 0.0–0.2)

## 2020-03-10 LAB — GLUCOSE, CAPILLARY
Glucose-Capillary: 97 mg/dL (ref 70–99)
Glucose-Capillary: 142 mg/dL — ABNORMAL HIGH (ref 70–99)
Glucose-Capillary: 108 mg/dL — ABNORMAL HIGH (ref 70–99)

## 2020-03-10 LAB — BASIC METABOLIC PANEL
Anion gap: 9 (ref 5–15)
BUN: 40 mg/dL — ABNORMAL HIGH (ref 8–23)
CO2: 24 mmol/L (ref 22–32)
Calcium: 7.6 mg/dL — ABNORMAL LOW (ref 8.9–10.3)
Chloride: 102 mmol/L (ref 98–111)
Creatinine, Ser: 2 mg/dL — ABNORMAL HIGH (ref 0.61–1.24)
GFR, Estimated: 31 mL/min — ABNORMAL LOW (ref >60)
Glucose, Bld: 101 mg/dL — ABNORMAL HIGH (ref 70–99)
Potassium: 4.1 mmol/L (ref 3.5–5.1)
Sodium: 135 mmol/L (ref 135–145)

## 2020-03-10 MED ORDER — FUROSEMIDE 10 MG/ML IJ SOLN
40.0000 mg | Freq: Once | INTRAMUSCULAR | Status: AC
  Administered 2020-03-10: 40 mg via INTRAVENOUS
  Filled 2020-03-10: qty 4

## 2020-03-10 MED ORDER — POLYETHYLENE GLYCOL 3350 17 G PO PACK
17.0000 g | PACK | Freq: Every day | ORAL | Status: DC
  Administered 2020-03-10 – 2020-03-11 (×2): 17 g via ORAL
  Filled 2020-03-10 (×2): qty 1

## 2020-03-10 MED ORDER — BISACODYL 10 MG RE SUPP
10.0000 mg | Freq: Once | RECTAL | Status: AC
  Administered 2020-03-10: 10 mg via RECTAL
  Filled 2020-03-10: qty 1

## 2020-03-10 MED ORDER — LOPERAMIDE HCL 2 MG PO CAPS: 2.0000 mg | ORAL_CAPSULE | Freq: Once | ORAL | Status: DC

## 2020-03-10 NOTE — Plan of Care (Signed)

## 2020-03-10 NOTE — Progress Notes (Signed)
Physical Therapy Treatment Patient Details Name: Dominic Hodges MRN: [id number] DOB: 1927-05-18 Today's Date: 03/10/2020    History of Present Illness Dominic Hodges is a 87 y.o. male s/p  Left hip hemiarthroplasty with tension band construct for greater trochanter fragment on 03/08/20 with medical history significant for Hypertension, HLD who presents to the ED accompanied by wife due to left hip pain.  Patient lost his balance while ambulating with a walker and accidentally sustained a fall landing on his left hip with a subsequent severe sharp pain at the hip.  He denies hitting his head or losing consciousness.  Patient denies chest pain or shortness of breath.  He was well prior to the fall.  EMS was activated and patient was sent to the ED for further evaluation and management.    PT Comments    Patient received pain medication prior to therapy.  Patient demonstrates slow labored movement for sitting up at bedside requiring Max assist to move LLE and coming from supine to sitting up at bedside, slightly increased RLE strength for completing sit to stands, very unsteady on feet with poor tolerance and balance due to weakness and severe left hip pain.  Patient tolerated sitting up in chair after therapy - RN aware.  Patient will benefit from continued physical therapy in hospital and recommended venue below to increase strength, balance, endurance for safe ADLs and gait.     Follow Up Recommendations  SNF     Equipment Recommendations  None recommended by PT    Recommendations for Other Services       Precautions / Restrictions Precautions Precautions: Fall;Posterior Hip Precaution Booklet Issued: No Precaution Comments: The patient will be 50% PWB on the operative extremity.  Hip abduction pillow in place at all times when in bed  Posterior hip precautions; patient to limit hip abduction until follow up. Restrictions Weight Bearing Restrictions: Yes RLE Weight Bearing: Non  weight bearing LLE Weight Bearing: Partial weight bearing LLE Partial Weight Bearing Percentage or Pounds: 50% PWB LLE    Mobility  Bed Mobility Overal bed mobility: Needs Assistance Bed Mobility: Supine to Sit     Supine to sit: Max assist     General bed mobility comments: slow labored movement with c/o increased pain left hip    Transfers Overall transfer level: Needs assistance Equipment used: Rolling walker (2 wheeled) Transfers: Sit to/from [facility] Sit to Stand: Max assist Stand pivot transfers: Max assist       General transfer comment: slightly increased stregnth RLE for completing sit to stands  Ambulation/Gait Ambulation/Gait assistance: Max Web designer (Feet): 3 Feet Assistive device: Rolling walker (2 wheeled) Gait Pattern/deviations: Decreased step length - right;Decreased step length - left;Decreased stance time - left;Decreased stride length;Shuffle Gait velocity: slow   General Gait Details: demonstrates slightly improvment supporting body weight using RLE and BUE, has to shuffle left foot during transfer to chair due to weakness   Stairs             Wheelchair Mobility    Modified Rankin (Stroke Patients Only)       Balance Overall balance assessment: Needs assistance Sitting-balance support: Feet supported;No upper extremity supported Sitting balance-Leahy Scale: Fair Sitting balance - Comments: fair/good seated at EOB   Standing balance support: During functional activity;Bilateral upper extremity supported Standing balance-Leahy Scale: Poor Standing balance comment: using RW with 50% PWB on LLE  Cognition Arousal/Alertness: Awake/alert Behavior During Therapy: WFL for tasks assessed/performed Overall Cognitive Status: Within Functional Limits for tasks assessed                                        Exercises Total Joint Exercises Ankle  Circles/Pumps: Supine;Both;Strengthening;AROM;15 reps Quad Sets: Supine;10 reps;Left;Strengthening;AROM Heel Slides: Supine;15 reps;Left;Strengthening;AAROM    General Comments        Pertinent Vitals/Pain Pain Assessment: Faces Faces Pain Scale: Hurts even more Pain Location: left hip with movement Pain Descriptors / Indicators: Sore;Grimacing;Guarding Pain Intervention(s): Limited activity within patient's tolerance;Monitored during session;Premedicated before session;Repositioned    Home Living                      Prior Function            PT Goals (current goals can now be found in the care plan section) Acute Rehab PT Goals Patient Stated Goal: return home after rehab PT Goal Formulation: With patient Time For Goal Achievement: 03/23/20 Potential to Achieve Goals: Good Progress towards PT goals: Progressing toward goals    Frequency    Min 4X/week      PT Plan Current plan remains appropriate    Co-evaluation              AM-PAC PT "6 Clicks" Mobility   Outcome Measure  Help needed turning from your back to your side while in a flat bed without using bedrails?: A Lot Help needed moving from lying on your back to sitting on the side of a flat bed without using bedrails?: A Lot Help needed moving to and from a bed to a chair (including a wheelchair)?: A Lot Help needed standing up from a chair using your arms (e.g., wheelchair or bedside chair)?: A Lot Help needed to walk in hospital room?: Total Help needed climbing 3-5 steps with a railing? : Total 6 Click Score: 10    End of Session   Activity Tolerance: Patient tolerated treatment well;Patient limited by fatigue;Patient limited by pain Patient left: in chair;with call bell/phone within reach Nurse Communication: Mobility status PT Visit Diagnosis: Unsteadiness on feet (R26.81);Other abnormalities of gait and mobility (R26.89);Muscle weakness (generalized) (M62.81)     Time:  1050-1120 PT Time Calculation (min) (ACUTE ONLY): 30 min  Charges:  $Therapeutic Exercise: 8-22 mins $Therapeutic Activity: 8-22 mins                     12:30 PM, 03/10/20 Dominic Hodges, MPT Physical Therapist with [facility] [phone] office [phone] mobile phone

## 2020-03-10 NOTE — Progress Notes (Signed)
Patient Demographics:    Dominic Hodges, is a 87 y.o. male, DOB - 05/29/1927, OZD:[id number]  Admit date - 03/07/2020   Admitting Physician Bernadette Hoit, DO  Outpatient Primary MD for the patient is Celene Squibb, MD  LOS - 3   Chief Complaint  Patient presents with  . Hip Pain        Subjective:    Dominic Hodges overall he feels well.  Reports that pain is reasonably controlled.  Denies any shortness of breath.  Looking forward to going to rehab.  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 oxycodone and IV morphine sulfate as needed, -Methocarbamol as ordered -Patient underwent operative management on 3/1 -Postop care and postop DVT prophylaxis as per orthopedic team -Seen by physical therapy with recommendation for skilled nurse facility placement  2)Aki CKD 3A- -creatinine initially trended down to 1.1 from 1.33 on admission after hydration  -renally adjust medications, avoid nephrotoxic agents / dehydration  / hypotension -creatinine now trending up to 2.0 today -Renal ultrasound unrevealing -check urinalysis -continue IV hydration -he is not on any nephrotoxic agents -if creatinine continues to rise, may need nephrology input.  3)HTN--- continue clonidine patch, IV labetalol as needed elevated BP  4) acute anemia--due  to hip fracture with acute blood loss as well as some component of hemodilution -Hgb is currently 10.3 from a baseline usually between 13 and 14 -Watch H&H closely and transfuse as clinically indicated  5)HLD--- continue Crestor  Disposition/Need for in-Hospital Stay- patient unable to be discharged at this time due to left hip fracture requiring pain control and operative fixation, given recurrent falls patient will most likely need SNF placement  Status is: Inpatient  Remains inpatient appropriate because:Please see above  Disposition: The patient is from: Home              Anticipated d/c is to: SNF              Anticipated d/c date is: 1 day              Patient currently is not medically stable to d/c. Barriers: Not Clinically Stable-   Code Status :  -  Code Status: Full Code   Family Communication:    (patient is alert, awake and coherent)  No family at bedside today  Consults  :  ortho  DVT Prophylaxis  :   - SCDs   SCDs Start: 03/07/20 2228    Lab Results  Component Value Date   PLT 129 (L) 03/10/2020    Inpatient Medications  Scheduled Meds: . aspirin  81 mg Oral BID  . Chlorhexidine Gluconate Cloth  6 each Topical Daily  . cloNIDine  0.1 mg Transdermal Weekly  . cyclobenzaprine  5 mg Oral QHS  . mupirocin ointment  1 application Nasal BID  . polyethylene glycol  17 g Oral Daily  . rosuvastatin  20 mg Oral Daily   Continuous Infusions: . sodium chloride 75 mL/hr at 03/09/20 1814   PRN Meds:.labetalol, morphine injection, oxyCODONE    Anti-infectives (From admission, onward)   Start     Dose/Rate Route Frequency Ordered Stop   03/08/20 1800  ceFAZolin (ANCEF) IVPB 2g/100 mL premix        2 g 200 mL/hr over 30 Minutes Intravenous Every 8 hours 03/08/20 1634 03/09/20 1451   03/08/20 1538  vancomycin (VANCOCIN) powder  Status:  Discontinued          As needed 03/08/20 1538 03/08/20 1634   03/08/20 1112  ceFAZolin (ANCEF) 2-4 GM/100ML-% IVPB       Note  to Pharmacy: Dominic Hodges   : cabinet override      03/08/20 1112 03/08/20 1804   03/08/20 0845  ceFAZolin (ANCEF) IVPB 2g/100 mL premix        2 g 200 mL/hr over 30 Minutes Intravenous On call to O.R. 03/08/20 0757 03/08/20 1216        Objective:   Vitals:   03/09/20 1419 03/09/20 2026 03/10/20 1118 03/10/20 1228  BP: (!) 109/40 (!) 110/47  109/68  Pulse: 83 81 82 91  Resp:  17    Temp: 99 F (37.2 C) 98.7 F (37.1 C)  97.6 F (36.4 C)  TempSrc: Oral Temporal  Oral  SpO2: (!) 88% (!) 87% 93% 96%  Weight:      Height:        Wt Readings from Last 3 Encounters:  03/08/20 77.2 kg  11/19/19 72.6 kg  06/29/17 77.1 kg    Intake/Output Summary (Last 24 hours) at 03/10/2020 1852 Last data filed at 03/10/2020 1300 Gross per 24 hour  Intake 1462.5 ml  Output 700 ml  Net 762.5 ml   Physical Exam  General exam: Alert, awake, oriented x 3 Respiratory system: Clear to auscultation. Respiratory effort normal. Cardiovascular system:RRR. No murmurs, rubs, gallops. Gastrointestinal system: Abdomen is nondistended, soft and nontender. No organomegaly or masses felt. Normal bowel sounds heard. Central nervous system: Alert and oriented. No focal neurological deficits. Extremities: 1+ edema bilaterally Skin: No rashes, lesions or ulcers Psychiatry: Judgement and insight appear normal. Mood & affect appropriate.     Data Review:   Micro Results Recent Results (from the past 240 hour(s))  Surgical PCR screen     Status: None   Collection Time: 03/08/20  1:08 AM   Specimen: Nasal Mucosa; Nasal Swab  Result Value Ref Range Status   MRSA, PCR NEGATIVE NEGATIVE Final   Staphylococcus aureus NEGATIVE NEGATIVE Final    Comment: (NOTE) The Xpert SA Assay (FDA approved for NASAL specimens in patients 75 years of age and older), is one component of a comprehensive surveillance program. It is not intended to diagnose infection nor to guide or monitor treatment. Performed at [facility], [address] [place], [place] [address]     Radiology Reports DG Pelvis 1-2 Views  Result Date: 03/08/2020 CLINICAL DATA:  Status post total hip arthroplasty on the left. EXAM: PELVIS - 1-2 VIEW COMPARISON:  December 06, 2014 FINDINGS: There are postsurgical changes related to total hip arthroplasty on the left. The hardware appears grossly intact. The alignment appears near anatomic. Percutaneous pins are noted through the greater trochanter. The patient  is status post prior remote total hip arthroplasty on the right. There is diffuse osteopenia which limits evaluation. IMPRESSION: Status post total hip arthroplasty on the left. No evidence of hardware fracture or loosening. Electronically Signed   By: Constance Holster M.D.   On: 03/08/2020 19:33   US RENAL  Result Date: 03/10/2020 CLINICAL DATA:  Acute renal injury. EXAM: RENAL / URINARY TRACT ULTRASOUND COMPLETE COMPARISON:  CT 03/06/2018. FINDINGS: Right Kidney: Renal measurements: 11.4 x 6.2 7.0 cm = volume: 259.2 mL. Cortical thinning. Echogenicity within normal limits. 4.9 cm benign-appearing cyst again noted. No hydronephrosis visualized. Left Kidney: Renal measurements: 11.8 x 6.4 x 6.5 cm = volume: 253.6 mL. Echogenicity within normal limits. Benign-appearing left renal cysts are again noted, the largest measures 7.1 cm. No hydronephrosis visualized. Bladder: Bladder is nondistended.  Patient has a condom catheter. Other: None. IMPRESSION: 1. Bilateral renal cortical thinning. No acute abnormality. No hydronephrosis. 2.  Benign-appearing cyst again noted in both kidneys. Electronically Signed   By: Marcello Moores  Register   On: 03/10/2020 11:44   DG C-Arm 1-60 Min-No Report  Result Date: 03/08/2020 Fluoroscopy was utilized by the requesting physician.  No radiographic interpretation.   DG Hip Unilat With Pelvis 2-3 Views Left  Result Date: 03/07/2020 CLINICAL DATA:  [place], left hip pain EXAM: DG HIP (WITH OR WITHOUT PELVIS) 2-3V LEFT  COMPARISON:  02/01/2015 FINDINGS: Frontal view of the pelvis as well as frontal and cross-table lateral views of the left hip are obtained. There is a comminuted left femoral neck fracture. Fracture line involves the mid aspect of the left femoral neck, and extends through the greater trochanter. There is impaction and valgus angulation. No dislocation. Moderate left hip osteoarthritis. Right hip hemiarthroplasty is unremarkable. The remainder of the bony pelvis is normal. IMPRESSION: 1. Comminuted left femoral neck fracture, extending from the mid left femoral neck through the greater trochanter. Significant impaction and varus angulation. Electronically Signed   By: Randa Ngo M.D.   On: 03/07/2020 20:00     CBC Recent Labs  Lab 03/07/20 1959 03/08/20 0432 03/08/20 1957 03/09/20 0606 03/10/20 0509  WBC 8.5 10.5 15.1* 12.9* 10.0  HGB 13.0 12.2* 11.1* 10.3* 8.9*  HCT 39.9 36.4* 33.2* 31.1* 27.2*  PLT 183 177 169 151 129*  MCV 96.4 95.5 97.4 96.6 97.1  MCH 31.4 32.0 32.6 32.0 31.8  MCHC 32.6 33.5 33.4 33.1 32.7  RDW 12.6 12.5 12.7 12.5 12.6  LYMPHSABS 1.3  --   --   --   --   MONOABS 0.5  --   --   --   --   EOSABS 0.1  --   --   --   --   BASOSABS 0.0  --   --   --   --     Chemistries  Recent Labs  Lab 03/07/20 1959 03/08/20 0432 03/09/20 0606 03/10/20 0509  NA 142 140 138 135  K 4.0 4.1 4.4 4.1  CL 105 106 105 102  CO2 26 25 25 24   GLUCOSE 108* 116* 117* 101*  BUN 30* 27* 29* 40*  CREATININE 1.33* 1.13 1.31* 2.00*  CALCIUM 8.9 8.4* 7.8* 7.6*  MG  --  2.0  --   --   AST  --  19  --   --   ALT  --  14  --   --   ALKPHOS  --  48  --   --   BILITOT  --  1.1  --   --    ------------------------------------------------------------------------------------------------------------------  No results for input(s): CHOL, HDL, LDLCALC, TRIG, CHOLHDL, LDLDIRECT in the last 72 hours.  Lab Results  Component Value Date   HGBA1C 5.6 01/11/2017    ------------------------------------------------------------------------------------------------------------------ No results for input(s): TSH, T4TOTAL, T3FREE, THYROIDAB in the last 72 hours.  Invalid input(s): FREET3 ------------------------------------------------------------------------------------------------------------------ No results for input(s): VITAMINB12, FOLATE, FERRITIN, TIBC, IRON, RETICCTPCT in the last 72 hours.  Coagulation profile Recent Labs  Lab 03/07/20 1959 03/08/20 0432  INR 1.1 1.2    No results for input(s): DDIMER in the last 72 hours.  Cardiac Enzymes No results for input(s): CKMB, TROPONINI, MYOGLOBIN in the last 168 hours.  Invalid input(s): CK ------------------------------------------------------------------------------------------------------------------ No results found for: BNP   Kathie Dike M.D on 03/10/2020 at 6:52 PM  Go to [email or web address] - for contact info  [facility] - Office  [phone]

## 2020-03-10 NOTE — Progress Notes (Signed)
   ORTHOPAEDIC PROGRESS NOTE  s/p Procedure(s): Left hip hemiarthroplasty and fixation of greater trochanter fracture with tension band  DOS: 03/08/20  SUBJECTIVE: Pain control is improving.  Feels better today than yesterday.  Awaiting placement in a rehab facility. Family at bedside.  He has worked with PT today  OBJECTIVE: PE:  Resting comfortably.  No acute distress  Left hip dressing is clean, dry and intact Hip abduction pillow in place Sensation to left foot is intact Active dorsiflexion of the ankle and great toe Some swelling and discoloration over the shin distally.  Healing scratches distally.   Vitals:   03/09/20 2026 03/10/20 1228  BP: (!) 110/47 109/68  Pulse: 81 91  Resp: 17   Temp: 98.7 F (37.1 C) 97.6 F (36.4 C)  SpO2: (!) 87% 96%   XR left hip with cemented hemiarthroplasty in good position.  Hip is located.  Tension band construct with reduced greater trochanter fracture.  No acute injuries.  No hardware issues.   ASSESSMENT: Dominic Hodges Dominic Hodges is a 87 y.o. male doing well postoperatively.  PLAN: Weightbearing: PWB 50% LLE; no active hip abduction Insicional and dressing care: Dressings left intact until follow-up Orthopedic device(s): Hip abduction pillow while in bed VTE prophylaxis: Aspirin 81mg  BID 6 weeks Pain control: PRN pain medications Follow - up plan: 2 weeks   Contact information:     Almond Fitzgibbon A. Amedeo Kinsman, MD [place] [address] [place],  [place]  [address] Phone: [phone] Fax: [phone]

## 2020-03-10 NOTE — Progress Notes (Signed)
Patient has had 3 loose stools. Notified Dr. Roderic Palau, patient had been several days without one, if it does not slow Dr. Roderic Palau agreed to immodium PRN . Will continue to monitor

## 2020-03-10 NOTE — TOC Progression Note (Signed)
Transition of Care [facility]) - Progression Note    Patient Details  Name: Dominic Hodges MRN: [id number] Date of Birth: 1927-11-02  Transition of Care [facility]) CM/SW Contact  Salome Arnt, [place] Phone Number: 03/10/2020, 11:28 AM  Clinical Narrative:  LCSW updated [place] on pt. He will not require repeat COVID test if ready tomorrow. TOC will follow.      Expected Discharge Plan: [place] Barriers to Discharge: Continued Medical Work up  Expected Discharge Plan and Services Expected Discharge Plan: [place] In-house Referral: Clinical Social Work   Post Acute Care Choice: [place] Living arrangements for the past 2 months: Single Family Home                 DME Arranged: N/A                     Social Determinants of Health (SDOH) Interventions    Readmission Risk Interventions No flowsheet data found.

## 2020-03-11 ENCOUNTER — Other Ambulatory Visit: Payer: Self-pay | Admitting: Adult Health

## 2020-03-11 ENCOUNTER — Inpatient Hospital Stay
Admission: RE | Admit: 2020-03-11 | Discharge: 2020-05-03 | Disposition: A | Discharge status: Other Acute Inpatient Hospital | Payer: Medicare Other | Source: Ambulatory Visit | Attending: Internal Medicine | Admitting: Internal Medicine

## 2020-03-11 DIAGNOSIS — Z23 Encounter for immunization: Secondary | ICD-10-CM | POA: Diagnosis not present

## 2020-03-11 DIAGNOSIS — R1314 Dysphagia, pharyngoesophageal phase: Secondary | ICD-10-CM | POA: Diagnosis not present

## 2020-03-11 DIAGNOSIS — R41841 Cognitive communication deficit: Secondary | ICD-10-CM | POA: Diagnosis not present

## 2020-03-11 DIAGNOSIS — E785 Hyperlipidemia, unspecified: Secondary | ICD-10-CM | POA: Diagnosis not present

## 2020-03-11 DIAGNOSIS — D696 Thrombocytopenia, unspecified: Secondary | ICD-10-CM | POA: Diagnosis not present

## 2020-03-11 DIAGNOSIS — T17320A Food in larynx causing asphyxiation, initial encounter: Secondary | ICD-10-CM | POA: Diagnosis not present

## 2020-03-11 DIAGNOSIS — R630 Anorexia: Secondary | ICD-10-CM | POA: Diagnosis not present

## 2020-03-11 DIAGNOSIS — K449 Diaphragmatic hernia without obstruction or gangrene: Secondary | ICD-10-CM | POA: Diagnosis not present

## 2020-03-11 DIAGNOSIS — R627 Adult failure to thrive: Secondary | ICD-10-CM | POA: Diagnosis not present

## 2020-03-11 DIAGNOSIS — M549 Dorsalgia, unspecified: Secondary | ICD-10-CM | POA: Diagnosis not present

## 2020-03-11 DIAGNOSIS — E440 Moderate protein-calorie malnutrition: Secondary | ICD-10-CM | POA: Diagnosis not present

## 2020-03-11 DIAGNOSIS — R29818 Other symptoms and signs involving the nervous system: Secondary | ICD-10-CM | POA: Diagnosis not present

## 2020-03-11 DIAGNOSIS — R262 Difficulty in walking, not elsewhere classified: Secondary | ICD-10-CM | POA: Diagnosis not present

## 2020-03-11 DIAGNOSIS — Z79899 Other long term (current) drug therapy: Secondary | ICD-10-CM | POA: Diagnosis not present

## 2020-03-11 DIAGNOSIS — D62 Acute posthemorrhagic anemia: Secondary | ICD-10-CM | POA: Diagnosis not present

## 2020-03-11 DIAGNOSIS — Z431 Encounter for attention to gastrostomy: Secondary | ICD-10-CM | POA: Diagnosis not present

## 2020-03-11 DIAGNOSIS — M25511 Pain in right shoulder: Secondary | ICD-10-CM | POA: Diagnosis not present

## 2020-03-11 DIAGNOSIS — M6281 Muscle weakness (generalized): Secondary | ICD-10-CM | POA: Diagnosis not present

## 2020-03-11 DIAGNOSIS — W19XXXA Unspecified fall, initial encounter: Secondary | ICD-10-CM | POA: Diagnosis not present

## 2020-03-11 DIAGNOSIS — N400 Enlarged prostate without lower urinary tract symptoms: Secondary | ICD-10-CM | POA: Diagnosis not present

## 2020-03-11 DIAGNOSIS — Z96642 Presence of left artificial hip joint: Secondary | ICD-10-CM | POA: Diagnosis not present

## 2020-03-11 DIAGNOSIS — Z9049 Acquired absence of other specified parts of digestive tract: Secondary | ICD-10-CM | POA: Diagnosis not present

## 2020-03-11 DIAGNOSIS — W19XXXD Unspecified fall, subsequent encounter: Secondary | ICD-10-CM | POA: Diagnosis not present

## 2020-03-11 DIAGNOSIS — N1831 Chronic kidney disease, stage 3a: Secondary | ICD-10-CM | POA: Diagnosis not present

## 2020-03-11 DIAGNOSIS — R4189 Other symptoms and signs involving cognitive functions and awareness: Secondary | ICD-10-CM | POA: Diagnosis not present

## 2020-03-11 DIAGNOSIS — Z20822 Contact with and (suspected) exposure to covid-19: Secondary | ICD-10-CM | POA: Diagnosis not present

## 2020-03-11 DIAGNOSIS — G8929 Other chronic pain: Secondary | ICD-10-CM | POA: Diagnosis not present

## 2020-03-11 DIAGNOSIS — R059 Cough, unspecified: Secondary | ICD-10-CM | POA: Diagnosis not present

## 2020-03-11 DIAGNOSIS — B965 Pseudomonas (aeruginosa) (mallei) (pseudomallei) as the cause of diseases classified elsewhere: Secondary | ICD-10-CM | POA: Diagnosis not present

## 2020-03-11 DIAGNOSIS — D649 Anemia, unspecified: Secondary | ICD-10-CM | POA: Diagnosis not present

## 2020-03-11 DIAGNOSIS — K5909 Other constipation: Secondary | ICD-10-CM | POA: Diagnosis not present

## 2020-03-11 DIAGNOSIS — N179 Acute kidney failure, unspecified: Secondary | ICD-10-CM

## 2020-03-11 DIAGNOSIS — M48062 Spinal stenosis, lumbar region with neurogenic claudication: Secondary | ICD-10-CM | POA: Diagnosis not present

## 2020-03-11 DIAGNOSIS — S72032A Displaced midcervical fracture of left femur, initial encounter for closed fracture: Secondary | ICD-10-CM | POA: Diagnosis not present

## 2020-03-11 DIAGNOSIS — N390 Urinary tract infection, site not specified: Secondary | ICD-10-CM | POA: Diagnosis not present

## 2020-03-11 DIAGNOSIS — R634 Abnormal weight loss: Secondary | ICD-10-CM | POA: Diagnosis not present

## 2020-03-11 DIAGNOSIS — T17908S Unspecified foreign body in respiratory tract, part unspecified causing other injury, sequela: Secondary | ICD-10-CM | POA: Diagnosis present

## 2020-03-11 DIAGNOSIS — R1312 Dysphagia, oropharyngeal phase: Secondary | ICD-10-CM | POA: Diagnosis not present

## 2020-03-11 DIAGNOSIS — I10 Essential (primary) hypertension: Secondary | ICD-10-CM | POA: Diagnosis not present

## 2020-03-11 DIAGNOSIS — N1832 Chronic kidney disease, stage 3b: Secondary | ICD-10-CM | POA: Diagnosis not present

## 2020-03-11 DIAGNOSIS — Z96643 Presence of artificial hip joint, bilateral: Secondary | ICD-10-CM | POA: Diagnosis not present

## 2020-03-11 DIAGNOSIS — S72112D Displaced fracture of greater trochanter of left femur, subsequent encounter for closed fracture with routine healing: Secondary | ICD-10-CM | POA: Diagnosis not present

## 2020-03-11 DIAGNOSIS — Z87891 Personal history of nicotine dependence: Secondary | ICD-10-CM | POA: Diagnosis not present

## 2020-03-11 DIAGNOSIS — S72032D Displaced midcervical fracture of left femur, subsequent encounter for closed fracture with routine healing: Secondary | ICD-10-CM | POA: Diagnosis not present

## 2020-03-11 DIAGNOSIS — I129 Hypertensive chronic kidney disease with stage 1 through stage 4 chronic kidney disease, or unspecified chronic kidney disease: Secondary | ICD-10-CM | POA: Diagnosis not present

## 2020-03-11 DIAGNOSIS — Z888 Allergy status to other drugs, medicaments and biological substances status: Secondary | ICD-10-CM | POA: Diagnosis not present

## 2020-03-11 DIAGNOSIS — Z9181 History of falling: Secondary | ICD-10-CM | POA: Diagnosis not present

## 2020-03-11 DIAGNOSIS — S72032S Displaced midcervical fracture of left femur, sequela: Secondary | ICD-10-CM | POA: Diagnosis not present

## 2020-03-11 DIAGNOSIS — N183 Chronic kidney disease, stage 3 unspecified: Secondary | ICD-10-CM | POA: Diagnosis not present

## 2020-03-11 DIAGNOSIS — Z87898 Personal history of other specified conditions: Secondary | ICD-10-CM | POA: Diagnosis not present

## 2020-03-11 DIAGNOSIS — R1310 Dysphagia, unspecified: Secondary | ICD-10-CM | POA: Diagnosis not present

## 2020-03-11 DIAGNOSIS — E43 Unspecified severe protein-calorie malnutrition: Secondary | ICD-10-CM | POA: Diagnosis not present

## 2020-03-11 DIAGNOSIS — E782 Mixed hyperlipidemia: Secondary | ICD-10-CM | POA: Diagnosis not present

## 2020-03-11 DIAGNOSIS — S72002D Fracture of unspecified part of neck of left femur, subsequent encounter for closed fracture with routine healing: Secondary | ICD-10-CM | POA: Diagnosis not present

## 2020-03-11 LAB — URINALYSIS, ROUTINE W REFLEX MICROSCOPIC
Bilirubin Urine: NEGATIVE
Glucose, UA: NEGATIVE mg/dL
Ketones, ur: NEGATIVE mg/dL
Leukocytes,Ua: NEGATIVE
Nitrite: NEGATIVE
Protein, ur: NEGATIVE mg/dL
Specific Gravity, Urine: 1.009 (ref 1.005–1.030)
pH: 5 (ref 5.0–8.0)

## 2020-03-11 LAB — RENAL FUNCTION PANEL
Albumin: 2.7 g/dL — ABNORMAL LOW (ref 3.5–5.0)
Anion gap: 7 (ref 5–15)
BUN: 41 mg/dL — ABNORMAL HIGH (ref 8–23)
CO2: 25 mmol/L (ref 22–32)
Calcium: 7.6 mg/dL — ABNORMAL LOW (ref 8.9–10.3)
Chloride: 105 mmol/L (ref 98–111)
Creatinine, Ser: 1.84 mg/dL — ABNORMAL HIGH (ref 0.61–1.24)
GFR, Estimated: 34 mL/min — ABNORMAL LOW (ref >60)
Glucose, Bld: 105 mg/dL — ABNORMAL HIGH (ref 70–99)
Phosphorus: 3 mg/dL (ref 2.5–4.6)
Potassium: 3.5 mmol/L (ref 3.5–5.1)
Sodium: 137 mmol/L (ref 135–145)

## 2020-03-11 LAB — GLUCOSE, CAPILLARY
Glucose-Capillary: 93 mg/dL (ref 70–99)
Glucose-Capillary: 88 mg/dL (ref 70–99)

## 2020-03-11 MED ORDER — OXYCODONE HCL 5 MG PO TABS: 5.0000 mg | ORAL_TABLET | ORAL | 0 refills | Status: DC | PRN

## 2020-03-11 MED ORDER — ASPIRIN 81 MG PO CHEW: 81.0000 mg | CHEWABLE_TABLET | Freq: Two times a day (BID) | ORAL | Status: DC

## 2020-03-11 MED ORDER — POLYETHYLENE GLYCOL 3350 17 G PO PACK: 17.0000 g | PACK | Freq: Every day | ORAL | Status: DC | PRN

## 2020-03-11 MED ORDER — OXYCODONE HCL 5 MG PO TABS
5.0000 mg | ORAL_TABLET | ORAL | 0 refills | Status: DC | PRN
Start: 2020-03-11 — End: 2020-03-11

## 2020-03-11 MED ORDER — CYCLOBENZAPRINE HCL 5 MG PO TABS: 5.0000 mg | ORAL_TABLET | Freq: Every day | ORAL | 0 refills | Status: DC

## 2020-03-11 MED ORDER — POLYETHYLENE GLYCOL 3350 17 G PO PACK: 17.0000 g | PACK | Freq: Every day | ORAL | 0 refills | Status: DC | PRN

## 2020-03-11 NOTE — TOC Transition Note (Signed)
Transition of Care [facility]) - CM/SW Discharge Note   Patient Details  Name: Dequan Kindred MRN: [id number] Date of Birth: 07/29/1927  Transition of Care [facility]) CM/SW Contact:  Natasha Bence, LCSW Phone Number: 03/11/2020, 1:15 PM   Clinical Narrative:    CSW received notification of patient's readiness for discharge. CSW notified [facility]. Kerri with [facility] agreeable to take patient. CSW updated patient's wife. Nurse to call report. TOC signing off.    Final next level of care: Skilled Nursing Facility Barriers to Discharge: Barriers Resolved   Patient Goals and CMS Choice Patient states their goals for this hospitalization and ongoing recovery are:: Rehab with SNF CMS Medicare.gov Compare Post Acute Care list provided to:: Patient Choice offered to / list presented to : Patient  Discharge Placement   Existing PASRR number confirmed : 03/11/20            Patient to be transferred to facility by: [facility] Name of family member notified: McInnis,Peggy Patient and family notified of of transfer: 03/11/20  Discharge Plan and Services In-house Referral: Clinical Social Work   Post Acute Care Choice: [place]          DME Arranged: Chest tube flexiseal,N/A DME Agency: NA       HH Arranged: NA HH Agency: NA        Social Determinants of Health (SDOH) Interventions     Readmission Risk Interventions No flowsheet data found.

## 2020-03-11 NOTE — Care Management Important Message (Signed)
Important Message  Patient Details  Name: Dominic Hodges MRN: [id number] Date of Birth: [birth year]   Medicare Important Message Given:  Yes     [name] 03/11/2020, 12:23 PM

## 2020-03-11 NOTE — Discharge Summary (Signed)
Physician Discharge Summary  Dominic Hodges FBP:[id number] DOB: 05-19-1927 DOA: 03/07/2020  PCP: Celene Squibb, MD  Admit date: 03/07/2020 Discharge date: 03/11/2020  Admitted From: home Disposition:  home  Recommendations for Outpatient Follow-up:  1. Follow up with PCP in 1-2 weeks 2. Please obtain BMP/CBC in one week 3. Follow up with ortho in 2 weeks  Discharge Condition:stable CODE STATUS:full code Diet recommendation: heart healthy  Brief/Interim Summary: 87 y/o male with PMH of HTN, CKD 3, HLD, admitted with left hip fracture from mechanical fall. He underwent operative management on 3/1. Post operatively, he developed AKI, which has since started to improve. He also has anemia, which was felt to be dilutional as well as some component of blood loss during surgery. He will need repeat CBC/BMET in 1 week. Seen by physical therapy with recommendations for SNF.  Discharge Diagnoses:  Principal Problem:   Closed left femoral fracture (HCC) Active Problems:   Essential hypertension, benign   Hyperlipidemia   Accidental fall   CKD (chronic kidney disease), stage III (HCC)  1)LeftHip Displaced femoral neck fracture--status post mechanical fall  --pain control with p.o. oxycodone and IV morphine sulfate as needed, -Patient underwent operative management on 3/1 -Postop care and postop DVT prophylaxis as per orthopedic team -Seen by physical therapy with recommendation for skilled nurse facility placement -weightbearing: partial weight bearing 50% LLE, no active hip abduction -dressing should be left intact until follow up -he will need hip abduction pillow while in bed -ASA BID for DVT prophylaxis for 6 weeks -Follow up with ortho in 2 weeks  2)Aki on CKD 3A- -creatinine initially trended down to 1.1 from 1.33 on admission after hydration  -renally adjust medications, avoid nephrotoxic agents / dehydration  / hypotension -creatinine subsequently peaked at 2.0, but has since  trended down to 1.8 -Renal ultrasound unrevealing -urinalysis unrevealing -he is not on any nephrotoxic agents -repeat BMET in 1 week  3)HTN--- continue clonidine patch, IV labetalol as needed elevated BP  4) acute anemia--due to hip fracture with acute blood loss as well as some component of hemodilution -Hgb is currently 8.9 from a baseline usually between 13 and 14 -no signs of ongoing bleeding -repeat CBC in 1 week  5)HLD--- continue Crestor  Discharge Instructions  Discharge Instructions    Diet - low sodium heart healthy   Complete by: As directed    Discharge wound care:   Complete by: As directed    Keep incision clean and dry   Increase activity slowly   Complete by: As directed      Allergies as of 03/11/2020      Reactions   Robaxin [methocarbamol] Other (See Comments)   "made me crazy"   Robaxin [methocarbamol] Other (See Comments)   Mental confusion      Medication List    STOP taking these medications   aspirin EC 81 MG tablet Replaced by: aspirin 81 MG chewable tablet   HYDROcodone-acetaminophen 5-325 MG tablet Commonly known as: NORCO/VICODIN   predniSONE 10 MG (48) Tbpk tablet Commonly known as: STERAPRED UNI-PAK 48 TAB     TAKE these medications   aspirin 81 MG chewable tablet Chew 1 tablet (81 mg total) by mouth 2 (two) times daily. Replaces: aspirin EC 81 MG tablet   cholecalciferol 1000 units tablet Commonly known as: VITAMIN D Take 5,000 Units by mouth daily.   cloNIDine 0.1 mg/24hr patch Commonly known as: CATAPRES - Dosed in mg/24 hr Place 0.1 mg onto the skin once  a week. TUESDAY   cyclobenzaprine 5 MG tablet Commonly known as: FLEXERIL Take 1 tablet (5 mg total) by mouth at bedtime.   fluorometholone 0.1 % ophthalmic suspension Commonly known as: FML Place 1 drop into both eyes 2 (two) times daily.   nystatin cream Commonly known as: MYCOSTATIN Apply topically 2 (two) times daily.   nystatin-triamcinolone  cream Commonly known as: MYCOLOG II Apply topically 2 (two) times daily.   oxyCODONE 5 MG immediate release tablet Commonly known as: Oxy IR/ROXICODONE Take 1 tablet (5 mg total) by mouth every 4 (four) hours as needed for moderate pain.   polyethylene glycol 17 g packet Commonly known as: MIRALAX / GLYCOLAX Take 17 g by mouth daily as needed.   rosuvastatin 20 MG tablet Commonly known as: CRESTOR Take 20 mg by mouth daily.            Discharge Care Instructions  (From admission, onward)         Start     Ordered   03/11/20 0000  Discharge wound care:       Comments: Keep incision clean and dry   03/11/20 1202          Contact information for after-discharge care    [facility] .   Service: Skilled Nursing Contact information: [address] [place] [address] [phone]                 Allergies  Allergen Reactions  . Robaxin [Methocarbamol] Other (See Comments)    "made me crazy"  . Robaxin [Methocarbamol] Other (See Comments)    Mental confusion    Consultations:  orthopedics   Procedures/Studies: DG Pelvis 1-2 Views  Result Date: 03/08/2020 CLINICAL DATA:  Status post total hip arthroplasty on the left. EXAM: PELVIS - 1-2 VIEW COMPARISON:  December 06, 2014 FINDINGS: There are postsurgical changes related to total hip arthroplasty on the left. The hardware appears grossly intact. The alignment appears near anatomic. Percutaneous pins are noted through the greater trochanter. The patient is status post prior remote total hip arthroplasty on the right. There is diffuse osteopenia which limits evaluation. IMPRESSION: Status post total hip arthroplasty on the left. No evidence of hardware fracture or loosening. Electronically Signed   By: Constance Holster M.D.   On: 03/08/2020 19:33   US RENAL  Result Date: 03/10/2020 CLINICAL DATA:  Acute renal injury. EXAM: RENAL / URINARY TRACT  ULTRASOUND COMPLETE COMPARISON:  CT 03/06/2018. FINDINGS: Right Kidney: Renal measurements: 11.4 x 6.2 7.0 cm = volume: 259.2 mL. Cortical thinning. Echogenicity within normal limits. 4.9 cm benign-appearing cyst again noted. No hydronephrosis visualized. Left Kidney: Renal measurements: 11.8 x 6.4 x 6.5 cm = volume: 253.6 mL. Echogenicity within normal limits. Benign-appearing left renal cysts are again noted, the largest measures 7.1 cm. No hydronephrosis visualized. Bladder: Bladder is nondistended.  Patient has a condom catheter. Other: None. IMPRESSION: 1. Bilateral renal cortical thinning. No acute abnormality. No hydronephrosis. 2.  Benign-appearing cyst again noted in both kidneys. Electronically Signed   By: Marcello Moores  Register   On: 03/10/2020 11:44   DG C-Arm 1-60 Min-No Report  Result Date: 03/08/2020 Fluoroscopy was utilized by the requesting physician.  No radiographic interpretation.   DG Hip Unilat With Pelvis 2-3 Views Left  Result Date: 03/07/2020 CLINICAL DATA:  [place], left hip pain EXAM: DG HIP (WITH OR WITHOUT PELVIS) 2-3V LEFT COMPARISON:  02/01/2015 FINDINGS: Frontal view of the pelvis as well  as frontal and cross-table lateral views of the left hip are obtained. There is a comminuted left femoral neck fracture. Fracture line involves the mid aspect of the left femoral neck, and extends through the greater trochanter. There is impaction and valgus angulation. No dislocation. Moderate left hip osteoarthritis. Right hip hemiarthroplasty is unremarkable. The remainder of the bony pelvis is normal. IMPRESSION: 1. Comminuted left femoral neck fracture, extending from the mid left femoral neck through the greater trochanter. Significant impaction and varus angulation. Electronically Signed   By: Randa Ngo M.D.   On: 03/07/2020 20:00       Subjective: Has some cramping in leg, otherwise no new complaints  Discharge Exam: Vitals:   03/10/20 1118 03/10/20 1228 03/10/20 2302 03/11/20  0700  BP:  109/68 (!) 95/49 (!) 105/58  Pulse: 82 91 76 83  Resp:   16 16  Temp:  97.6 F (36.4 C) 99.8 F (37.7 C) 98.2 F (36.8 C)  TempSrc:  Oral Oral Oral  SpO2: 93% 96% 92% 94%  Weight:      Height:        General: Pt is alert, awake, not in acute distress Cardiovascular: RRR, S1/S2 +, no rubs, no gallops Respiratory: CTA bilaterally, no wheezing, no rhonchi Abdominal: Soft, NT, ND, bowel sounds + Extremities: 1+ edema, no cyanosis    The results of significant diagnostics from this hospitalization (including imaging, microbiology, ancillary and laboratory) are listed below for reference.     Microbiology: Recent Results (from the past 240 hour(s))  Surgical PCR screen     Status: None   Collection Time: 03/08/20  1:08 AM   Specimen: Nasal Mucosa; Nasal Swab  Result Value Ref Range Status   MRSA, PCR NEGATIVE NEGATIVE Final   Staphylococcus aureus NEGATIVE NEGATIVE Final    Comment: (NOTE) The Xpert SA Assay (FDA approved for NASAL specimens in patients 78 years of age and older), is one component of a comprehensive surveillance program. It is not intended to diagnose infection nor to guide or monitor treatment. Performed at [facility], [address], [place], [place] [address]      Labs: BNP (last 3 results) No results for input(s): BNP in the last 8760 hours. Basic Metabolic Panel: Recent Labs  Lab 03/07/20 1959 03/08/20 0432 03/09/20 0606 03/10/20 0509 03/11/20 0521  NA 142 140 138 135 137  K 4.0 4.1 4.4 4.1 3.5  CL 105 106 105 102 105  CO2 26 25 25 24 25   GLUCOSE 108* 116* 117* 101* 105*  BUN 30* 27* 29* 40* 41*  CREATININE 1.33* 1.13 1.31* 2.00* 1.84*  CALCIUM 8.9 8.4* 7.8* 7.6* 7.6*  MG  --  2.0  --   --   --   PHOS  --  3.8  --   --  3.0   Liver Function Tests: Recent Labs  Lab 03/08/20 0432 03/11/20 0521  AST 19  --   ALT 14  --   ALKPHOS 48  --   BILITOT 1.1  --   PROT 6.0*  --   ALBUMIN 3.7 2.7*   No results for input(s):  LIPASE, AMYLASE in the last 168 hours. No results for input(s): AMMONIA in the last 168 hours. CBC: Recent Labs  Lab 03/07/20 1959 03/08/20 0432 03/08/20 1957 03/09/20 0606 03/10/20 0509  WBC 8.5 10.5 15.1* 12.9* 10.0  NEUTROABS 6.5  --   --   --   --   HGB 13.0 12.2* 11.1* 10.3* 8.9*  HCT 39.9  36.4* 33.2* 31.1* 27.2*  MCV 96.4 95.5 97.4 96.6 97.1  PLT 183 177 169 151 129*   Cardiac Enzymes: No results for input(s): CKTOTAL, CKMB, CKMBINDEX, TROPONINI in the last 168 hours. BNP: Invalid input(s): POCBNP CBG: Recent Labs  Lab 03/10/20 0741 03/10/20 1112 03/10/20 1610 03/11/20 0225 03/11/20 1129  GLUCAP 97 142* 108* 93 88   D-Dimer No results for input(s): DDIMER in the last 72 hours. Hgb A1c No results for input(s): HGBA1C in the last 72 hours. Lipid Profile No results for input(s): CHOL, HDL, LDLCALC, TRIG, CHOLHDL, LDLDIRECT in the last 72 hours. Thyroid function studies No results for input(s): TSH, T4TOTAL, T3FREE, THYROIDAB in the last 72 hours.  Invalid input(s): FREET3 Anemia work up No results for input(s): VITAMINB12, FOLATE, FERRITIN, TIBC, IRON, RETICCTPCT in the last 72 hours. Urinalysis    Component Value Date/Time   COLORURINE YELLOW 03/10/2020 2314   APPEARANCEUR CLEAR 03/10/2020 2314   LABSPEC 1.009 03/10/2020 2314   PHURINE 5.0 03/10/2020 2314   GLUCOSEU NEGATIVE 03/10/2020 2314   HGBUR MODERATE (A) 03/10/2020 2314   BILIRUBINUR NEGATIVE 03/10/2020 2314   KETONESUR NEGATIVE 03/10/2020 2314   PROTEINUR NEGATIVE 03/10/2020 2314   NITRITE NEGATIVE 03/10/2020 2314   LEUKOCYTESUR NEGATIVE 03/10/2020 2314   Sepsis Labs Invalid input(s): PROCALCITONIN,  WBC,  LACTICIDVEN Microbiology Recent Results (from the past 240 hour(s))  Surgical PCR screen     Status: None   Collection Time: 03/08/20  1:08 AM   Specimen: Nasal Mucosa; Nasal Swab  Result Value Ref Range Status   MRSA, PCR NEGATIVE NEGATIVE Final   Staphylococcus aureus NEGATIVE  NEGATIVE Final    Comment: (NOTE) The Xpert SA Assay (FDA approved for NASAL specimens in patients 40 years of age and older), is one component of a comprehensive surveillance program. It is not intended to diagnose infection nor to guide or monitor treatment. Performed at [facility], [address], [place],  [address]      Time coordinating discharge: 78mins  SIGNED:   Kathie Dike, MD  [facility] 03/11/2020, 12:04 PM   If 7PM-7AM, please contact night-coverage [email or web address]

## 2020-03-11 NOTE — Progress Notes (Signed)
Physical Therapy Treatment Patient Details Name: Dominic Hodges MRN: [id number] DOB: 1927/02/21 Today's Date: 03/11/2020    History of Present Illness Dominic Hodges is a 87 y.o. male s/p  Left hip hemiarthroplasty with tension band construct for greater trochanter fragment on 03/08/20 with medical history significant for Hypertension, HLD who presents to the ED accompanied by wife due to left hip pain.  Patient lost his balance while ambulating with a walker and accidentally sustained a fall landing on his left hip with a subsequent severe sharp pain at the hip.  He denies hitting his head or losing consciousness.  Patient denies chest pain or shortness of breath.  He was well prior to the fall.  EMS was activated and patient was sent to the ED for further evaluation and management.    PT Comments    Attempted patient rolling in bed with mod/max assist working with nursing staff to get patient on bed pan but patient unable to roll far enough despite extensive assist. Patient requires max assist +2 to transition to seated EOB and shows impaired sitting balance requiring assist to remain seated initially. Patient's seated balance improves after about 5 minutes and he is able to maintain balance without assist. Patient demonstrates good sitting tolerance EOB. Patient requires several attempts to transfer to standing with RW, max assist, and cueing for sequencing. Patient able to stand for 3-4 minutes with mod/max assist and RW while getting cleaned up by nursing. Patient assisted back into bed at end of session as he was limited by fatigue and c/o hip pain. Patient left with nursing staff present. Patient will benefit from continued physical therapy in hospital and recommended venue below to increase strength, balance, endurance for safe ADLs and gait.   Follow Up Recommendations  SNF     Equipment Recommendations  None recommended by PT    Recommendations for Other Services        Precautions / Restrictions Precautions Precautions: Fall;Posterior Hip Precaution Booklet Issued: No Precaution Comments: The patient will be 50% PWB on the operative extremity.  Hip abduction pillow in place at all times when in bed  Posterior hip precautions; patient to limit hip abduction until follow up. Restrictions Weight Bearing Restrictions: Yes LLE Weight Bearing: Partial weight bearing LLE Partial Weight Bearing Percentage or Pounds: 50% PWB LLE    Mobility  Bed Mobility Overal bed mobility: Needs Assistance Bed Mobility: Supine to Sit;Sit to Supine;Rolling Rolling: Mod assist;Max assist   Supine to sit: Max assist Sit to supine: Max assist   General bed mobility comments: slow labored movement with c/o increased pain left hip with assist from nursing staff    Transfers Overall transfer level: Needs assistance Equipment used: Rolling walker (2 wheeled) Transfers: Sit to/from Stand;Stand Pivot Transfers Sit to Stand: Max assist;From elevated surface         General transfer comment: requires max assist to transfer to standing with use of RW, cueing for sequencing and RW use  Ambulation/Gait                 Stairs             Wheelchair Mobility    Modified Rankin (Stroke Patients Only)       Balance Overall balance assessment: Needs assistance Sitting-balance support: Feet supported;No upper extremity supported Sitting balance-Leahy Scale: Fair Sitting balance - Comments: fair/poor seated at EOB, improves to fair   Standing balance support: Bilateral upper extremity supported Standing balance-Leahy Scale: Poor  Standing balance comment: using RW with 50% PWB on LLE                            Cognition Arousal/Alertness: Awake/alert Behavior During Therapy: WFL for tasks assessed/performed Overall Cognitive Status: Within Functional Limits for tasks assessed                                        Exercises       General Comments        Pertinent Vitals/Pain Pain Assessment: Faces Faces Pain Scale: Hurts even more Pain Location: left hip with movement Pain Descriptors / Indicators: Sore;Grimacing;Guarding Pain Intervention(s): Limited activity within patient's tolerance;Monitored during session;Repositioned    Home Living                      Prior Function            PT Goals (current goals can now be found in the care plan section) Acute Rehab PT Goals Patient Stated Goal: return home after rehab PT Goal Formulation: With patient Time For Goal Achievement: 03/23/20 Potential to Achieve Goals: Good Progress towards PT goals: Progressing toward goals    Frequency    Min 4X/week      PT Plan Current plan remains appropriate    Co-evaluation PT/OT/SLP Co-Evaluation/Treatment: Yes            AM-PAC PT "6 Clicks" Mobility   Outcome Measure  Help needed turning from your back to your side while in a flat bed without using bedrails?: A Lot Help needed moving from lying on your back to sitting on the side of a flat bed without using bedrails?: A Lot Help needed moving to and from a bed to a chair (including a wheelchair)?: A Lot Help needed standing up from a chair using your arms (e.g., wheelchair or bedside chair)?: A Lot Help needed to walk in hospital room?: Total Help needed climbing 3-5 steps with a railing? : Total 6 Click Score: 10    End of Session Equipment Utilized During Treatment: Gait belt Activity Tolerance: Patient tolerated treatment well;Patient limited by fatigue;Patient limited by pain Patient left: with call bell/phone within reach;in bed;with bed alarm set;with family/visitor present Nurse Communication: Mobility status PT Visit Diagnosis: Unsteadiness on feet (R26.81);Other abnormalities of gait and mobility (R26.89);Muscle weakness (generalized) (M62.81)     Time: 9179-1505 PT Time Calculation (min) (ACUTE ONLY): 36  min  Charges:  $Therapeutic Activity: 23-37 mins                     11:10 AM, 03/11/20 Mearl Latin PT, DPT Physical Therapist at [facility]

## 2020-03-11 NOTE — Progress Notes (Signed)
Nsg Discharge Note  Admit Date:  03/07/2020 Discharge date: 03/11/2020   [facility] to be D/C'd Skilled nursing facility per MD order.  AVS completed.  Copy for chart, and copy for patient signed, and dated. Patient/caregiver able to verbalize understanding.  Discharge Medication: Allergies as of 03/11/2020      Reactions   Robaxin [methocarbamol] Other (See Comments)   "made me crazy"   Robaxin [methocarbamol] Other (See Comments)   Mental confusion      Medication List    STOP taking these medications   aspirin EC 81 MG tablet Replaced by: aspirin 81 MG chewable tablet   HYDROcodone-acetaminophen 5-325 MG tablet Commonly known as: NORCO/VICODIN   predniSONE 10 MG (48) Tbpk tablet Commonly known as: STERAPRED UNI-PAK 48 TAB     TAKE these medications   aspirin 81 MG chewable tablet Chew 1 tablet (81 mg total) by mouth 2 (two) times daily. Replaces: aspirin EC 81 MG tablet   cholecalciferol 1000 units tablet Commonly known as: VITAMIN D Take 5,000 Units by mouth daily.   cloNIDine 0.1 mg/24hr patch Commonly known as: CATAPRES - Dosed in mg/24 hr Place 0.1 mg onto the skin once a week. TUESDAY   cyclobenzaprine 5 MG tablet Commonly known as: FLEXERIL Take 1 tablet (5 mg total) by mouth at bedtime.   fluorometholone 0.1 % ophthalmic suspension Commonly known as: FML Place 1 drop into both eyes 2 (two) times daily.   nystatin cream Commonly known as: MYCOSTATIN Apply topically 2 (two) times daily.   nystatin-triamcinolone cream Commonly known as: MYCOLOG II Apply topically 2 (two) times daily.   oxyCODONE 5 MG immediate release tablet Commonly known as: Oxy IR/ROXICODONE Take 1 tablet (5 mg total) by mouth every 4 (four) hours as needed for moderate pain.   polyethylene glycol 17 g packet Commonly known as: MIRALAX / GLYCOLAX Take 17 g by mouth daily as needed.   rosuvastatin 20 MG tablet Commonly known as: CRESTOR Take 20 mg by mouth daily.             Discharge Care Instructions  (From admission, onward)         Start     Ordered   03/11/20 0000  Discharge wound care:       Comments: Keep incision clean and dry   03/11/20 1202          Discharge Assessment: Vitals:   03/11/20 0700 03/11/20 1347  BP: (!) 105/58 (!) 112/50  Pulse: 83 83  Resp: 16 18  Temp: 98.2 F (36.8 C) 99.5 F (37.5 C)  SpO2: 94% 99%   Skin clean, dry and intact without evidence of skin break down, no evidence of skin tears noted. IV catheter discontinued intact. Site without signs and symptoms of complications - no redness or edema noted at insertion site, patient denies c/o pain - only slight tenderness at site.  Dressing with slight pressure applied.  D/c Instructions-Education: Discharge instructions given to patient/family with verbalized understanding. D/c education completed with patient/family including follow up instructions, medication list, d/c activities limitations if indicated, with other d/c instructions as indicated by MD - patient able to verbalize understanding, all questions fully answered. Patient instructed to return to ED, call 911, or call MD for any changes in condition.  Patient escorted via [place], and D/C home via private auto.  Dorcas Mcmurray, LPN 08/16/2117 4:17 PM

## 2020-03-14 ENCOUNTER — Other Ambulatory Visit (HOSPITAL_COMMUNITY)
Admission: RE | Admit: 2020-03-14 | Discharge: 2020-03-14 | Disposition: A | Discharge status: Home / Self Care | Payer: Medicare Other | Source: Skilled Nursing Facility | Attending: Adult Health | Admitting: Adult Health

## 2020-03-14 DIAGNOSIS — N390 Urinary tract infection, site not specified: Secondary | ICD-10-CM | POA: Diagnosis present

## 2020-03-14 LAB — URINALYSIS, COMPLETE (UACMP) WITH MICROSCOPIC
Bacteria, UA: NONE SEEN
Bilirubin Urine: NEGATIVE
Glucose, UA: NEGATIVE mg/dL
Hgb urine dipstick: NEGATIVE
Ketones, ur: NEGATIVE mg/dL
Leukocytes,Ua: NEGATIVE
Nitrite: NEGATIVE
Protein, ur: 30 mg/dL — AB
Specific Gravity, Urine: 1.018 (ref 1.005–1.030)
pH: 5 (ref 5.0–8.0)

## 2020-03-15 ENCOUNTER — Non-Acute Institutional Stay (SKILLED_NURSING_FACILITY): Payer: Medicare Other | Admitting: Adult Health

## 2020-03-15 ENCOUNTER — Encounter: Payer: Self-pay | Admitting: Adult Health

## 2020-03-15 DIAGNOSIS — N183 Chronic kidney disease, stage 3 unspecified: Secondary | ICD-10-CM

## 2020-03-15 DIAGNOSIS — K5909 Other constipation: Secondary | ICD-10-CM | POA: Diagnosis not present

## 2020-03-15 DIAGNOSIS — D62 Acute posthemorrhagic anemia: Secondary | ICD-10-CM | POA: Diagnosis not present

## 2020-03-15 DIAGNOSIS — I10 Essential (primary) hypertension: Secondary | ICD-10-CM | POA: Diagnosis not present

## 2020-03-15 DIAGNOSIS — E782 Mixed hyperlipidemia: Secondary | ICD-10-CM | POA: Diagnosis not present

## 2020-03-15 DIAGNOSIS — D696 Thrombocytopenia, unspecified: Secondary | ICD-10-CM | POA: Insufficient documentation

## 2020-03-15 DIAGNOSIS — M48062 Spinal stenosis, lumbar region with neurogenic claudication: Secondary | ICD-10-CM

## 2020-03-15 DIAGNOSIS — S72032S Displaced midcervical fracture of left femur, sequela: Secondary | ICD-10-CM | POA: Diagnosis not present

## 2020-03-15 NOTE — Progress Notes (Signed)
Location:  [facility] Room Number: 2322 Place of Service:  SNF (31)   CODE STATUS: full   Allergies  Allergen Reactions  . Robaxin [Methocarbamol] Other (See Comments)    "made me crazy"  . Robaxin [Methocarbamol] Other (See Comments)    Mental confusion    Chief Complaint  Patient presents with  . Hospitalization Follow-up    HPI:   He is a 87 year old man who has been hospitalized from 03-07-20 through 03-11-20. His medical history includes: hypertension; ckd stage 3; hyperlipidemia. He had a mechanical fall and suffered a left hip displaced femoral neck fracture. He had a left hip arthroplasty on 03-08-20. He is partial weight bearing at 50%. His ckd stage 3a: creat is improving from a peak of 2.0 to discharge creat at 1.84. this will need to be monitored. He suffered acute blood loss anemia with hgb of 8.9.  He is here for short term rehab with his goal to return home. He does have left leg spasms at night for which he taking flexeril 5 mg nightly. He denies any poor appetite; no constipation. 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 Acid reflux    mild-states if doesnt chew food completely he begins to cough- states has weakness in muscles due to ilieus- has been doing exreci this  . Adynamic ileus ([place]) 12/2014   ? anesthesia related per pt  . BPH (benign prostatic hyperplasia)   . Complication of anesthesia    10/11/15- per pt "had [place]- in 11/16-related it to possibly anesthesia"  . H/O seasonal allergies   . Hyperlipidemia   . Hypertension     Past Surgical History:  Procedure Laterality Date  . APPENDECTOMY     approx 30 years ago/lwb  . CATARACT EXTRACTION W/PHACO Left 09/11/2012   Procedure: CATARACT  EXTRACTION PHACO AND INTRAOCULAR LENS PLACEMENT (IOC);  Surgeon: Tonny Branch, MD;  Location: [facility];  Service: Ophthalmology;  Laterality: Left;  CDE: 19.52  . HIP ARTHROPLASTY Right 12/06/2014   Procedure: PARTIAL RIGHT HIP REPLACEMENT;  Surgeon: Carole Civil, MD;  Location: [facility];  Service: Orthopedics;  Laterality: Right;  . HIP ARTHROPLASTY Left 03/08/2020   Procedure: ARTHROPLASTY BIPOLAR HIP (HEMIARTHROPLASTY);  Surgeon: Mordecai Rasmussen, MD;  Location: [facility];  Service: Orthopedics;  Laterality: Left;  . LUMBAR LAMINECTOMY/DECOMPRESSION MICRODISCECTOMY N/A 10/19/2015   Procedure: CENTRAL DECOMPRESSION LUMBAR LAMINECTOMY FOR SPINAL STENOSIS, L3,L4FORAMINOTOMY BILATERAL L3,L4 NERVE ROOT;  Surgeon: Latanya Maudlin, MD;  Location: [facility];  Service: Orthopedics;  Laterality: N/A;  . UMBILICAL HERNIA REPAIR     Approx 10 years ago/lwb  . ureteral stone extraction      Social History   Socioeconomic History  . Marital status: Married    Spouse name: Not on file  . Number of children: Not on file  . Years of education: Not on file  . Highest education level: Not on file  Occupational History  . Not on file  Tobacco Use  . Smoking status: Former Smoker    Types: Pipe  . Smokeless tobacco: Never Used  Substance and Sexual Activity  . Alcohol use: Yes    Comment: occasional social drink/lwb  . Drug use: No  . Sexual activity: Not on file  Other Topics Concern  .  Not on file  Social History Narrative  . Not on file   Social Determinants of Health   Financial Resource Strain: Not on file  Food Insecurity: Not on file  Transportation Needs: Not on file  Physical Activity: Not on file  Stress: Not on file  Social Connections: Not on file  Intimate Partner Violence: Not on file   Family History  Problem Relation Age of Onset  . Coronary artery disease Other        no family history of premature CAD      VITAL SIGNS BP 126/64   Pulse 62   Temp 98 F (36.7 C)   Resp  18   Ht 6' 0.5" (1.842 m)   Wt 151 lb (68.5 kg)   SpO2 95%   BMI 20.20 kg/m   Outpatient Encounter Medications as of 03/15/2020  Medication Sig Note  . aspirin 81 MG chewable tablet Chew 1 tablet (81 mg total) by mouth 2 (two) times daily.   . cholecalciferol (VITAMIN D) 1000 units tablet Take 5,000 Units by mouth daily. (Patient not taking: Reported on 11/19/2019)   . cloNIDine (CATAPRES - DOSED IN MG/24 HR) 0.1 mg/24hr patch Place 0.1 mg onto the skin once a week. TUESDAY 03/07/2020: Due to changed  . cyclobenzaprine (FLEXERIL) 5 MG tablet Take 1 tablet (5 mg total) by mouth at bedtime.   . fluorometholone (FML) 0.1 % ophthalmic suspension Place 1 drop into both eyes 2 (two) times daily.   Marland Kitchen nystatin cream (MYCOSTATIN) Apply topically 2 (two) times daily. (Patient not taking: No sig reported)   . nystatin-triamcinolone (MYCOLOG II) cream Apply topically 2 (two) times daily. (Patient not taking: No sig reported)   . oxyCODONE (OXY IR/ROXICODONE) 5 MG immediate release tablet Take 1 tablet (5 mg total) by mouth every 4 (four) hours as needed for up to 7 days for moderate pain.   . polyethylene glycol (MIRALAX / GLYCOLAX) 17 g packet Take 17 g by mouth daily as needed.   . rosuvastatin (CRESTOR) 20 MG tablet Take 20 mg by mouth daily.   (Patient not taking: Reported on 11/19/2019) 10/11/2015: On hold- because cholesterol has been doing good   No facility-administered encounter medications on file as of 03/15/2020.     SIGNIFICANT DIAGNOSTIC EXAMS  TODAY  03-07-20: left hip x-ray:  1. Comminuted left femoral neck fracture, extending from the mid left femoral neck through the greater trochanter. Significant impaction and varus angulation.  LABS REVIEWED TODAY  03-07-20: wbc 8.5; hgb 13.0; hct 39.9; mcv 96.4 plt 183; glucose 108; bun 30; creat 1.33; k+4.0; na++ 142; ca 8.9 GFR 51 03-08-20: wbc 10.5; hgb 12.2; hct 36.4 mcv 95.5 plt 177; glucose 116; bun 27; creat 1.13; k+ 4.1; na++ 140; ca 8.6  GFR>60; liver normal albumin 3.7 03-10-20; wbc 10.0; hgb 8.9; hct 27.2; mcv 97.1 plt 129; glucose 101; bun 40; creat 2.00; k+ 4.1; na++ 135; ca 7.6; GFR 31 03-11-20: glucose 105; bun 41; creat 1.84; k+ 3.5; na++ 137; ca 7.6 GFR 34; phos 3.0 albumin 2.7    Review of Systems  Constitutional: Negative for malaise/fatigue.  Respiratory: Negative for cough and shortness of breath.   Cardiovascular: Negative for chest pain, palpitations and leg swelling.  Gastrointestinal: Negative for abdominal pain, constipation and heartburn.  Musculoskeletal: Positive for joint pain and myalgias. Negative for back pain.       Left leg spasms at night   Skin: Negative.   Neurological: Negative for dizziness.  Psychiatric/Behavioral: The  patient is not nervous/anxious.   '  Physical Exam Constitutional:      General: He is not in acute distress.    Appearance: He is well-developed and well-nourished. He is not diaphoretic.     Comments: thin  Neck:     Thyroid: No thyromegaly.  Cardiovascular:     Rate and Rhythm: Normal rate and regular rhythm.     Pulses: Normal pulses and intact distal pulses.     Heart sounds: Normal heart sounds.  Pulmonary:     Effort: Pulmonary effort is normal. No respiratory distress.     Breath sounds: Normal breath sounds.  Abdominal:     General: Bowel sounds are normal. There is no distension.     Palpations: Abdomen is soft.     Tenderness: There is no abdominal tenderness.  Musculoskeletal:        General: No edema.     Cervical back: Neck supple.     Right lower leg: No edema.     Left lower leg: No edema.     Comments: Is able to move all extremities Status post left hip arthroplasty 03-08-20  Lymphadenopathy:     Cervical: No cervical adenopathy.  Skin:    General: Skin is warm and dry.  Neurological:     Mental Status: He is alert and oriented to person, place, and time.     Comments: Does have some mild confusion present   Psychiatric:        Mood and  Affect: Mood and affect and mood normal.       ASSESSMENT/ PLAN:  TODAY  1. Closed displaced midcervical fracture of left femur sequela: is stable will stop oxycodone and will start tylenol cr 650 mg every 6 hours as needed; takes flexeril 5 mg nightly asa 81 mg twice daily   will continue therapy as directed to improve upon his level of independence with his adls. Will follow up with orthopedics.   2. Stage 3 chronic kidney disease unspecified whether 3a or 3b: is without change. Bun 41; creat 1.84 down from 2.0; will follow up labs.   3. Mixed hyperlipidemia: is stable will continue crestor 10 mg daily   4. 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  Chronic constipation: is stable will continue miralax daily as needed  6. Benign essential hypertension: is stable b/p 126/64 will continue clonidine 0.1 mg patch weekly   7. Thrombocytopenia: is without change plt 129 will monitor  8. Acute blood anemia: is stable hgb 8.9 will monitor   9. Protein calorie malnutrition; severe; is without change albumin 2.7 will begin prostat twice daily    Will check cbc bmp    Ok Edwards NP [facility]  Contact [phone] Monday through Friday 8am- 5pm  After hours call [phone]

## 2020-03-16 ENCOUNTER — Non-Acute Institutional Stay (SKILLED_NURSING_FACILITY): Payer: Medicare Other | Admitting: Internal Medicine

## 2020-03-16 ENCOUNTER — Encounter: Payer: Self-pay | Admitting: Internal Medicine

## 2020-03-16 DIAGNOSIS — N1832 Chronic kidney disease, stage 3b: Secondary | ICD-10-CM

## 2020-03-16 DIAGNOSIS — E46 Unspecified protein-calorie malnutrition: Secondary | ICD-10-CM | POA: Insufficient documentation

## 2020-03-16 DIAGNOSIS — E440 Moderate protein-calorie malnutrition: Secondary | ICD-10-CM

## 2020-03-16 DIAGNOSIS — S72032S Displaced midcervical fracture of left femur, sequela: Secondary | ICD-10-CM | POA: Diagnosis not present

## 2020-03-16 DIAGNOSIS — I10 Essential (primary) hypertension: Secondary | ICD-10-CM

## 2020-03-16 DIAGNOSIS — D62 Acute posthemorrhagic anemia: Secondary | ICD-10-CM | POA: Diagnosis not present

## 2020-03-16 DIAGNOSIS — E43 Unspecified severe protein-calorie malnutrition: Secondary | ICD-10-CM | POA: Insufficient documentation

## 2020-03-16 NOTE — Assessment & Plan Note (Signed)
Creatinine 1.33 at admission with subsequent peak at 2.  With hydration value was 1.84 at discharge with a GFR 34 indicating CKD stage IIIb.  Avoid nephrotoxic drugs.

## 2020-03-16 NOTE — Assessment & Plan Note (Signed)
Postop anemia attributed to dilution as well as perioperative blood loss.  No active bleeding dyscrasias.  Monitor H/H.

## 2020-03-16 NOTE — Assessment & Plan Note (Signed)
Current albumin 2.7 and total protein 6.0.  Nutrition consult with supplementation at SNF.

## 2020-03-16 NOTE — Progress Notes (Signed)
NURSING HOME LOCATION:  [facility] NUMBER: 131/P   CODE STATUS:  Full Code  PCP: Celene Squibb, MD   This is a comprehensive admission note to [facility] performed on this date less than 30 days from date of admission. Included are preadmission medical/surgical history; reconciled medication list; family history; social history and comprehensive review of systems.  Corrections and additions to the records were documented. Comprehensive physical exam was also performed. Additionally a clinical summary was entered for each active diagnosis pertinent to this admission in the Problem List to enhance continuity of care.  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  Operative repair was completed on 3/1 by Larena Glassman.  Postop course was complicated by AKI.  Creatinine was 1.33 at admission; although it peaked at 2.0 ;with hydrartion value was 1.84 with GFR 34 @ D/C.Renal ultrasound was unrevealing as was urinalysis.   Additionally anemia was attributed to dilution as well as some component of perioperative blood loss.  Baseline hemoglobin was felt to be 13-14; current hemoglobin is 8.9 without evidence of ongoing bleeding dyscrasias. PT/OT were to be completed at [facility].  He was have partial weightbearing up to 50% on the left lower extremity with no active hip abduction.  Hip abduction pillow while in bed was prescribed. DVT prophylaxis for 6 weeks with ASA twice daily.  Orthopedic follow-up was to be in 2 weeks. After admission to the SNF urine culture was positive on 3/7 for greater than 100,000 colonies of Pseudomonas .He is receiving Cipro.  Past medical and surgical history: Includes essential hypertension, dyslipidemia, seasonal allergies, GERD, and BPH. Surgeries and procedures include prior right hip arthroplasty in 2016, lumbar laminectomy in 2017, ureteral stone extraction,  and umbilical hernia repair.  Social history: Occasional social alcohol intake; former smoker.  Family history: Noncontributory due to advanced age.   Review of systems: He validates that there was no cardiac or neurologic prodrome prior to the fall; he simply lost his balance while ambulating with his walker.  He states that his pain has improved significantly & now is mainly positional.  He also describes chronic apparent right shoulder impingement syndrome.  Prostatism is a chronic problem with a weak urine stream. Staff reports that he does cough with meals.  He validates that he had an epiglottic injury related to a prior intubation.  Constitutional: No fever, significant weight change, fatigue  Eyes: No redness, discharge, pain, vision change ENT/mouth: No nasal congestion, purulent discharge, earache, change in hearing, sore throat  Cardiovascular: No chest pain, palpitations, paroxysmal nocturnal dyspnea, claudication, edema  Respiratory: No sputum production, hemoptysis, DOE, significant snoring, apnea Gastrointestinal: No heartburn,  abdominal pain, nausea /vomiting, rectal bleeding, melena, change in bowels Genitourinary: No dysuria, hematuria, pyuria, incontinence, nocturia Dermatologic: No rash, pruritus, change in appearance of skin Neurologic: No dizziness, headache, syncope, seizures, numbness, tingling Psychiatric: No significant anxiety, depression, insomnia, anorexia Endocrine: No change in hair/skin/nails, excessive thirst, excessive hunger, excessive urination  Hematologic/lymphatic: No significant bruising, lymphadenopathy, abnormal bleeding Allergy/immunology: No itchy/watery eyes, significant sneezing, urticaria, angioedema  Physical exam:  Pertinent or positive findings: He is slightly hoarse; he states that this is new since admission to the SNF and not related to his prior epiglottic injury.  Pattern alopecia is present.  There is a faint scar over the bridge of the  nose.  There is subtle drooping of the mouth on the right.  Heart sounds are somewhat distant and the rhythm is difficult to discern.  Faint systolic murmur is suggested.  He has low-grade expiratory rhonchi.  Abdomen slightly protuberant.  Pedal pulses are decreased.  There is marked stasis hyperpigmentation changes greater in the left shin.  He has scattered eschar and excoriations over the forearms.  He has isolated minor osteoarthritic digit changes.  There is a slight tremor of the left upper extremity.  General appearance: no acute distress, increased work of breathing is present.   Lymphatic: No lymphadenopathy about the head, neck, axilla. Eyes: No conjunctival inflammation or lid edema is present. There is no scleral icterus. Ears:  External ear exam shows no significant lesions or deformities.   Nose:  External nasal examination shows no deformity or inflammation. Nasal mucosa are pink and moist without lesions, exudates Oral exam: Lips and gums are healthy appearing.There is no oropharyngeal erythema or exudate. Neck:  No thyromegaly, masses, tenderness noted.    Heart:  No gallop, click, rub.  Lungs:  without wheezes, rales, rubs. Abdomen: Bowel sounds are normal.  Abdomen is soft and nontender with no organomegaly, hernias, masses. GU: Deferred  Extremities:  No cyanosis, clubbing, edema. Neurologic exam:  Balance, Rhomberg, finger to nose testing could not be completed due to clinical state Skin: Warm & dry w/o tenting.  See clinical summary under each active problem in the Problem List with associated updated therapeutic plan

## 2020-03-16 NOTE — Assessment & Plan Note (Signed)
PT/OT at SNF.  Orthopedic follow-up in 2 weeks.  81 mg aspirin twice daily x6 weeks as DVT prophylaxis.  Partial weightbearing up to 50% on LLE with no active hip abduction.  Hip abduction pillow while in bed.

## 2020-03-16 NOTE — Assessment & Plan Note (Addendum)
Present blood pressure is actually soft.  Meds will be adjusted if this is a baseline, especially if symptomatic.

## 2020-03-16 NOTE — Patient Instructions (Signed)
See assessment and plan under each diagnosis in the problem list and acutely for this visit 

## 2020-03-17 ENCOUNTER — Other Ambulatory Visit (HOSPITAL_COMMUNITY)
Admission: RE | Admit: 2020-03-17 | Discharge: 2020-03-17 | Disposition: A | Discharge status: Home / Self Care | Payer: Medicare Other | Source: Skilled Nursing Facility | Attending: Adult Health | Admitting: Adult Health

## 2020-03-17 DIAGNOSIS — N1831 Chronic kidney disease, stage 3a: Secondary | ICD-10-CM | POA: Insufficient documentation

## 2020-03-17 LAB — CBC
HCT: 28.7 % — ABNORMAL LOW (ref 39.0–52.0)
Hemoglobin: 9.5 g/dL — ABNORMAL LOW (ref 13.0–17.0)
MCH: 31.7 pg (ref 26.0–34.0)
MCHC: 33.1 g/dL (ref 30.0–36.0)
MCV: 95.7 fL (ref 80.0–100.0)
Platelets: 350 10*3/uL (ref 150–400)
RBC: 3 MIL/uL — ABNORMAL LOW (ref 4.22–5.81)
RDW: 12.1 % (ref 11.5–15.5)
WBC: 8.5 10*3/uL (ref 4.0–10.5)
nRBC: 0 % (ref 0.0–0.2)

## 2020-03-17 LAB — BASIC METABOLIC PANEL
Anion gap: 10 (ref 5–15)
BUN: 37 mg/dL — ABNORMAL HIGH (ref 8–23)
CO2: 27 mmol/L (ref 22–32)
Calcium: 7.9 mg/dL — ABNORMAL LOW (ref 8.9–10.3)
Chloride: 103 mmol/L (ref 98–111)
Creatinine, Ser: 1.19 mg/dL (ref 0.61–1.24)
GFR, Estimated: 58 mL/min — ABNORMAL LOW (ref >60)
Glucose, Bld: 88 mg/dL (ref 70–99)
Potassium: 3.4 mmol/L — ABNORMAL LOW (ref 3.5–5.1)
Sodium: 140 mmol/L (ref 135–145)

## 2020-03-17 LAB — URINE CULTURE: Culture: >=100000 — AB

## 2020-03-22 ENCOUNTER — Inpatient Hospital Stay: Payer: Medicare Other

## 2020-03-22 ENCOUNTER — Ambulatory Visit (INDEPENDENT_AMBULATORY_CARE_PROVIDER_SITE_OTHER): Payer: Medicare Other | Admitting: Orthopedic Surgery

## 2020-03-22 ENCOUNTER — Encounter: Payer: Self-pay | Admitting: Orthopedic Surgery

## 2020-03-22 VITALS — Ht 72.0 in

## 2020-03-22 DIAGNOSIS — M25511 Pain in right shoulder: Secondary | ICD-10-CM | POA: Diagnosis not present

## 2020-03-22 DIAGNOSIS — W19XXXA Unspecified fall, initial encounter: Secondary | ICD-10-CM | POA: Diagnosis not present

## 2020-03-22 DIAGNOSIS — S72032D Displaced midcervical fracture of left femur, subsequent encounter for closed fracture with routine healing: Secondary | ICD-10-CM | POA: Diagnosis not present

## 2020-03-22 DIAGNOSIS — M549 Dorsalgia, unspecified: Secondary | ICD-10-CM

## 2020-03-22 DIAGNOSIS — Z96642 Presence of left artificial hip joint: Secondary | ICD-10-CM

## 2020-03-22 NOTE — Progress Notes (Signed)
Orthopaedic Postop Note  Assessment: Dominic Hodges is a 87 y.o. male s/p left hip hemiarthroplasty with tension band construct of a greater trochanter fracture Also complaining of right shoulder pain, with evidence of glenohumeral arthritis. Mid back pain  DOS: 03/08/2020  Plan: Sutures were trimmed and Steri-Strips were placed.  Overall, his left hip is healing well.  There may be a small amount of migration of the K wire and cerclage wire, but the greater trochanter fracture remains well aligned.  He sustained a fall while coming in the clinic today, but x-rays of his thoracic spine are negative.  He does have a couple of superficial abrasions, which were covered with a dry dressing.  He also has a history of right shoulder pain, and has previously been evaluated by Dr. Aline Brochure.  He has had good success with a steroid injection in the past, and he elected proceed.  This was done in clinic today.  Regarding his left hip, I would like for him to remain approximately 50% weightbearing for the next 2 weeks, then he can progress to weightbearing as tolerated.  He should also continue to limit his active hip abduction, once again for the next 2 weeks.  Continue to use the hip abduction pillow while in bed until 6 weeks postop.  Continue posterior hip precautions.  Follow-up in 4 weeks with repeat x-rays of his left hip and AP pelvis  Procedure note injection - Right Shoulder joint (subacromial space)   Verbal consent was obtained to inject the Right Shoulder joint  Timeout was completed to confirm the site of injection.  The skin was prepped with alcohol and ethyl chloride was sprayed at the injection site.  A 21-gauge needle was used to inject 6 mg of Betamethasone and 1% lidocaine (3 cc) into the Right Shoulder using an Posterolateral approach.  There were no complications. A sterile bandage was applied.  Follow-up: No follow-ups on file. XR at next visit: AP pelvis and left  hip  Subjective:  Chief Complaint  Patient presents with  . Hip Injury    S/p 03/08/20 hip replacement     History of Present Illness: Dominic Hodges is a 87 y.o. male who presents following the above stated procedure.  Overall he has done well.  He was discharged from the hospital, and remains in a skilled nursing facility.  His pain is been well controlled.  As he was getting to clinic today, he did fall, and has some mid back pain.  He is also been complaining of some right shoulder pain.  He is previously seen Dr. Aline Brochure, and had an injection.  They had discussed an MRI, but this is not completed due to the pandemic.  He has no numbness or tingling in his left lower extremity.  He feels well otherwise.  Review of Systems: No fevers or chills No numbness or tingling No Chest Pain No shortness of breath   Objective: Ht 6' (1.829 m)   BMI 21.51 kg/m   Physical Exam:  Elderly male, seated in a wheelchair.  No acute distress.  Evaluation of the left hip incision demonstrates it is healing well.  There is no surrounding erythema or drainage.  Immediately surrounding the incision, this area is firm to touch, but is not tender.  He tolerates gentle range of motion of his left hip.  Active motion is intact in the TA and EHL.  Over his mid back area, has 2 small, superficial abrasions.  Evaluation of  the right shoulder demonstrates significant atrophy.  He has limited range of motion, but does tolerate forward flexion to greater than 120 degrees.  Abduction to 90 degrees.  External rotation at the side is limited to 30 degrees.  IMAGING: I personally ordered and reviewed the following images:  X-ray of the left hip demonstrates a well-positioned left hip hemiarthroplasty without evidence of hardware failure or loosening.  There is no subsidence of the hardware.  The greater trochanter fracture remains in good position.  Based on today's x-rays, there does appear to be a small  amount of migration of the K wire and cerclage wire.  However, they remain intact, and maintaining a good reduction.  Impression: Well-positioned cemented left hip arthroplasty with greater trochanter fracture in good alignment.   Mordecai Rasmussen, MD 03/22/2020 10:16 PM

## 2020-03-23 ENCOUNTER — Non-Acute Institutional Stay (SKILLED_NURSING_FACILITY): Payer: Medicare Other | Admitting: Adult Health

## 2020-03-23 ENCOUNTER — Other Ambulatory Visit: Payer: Self-pay | Admitting: *Deleted

## 2020-03-23 ENCOUNTER — Encounter: Payer: Self-pay | Admitting: Adult Health

## 2020-03-23 DIAGNOSIS — S72032S Displaced midcervical fracture of left femur, sequela: Secondary | ICD-10-CM

## 2020-03-23 DIAGNOSIS — E440 Moderate protein-calorie malnutrition: Secondary | ICD-10-CM

## 2020-03-23 DIAGNOSIS — N1832 Chronic kidney disease, stage 3b: Secondary | ICD-10-CM

## 2020-03-23 NOTE — Patient Outreach (Signed)
Member screened for potential [facility] Care Management needs.  Mr. Wende Neighbors is residing at [facility].  Update received from [facility] indicating transition plan is for member to return home with spouse.   Will continue to follow and plan outreach as appropriate to discuss [facility] follow up post SNF.    Marthenia Rolling, MSN, RN,BSN [facility] Acute Care Coordinator [phone] [facility]) [phone]  (Toll free office)

## 2020-03-23 NOTE — Progress Notes (Signed)
Location:  [place] Room Number: 956 Place of Service:  SNF (31)   CODE STATUS: full code   Allergies  Allergen Reactions  . Robaxin [Methocarbamol] Other (See Comments)    Mental confusion    Chief Complaint  Patient presents with  . Acute Visit    Care plan meeting     HPI:  We have come together for his care plan meeting. Family present BIMS 9/15 mood 2/30. He has a poor appetite feeds himself. Her requires extensive assist with his adls; he is frequently incontinent of bladder and bowel. He has had one fall with abrasions was in transport [place]. He uses abduction pillow in bed; has been treated for UTI. He continues to participate in therapy pt/ot/st. Contact guard with stand and bed mobility; ambulate 15 feet with walker and min assist ; upper min and  lower max assist. Cognitive testing done [facility] 15/30.   Weight 151 pounds down 12 pounds has had supplements added to his diet. Will remove salt restriction.  no reports of uncontrolled pain.   Past Medical History:  Diagnosis Date  . Acid reflux    mild-states if doesnt chew food completely he begins to cough- states has weakness in muscles due to ilieus- has been doing exreci this  . Adynamic ileus ([place]) 12/2014   ? anesthesia related per pt  . BPH (benign prostatic hyperplasia)   . Complication of anesthesia    10/11/15- per pt "had [place]- in 11/16-related it to possibly anesthesia"  . H/O seasonal allergies   . Hyperlipidemia   . Hypertension     Past Surgical History:  Procedure Laterality Date  . APPENDECTOMY     approx 30 years ago/lwb  . CATARACT EXTRACTION W/PHACO Left 09/11/2012   Procedure: CATARACT EXTRACTION PHACO AND INTRAOCULAR LENS PLACEMENT (IOC);  Surgeon: Tonny Branch, MD;  [facility]: [facility];  Service: Ophthalmology;  Laterality: Left;  CDE: 19.52  . HIP ARTHROPLASTY Right 12/06/2014   Procedure: PARTIAL RIGHT HIP REPLACEMENT;  Surgeon: Carole Civil, MD;  Location: [facility];  Service: Orthopedics;  Laterality: Right;  . HIP ARTHROPLASTY Left 03/08/2020   Procedure: ARTHROPLASTY BIPOLAR HIP (HEMIARTHROPLASTY);  Surgeon: Mordecai Rasmussen, MD;  Location: [facility];  Service: Orthopedics;  Laterality: Left;  . LUMBAR LAMINECTOMY/DECOMPRESSION MICRODISCECTOMY N/A 10/19/2015   Procedure: CENTRAL DECOMPRESSION LUMBAR LAMINECTOMY FOR SPINAL STENOSIS, L3,L4FORAMINOTOMY BILATERAL L3,L4 NERVE ROOT;  Surgeon: Latanya Maudlin, MD;  Location: [facility];  Service: Orthopedics;  Laterality: N/A;  . UMBILICAL HERNIA REPAIR     Approx 10 years ago/lwb  . ureteral stone extraction      Social History   Socioeconomic History  . Marital status: Married    Spouse name: Not on file  . Number of children: Not on file  . Years of education: Not on file  . Highest education level: Not on file  Occupational History  . Not on file  Tobacco Use  . Smoking status: Former Smoker    Types: Pipe  . Smokeless tobacco: Never Used  Substance and Sexual Activity  . Alcohol use: Yes    Comment: occasional social drink/lwb  . Drug use: No  . Sexual activity: Not on file  Other Topics Concern  . Not on file  Social History Narrative  . Not on file   Social Determinants of Health   Financial Resource Strain: Not on file  Food Insecurity: Not on file  Transportation Needs: Not on file  Physical Activity: Not on file  Stress: Not on file  Social Connections: Not on file  Intimate Partner Violence: Not on file   Family History  Problem Relation Age of Onset  . Coronary artery disease Other        no family history of premature CAD      VITAL SIGNS BP (!) 100/50   Pulse 90   Temp 98.5 F (36.9 C)   Resp 18   Ht 6' 0.5" (1.842 m)   Wt 158 lb (71.7 kg)   SpO2 90%   BMI 21.13 kg/m   Outpatient Encounter Medications as of 03/23/2020  Medication Sig  . acetaminophen (TYLENOL) 650 MG CR tablet Take 650 mg by mouth every 6 (six) hours as needed for  pain.  . Amino Acids-Protein Hydrolys (FEEDING SUPPLEMENT, PRO-STAT SUGAR FREE 64,) LIQD Take 30 mLs by mouth 2 (two) times daily.  Marland Kitchen aspirin 81 MG chewable tablet Chew 1 tablet (81 mg total) by mouth 2 (two) times daily.  Roseanne Kaufman Peru-Castor Oil (VENELEX) OINT Apply topically 3 (three) times daily. Special Instructions: Apply to sacrum, coccyx and bilateral buttocks qshift for prevention.  . cholecalciferol (VITAMIN D) 1000 units tablet Take 5,000 Units by mouth daily.  . cloNIDine (CATAPRES - DOSED IN MG/24 HR) 0.1 mg/24hr patch Place 0.1 mg onto the skin once a week. TUESDAY  . cyclobenzaprine (FLEXERIL) 5 MG tablet Take 1 tablet (5 mg total) by mouth at bedtime.  . feeding supplement (ENSURE ENLIVE / ENSURE PLUS) LIQD Take 237 mLs by mouth 2 (two) times daily between meals.  . fluorometholone (FML) 0.1 % ophthalmic suspension Place 1 drop into both eyes 2 (two) times daily.  . NON FORMULARY Diet NAS  . polyethylene glycol (MIRALAX / GLYCOLAX) 17 g packet Take 17 g by mouth daily as needed.  . rosuvastatin (CRESTOR) 20 MG tablet Take 20 mg by mouth daily.   No facility-administered encounter medications on file as of 03/23/2020.     SIGNIFICANT DIAGNOSTIC EXAMS   PREVIOUS   03-07-20: left hip x-ray:  1. Comminuted left femoral neck fracture, extending from the mid left femoral neck through the greater trochanter. Significant impaction and varus angulation.  NO NEW EXAMS.   LABS REVIEWED PREVIOUS   03-07-20: wbc 8.5; hgb 13.0; hct 39.9; mcv 96.4 plt 183; glucose 108; bun 30; creat 1.33; k+4.0; na++ 142; ca 8.9 GFR 51 03-08-20: wbc 10.5; hgb 12.2; hct 36.4 mcv 95.5 plt 177; glucose 116; bun 27; creat 1.13; k+ 4.1; na++ 140; ca 8.6 GFR>60; liver normal albumin 3.7 03-10-20; wbc 10.0; hgb 8.9; hct 27.2; mcv 97.1 plt 129; glucose 101; bun 40; creat 2.00; k+ 4.1; na++ 135; ca 7.6; GFR 31 03-11-20: glucose 105; bun 41; creat 1.84; k+ 3.5; na++ 137; ca 7.6 GFR 34; phos 3.0 albumin 2.7   NO NEW  LABS.   Review of Systems  Constitutional: Negative for malaise/fatigue.  Respiratory: Negative for cough and shortness of breath.   Cardiovascular: Negative for chest pain, palpitations and leg swelling.  Gastrointestinal: Negative for abdominal pain, constipation and heartburn.  Musculoskeletal: Negative for back pain, joint pain and myalgias.  Skin: Negative.   Neurological: Negative for dizziness.  Psychiatric/Behavioral: The patient is not nervous/anxious.     Physical Exam Constitutional:      General: He is not in acute distress.    Appearance: He is well-developed. He is not diaphoretic.     Comments: thin  Neck:     Thyroid: No thyromegaly.  Cardiovascular:     Rate and Rhythm: Normal rate and regular rhythm.     Pulses: Normal pulses.     Heart sounds: Normal heart sounds.  Pulmonary:     Effort: Pulmonary effort is normal. No respiratory distress.     Breath sounds: Normal breath sounds.  Abdominal:     General: Bowel sounds are normal. There is no distension.     Palpations: Abdomen is soft.     Tenderness: There is no abdominal tenderness.  Musculoskeletal:     Cervical back: Neck supple.     Right lower leg: No edema.     Left lower leg: No edema.     Comments:  Is able to move all extremities Status post left hip arthroplasty 03-08-20   Lymphadenopathy:     Cervical: No cervical adenopathy.  Skin:    General: Skin is warm and dry.  Neurological:     Mental Status: He is alert. Mental status is at baseline.  Psychiatric:        Mood and Affect: Mood normal.      ASSESSMENT/ PLAN:  TODAY  1. Closed displaced midcervical fracture of left femur sequela 2. Moderate protein calorie malnutrition 3. CKD stage III  Will continue current medications Will continue current plan of care Will continue therapy as directed The goal of his care is to return back home  Time spent with patient and family: 35 minutes >50% of time spent with coordination and  counseling to include medications; therapy and goals of care.     Ok Edwards NP [facility]  Contact [phone] Monday through Friday 8am- 5pm  After hours call [phone]

## 2020-03-30 ENCOUNTER — Other Ambulatory Visit: Payer: Self-pay | Admitting: *Deleted

## 2020-03-30 NOTE — Patient Outreach (Signed)
[facility] Post- Acute Care Coordinator follow up. Member screened for potential [facility] Care Management needs.  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   Update received from [facility] SW reports member continues to work with therapy. Transition plan for home.  No transition date set yet.   Will continue to follow for potential [facility] Care Management needs while member resides in SNF.    Marthenia Rolling, MSN, RN,BSN [facility] Acute Care Coordinator [phone] [facility]) [phone]  (Toll free office)

## 2020-04-07 ENCOUNTER — Non-Acute Institutional Stay (SKILLED_NURSING_FACILITY): Payer: Medicare Other | Admitting: Adult Health

## 2020-04-07 ENCOUNTER — Encounter: Payer: Self-pay | Admitting: Adult Health

## 2020-04-07 DIAGNOSIS — R627 Adult failure to thrive: Secondary | ICD-10-CM | POA: Insufficient documentation

## 2020-04-07 DIAGNOSIS — R630 Anorexia: Secondary | ICD-10-CM | POA: Insufficient documentation

## 2020-04-07 NOTE — Progress Notes (Signed)
Location:  [place] Room Number: 102 Place of Service:  SNF (31)   CODE STATUS: dnr   Allergies  Allergen Reactions  . Robaxin [Methocarbamol] Other (See Comments)    Mental confusion   Chief Complaint  Patient presents with  . Acute Visit    Weight loss     HPI:  He has been losing weight from his admission weight of 163 pounds to his current weight of 146 pounds. He states that he has no urge to eat. He is denying any insomnia or excessive sleep.  There are no foods that he want to eat   Past Medical History:  Diagnosis Date  . Acid reflux    mild-states if doesnt chew food completely he begins to cough- states has weakness in muscles due to ilieus- has been doing exreci this  . Adynamic ileus ([place]) 12/2014   ? anesthesia related per pt  . BPH (benign prostatic hyperplasia)   . Complication of anesthesia    10/11/15- per pt "had [place]- in 11/16-related it to possibly anesthesia"  . H/O seasonal allergies   . Hyperlipidemia   . Hypertension     Past Surgical History:  Procedure Laterality Date  . APPENDECTOMY     approx 30 years ago/lwb  . CATARACT EXTRACTION W/PHACO Left 09/11/2012   Procedure: CATARACT EXTRACTION PHACO AND INTRAOCULAR LENS PLACEMENT (IOC);  Surgeon: Tonny Branch, MD;  [facility]: [facility];  Service: Ophthalmology;  Laterality: Left;  CDE: 19.52  . HIP ARTHROPLASTY Right 12/06/2014   Procedure: PARTIAL RIGHT HIP REPLACEMENT;  Surgeon: Carole Civil, MD;  Location: [facility];  Service: Orthopedics;  Laterality: Right;  . HIP ARTHROPLASTY Left 03/08/2020   Procedure: ARTHROPLASTY BIPOLAR HIP (HEMIARTHROPLASTY);  Surgeon: Mordecai Rasmussen, MD;  Location: [facility];  Service: Orthopedics;  Laterality: Left;  . LUMBAR LAMINECTOMY/DECOMPRESSION MICRODISCECTOMY N/A 10/19/2015   Procedure: CENTRAL DECOMPRESSION LUMBAR LAMINECTOMY FOR SPINAL STENOSIS, L3,L4FORAMINOTOMY BILATERAL L3,L4 NERVE ROOT;  Surgeon: Latanya Maudlin, MD;  Location: [facility];  Service: Orthopedics;  Laterality: N/A;  . UMBILICAL HERNIA REPAIR     Approx 10 years ago/lwb  . ureteral stone extraction      Social History   Socioeconomic History  . Marital status: Married    Spouse name: Not on file  . Number of children: Not on file  . Years of education: Not on file  . Highest education level: Not on file  Occupational History  . Not on file  Tobacco Use  . Smoking status: Former Smoker    Types: Pipe  . Smokeless tobacco: Never Used  Substance and Sexual Activity  . Alcohol use: Yes    Comment: occasional social drink/lwb  . Drug use: No  . Sexual activity: Not on file  Other Topics Concern  . Not on file  Social History Narrative  . Not on file   Social Determinants of Health   Financial Resource Strain: Not on file  Food Insecurity: Not on file  Transportation Needs: Not on file  Physical Activity: Not on file  Stress: Not on file  Social Connections: Not on file  Intimate Partner Violence: Not on file   Family History  Problem Relation Age of Onset  . Coronary artery disease Other        no family history of premature CAD      VITAL SIGNS BP 134/71   Pulse 76   Temp 98.1 F (36.7 C)   Resp  18   Ht 6' 0.5" (1.842 m)   Wt 146 lb (66.2 kg)   BMI 19.53 kg/m   Outpatient Encounter Medications as of 04/07/2020  Medication Sig  . acetaminophen (TYLENOL) 650 MG CR tablet Take 650 mg by mouth every 6 (six) hours as needed for pain.  . Amino Acids-Protein Hydrolys (FEEDING SUPPLEMENT, PRO-STAT SUGAR FREE 64,) LIQD Take 30 mLs by mouth 2 (two) times daily.  Marland Kitchen aspirin 81 MG chewable tablet Chew 1 tablet (81 mg total) by mouth 2 (two) times daily.  Roseanne Kaufman Peru-Castor Oil (VENELEX) OINT Apply topically 3 (three) times daily. Special Instructions: Apply to sacrum, coccyx and bilateral buttocks qshift for prevention.  . cholecalciferol (VITAMIN D) 1000 units tablet Take 5,000 Units by mouth daily.  .  cloNIDine (CATAPRES - DOSED IN MG/24 HR) 0.1 mg/24hr patch Place 0.1 mg onto the skin once a week. TUESDAY  . cyclobenzaprine (FLEXERIL) 5 MG tablet Take 1 tablet (5 mg total) by mouth at bedtime.  . feeding supplement (ENSURE ENLIVE / ENSURE PLUS) LIQD Take 237 mLs by mouth 2 (two) times daily between meals.  . fluorometholone (FML) 0.1 % ophthalmic suspension Place 1 drop into both eyes 2 (two) times daily.  . NON FORMULARY Diet NAS  . polyethylene glycol (MIRALAX / GLYCOLAX) 17 g packet Take 17 g by mouth daily as needed.  . rosuvastatin (CRESTOR) 20 MG tablet Take 20 mg by mouth daily.   No facility-administered encounter medications on file as of 04/07/2020.     SIGNIFICANT DIAGNOSTIC EXAMS   PREVIOUS   03-07-20: left hip x-ray:  1. Comminuted left femoral neck fracture, extending from the mid left femoral neck through the greater trochanter. Significant impaction and varus angulation.  NO NEW EXAMS.   LABS REVIEWED PREVIOUS   03-07-20: wbc 8.5; hgb 13.0; hct 39.9; mcv 96.4 plt 183; glucose 108; bun 30; creat 1.33; k+4.0; na++ 142; ca 8.9 GFR 51 03-08-20: wbc 10.5; hgb 12.2; hct 36.4 mcv 95.5 plt 177; glucose 116; bun 27; creat 1.13; k+ 4.1; na++ 140; ca 8.6 GFR>60; liver normal albumin 3.7 03-10-20; wbc 10.0; hgb 8.9; hct 27.2; mcv 97.1 plt 129; glucose 101; bun 40; creat 2.00; k+ 4.1; na++ 135; ca 7.6; GFR 31 03-11-20: glucose 105; bun 41; creat 1.84; k+ 3.5; na++ 137; ca 7.6 GFR 34; phos 3.0 albumin 2.7   NO NEW LABS.   Review of Systems  Constitutional: Negative for malaise/fatigue.       No appetite   Respiratory: Negative for cough and shortness of breath.   Cardiovascular: Negative for chest pain, palpitations and leg swelling.  Gastrointestinal: Negative for abdominal pain, constipation and heartburn.  Musculoskeletal: Negative for back pain, joint pain and myalgias.  Skin: Negative.   Neurological: Negative for dizziness.  Psychiatric/Behavioral: The patient is not  nervous/anxious.     Physical Exam Constitutional:      General: He is not in acute distress.    Appearance: He is underweight. He is not diaphoretic.  Neck:     Thyroid: No thyromegaly.  Cardiovascular:     Rate and Rhythm: Normal rate and regular rhythm.     Pulses: Normal pulses.     Heart sounds: Normal heart sounds.  Pulmonary:     Effort: Pulmonary effort is normal. No respiratory distress.     Breath sounds: Normal breath sounds.  Abdominal:     General: Bowel sounds are normal. There is no distension.     Palpations: Abdomen is soft.  Tenderness: There is no abdominal tenderness.  Musculoskeletal:     Cervical back: Neck supple.     Right lower leg: No edema.     Left lower leg: No edema.     Comments:  Is able to move all extremities Status post left hip arthroplasty 03-08-20    Lymphadenopathy:     Cervical: No cervical adenopathy.  Skin:    General: Skin is warm and dry.  Neurological:     Mental Status: He is alert. Mental status is at baseline.  Psychiatric:        Mood and Affect: Mood normal.      ASSESSMENT/ PLAN:  TODAY  1. Anorexia 2. Failure to thrive in adult.   Will begin remeron 7.5 mg nightly for 30 days to help stimulate his appetite  Will monitor his status.     Ok Edwards NP [facility]  Contact [phone] Monday through Friday 8am- 5pm  After hours call [phone]

## 2020-04-12 ENCOUNTER — Non-Acute Institutional Stay (SKILLED_NURSING_FACILITY): Payer: Medicare Other | Admitting: Adult Health

## 2020-04-12 ENCOUNTER — Encounter: Payer: Self-pay | Admitting: Adult Health

## 2020-04-12 DIAGNOSIS — R059 Cough, unspecified: Secondary | ICD-10-CM | POA: Diagnosis not present

## 2020-04-12 NOTE — Progress Notes (Signed)
Location:  [place] Room Number: 086 Place of Service:  SNF (31)   CODE STATUS: dnr  Allergies  Allergen Reactions  . Robaxin [Methocarbamol] Other (See Comments)    Mental confusion    Chief Complaint  Patient presents with  . Acute Visit    cough    HPI:  His family is worried about his cough. He tells me that he has clear sputum and has had the cough "for a long time". He denies any sore throat; no sinus congestion; no fevers; no shortness of breath and no wheezing he was started on robitussin by standing orders which did help somewhat with his cough.   Past Medical History:  Diagnosis Date  . Acid reflux    mild-states if doesnt chew food completely he begins to cough- states has weakness in muscles due to ilieus- has been doing exreci this  . Adynamic ileus ([place]) 12/2014   ? anesthesia related per pt  . BPH (benign prostatic hyperplasia)   . Complication of anesthesia    10/11/15- per pt "had [place]- in 11/16-related it to possibly anesthesia"  . H/O seasonal allergies   . Hyperlipidemia   . Hypertension     Past Surgical History:  Procedure Laterality Date  . APPENDECTOMY     approx 30 years ago/lwb  . CATARACT EXTRACTION W/PHACO Left 09/11/2012   Procedure: CATARACT EXTRACTION PHACO AND INTRAOCULAR LENS PLACEMENT (IOC);  Surgeon: Tonny Branch, MD;  Location: [facility];  Service: Ophthalmology;  Laterality: Left;  CDE: 19.52  . HIP ARTHROPLASTY Right 12/06/2014   Procedure: PARTIAL RIGHT HIP REPLACEMENT;  Surgeon: Carole Civil, MD;  Location: [facility];  Service: Orthopedics;  Laterality: Right;  . HIP ARTHROPLASTY Left 03/08/2020   Procedure: ARTHROPLASTY BIPOLAR HIP (HEMIARTHROPLASTY);  Surgeon: Mordecai Rasmussen, MD;  Location: [facility];  Service: Orthopedics;  Laterality: Left;  . LUMBAR LAMINECTOMY/DECOMPRESSION MICRODISCECTOMY N/A 10/19/2015   Procedure: CENTRAL DECOMPRESSION LUMBAR LAMINECTOMY FOR SPINAL  STENOSIS, L3,L4FORAMINOTOMY BILATERAL L3,L4 NERVE ROOT;  Surgeon: Latanya Maudlin, MD;  Location: [facility];  Service: Orthopedics;  Laterality: N/A;  . UMBILICAL HERNIA REPAIR     Approx 10 years ago/lwb  . ureteral stone extraction      Social History   Socioeconomic History  . Marital status: Married    Spouse name: Not on file  . Number of children: Not on file  . Years of education: Not on file  . Highest education level: Not on file  Occupational History  . Not on file  Tobacco Use  . Smoking status: Former Smoker    Types: Pipe  . Smokeless tobacco: Never Used  Substance and Sexual Activity  . Alcohol use: Yes    Comment: occasional social drink/lwb  . Drug use: No  . Sexual activity: Not on file  Other Topics Concern  . Not on file  Social History Narrative  . Not on file   Social Determinants of Health   Financial Resource Strain: Not on file  Food Insecurity: Not on file  Transportation Needs: Not on file  Physical Activity: Not on file  Stress: Not on file  Social Connections: Not on file  Intimate Partner Violence: Not on file   Family History  Problem Relation Age of Onset  . Coronary artery disease Other        no family history of premature CAD      VITAL SIGNS BP 126/75   Pulse 70  Temp 98.3 F (36.8 C)   Resp 20   Ht 6' 0.5" (1.842 m)   Wt 140 lb 3.2 oz (63.6 kg)   BMI 18.75 kg/m   Outpatient Encounter Medications as of 04/12/2020  Medication Sig  . mirtazapine (REMERON) 7.5 MG tablet Take 7.5 mg by mouth at bedtime.  Marland Kitchen acetaminophen (TYLENOL) 650 MG CR tablet Take 650 mg by mouth every 6 (six) hours as needed for pain.  . Amino Acids-Protein Hydrolys (FEEDING SUPPLEMENT, PRO-STAT SUGAR FREE 64,) LIQD Take 30 mLs by mouth 2 (two) times daily.  Marland Kitchen aspirin 81 MG chewable tablet Chew 1 tablet (81 mg total) by mouth 2 (two) times daily.  Roseanne Kaufman Peru-Castor Oil (VENELEX) OINT Apply topically 3 (three) times daily. Special Instructions: Apply to  sacrum, coccyx and bilateral buttocks qshift for prevention.  . cholecalciferol (VITAMIN D) 1000 units tablet Take 5,000 Units by mouth daily.  . cloNIDine (CATAPRES - DOSED IN MG/24 HR) 0.1 mg/24hr patch Place 0.1 mg onto the skin once a week. TUESDAY  . cyclobenzaprine (FLEXERIL) 5 MG tablet Take 1 tablet (5 mg total) by mouth at bedtime.  . feeding supplement (ENSURE ENLIVE / ENSURE PLUS) LIQD Take 237 mLs by mouth 2 (two) times daily between meals.  . fluorometholone (FML) 0.1 % ophthalmic suspension Place 1 drop into both eyes 2 (two) times daily.  . NON FORMULARY Diet NAS  . polyethylene glycol (MIRALAX / GLYCOLAX) 17 g packet Take 17 g by mouth daily as needed.  . rosuvastatin (CRESTOR) 20 MG tablet Take 20 mg by mouth daily.   No facility-administered encounter medications on file as of 04/12/2020.     SIGNIFICANT DIAGNOSTIC EXAMS   PREVIOUS   03-07-20: left hip x-ray:  1. Comminuted left femoral neck fracture, extending from the mid left femoral neck through the greater trochanter. Significant impaction and varus angulation.  NO NEW EXAMS.   LABS REVIEWED PREVIOUS   03-07-20: wbc 8.5; hgb 13.0; hct 39.9; mcv 96.4 plt 183; glucose 108; bun 30; creat 1.33; k+4.0; na++ 142; ca 8.9 GFR 51 03-08-20: wbc 10.5; hgb 12.2; hct 36.4 mcv 95.5 plt 177; glucose 116; bun 27; creat 1.13; k+ 4.1; na++ 140; ca 8.6 GFR>60; liver normal albumin 3.7 03-10-20; wbc 10.0; hgb 8.9; hct 27.2; mcv 97.1 plt 129; glucose 101; bun 40; creat 2.00; k+ 4.1; na++ 135; ca 7.6; GFR 31 03-11-20: glucose 105; bun 41; creat 1.84; k+ 3.5; na++ 137; ca 7.6 GFR 34; phos 3.0 albumin 2.7   NO NEW LABS.   Review of Systems  Constitutional: Negative for malaise/fatigue.  Respiratory: Positive for cough and sputum production. Negative for shortness of breath.   Cardiovascular: Negative for chest pain, palpitations and leg swelling.  Gastrointestinal: Negative for abdominal pain, constipation and heartburn.  Musculoskeletal:  Negative for back pain, joint pain and myalgias.  Skin: Negative.   Neurological: Negative for dizziness.  Psychiatric/Behavioral: The patient is not nervous/anxious.       Physical Exam Constitutional:      General: He is not in acute distress.    Appearance: He is well-developed. He is not diaphoretic.  HENT:     Nose: Nose normal.     Mouth/Throat:     Mouth: Mucous membranes are moist.     Pharynx: Oropharynx is clear.  Eyes:     Conjunctiva/sclera: Conjunctivae normal.  Neck:     Thyroid: No thyromegaly.  Cardiovascular:     Rate and Rhythm: Normal rate and regular rhythm.  Heart sounds: Normal heart sounds.  Pulmonary:     Effort: Pulmonary effort is normal. No respiratory distress.     Breath sounds: Normal breath sounds.  Abdominal:     General: Bowel sounds are normal. There is no distension.     Palpations: Abdomen is soft.     Tenderness: There is no abdominal tenderness.  Musculoskeletal:     Cervical back: Neck supple.     Right lower leg: No edema.     Left lower leg: No edema.     Comments: Is able to move all extremities Status post left hip arthroplasty 03-08-20     Lymphadenopathy:     Cervical: No cervical adenopathy.  Skin:    General: Skin is warm and dry.  Neurological:     Mental Status: He is alert and oriented to person, place, and time.  Psychiatric:        Mood and Affect: Mood normal.       ASSESSMENT/ PLAN:  TODAY  1. Cough: will begin mucinex 600 mg twice daily through 04-22-20 will monitor    Ok Edwards NP [facility]  Contact [phone] Monday through Friday 8am- 5pm  After hours call [phone]

## 2020-04-13 DIAGNOSIS — R053 Chronic cough: Secondary | ICD-10-CM | POA: Insufficient documentation

## 2020-04-13 DIAGNOSIS — R059 Cough, unspecified: Secondary | ICD-10-CM | POA: Insufficient documentation

## 2020-04-14 ENCOUNTER — Non-Acute Institutional Stay (SKILLED_NURSING_FACILITY): Payer: Medicare Other | Admitting: Internal Medicine

## 2020-04-14 ENCOUNTER — Encounter: Payer: Self-pay | Admitting: Internal Medicine

## 2020-04-14 DIAGNOSIS — R634 Abnormal weight loss: Secondary | ICD-10-CM

## 2020-04-14 DIAGNOSIS — R1310 Dysphagia, unspecified: Secondary | ICD-10-CM | POA: Insufficient documentation

## 2020-04-14 DIAGNOSIS — N1831 Chronic kidney disease, stage 3a: Secondary | ICD-10-CM

## 2020-04-14 DIAGNOSIS — R1314 Dysphagia, pharyngoesophageal phase: Secondary | ICD-10-CM

## 2020-04-14 DIAGNOSIS — R1312 Dysphagia, oropharyngeal phase: Secondary | ICD-10-CM | POA: Insufficient documentation

## 2020-04-14 DIAGNOSIS — N400 Enlarged prostate without lower urinary tract symptoms: Secondary | ICD-10-CM

## 2020-04-14 DIAGNOSIS — D649 Anemia, unspecified: Secondary | ICD-10-CM

## 2020-04-14 DIAGNOSIS — Z87898 Personal history of other specified conditions: Secondary | ICD-10-CM | POA: Diagnosis not present

## 2020-04-14 NOTE — Assessment & Plan Note (Addendum)
Speech Therapy monitor will continue at the SNF. I have recommended Pulmonology consult with Dr. Christinia Gully to reassess the larynx and vocal cord function.

## 2020-04-14 NOTE — Progress Notes (Signed)
NURSING HOME LOCATION:  [facility] ROOM NUMBER:  131/P  CODE STATUS:  Full Code  PCP:  Edwinna Areola. [name] MD  This is a nursing facility follow up visit for specific acute issues of dysphagia and significant weight loss.  Interim medical record and care since last SNF visit was updated with review of diagnostic studies and change in clinical status since last visit were documented.  HPI: He is here at the facility for PT/OT following a displaced fracture of the greater trochanter of the left femur. Staff reports weight loss of over 10 pounds in the past 2 weeks. His wife states he has had a poor appetite for approximately 1 year.  There was no particular event or process such as Covid related infection to explain this.   He has had dysphagia and cough since intubation for a laminectomy in 2017.  Apparently the intubation was traumatic and postoperatively he had a swollen epiglottis.  Since that time his wife states that he has had a persistent cough and his speech has been affected. He states that Dr. Luan Pulling subsequently performed bronchoscopy on him but cannot remember the exact date ( I can find no Bronchoscopy note in Epic).  He was told that "my epiglottis and glottis do not close".  He saw a Astronomer after that procedure.   He states that he has frequent dysphagia and cough,especially  with intake of meat or bread.  He has no pill dysphagia.  He compensates by chewing his food "very fine".   Anesthesia record 03/14/2020 was reviewed; he was not intubated for that orthopedic surgery.  He states he does not like the food here @ SNF. The NP initiated Remeron  in attempt to increase his appetite. After his admission here he did have a Pseudomonas UTI for which he received Cipro.  Significant medical history includes essential hypertension, dyslipidemia, nephrolithiasis, BPH, and GERD.  Additionally [name] "has questions about his medications". One unidentified  med is described as having a very bad taste. Staff reports in their estimation that he does not feel comfortable with the opinions and recommendations of male care providers.  Review of systems:  Constitutional: No fever Eyes: No redness, discharge, pain, vision change ENT/mouth: No nasal congestion,  purulent discharge, earache, change in hearing, sore throat  Cardiovascular: No chest pain, palpitations, paroxysmal nocturnal dyspnea, claudication, edema  Respiratory: No hemoptysis,  significant snoring, apnea   Gastrointestinal: No heartburn, abdominal pain, nausea /vomiting, rectal bleeding, melena, change in bowels Dermatologic: No rash, pruritus, change in appearance of skin Neurologic: No dizziness, headache, syncope, seizures, numbness, tingling Psychiatric: No significant anxiety, depression, insomnia Endocrine: No change in hair/skin/nails, excessive thirst, excessive hunger, excessive urination  Hematologic/lymphatic: No significant bruising, lymphadenopathy, abnormal bleeding Allergy/immunology: No itchy/watery eyes, significant sneezing, urticaria, angioedema  Physical exam:  Pertinent or positive findings: He is somewhat hard of hearing.  He is slightly hoarse.  Ectropion is suggested of the left lower lid.  The corner of the mouth sags on the right.  He has a slight gallop cadence with an irregular rhythm & occasional pauses.  Breath sounds are decreased.  Pedal pulses are decreased.  Limbs are atrophic and he has temporal wasting.  Also his ribs are visible posteriorly.  Bruising is noted over the forearms, greater on the right.  General appearance: no acute distress, increased work of breathing is present.   Lymphatic: No lymphadenopathy about the head, neck, axilla. Eyes: No lid edema is present. There is  no scleral icterus. Ears:  External ear exam shows no significant lesions or deformities.   Nose:  External nasal examination shows no deformity or inflammation. Nasal  mucosa are pink and moist without lesions, exudates Oral exam:  Lips and gums are healthy appearing. There is no oropharyngeal erythema or exudate. Neck:  No thyromegaly, masses, tenderness noted.    Heart:  No murmur, click, rub .  Lungs: without wheezes, rhonchi, rales, rubs. Abdomen: Bowel sounds are normal. Abdomen is soft and nontender with no organomegaly, hernias, masses. GU: Deferred  Extremities:  No cyanosis, clubbing, edema  Skin: Warm & dry w/o tenting. No significant rash.  See summary under each active problem in the Problem List with associated updated therapeutic plan

## 2020-04-14 NOTE — Assessment & Plan Note (Signed)
No bleeding dyscrasias reported.  Most likely this is related to his CKD.  Most recent H/H was 9.5/28.7 on 3/10. CBC will be updated.

## 2020-04-14 NOTE — Patient Instructions (Signed)
See assessment and plan under each diagnosis in the problem list and acutely for this visit 

## 2020-04-14 NOTE — Assessment & Plan Note (Signed)
Comprehensive labs should be repeated at this time to rule out progression of his CKD and anemia and also to update his TSH.

## 2020-04-14 NOTE — Assessment & Plan Note (Signed)
Because of the suggestion of some cachexia and significant weight loss; PSA will be updated.

## 2020-04-14 NOTE — Assessment & Plan Note (Signed)
Most recent renal function testing was 3/10 and revealed a creatinine of 1.19 and GFR 58 compatible with CKD stage IIIa.  This should be updated in the context of his significant recent weight loss.

## 2020-04-15 ENCOUNTER — Encounter: Payer: Self-pay | Admitting: Internal Medicine

## 2020-04-15 ENCOUNTER — Other Ambulatory Visit (HOSPITAL_COMMUNITY)
Admission: RE | Admit: 2020-04-15 | Discharge: 2020-04-15 | Disposition: A | Discharge status: Home / Self Care | Payer: Medicare Other | Source: Skilled Nursing Facility | Attending: Adult Health | Admitting: Adult Health

## 2020-04-15 DIAGNOSIS — S72001A Fracture of unspecified part of neck of right femur, initial encounter for closed fracture: Secondary | ICD-10-CM | POA: Diagnosis present

## 2020-04-15 LAB — COMPREHENSIVE METABOLIC PANEL
ALT: 16 U/L (ref 0–44)
AST: 19 U/L (ref 15–41)
Albumin: 3.6 g/dL (ref 3.5–5.0)
Alkaline Phosphatase: 115 U/L (ref 38–126)
Anion gap: 10 (ref 5–15)
BUN: 41 mg/dL — ABNORMAL HIGH (ref 8–23)
CO2: 28 mmol/L (ref 22–32)
Calcium: 8.9 mg/dL (ref 8.9–10.3)
Chloride: 105 mmol/L (ref 98–111)
Creatinine, Ser: 1.17 mg/dL (ref 0.61–1.24)
GFR, Estimated: 59 mL/min — ABNORMAL LOW (ref >60)
Glucose, Bld: 108 mg/dL — ABNORMAL HIGH (ref 70–99)
Potassium: 4.2 mmol/L (ref 3.5–5.1)
Sodium: 143 mmol/L (ref 135–145)
Total Bilirubin: 1 mg/dL (ref 0.3–1.2)
Total Protein: 6.7 g/dL (ref 6.5–8.1)

## 2020-04-15 LAB — CBC
HCT: 41.2 % (ref 39.0–52.0)
Hemoglobin: 13 g/dL (ref 13.0–17.0)
MCH: 29.5 pg (ref 26.0–34.0)
MCHC: 31.6 g/dL (ref 30.0–36.0)
MCV: 93.4 fL (ref 80.0–100.0)
Platelets: 246 10*3/uL (ref 150–400)
RBC: 4.41 MIL/uL (ref 4.22–5.81)
RDW: 14.6 % (ref 11.5–15.5)
WBC: 10 10*3/uL (ref 4.0–10.5)
nRBC: 0 % (ref 0.0–0.2)

## 2020-04-15 NOTE — Assessment & Plan Note (Signed)
Pulmonology evaluation

## 2020-04-18 ENCOUNTER — Encounter: Payer: Self-pay | Admitting: Adult Health

## 2020-04-18 ENCOUNTER — Non-Acute Institutional Stay (SKILLED_NURSING_FACILITY): Payer: Medicare Other | Admitting: Adult Health

## 2020-04-18 DIAGNOSIS — R634 Abnormal weight loss: Secondary | ICD-10-CM

## 2020-04-18 DIAGNOSIS — S72032S Displaced midcervical fracture of left femur, sequela: Secondary | ICD-10-CM

## 2020-04-18 DIAGNOSIS — E43 Unspecified severe protein-calorie malnutrition: Secondary | ICD-10-CM

## 2020-04-18 DIAGNOSIS — N1831 Chronic kidney disease, stage 3a: Secondary | ICD-10-CM

## 2020-04-18 NOTE — Progress Notes (Signed)
Location:  [facility] Room Number: 131/P Place of Service:  SNF (31)   CODE STATUS: Full Code  Allergies  Allergen Reactions  . Robaxin [Methocarbamol] Other (See Comments)    Mental confusion    Chief Complaint  Patient presents with  . Medical Management of Chronic Issues          Closed displaced midcervical fracture of left femur sequela:  Stage 3 chronic kidney disease unspecified whether 3a or 3b:   Mixed hyperlipidemia: Protein calorie malnutrition; severe; unintentional weight loss     HPI:  He is a 87 year old short term rehab patient being seen for the management of his chronic illnesses:  Closed displaced midcervical fracture of left femur sequela:  Stage 3 chronic kidney disease unspecified whether 3a or 3b:   Mixed hyperlipidemia: Protein calorie malnutrition; severe; unintentional weight loss. He continues to lose weight. His appetite remains poor. He continues to participate in therapy. There are no reports of uncontrolled pain.   Past Medical History:  Diagnosis Date  . Acid reflux    mild-states if doesnt chew food completely he begins to cough- states has weakness in muscles due to ilieus- has been doing exreci this  . Adynamic ileus ([place]) 12/2014   ? anesthesia related per pt  . BPH (benign prostatic hyperplasia)   . Complication of anesthesia    10/11/15- per pt "had [place]- in 11/16-related it to possibly anesthesia"  . H/O seasonal allergies   . Hyperlipidemia   . Hypertension     Past Surgical History:  Procedure Laterality Date  . APPENDECTOMY     approx 30 years ago/lwb  . CATARACT EXTRACTION W/PHACO Left 09/11/2012   Procedure: CATARACT EXTRACTION PHACO AND INTRAOCULAR LENS PLACEMENT (IOC);  Surgeon: Tonny Branch, MD;  Location: [facility];  Service: Ophthalmology;  Laterality: Left;  CDE: 19.52  . HIP ARTHROPLASTY Right 12/06/2014   Procedure: PARTIAL RIGHT HIP REPLACEMENT;  Surgeon: Carole Civil, MD;  Location: [facility];  Service: Orthopedics;  Laterality: Right;  . HIP ARTHROPLASTY Left 03/08/2020   Procedure: ARTHROPLASTY BIPOLAR HIP (HEMIARTHROPLASTY);  Surgeon: Mordecai Rasmussen, MD;  Location: [facility];  Service: Orthopedics;  Laterality: Left;  . LUMBAR LAMINECTOMY/DECOMPRESSION MICRODISCECTOMY N/A 10/19/2015   Procedure: CENTRAL DECOMPRESSION LUMBAR LAMINECTOMY FOR SPINAL STENOSIS, L3,L4FORAMINOTOMY BILATERAL L3,L4 NERVE ROOT;  Surgeon: Latanya Maudlin, MD;  Location: [facility];  Service: Orthopedics;  Laterality: N/A;  . UMBILICAL HERNIA REPAIR     Approx 10 years ago/lwb  . ureteral stone extraction      Social History   Socioeconomic History  . Marital status: Married    Spouse name: Not on file  . Number of children: Not on file  . Years of education: Not on file  . Highest education level: Not on file  Occupational History  . Not on file  Tobacco Use  . Smoking status: Former Smoker    Types: Pipe  . Smokeless tobacco: Never Used  Substance and Sexual Activity  . Alcohol use: Yes    Comment: occasional social drink/lwb  . Drug use: No  . Sexual activity: Not on file  Other Topics Concern  . Not on file  Social History Narrative  . Not on file   Social Determinants of Health   Financial Resource Strain: Not on file  Food Insecurity: Not on file  Transportation Needs: Not on file  Physical Activity: Not on file  Stress: Not on file  Social  Connections: Not on file  Intimate Partner Violence: Not on file   Family History  Problem Relation Age of Onset  . Coronary artery disease Other        no family history of premature CAD      VITAL SIGNS BP 133/69   Pulse 77   Temp 98.3 F (36.8 C)   Resp 20   Ht 6' 0.5" (1.842 m)   Wt 140 lb 3.2 oz (63.6 kg)   BMI 18.75 kg/m   Outpatient Encounter Medications as of 04/18/2020  Medication Sig  . acetaminophen (TYLENOL) 650 MG CR tablet Take 650 mg by mouth every 6 (six) hours as needed for pain.  .  Amino Acids-Protein Hydrolys (FEEDING SUPPLEMENT, PRO-STAT SUGAR FREE 64,) LIQD Take 30 mLs by mouth 2 (two) times daily.  Marland Kitchen aspirin 81 MG chewable tablet Chew 1 tablet (81 mg total) by mouth 2 (two) times daily.  Roseanne Kaufman [place]-Castor Oil (VENELEX) OINT Apply topically 3 (three) times daily. Special Instructions: Apply to sacrum, coccyx and bilateral buttocks qshift for prevention.  . Cholecalciferol (VITAMIN D) 125 MCG (5000 UT) CAPS Take 1 capsule by mouth daily in the afternoon.  . cloNIDine (CATAPRES - DOSED IN MG/24 HR) 0.1 mg/24hr patch Place 0.1 mg onto the skin once a week. TUESDAY  . feeding supplement (ENSURE ENLIVE / ENSURE PLUS) LIQD Take 237 mLs by mouth 2 (two) times daily between meals.  . fluorometholone (FML) 0.1 % ophthalmic suspension Place 1 drop into both eyes 2 (two) times daily.  Marland Kitchen guaiFENesin (MUCINEX) 600 MG 12 hr tablet Take 600 mg by mouth 2 (two) times daily.  . NON FORMULARY Diet Regular  . polyethylene glycol (MIRALAX / GLYCOLAX) 17 g packet Take 17 g by mouth daily as needed.  . rosuvastatin (CRESTOR) 20 MG tablet Take 20 mg by mouth daily.  . [DISCONTINUED] cholecalciferol (VITAMIN D) 1000 units tablet Take 5,000 Units by mouth daily.   No facility-administered encounter medications on file as of 04/18/2020.     SIGNIFICANT DIAGNOSTIC EXAMS   PREVIOUS   03-07-20: left hip x-ray:  1. Comminuted left femoral neck fracture, extending from the mid left femoral neck through the greater trochanter. Significant impaction and varus angulation.  NO NEW EXAMS.   LABS REVIEWED PREVIOUS   03-07-20: wbc 8.5; hgb 13.0; hct 39.9; mcv 96.4 plt 183; glucose 108; bun 30; creat 1.33; k+4.0; na++ 142; ca 8.9 GFR 51 03-08-20: wbc 10.5; hgb 12.2; hct 36.4 mcv 95.5 plt 177; glucose 116; bun 27; creat 1.13; k+ 4.1; na++ 140; ca 8.6 GFR>60; liver normal albumin 3.7 03-10-20; wbc 10.0; hgb 8.9; hct 27.2; mcv 97.1 plt 129; glucose 101; bun 40; creat 2.00; k+ 4.1; na++ 135; ca 7.6; GFR  31 03-11-20: glucose 105; bun 41; creat 1.84; k+ 3.5; na++ 137; ca 7.6 GFR 34; phos 3.0 albumin 2.7   TODAY   04-15-20: wbc 10.0; hgb 13.0; hct 41.2; mcv 93.9 plt 246; glucose 108; bun 41; creat 1.17; k+ 4.2; na++ 143; ca 8.9 GFR59; liver normal albumin 3.6    Review of Systems  Constitutional: Negative for malaise/fatigue.  Respiratory: Negative for cough and shortness of breath.   Cardiovascular: Negative for chest pain, palpitations and leg swelling.  Gastrointestinal: Negative for abdominal pain, constipation and heartburn.  Musculoskeletal: Negative for back pain, joint pain and myalgias.  Skin: Negative.   Neurological: Negative for dizziness.  Psychiatric/Behavioral: The patient is not nervous/anxious.     Physical Exam Constitutional:  General: He is not in acute distress.    Appearance: He is underweight. He is not diaphoretic.  Neck:     Thyroid: No thyromegaly.  Cardiovascular:     Rate and Rhythm: Normal rate and regular rhythm.     Pulses: Normal pulses.     Heart sounds: Normal heart sounds.  Pulmonary:     Effort: Pulmonary effort is normal. No respiratory distress.     Breath sounds: Normal breath sounds.  Abdominal:     General: Bowel sounds are normal. There is no distension.     Palpations: Abdomen is soft.     Tenderness: There is no abdominal tenderness.  Musculoskeletal:     Cervical back: Neck supple.     Right lower leg: No edema.     Left lower leg: No edema.     Comments:  Is able to move all extremities Status post left hip arthroplasty 03-08-20      Lymphadenopathy:     Cervical: No cervical adenopathy.  Skin:    General: Skin is warm and dry.  Neurological:     Mental Status: He is alert. Mental status is at baseline.  Psychiatric:        Mood and Affect: Mood normal.       ASSESSMENT/ PLAN:   TODAY  1. Closed displaced midcervical fracture of left femur sequela: is stable will continue  tylenol cr 650 mg every 6 hours as  needed;asa 81 mg twice daily   will continue therapy as directed to improve upon his level of independence with his adls. Will follow up with orthopedics.   2. Stage 3 chronic kidney disease unspecified whether 3a or 3b: is without change. Bun 41; creat 1.84 down from 2.0; will follow up labs.   3. Mixed hyperlipidemia: is stable will continue crestor 10 mg daily   4. Protein calorie malnutrition; severe; unintentional weight loss: he continues to lose weight: will continue supplements and prostat as ordered. Will check cbc; cmp; vitamin B12; folate; tsh.   PREVIOUS   5. 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  Chronic constipation: is stable will continue miralax daily as needed  7. Benign essential hypertension: is stable b/p 133/69 will continue clonidine 0.1 mg patch weekly   8. Thrombocytopenia: is without change plt 246 will monitor  9. Acute blood anemia: is stable hgb 13.0 will monitor        Ok Edwards NP [facility]  Contact [phone] Monday through Friday 8am- 5pm  After hours call [phone]

## 2020-04-19 ENCOUNTER — Non-Acute Institutional Stay (SKILLED_NURSING_FACILITY): Payer: Medicare Other | Admitting: Adult Health

## 2020-04-19 ENCOUNTER — Encounter: Payer: Self-pay | Admitting: Adult Health

## 2020-04-19 ENCOUNTER — Inpatient Hospital Stay: Payer: Medicare Other

## 2020-04-19 ENCOUNTER — Encounter: Payer: Self-pay | Admitting: Orthopedic Surgery

## 2020-04-19 ENCOUNTER — Ambulatory Visit (INDEPENDENT_AMBULATORY_CARE_PROVIDER_SITE_OTHER): Payer: Medicare Other | Admitting: Orthopedic Surgery

## 2020-04-19 VITALS — Ht 72.5 in | Wt 140.0 lb

## 2020-04-19 DIAGNOSIS — R634 Abnormal weight loss: Secondary | ICD-10-CM

## 2020-04-19 DIAGNOSIS — N1831 Chronic kidney disease, stage 3a: Secondary | ICD-10-CM | POA: Diagnosis not present

## 2020-04-19 DIAGNOSIS — G8929 Other chronic pain: Secondary | ICD-10-CM | POA: Diagnosis not present

## 2020-04-19 DIAGNOSIS — M25511 Pain in right shoulder: Secondary | ICD-10-CM

## 2020-04-19 DIAGNOSIS — S72032S Displaced midcervical fracture of left femur, sequela: Secondary | ICD-10-CM

## 2020-04-19 DIAGNOSIS — Z96642 Presence of left artificial hip joint: Secondary | ICD-10-CM

## 2020-04-19 DIAGNOSIS — S72032D Displaced midcervical fracture of left femur, subsequent encounter for closed fracture with routine healing: Secondary | ICD-10-CM

## 2020-04-19 DIAGNOSIS — R1314 Dysphagia, pharyngoesophageal phase: Secondary | ICD-10-CM | POA: Diagnosis not present

## 2020-04-19 NOTE — Progress Notes (Signed)
Orthopaedic Postop Note  Assessment: Dominic Hodges is a 87 y.o. male s/p left hip hemiarthroplasty with tension band construct of a greater trochanter fracture Right shoulder rotator cuff arthropathy  DOS: 03/08/2020  Plan: Healing appropriately.  Radiographs demonstrates maintenance of alignment.  Okay to advance to weightbearing as tolerated.  Continue to follow posterior hip precautions for the next 6 weeks.  Do not need to limit his active hip abduction at this time.  Regarding his right shoulder, he has evidence of glenohumeral arthritis as well as right shoulder rotator cuff arthropathy.  Recommend focusing on symptom control with medications and consideration for repeat injections in the future.  Do not think that he is an appropriate candidate for right reverse shoulder arthroplasty.   Follow-up: Return in about 6 weeks (around 05/31/2020). XR at next visit: [facility] pelvis and left hip  Subjective:  Chief Complaint  Patient presents with  . Routine Post Op    S/P Lt hip replacement 03/08/20. Pt states he's been feeling a lot better the past 2 days.    History of Present Illness: Dominic Hodges is a 87 y.o. male who presents following the above stated procedure.  He states his hip is improving.  He is able to do more with physical therapy.  He continues to have right shoulder pain.  He was injected in clinic at the last visit, and reports some improvement in his symptoms.  He has no issues with activities below the level of the shoulder, but has difficulty getting his right arm above his head.  He remains at the [facility], but feels as though he is improving.  His daughter is in clinic today and is concerned with his continued weight loss.  Review of Systems: No fevers or chills No numbness or tingling No Chest Pain No shortness of breath   Objective: Ht 6' 0.5" (1.842 m)   Wt 140 lb (63.5 kg)   BMI 18.73 kg/m   Physical Exam:  Elderly male, seated in a wheelchair.   No acute distress.  Hip incision is healing well.  No surrounding erythema or drainage.  There is no tenderness palpation over his left hip.  He is able to maintain a straight leg raise.  Sensation is intact distally.  Limited range of motion of his right shoulder.  Abduction to side limited to 60 degrees.  Forward flexion to 70 degrees.  IMAGING: I personally ordered and reviewed the following images:  X-ray of the left hip was obtained in clinic today and demonstrates appropriate alignment of the hip hemiarthroplasty, as well as the greater trochanter fracture.  There is been no interval subsidence.  No evidence of hardware failure or loosening.  There is been no subsidence of the hip prosthesis.  Impression: Left hip hemiarthroplasty in good position with maintenance of alignment of the greater trochanter fracture.   Mordecai Rasmussen, MD 04/19/2020 4:15 PM

## 2020-04-19 NOTE — Patient Instructions (Signed)
WBAT L hip  Activities as tolerated R shoulder  If need home health PT after DC, please contact the clinic if you need a referral

## 2020-04-19 NOTE — Progress Notes (Signed)
Location:  [facility] Room Number: 573 Place of Service:  SNF (31)   CODE STATUS: full code   Allergies  Allergen Reactions  . Robaxin [Methocarbamol] Other (See Comments)    Mental confusion    Chief Complaint  Patient presents with  . Acute Visit    Care plan meeting.     HPI:  We have come together for his care plan meeting to discuss goals of care. He has a very poor appetite. He states that he has no appetite. remeron has been ineffective for helping with appetite. His admission weight was 163 pounds current weight is 140 pounds. He is awaiting a modified barium swallow; is due to see Dr. Melvyn Novas 05-10-20.  His appetite was poor prior to his hospitalization. He is able to ambulate 30 - 40 feet then his gait shuffles. He is working on steps. Hid goal is to return back home with his wife. He will need to be more independent before he can return home. He will need more care at home; his family is working on providing that care.  We have filled out his MOST form. He wishes to remain a full code with doing everything possible. He is wanting a feeding tube if he has a stroke.   Past Medical History:  Diagnosis Date  . Acid reflux    mild-states if doesnt chew food completely he begins to cough- states has weakness in muscles due to ilieus- has been doing exreci this  . Adynamic ileus ([place]) 12/2014   ? anesthesia related per pt  . BPH (benign prostatic hyperplasia)   . Complication of anesthesia    10/11/15- per pt "had [place]- in 11/16-related it to possibly anesthesia"  . H/O seasonal allergies   . Hyperlipidemia   . Hypertension     Past Surgical History:  Procedure Laterality Date  . APPENDECTOMY     approx 30 years ago/lwb  . CATARACT EXTRACTION W/PHACO Left 09/11/2012   Procedure: CATARACT EXTRACTION PHACO AND INTRAOCULAR LENS PLACEMENT (IOC);  Surgeon: Tonny Branch, MD;  Location: [facility];  Service: Ophthalmology;   Laterality: Left;  CDE: 19.52  . HIP ARTHROPLASTY Right 12/06/2014   Procedure: PARTIAL RIGHT HIP REPLACEMENT;  Surgeon: Carole Civil, MD;  Location: [facility];  Service: Orthopedics;  Laterality: Right;  . HIP ARTHROPLASTY Left 03/08/2020   Procedure: ARTHROPLASTY BIPOLAR HIP (HEMIARTHROPLASTY);  Surgeon: Mordecai Rasmussen, MD;  Location: [facility];  Service: Orthopedics;  Laterality: Left;  . LUMBAR LAMINECTOMY/DECOMPRESSION MICRODISCECTOMY N/A 10/19/2015   Procedure: CENTRAL DECOMPRESSION LUMBAR LAMINECTOMY FOR SPINAL STENOSIS, L3,L4FORAMINOTOMY BILATERAL L3,L4 NERVE ROOT;  Surgeon: Latanya Maudlin, MD;  Location: [facility];  Service: Orthopedics;  Laterality: N/A;  . UMBILICAL HERNIA REPAIR     Approx 10 years ago/lwb  . ureteral stone extraction      Social History   Socioeconomic History  . Marital status: Married    Spouse name: Not on file  . Number of children: Not on file  . Years of education: Not on file  . Highest education level: Not on file  Occupational History  . Not on file  Tobacco Use  . Smoking status: Former Smoker    Types: Pipe  . Smokeless tobacco: Never Used  Substance and Sexual Activity  . Alcohol use: Yes    Comment: occasional social drink/lwb  . Drug use: No  . Sexual activity: Not on file  Other Topics Concern  . Not on  file  Social History Narrative  . Not on file   Social Determinants of Health   Financial Resource Strain: Not on file  Food Insecurity: Not on file  Transportation Needs: Not on file  Physical Activity: Not on file  Stress: Not on file  Social Connections: Not on file  Intimate Partner Violence: Not on file   Family History  Problem Relation Age of Onset  . Coronary artery disease Other        no family history of premature CAD      VITAL SIGNS BP 132/80   Pulse 78   Temp 97.9 F (36.6 C)   Ht 6' 0.5" (1.842 m)   Wt 140 lb (63.5 kg)   BMI 18.73 kg/m   Outpatient Encounter Medications as of 04/19/2020  Medication  Sig  . acetaminophen (TYLENOL) 650 MG CR tablet Take 650 mg by mouth every 6 (six) hours as needed for pain.  . Amino Acids-Protein Hydrolys (FEEDING SUPPLEMENT, PRO-STAT SUGAR FREE 64,) LIQD Take 30 mLs by mouth 2 (two) times daily.  Marland Kitchen aspirin 81 MG chewable tablet Chew 1 tablet (81 mg total) by mouth 2 (two) times daily.  Roseanne Kaufman Peru-Castor Oil (VENELEX) OINT Apply topically 3 (three) times daily. Special Instructions: Apply to sacrum, coccyx and bilateral buttocks qshift for prevention.  . Cholecalciferol (VITAMIN D) 125 MCG (5000 UT) CAPS Take 1 capsule by mouth daily in the afternoon.  . cloNIDine (CATAPRES - DOSED IN MG/24 HR) 0.1 mg/24hr patch Place 0.1 mg onto the skin once a week. TUESDAY  . feeding supplement (ENSURE ENLIVE / ENSURE PLUS) LIQD Take 237 mLs by mouth 2 (two) times daily between meals.  . fluorometholone (FML) 0.1 % ophthalmic suspension Place 1 drop into both eyes 2 (two) times daily.  Marland Kitchen guaiFENesin (MUCINEX) 600 MG 12 hr tablet Take 600 mg by mouth 2 (two) times daily.  . NON FORMULARY Diet Regular  . polyethylene glycol (MIRALAX / GLYCOLAX) 17 g packet Take 17 g by mouth daily as needed.  . rosuvastatin (CRESTOR) 20 MG tablet Take 20 mg by mouth daily.   No facility-administered encounter medications on file as of 04/19/2020.     SIGNIFICANT DIAGNOSTIC EXAMS  PREVIOUS   03-07-20: left hip x-ray:  1. Comminuted left femoral neck fracture, extending from the mid left femoral neck through the greater trochanter. Significant impaction and varus angulation.  NO NEW EXAMS.   LABS REVIEWED PREVIOUS   03-07-20: wbc 8.5; hgb 13.0; hct 39.9; mcv 96.4 plt 183; glucose 108; bun 30; creat 1.33; k+4.0; na++ 142; ca 8.9 GFR 51 03-08-20: wbc 10.5; hgb 12.2; hct 36.4 mcv 95.5 plt 177; glucose 116; bun 27; creat 1.13; k+ 4.1; na++ 140; ca 8.6 GFR>60; liver normal albumin 3.7 03-10-20; wbc 10.0; hgb 8.9; hct 27.2; mcv 97.1 plt 129; glucose 101; bun 40; creat 2.00; k+ 4.1; na++ 135;  ca 7.6; GFR 31 03-11-20: glucose 105; bun 41; creat 1.84; k+ 3.5; na++ 137; ca 7.6 GFR 34; phos 3.0 albumin 2.7  04-15-20: wbc 10.0; hgb 13.0; hct 41.2; mcv 93.9 plt 246; glucose 108; bun 41; creat 1.17; k+ 4.2; na++ 143; ca 8.9 GFR59; liver normal albumin 3.6    NO NEW LABS  Review of Systems  Constitutional: Negative for malaise/fatigue.  Respiratory: Negative for cough and shortness of breath.   Cardiovascular: Negative for chest pain, palpitations and leg swelling.  Gastrointestinal: Negative for abdominal pain, constipation and heartburn.  Musculoskeletal: Negative for back pain, joint pain and  myalgias.  Skin: Negative.   Neurological: Negative for dizziness.  Psychiatric/Behavioral: The patient is not nervous/anxious.    Physical Exam Constitutional:      General: He is not in acute distress.    Appearance: He is underweight. He is not diaphoretic.  Neck:     Thyroid: No thyromegaly.  Cardiovascular:     Rate and Rhythm: Normal rate and regular rhythm.     Pulses: Normal pulses.     Heart sounds: Normal heart sounds.  Pulmonary:     Effort: Pulmonary effort is normal. No respiratory distress.     Breath sounds: Normal breath sounds.  Abdominal:     General: Bowel sounds are normal. There is no distension.     Palpations: Abdomen is soft.     Tenderness: There is no abdominal tenderness.  Musculoskeletal:     Cervical back: Neck supple.     Right lower leg: No edema.     Left lower leg: No edema.     Comments: Is able to move all extremities Status post left hip arthroplasty 03-08-20   Lymphadenopathy:     Cervical: No cervical adenopathy.  Skin:    General: Skin is warm and dry.  Neurological:     Mental Status: He is alert and oriented to person, place, and time.  Psychiatric:        Mood and Affect: Mood normal.       ASSESSMENT/ PLAN:  TODAY  1. pharyngoesophageal dysphagia  2. Recent weight loss 3. Stage 3a chronic kidney disease 4. Closed displaced  midcervical fracture of left femur sequela  MOST form has been filled out Will continue therapy as directed Will continue current medications Will continue to monitor his status His goal is to return back home  Time spent with patient 40 minutes: counseling: MOST form filled out; goals of care; weight loss.    Ok Edwards NP [facility]  Contact [phone] Monday through Friday 8am- 5pm  After hours call [phone]

## 2020-04-20 ENCOUNTER — Other Ambulatory Visit (HOSPITAL_COMMUNITY): Payer: Self-pay | Admitting: Specialist

## 2020-04-20 DIAGNOSIS — T17908S Unspecified foreign body in respiratory tract, part unspecified causing other injury, sequela: Secondary | ICD-10-CM

## 2020-04-20 DIAGNOSIS — R1312 Dysphagia, oropharyngeal phase: Secondary | ICD-10-CM

## 2020-04-21 ENCOUNTER — Other Ambulatory Visit (HOSPITAL_COMMUNITY)
Admission: RE | Admit: 2020-04-21 | Discharge: 2020-04-21 | Disposition: A | Discharge status: Home / Self Care | Payer: Medicare Other | Source: Skilled Nursing Facility | Attending: Adult Health | Admitting: Adult Health

## 2020-04-21 DIAGNOSIS — S72001D Fracture of unspecified part of neck of right femur, subsequent encounter for closed fracture with routine healing: Secondary | ICD-10-CM | POA: Diagnosis present

## 2020-04-21 DIAGNOSIS — X58XXXD Exposure to other specified factors, subsequent encounter: Secondary | ICD-10-CM | POA: Diagnosis not present

## 2020-04-21 DIAGNOSIS — S72001A Fracture of unspecified part of neck of right femur, initial encounter for closed fracture: Secondary | ICD-10-CM | POA: Insufficient documentation

## 2020-04-21 LAB — CBC
HCT: 38.5 % — ABNORMAL LOW (ref 39.0–52.0)
Hemoglobin: 11.9 g/dL — ABNORMAL LOW (ref 13.0–17.0)
MCH: 28.7 pg (ref 26.0–34.0)
MCHC: 30.9 g/dL (ref 30.0–36.0)
MCV: 92.8 fL (ref 80.0–100.0)
Platelets: 243 10*3/uL (ref 150–400)
RBC: 4.15 MIL/uL — ABNORMAL LOW (ref 4.22–5.81)
RDW: 14.8 % (ref 11.5–15.5)
WBC: 8.5 10*3/uL (ref 4.0–10.5)
nRBC: 0 % (ref 0.0–0.2)

## 2020-04-21 LAB — COMPREHENSIVE METABOLIC PANEL
ALT: 14 U/L (ref 0–44)
AST: 17 U/L (ref 15–41)
Albumin: 3.4 g/dL — ABNORMAL LOW (ref 3.5–5.0)
Alkaline Phosphatase: 116 U/L (ref 38–126)
Anion gap: 10 (ref 5–15)
BUN: 39 mg/dL — ABNORMAL HIGH (ref 8–23)
CO2: 26 mmol/L (ref 22–32)
Calcium: 8.9 mg/dL (ref 8.9–10.3)
Chloride: 106 mmol/L (ref 98–111)
Creatinine, Ser: 1.11 mg/dL (ref 0.61–1.24)
GFR, Estimated: >60 mL/min (ref >60)
Glucose, Bld: 100 mg/dL — ABNORMAL HIGH (ref 70–99)
Potassium: 3.9 mmol/L (ref 3.5–5.1)
Sodium: 142 mmol/L (ref 135–145)
Total Bilirubin: 0.8 mg/dL (ref 0.3–1.2)
Total Protein: 6.3 g/dL — ABNORMAL LOW (ref 6.5–8.1)

## 2020-04-21 LAB — VITAMIN B12: Vitamin B-12: 421 pg/mL (ref 180–914)

## 2020-04-21 LAB — FOLATE: Folate: 14.2 ng/mL (ref >5.9)

## 2020-04-21 LAB — TSH: TSH: 4.342 u[IU]/mL (ref 0.350–4.500)

## 2020-04-27 ENCOUNTER — Ambulatory Visit (HOSPITAL_COMMUNITY): Payer: Medicare Other | Attending: Internal Medicine | Admitting: Speech Pathology

## 2020-04-27 ENCOUNTER — Non-Acute Institutional Stay (SKILLED_NURSING_FACILITY): Payer: Medicare Other | Admitting: Adult Health

## 2020-04-27 ENCOUNTER — Ambulatory Visit (HOSPITAL_COMMUNITY)
Admission: RE | Admit: 2020-04-27 | Discharge: 2020-04-27 | Disposition: A | Discharge status: Home / Self Care | Payer: Medicare Other | Source: Ambulatory Visit | Attending: Internal Medicine | Admitting: Internal Medicine

## 2020-04-27 ENCOUNTER — Encounter (HOSPITAL_COMMUNITY): Payer: Self-pay | Admitting: Speech Pathology

## 2020-04-27 DIAGNOSIS — R1314 Dysphagia, pharyngoesophageal phase: Secondary | ICD-10-CM

## 2020-04-27 DIAGNOSIS — R1312 Dysphagia, oropharyngeal phase: Secondary | ICD-10-CM | POA: Insufficient documentation

## 2020-04-27 DIAGNOSIS — R1310 Dysphagia, unspecified: Secondary | ICD-10-CM | POA: Diagnosis not present

## 2020-04-27 DIAGNOSIS — T17908S Unspecified foreign body in respiratory tract, part unspecified causing other injury, sequela: Secondary | ICD-10-CM | POA: Insufficient documentation

## 2020-04-27 DIAGNOSIS — T17320A Food in larynx causing asphyxiation, initial encounter: Secondary | ICD-10-CM | POA: Diagnosis not present

## 2020-04-27 NOTE — Therapy (Signed)
[facility] [place], [place], [address] Phone: [phone]   Fax:  [phone]  Modified Barium Swallow  Patient Details  Name: Dominic Hodges MRN: [id number] Date of Birth: 04/23/1927 No data recorded  Encounter Date: 04/27/2020   End of Session - 04/27/20 1632    Visit Number 1    Number of Visits 1    SLP Start Time 2703    SLP Stop Time  1500    SLP Time Calculation (min) 83 min    Activity Tolerance Patient tolerated treatment well            Today's Date: 04/27/2020 Time: No data recorded-No data recorded No data recorded  Past Medical History:  Past Medical History:  Diagnosis Date  . Acid reflux    mild-states if doesnt chew food completely he begins to cough- states has weakness in muscles due to ilieus- has been doing exreci this  . Adynamic ileus ([place]) 12/2014   ? anesthesia related per pt  . BPH (benign prostatic hyperplasia)   . Complication of anesthesia    10/11/15- per pt "had [place]- in 11/16-related it to possibly anesthesia"  . H/O seasonal allergies   . Hyperlipidemia   . Hypertension    Past Surgical History:  Past Surgical History:  Procedure Laterality Date  . APPENDECTOMY     approx 30 years ago/lwb  . CATARACT EXTRACTION W/PHACO Left 09/11/2012   Procedure: CATARACT EXTRACTION PHACO AND INTRAOCULAR LENS PLACEMENT (IOC);  Surgeon: Tonny Branch, MD;  Location: [facility];  Service: Ophthalmology;  Laterality: Left;  CDE: 19.52  . HIP ARTHROPLASTY Right 12/06/2014   Procedure: PARTIAL RIGHT HIP REPLACEMENT;  Surgeon: Carole Civil, MD;  Location: [facility];  Service: Orthopedics;  Laterality: Right;  . HIP ARTHROPLASTY Left 03/08/2020   Procedure: ARTHROPLASTY BIPOLAR HIP (HEMIARTHROPLASTY);  Surgeon: Mordecai Rasmussen, MD;  Location: [facility];  Service: Orthopedics;  Laterality: Left;  . LUMBAR LAMINECTOMY/DECOMPRESSION MICRODISCECTOMY N/A 10/19/2015   Procedure:  CENTRAL DECOMPRESSION LUMBAR LAMINECTOMY FOR SPINAL STENOSIS, L3,L4FORAMINOTOMY BILATERAL L3,L4 NERVE ROOT;  Surgeon: Latanya Maudlin, MD;  Location: [facility];  Service: Orthopedics;  Laterality: N/A;  . UMBILICAL HERNIA REPAIR     Approx 10 years ago/lwb  . 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  Significant medical history includes essential hypertension, dyslipidemia, nephrolithiasis, BPH, and GERD. Per Pt report, he has had dysphagia and cough since intubation for a laminectomy in 2017.  Apparently the intubation was traumatic and postoperatively he had a swollen epiglottis.  Since that time his wife states that he has had a persistent cough and his speech has been affected. He is currently at the [place] center where he went to receive skilled rehab, however based on chart review, it appears that he is complaining of dysphagia as his primary concern and he has experienced significant weight loss since his admission at [facility].   Subjective: "I want someone to tell me what's wrong"    Assessment / Plan / Recommendation  CHL IP CLINICAL IMPRESSIONS 04/27/2020  Clinical Impression Pt presents with severe sensorimotor pharyngeal dysphagia characterized by SILENT aspiration of all textures/consistencies presented (thin, NTL, HTL,  puree and regular); further note moderate to severe amounts of pyriform and valleculae residue after the initial swallow and despite multiple swallows (5-10 for solids) all residue is not cleared but eventually silently aspirated or regurgitated which is then expectorated or swallowed again. Pharyngeal stage of swallowing is characterized by decreased base of tongue retraction, decreased epiglottic deflection, decreased  pharyngeal squeeze, and decreased laryngeal vestibule closure (LVC). Thin liquids and NTL result in premature spillage which enter the laryngeal vestibule before the swallow, fall to the cords and are aspirated during or after the swallow. HTL, solid textures and puree textures typically are not immediately aspirated, but after the swallow residue likely mixes with secretions and evidence of aspiration of residue after the swallow was visualized with all. Head turn to left and Right, effortful (hard fast swallow after holding bolus), and chin tuck were all attempted with no benefit. Some presentations of HTL only resulted in trace aspiration after the swallow. Pt is at high risk of malnutrition and dehydration. Largely secondary to Pt's inability to swallow enough solid textures to nutritionally support him and considering the VERY high risk of aspiration, recommend alternative means of nutrition; per free water protocol recommend water only in between meals after oral care. Further recommend ST dysphagia therapy to initiate EMST, dysphagia therapy (with trials of solids and liquids) and to provide further education/support.    SLP Visit Diagnosis Dysphagia, pharyngeal phase (R13.13)  Attention and concentration deficit following --  Frontal lobe and executive function deficit following --  Impact on safety and function Severe aspiration risk;Risk for inadequate nutrition/hydration      CHL IP TREATMENT RECOMMENDATION 04/27/2020  Treatment Recommendations Therapy as outlined in treatment plan below     Prognosis 04/27/2020  Prognosis for Safe Diet Advancement Guarded  Barriers to Reach Goals Severity of deficits;Time post onset  Barriers/Prognosis Comment --    CHL IP DIET RECOMMENDATION 04/27/2020  SLP Diet Recommendations NPO;Alternative means - long-term;Ice chips PRN after oral care;Free water protocol after oral care  Liquid Administration via Cup;No straw  Medication Administration Via  alternative means  Compensations --  Postural Changes --      CHL IP OTHER RECOMMENDATIONS 04/27/2020  Recommended Consults Consider ENT evaluation  Oral Care Recommendations Oral care QID  Other Recommendations --      CHL IP FOLLOW UP RECOMMENDATIONS 04/27/2020  Follow up Recommendations Skilled Nursing facility      No flowsheet data found.         CHL IP ORAL PHASE 04/27/2020  Oral Phase WFL  Oral - Pudding Teaspoon --  Oral - Pudding Cup --  Oral - Honey Teaspoon --  Oral - Honey Cup --  Oral - Nectar Teaspoon --  Oral - Nectar Cup --  Oral - Nectar Straw --  Oral - Thin Teaspoon --  Oral - Thin Cup --  Oral - Thin Straw --  Oral - Puree --  Oral - Mech Soft --  Oral - Regular --  Oral - Multi-Consistency --  Oral - Pill --  Oral Phase - Comment --    CHL IP PHARYNGEAL PHASE 04/27/2020  Pharyngeal Phase Impaired  Pharyngeal- Pudding Teaspoon --  Pharyngeal --  Pharyngeal- Pudding Cup --  Pharyngeal --  Pharyngeal- Honey Teaspoon Reduced pharyngeal peristalsis;Reduced epiglottic inversion;Reduced anterior laryngeal mobility;Reduced laryngeal elevation;Reduced airway/laryngeal closure;Reduced tongue base retraction;Penetration/Aspiration before swallow;Penetration/Aspiration during swallow;Penetration/Apiration after swallow;Moderate aspiration;Pharyngeal residue - valleculae;Pharyngeal residue - pyriform;Delayed swallow initiation-pyriform sinuses;Compensatory strategies attempted (with notebox)  Pharyngeal Material enters airway, passes  BELOW cords without attempt by patient to eject out (silent aspiration)  Pharyngeal- Honey Cup Reduced pharyngeal peristalsis;Reduced epiglottic inversion;Reduced anterior laryngeal mobility;Reduced laryngeal elevation;Reduced airway/laryngeal closure;Reduced tongue base retraction;Penetration/Aspiration before swallow;Penetration/Aspiration during swallow;Penetration/Apiration after swallow;Moderate aspiration;Pharyngeal residue -  valleculae;Pharyngeal residue - pyriform;Delayed swallow initiation-pyriform sinuses;Compensatory strategies attempted (with notebox)  Pharyngeal Material enters airway, passes BELOW cords without attempt by patient to eject out (silent aspiration)  Pharyngeal- Nectar Teaspoon Reduced pharyngeal peristalsis;Reduced epiglottic inversion;Reduced anterior laryngeal mobility;Reduced laryngeal elevation;Reduced airway/laryngeal closure;Reduced tongue base retraction;Penetration/Aspiration before swallow;Penetration/Aspiration during swallow;Penetration/Apiration after swallow;Moderate aspiration;Pharyngeal residue - valleculae;Pharyngeal residue - pyriform;Delayed swallow initiation-pyriform sinuses;Compensatory strategies attempted (with notebox)  Pharyngeal Material enters airway, passes BELOW cords without attempt by patient to eject out (silent aspiration)  Pharyngeal- Nectar Cup Reduced pharyngeal peristalsis;Reduced epiglottic inversion;Reduced anterior laryngeal mobility;Reduced laryngeal elevation;Reduced airway/laryngeal closure;Reduced tongue base retraction;Penetration/Aspiration before swallow;Penetration/Aspiration during swallow;Penetration/Apiration after swallow;Moderate aspiration;Pharyngeal residue - valleculae;Pharyngeal residue - pyriform;Delayed swallow initiation-pyriform sinuses;Compensatory strategies attempted (with notebox)  Pharyngeal Material enters airway, passes BELOW cords without attempt by patient to eject out (silent aspiration)  Pharyngeal- Nectar Straw NT  Pharyngeal --  Pharyngeal- Thin Teaspoon Reduced pharyngeal peristalsis;Reduced epiglottic inversion;Reduced anterior laryngeal mobility;Reduced laryngeal elevation;Reduced airway/laryngeal closure;Reduced tongue base retraction;Penetration/Aspiration before swallow;Penetration/Aspiration during swallow;Penetration/Apiration after swallow;Moderate aspiration;Pharyngeal residue - valleculae;Pharyngeal residue -  pyriform;Delayed swallow initiation-pyriform sinuses;Compensatory strategies attempted (with notebox)  Pharyngeal Material enters airway, passes BELOW cords without attempt by patient to eject out (silent aspiration)  Pharyngeal- Thin Cup Reduced pharyngeal peristalsis;Reduced epiglottic inversion;Reduced anterior laryngeal mobility;Reduced laryngeal elevation;Reduced airway/laryngeal closure;Reduced tongue base retraction;Penetration/Aspiration before swallow;Penetration/Aspiration during swallow;Penetration/Apiration after swallow;Moderate aspiration;Pharyngeal residue - valleculae;Pharyngeal residue - pyriform;Delayed swallow initiation-pyriform sinuses;Compensatory strategies attempted (with notebox)  Pharyngeal Material enters airway, passes BELOW cords without attempt by patient to eject out (silent aspiration)  Pharyngeal- Thin Straw Reduced pharyngeal peristalsis;Reduced epiglottic inversion;Reduced anterior laryngeal mobility;Reduced laryngeal elevation;Reduced airway/laryngeal closure;Reduced tongue base retraction;Penetration/Aspiration before swallow;Penetration/Aspiration during swallow;Penetration/Apiration after swallow;Moderate aspiration;Pharyngeal residue - valleculae;Pharyngeal residue - pyriform;Delayed swallow initiation-pyriform sinuses;Compensatory strategies attempted (with notebox)  Pharyngeal Material enters airway, passes BELOW cords without attempt by patient to eject out (silent aspiration)  Pharyngeal- Puree Reduced pharyngeal peristalsis;Reduced epiglottic inversion;Reduced laryngeal elevation;Penetration/Apiration after swallow;Trace aspiration;Pharyngeal residue - valleculae;Pharyngeal residue - pyriform  Pharyngeal Material enters airway, passes BELOW cords without attempt by patient to eject out (silent aspiration)  Pharyngeal- Mechanical Soft NT  Pharyngeal --  Pharyngeal- Regular Reduced pharyngeal peristalsis;Reduced epiglottic inversion;Reduced laryngeal  elevation;Penetration/Apiration after swallow;Trace aspiration;Pharyngeal residue - valleculae;Pharyngeal residue - pyriform  Pharyngeal Material enters airway, passes BELOW cords without attempt by patient to eject out (silent aspiration)  Pharyngeal- Multi-consistency NT  Pharyngeal --  Pharyngeal- Pill (No Data)  Pharyngeal --  Pharyngeal Comment --     No flowsheet data found.   Wende Bushy 04/27/2020, 4:54 PM     Patient will benefit from skilled therapeutic intervention in order to improve the following deficits and impairments:   Dysphagia, oropharyngeal phase   Problem List Patient Active Problem List   Diagnosis Date Noted  . History of anesthesia complications 40/98/1191  . Recent weight loss 04/14/2020  . Normochromic normocytic anemia 04/14/2020  . Dysphagia 04/14/2020  . Cough 04/13/2020  . Anorexia 04/07/2020  . Failure to thrive in adult 04/07/2020  . Protein-calorie malnutrition, severe ([place]) 03/16/2020  . Chronic constipation 03/15/2020  . Acute blood loss anemia 03/15/2020  . Thrombocytopenia ([place]) 03/15/2020  . Closed left femoral fracture ([place]) 03/07/2020  . Accidental fall 03/07/2020  . CKD (chronic kidney disease), stage III ([place]) 03/07/2020  . Spinal  stenosis, lumbar region, with neurogenic claudication 10/19/2015  . Closed right hip fracture ([place]) 12/05/2014  . Closed hip fracture ([place]) 12/05/2014  . BPH (benign prostatic hyperplasia) 12/05/2014  . Hyperlipidemia 12/05/2014  . Essential hypertension, benign 06/01/2010       Cottrell Gentles H. Roddie Mc, CCC-SLP Speech Language Pathologist   Wende Bushy 04/27/2020, 4:53 PM  [place] [address] [place], [place], [address] Phone: [phone]   Fax:  [phone]  Name: Dominic Hodges MRN: [id number] Date of Birth: 1927-05-31

## 2020-04-27 NOTE — Progress Notes (Signed)
Location:  [facility] Room Number: 518 Place of Service:  SNF (31)   CODE STATUS: full code   Allergies  Allergen Reactions  . Robaxin [Methocarbamol] Other (See Comments)    Mental confusion    Chief Complaint  Patient presents with  . Acute Visit    Discuss swallow study results     HPI:  He has had a modified barium swallow done today. He has failed the test with the recommendation for NPO. He is having to swallow at least 5 times to get applesauce swallowed and still aspirated applesauce. We have had had a prolonged discussion with his family regarding future care options and goals of care. He is silently aspirating. We have discussed the following options: no feeding tube continue current diet or downgraded diet knowing that he will aspirate and develop pneumonia. To insert a feeding tube for nutrition; allow for free water after oral care. The family was educated that tube placement will not guarantee that he will not aspirate. The family has verbalized understanding.    His cognitive impairment has gotten worse. He frequently asks the same question; needs frequent reassurance about the information he has been given. He would like to choose the interventional radiologist and wants to speak with an MD.   Past Medical History:  Diagnosis Date  . Acid reflux    mild-states if doesnt chew food completely he begins to cough- states has weakness in muscles due to ilieus- has been doing exreci this  . Adynamic ileus ([place]) 12/2014   ? anesthesia related per pt  . BPH (benign prostatic hyperplasia)   . Complication of anesthesia    10/11/15- per pt "had [facility]- in 11/16-related it to possibly anesthesia"  . H/O seasonal allergies   . Hyperlipidemia   . Hypertension     Past Surgical History:  Procedure Laterality Date  . APPENDECTOMY     approx 30 years ago/lwb  . CATARACT EXTRACTION W/PHACO Left 09/11/2012   Procedure:  CATARACT EXTRACTION PHACO AND INTRAOCULAR LENS PLACEMENT (IOC);  Surgeon: Tonny Branch, MD;  Location: [facility];  Service: Ophthalmology;  Laterality: Left;  CDE: 19.52  . HIP ARTHROPLASTY Right 12/06/2014   Procedure: PARTIAL RIGHT HIP REPLACEMENT;  Surgeon: Carole Civil, MD;  Location: [facility];  Service: Orthopedics;  Laterality: Right;  . HIP ARTHROPLASTY Left 03/08/2020   Procedure: ARTHROPLASTY BIPOLAR HIP (HEMIARTHROPLASTY);  Surgeon: Mordecai Rasmussen, MD;  Location: [facility];  Service: Orthopedics;  Laterality: Left;  . LUMBAR LAMINECTOMY/DECOMPRESSION MICRODISCECTOMY N/A 10/19/2015   Procedure: CENTRAL DECOMPRESSION LUMBAR LAMINECTOMY FOR SPINAL STENOSIS, L3,L4FORAMINOTOMY BILATERAL L3,L4 NERVE ROOT;  Surgeon: Latanya Maudlin, MD;  Location: [facility];  Service: Orthopedics;  Laterality: N/A;  . UMBILICAL HERNIA REPAIR     Approx 10 years ago/lwb  . ureteral stone extraction      Social History   Socioeconomic History  . Marital status: Married    Spouse name: Not on file  . Number of children: Not on file  . Years of education: Not on file  . Highest education level: Not on file  Occupational History  . Not on file  Tobacco Use  . Smoking status: Former Smoker    Types: Pipe  . Smokeless tobacco: Never Used  Substance and Sexual Activity  . Alcohol use: Yes    Comment: occasional social drink/lwb  . Drug use: No  . Sexual activity: Not on file  Other Topics Concern  . Not  on file  Social History Narrative  . Not on file   Social Determinants of Health   Financial Resource Strain: Not on file  Food Insecurity: Not on file  Transportation Needs: Not on file  Physical Activity: Not on file  Stress: Not on file  Social Connections: Not on file  Intimate Partner Violence: Not on file   Family History  Problem Relation Age of Onset  . Coronary artery disease Other        no family history of premature CAD      VITAL SIGNS BP (!) 119/56   Pulse 70   Temp 97.8 F  (36.6 C)   Resp 18   Ht 6' 0.5" (1.842 m)   Wt 140 lb (63.5 kg)   BMI 18.73 kg/m   Outpatient Encounter Medications as of 04/27/2020  Medication Sig  . acetaminophen (TYLENOL) 650 MG CR tablet Take 650 mg by mouth every 6 (six) hours as needed for pain.  . Amino Acids-Protein Hydrolys (FEEDING SUPPLEMENT, PRO-STAT SUGAR FREE 64,) LIQD Take 30 mLs by mouth 2 (two) times daily.  Marland Kitchen aspirin 81 MG chewable tablet Chew 1 tablet (81 mg total) by mouth 2 (two) times daily.  Roseanne Kaufman Peru-Castor Oil (VENELEX) OINT Apply topically 3 (three) times daily. Special Instructions: Apply to sacrum, coccyx and bilateral buttocks qshift for prevention.  . Cholecalciferol (VITAMIN D) 125 MCG (5000 UT) CAPS Take 1 capsule by mouth daily in the afternoon.  . cloNIDine (CATAPRES - DOSED IN MG/24 HR) 0.1 mg/24hr patch Place 0.1 mg onto the skin once a week. TUESDAY  . feeding supplement (ENSURE ENLIVE / ENSURE PLUS) LIQD Take 237 mLs by mouth 2 (two) times daily between meals.  . fluorometholone (FML) 0.1 % ophthalmic suspension Place 1 drop into both eyes 2 (two) times daily.  Marland Kitchen guaiFENesin (MUCINEX) 600 MG 12 hr tablet Take 600 mg by mouth 2 (two) times daily.  . NON FORMULARY Diet Regular  . polyethylene glycol (MIRALAX / GLYCOLAX) 17 g packet Take 17 g by mouth daily as needed.  . rosuvastatin (CRESTOR) 20 MG tablet Take 20 mg by mouth daily.   No facility-administered encounter medications on file as of 04/27/2020.     SIGNIFICANT DIAGNOSTIC EXAMS   PREVIOUS   03-07-20: left hip x-ray:  1. Comminuted left femoral neck fracture, extending from the mid left femoral neck through the greater trochanter. Significant impaction and varus angulation.  NO NEW EXAMS.   LABS REVIEWED PREVIOUS   03-07-20: wbc 8.5; hgb 13.0; hct 39.9; mcv 96.4 plt 183; glucose 108; bun 30; creat 1.33; k+4.0; na++ 142; ca 8.9 GFR 51 03-08-20: wbc 10.5; hgb 12.2; hct 36.4 mcv 95.5 plt 177; glucose 116; bun 27; creat 1.13; k+ 4.1;  na++ 140; ca 8.6 GFR>60; liver normal albumin 3.7 03-10-20; wbc 10.0; hgb 8.9; hct 27.2; mcv 97.1 plt 129; glucose 101; bun 40; creat 2.00; k+ 4.1; na++ 135; ca 7.6; GFR 31 03-11-20: glucose 105; bun 41; creat 1.84; k+ 3.5; na++ 137; ca 7.6 GFR 34; phos 3.0 albumin 2.7  04-15-20: wbc 10.0; hgb 13.0; hct 41.2; mcv 93.9 plt 246; glucose 108; bun 41; creat 1.17; k+ 4.2; na++ 143; ca 8.9 GFR59; liver normal albumin 3.6    NO NEW LABS  Review of Systems  Constitutional: Negative for malaise/fatigue.  Respiratory: Negative for cough and shortness of breath.   Cardiovascular: Negative for chest pain, palpitations and leg swelling.  Gastrointestinal: Negative for abdominal pain, constipation and heartburn.  Musculoskeletal:  Negative for back pain, joint pain and myalgias.  Skin: Negative.   Neurological: Negative for dizziness.  Psychiatric/Behavioral: The patient is not nervous/anxious.     Physical Exam Constitutional:      General: He is not in acute distress.    Appearance: He is well-developed. He is not diaphoretic.  Neck:     Thyroid: No thyromegaly.  Cardiovascular:     Rate and Rhythm: Normal rate and regular rhythm.     Pulses: Normal pulses.     Heart sounds: Normal heart sounds.  Pulmonary:     Effort: Pulmonary effort is normal. No respiratory distress.     Comments: Lung sounds coarse  Abdominal:     General: Bowel sounds are normal. There is no distension.     Palpations: Abdomen is soft.     Tenderness: There is no abdominal tenderness.  Musculoskeletal:     Cervical back: Neck supple.     Right lower leg: No edema.     Left lower leg: No edema.     Comments:  Is able to move all extremities Status post left hip arthroplasty 03-08-20    Lymphadenopathy:     Cervical: No cervical adenopathy.  Skin:    General: Skin is warm and dry.  Neurological:     Mental Status: He is alert. Mental status is at baseline.  Psychiatric:        Mood and Affect: Mood normal.       ASSESSMENT/ PLAN:  TODAY  1. Oropharyngeal dysphagia:  His status is worse He will remain NPO except for water after oral care Will setup for peg tube placement.   Time spent with patient 60 minutes: we have discussed his care options; goals of care. Family has verbalized understanding.   Ok Edwards NP [facility]  Contact [phone] Monday through Friday 8am- 5pm  After hours call [phone]

## 2020-04-28 ENCOUNTER — Non-Acute Institutional Stay (SKILLED_NURSING_FACILITY): Payer: Medicare Other | Admitting: Internal Medicine

## 2020-04-28 ENCOUNTER — Encounter: Payer: Self-pay | Admitting: Internal Medicine

## 2020-04-28 DIAGNOSIS — R29818 Other symptoms and signs involving the nervous system: Secondary | ICD-10-CM

## 2020-04-28 DIAGNOSIS — R1314 Dysphagia, pharyngoesophageal phase: Secondary | ICD-10-CM | POA: Diagnosis not present

## 2020-04-28 DIAGNOSIS — E43 Unspecified severe protein-calorie malnutrition: Secondary | ICD-10-CM

## 2020-04-28 DIAGNOSIS — R4189 Other symptoms and signs involving cognitive functions and awareness: Secondary | ICD-10-CM | POA: Diagnosis not present

## 2020-04-28 NOTE — Patient Instructions (Signed)
Dominic Hodges  04/28/2020     @PREFPERIOPPHARMACY @   Your procedure is scheduled on 05/03/2020   Report to [facility] at  [place]     You must have someone with you while we wait on your COVID results.   Call this number if you have problems the morning of surgery:  [phone]   Remember:  Do not eat or drink after midnight.                        Take these medicines the morning of surgery with A SIP OF WATER  NONE     Please brush your teeth.  Do not wear jewelry, make-up or nail polish.  Do not wear lotions, powders, or perfumes, or deodorant.  Do not shave 48 hours prior to surgery.  Men may shave face and neck.  Do not bring valuables to the hospital.  [facility] is not responsible for any belongings or valuables.  Contacts, dentures or bridgework may not be worn into surgery.  Leave your suitcase in the car.  After surgery it may be brought to your room.  For patients admitted to the hospital, discharge time will be determined by your treatment team.  Patients discharged the day of surgery will not be allowed to drive home.      Please read over the following fact sheets that you were given. Anesthesia Post-op Instructions and Care and Recovery After Surgery       Incision Care, Adult An incision is a cut that a doctor makes in your skin for surgery. Most times, these cuts are closed after surgery. Your cut from surgery may be closed with:  Stitches (sutures).  Staples.  Skin glue.  Skin tape (adhesive) strips. You may need to return to your doctor to have stitches or staples taken out. This may happen many days or many weeks after your surgery. You need to take good care of your cut so it does not get infected. Follow instructions from your doctor about how to care for your cut. Supplies needed:  Soap and water.  A clean hand towel.  Wound cleanser.  A clean bandage (dressing), if needed.  Cream or ointment, if told by your  doctor.  Clean gauze. How to care for your cut from surgery Cleaning your cut Ask your doctor how to clean your cut. You may need to:  Use mild soap and water, or a wound cleanser.  Use a clean gauze to pat your cut dry after you clean it. Changing your bandage  Wash your hands with soap and water for at least 20 seconds before and after you change your bandage. If you cannot use soap and water, use hand sanitizer.  Change your bandage as told by your doctor.  Leave stitches, staples, skin glue, or skin tape strips in place. They may need to stay in place for 2 weeks or longer. If tape strips get loose and curl up, you may trim the loose edges. Do not remove tape strips completely unless your doctor says it is okay.  Put a cream or ointment on your cut. Do this only as told.  Cover your cut with a clean bandage.  Ask your doctor when you can leave your cut uncovered. Checking for infection Check your cut area every day for signs of infection. Check for:  More redness, swelling, or pain.  More fluid or blood.  Warmth.  Pus or a bad smell.   Follow these instructions at home Medicines  Take over-the-counter and prescription medicines only as told by your doctor.  If you were prescribed an antibiotic medicine, cream, or ointment, use it as told by your doctor. Do not stop using the antibiotic even if your condition improves. Eating and drinking  Eat foods that have a lot of certain nutrients, such as protein, vitamin A, and vitamin C. These foods help your cut heal. ? Foods rich in protein include meat, fish, eggs, dairy, beans, and nuts. ? Foods rich in vitamin A include carrots and dark green, leafy vegetables. ? Foods rich in vitamin C include citrus fruits, tomatoes, broccoli, and peppers.  Drink enough fluid to keep your pee (urine) pale yellow. General instructions  Do not take baths, swim, use a hot tub, or put your cut underwater until your doctor approves. Ask  your doctor if you may take showers. You may only be allowed to take sponge baths.  Limit movement around your cut. This helps with healing. ? Try not to strain, lift, or exercise for the first 2 weeks, or for as long as told by your doctor. ? Return to your normal activities as told by your doctor. Ask your doctor what activities are safe for you.  Do not scratch or pick at your cut. Keep it covered as told by your doctor.  Protect your cut from the sun when you are outside for the first 6 months, or for as long as told by your doctor. Cover up the scar area or put on sunscreen that has an SPF of at least 30.  Do not use any products that contain nicotine or tobacco, such as cigarettes, e-cigarettes, and chewing tobacco. These can delay cut healing after surgery. If you need help quitting, ask your doctor.  Keep all follow-up visits as told by your doctor. This is important.   Contact a doctor if:  You have any of these signs of infection around your cut: ? More redness, swelling, or pain. ? More fluid or blood. ? Warmth. ? Pus or a bad smell.  You have a fever.  You feel like you may vomit (nauseous).  You vomit.  You are dizzy.  Your stitches, staples, skin glue, or tape strips come undone. Get help right away if:  Your cut has a red streak coming from it.  Your cut bleeds through your bandage, and bleeding does not stop with gentle pressure.  Your cut opens up and comes apart.  Your body reacts very badly to an infection. This may include: ? A fever, chills, or feeling cold. ? Feeling mixed up, worried, or nervous. ? Very bad pain. ? Trouble breathing. ? A fast heartbeat. ? Clammy or sweaty skin. ? A rash. These symptoms may be an emergency. Do not wait to see if the symptoms will go away. Get medical help right away. Call your local emergency services (911 in the [place] Do not drive yourself to the hospital. Summary  Follow instructions from your doctor about how  to care for your cut from surgery.  Wash your hands with soap and water for at least 20 seconds before and after you change your bandage. If you cannot use soap and water, use hand sanitizer.  Check your cut area every day for signs of infection.  Keep all follow-up visits as told by your doctor. This is important. This information is not intended to replace advice given to you  by your health care provider. Make sure you discuss any questions you have with your health care provider. Document Revised: 10/15/2018 Document Reviewed: 10/15/2018 Elsevier Patient Education  2021 [place] After This sheet gives you information about how to care for yourself after your procedure. Your health care provider may also give you more specific instructions. If you have problems or questions, contact your health care provider. What can I expect after the procedure? After the procedure, it is common to have:  Tiredness.  Forgetfulness about what happened after the procedure.  Impaired judgment for important decisions.  Nausea or vomiting.  Some difficulty with balance. Follow these instructions at home: For the time period you were told by your health care provider:  Rest as needed.  Do not participate in activities where you could fall or become injured.  Do not drive or use machinery.  Do not drink alcohol.  Do not take sleeping pills or medicines that cause drowsiness.  Do not make important decisions or sign legal documents.  Do not take care of children on your own.      Eating and drinking  Follow the diet that is recommended by your health care provider.  Drink enough fluid to keep your urine pale yellow.  If you vomit: ? Drink water, juice, or soup when you can drink without vomiting. ? Make sure you have little or no nausea before eating solid foods. General instructions  Have a responsible adult stay with you for the time you are  told. It is important to have someone help care for you until you are awake and alert.  Take over-the-counter and prescription medicines only as told by your health care provider.  If you have sleep apnea, surgery and certain medicines can increase your risk for breathing problems. Follow instructions from your health care provider about wearing your sleep device: ? Anytime you are sleeping, including during daytime naps. ? While taking prescription pain medicines, sleeping medicines, or medicines that make you drowsy.  Avoid smoking.  Keep all follow-up visits as told by your health care provider. This is important. Contact a health care provider if:  You keep feeling nauseous or you keep vomiting.  You feel light-headed.  You are still sleepy or having trouble with balance after 24 hours.  You develop a rash.  You have a fever.  You have redness or swelling around the IV site. Get help right away if:  You have trouble breathing.  You have new-onset confusion at home. Summary  For several hours after your procedure, you may feel tired. You may also be forgetful and have poor judgment.  Have a responsible adult stay with you for the time you are told. It is important to have someone help care for you until you are awake and alert.  Rest as told. Do not drive or operate machinery. Do not drink alcohol or take sleeping pills.  Get help right away if you have trouble breathing, or if you suddenly become confused. This information is not intended to replace advice given to you by your health care provider. Make sure you discuss any questions you have with your health care provider. Document Revised: 09/10/2019 Document Reviewed: 11/27/2018 Elsevier Patient Education  2021 [employer].

## 2020-04-28 NOTE — Assessment & Plan Note (Addendum)
Despite the dysphagia and poor oral intake; albumin has improved to 3.4 and total protein to 6.3.  Nutrition involvement is essential especially after PEG placement.

## 2020-04-28 NOTE — Patient Instructions (Signed)
See assessment and plan under each diagnosis in the problem list and acutely for this visit Total time 43 minutes; greater than 50% of the visit spent counseling patient & family ( wife, daughter & granddaughter) and coordinating care for problems addressed at this encounter

## 2020-04-28 NOTE — Addendum Note (Signed)
Addended byMarisa Cyphers C on: 04/28/2020 02:53 PM   Modules accepted: Orders

## 2020-04-28 NOTE — Assessment & Plan Note (Signed)
Findings and recommendations discussed with his daughter and granddaughter.  His granddaughter is an ICU nurse and fully comprehends the pathophysiology.

## 2020-04-28 NOTE — Progress Notes (Signed)
   NURSING HOME LOCATION:  [facility] ROOM NUMBER:  131 P  CODE STATUS:  Full Code  PCP:   Celene Squibb MD  This is a nursing facility follow up visit for specific issue of dysphagia.  Interim medical record and care since last SNF visit was updated with review of diagnostic studies and change in clinical status since last visit were documented.  HPI: He has had dysphagia and cough since intubation for laminectomy in 2017.  Apparently it was a traumatic intubation resulting in epiglottic edema.  His wife states that since that time he had a persistent cough and his speech has been affected. Barium swallow was completed 4/20 revealing laryngeal penetration and aspiration of contrast on multiple swallows both direct laryngeal penetration and aspiration as well as aspiration of piriform sinus residuals.  This was present with both thin and nectar consistent barium.  There were increasing vallecular residuals with honey, applesauce and cracker consistency.  He was felt to have silent aspiration without spontaneous cough reflex.  He failed epiglottic inversion throughout the exam. Dr. Aviva Signs, General Surgeon will be consulting to consider placement of a PEG tube.  Review of systems: Clinically there appears to be neurocognitive deficits.  He could not give me the year and could not define the year he had had the laminectomy with the possible laryngeal trauma with intubation.  He also repeatedly asked me if he could start eating.  Obviously he is unaware of how severe and consistent the dysphagia and aspiration is.  Constitutional: No fever ENT/mouth: No  sore throat  Cardiovascular: No chest pain, palpitations, paroxysmal nocturnal dyspnea, edema  Respiratory: No  hemoptysis   Gastrointestinal: No heartburn,  abdominal pain, nausea /vomiting, rectal bleeding, melena   Physical exam:  Pertinent or positive findings: He appears chronically ill and suboptimally nourished.   Pattern alopecia is noted.  Speech is slightly slurred.  There is an ectropion of the left lower lid.  The left nasolabial fold is decreased.  Heart sounds are distant.  Grade 1/2 systolic murmur is suggested.  He has diffuse low-grade rhonchi.  Right dorsalis pedis pulses stronger than the other pulses.  The posterior tibial pulses are decreased more than the L dorsalis pedis pulses.  He has keratotic lesions over the shins, greater on the left.  He has splotchy hyperpigmentation in the malar areas.  There is localized vitiligo at the left temple.  He has bruising extensively over the forearms.  General appearance: no acute distress, increased work of breathing is present.   Lymphatic: No lymphadenopathy about the head, neck, axilla. Eyes: No  lid edema is present. There is no scleral icterus. Ears:  External ear exam shows no significant lesions or deformities.   Nose:  External nasal examination shows no deformity or inflammation. Nasal mucosa are pink and moist without lesions, exudates Oral exam:  Lips and gums are healthy appearing. Neck:  No thyromegaly, masses, tenderness noted.    Heart:  Normal rate and regular rhythm. S1 and S2 normal without gallop,  click, rub .  Lungs:  without wheezes,  rales, rubs. Abdomen: Bowel sounds are normal. Abdomen is soft and nontender with no organomegaly, hernias, masses. GU: Deferred  Extremities:  No cyanosis, clubbing, edema  Skin: Warm & dry w/o tenting.  See summary under each active problem in the Problem List with associated updated therapeutic plan

## 2020-04-28 NOTE — Assessment & Plan Note (Signed)
May need to consider formal MMSE (SLUMS) if issue progressive post improved nutrition.

## 2020-04-29 DIAGNOSIS — R1310 Dysphagia, unspecified: Secondary | ICD-10-CM

## 2020-04-30 DIAGNOSIS — R1310 Dysphagia, unspecified: Secondary | ICD-10-CM

## 2020-04-30 NOTE — H&P (Signed)
Dominic Hodges is an 87 y.o. male.   Chief Complaint: Dysphagia HPI: Patient is a 87 year old white male who recently was found on swallowing study to have significant dysphagia.  I have been asked to place a gastrostomy tube to help maintain nutrition and hydration.  Patient was seen at the [facility].  He has been at the [facility] since early March due to the need for recovery from a left hip fracture.  Past Medical History:  Diagnosis Date  . Acid reflux    mild-states if doesnt chew food completely he begins to cough- states has weakness in muscles due to ilieus- has been doing exreci this  . Adynamic ileus ([place]) 12/2014   ? anesthesia related per pt  . BPH (benign prostatic hyperplasia)   . Complication of anesthesia    10/11/15- per pt "had [place]- in 11/16-related it to possibly anesthesia"  . H/O seasonal allergies   . Hyperlipidemia   . Hypertension     Past Surgical History:  Procedure Laterality Date  . APPENDECTOMY     approx 30 years ago/lwb  . CATARACT EXTRACTION W/PHACO Left 09/11/2012   Procedure: CATARACT EXTRACTION PHACO AND INTRAOCULAR LENS PLACEMENT (IOC);  Surgeon: Tonny Branch, MD;  Location: [facility];  Service: Ophthalmology;  Laterality: Left;  CDE: 19.52  . HIP ARTHROPLASTY Right 12/06/2014   Procedure: PARTIAL RIGHT HIP REPLACEMENT;  Surgeon: Carole Civil, MD;  Location: [facility];  Service: Orthopedics;  Laterality: Right;  . HIP ARTHROPLASTY Left 03/08/2020   Procedure: ARTHROPLASTY BIPOLAR HIP (HEMIARTHROPLASTY);  Surgeon: Mordecai Rasmussen, MD;  Location: [facility];  Service: Orthopedics;  Laterality: Left;  . LUMBAR LAMINECTOMY/DECOMPRESSION MICRODISCECTOMY N/A 10/19/2015   Procedure: CENTRAL DECOMPRESSION LUMBAR LAMINECTOMY FOR SPINAL STENOSIS, L3,L4FORAMINOTOMY BILATERAL L3,L4 NERVE ROOT;  Surgeon: Latanya Maudlin, MD;  Location: [facility];  Service: Orthopedics;  Laterality: N/A;  . UMBILICAL HERNIA REPAIR     Approx 10 years  ago/lwb  . ureteral stone extraction      Family History  Problem Relation Age of Onset  . Coronary artery disease Other        no family history of premature CAD   Social History:  reports that he has quit smoking. His smoking use included pipe. He has never used smokeless tobacco. He reports current alcohol use. He reports that he does not use drugs.  Allergies:  Allergies  Allergen Reactions  . Robaxin [Methocarbamol] Other (See Comments)    Mental confusion    Medications Prior to Admission  Medication Sig Dispense Refill  . acetaminophen (TYLENOL) 650 MG CR tablet Take 650 mg by mouth every 6 (six) hours as needed for pain.    . Amino Acids-Protein Hydrolys (FEEDING SUPPLEMENT, PRO-STAT SUGAR FREE 64,) LIQD Take 30 mLs by mouth 2 (two) times daily. 0900 & 2100    . Cholecalciferol (VITAMIN D) 125 MCG (5000 UT) CAPS Take 5,000 Units by mouth daily in the afternoon. 0900    . [EXPIRED] ciprofloxacin (CIPRO) 500 MG tablet Take 500 mg by mouth 2 (two) times daily. For Pseudomonas UTI    . cloNIDine (CATAPRES - DOSED IN MG/24 HR) 0.1 mg/24hr patch Place 0.1 mg onto the skin every Tuesday.    . feeding supplement (ENSURE ENLIVE / ENSURE PLUS) LIQD Take 237 mLs by mouth 2 (two) times daily between meals. 0900 & 2100    . fluorometholone (FML) 0.1 % ophthalmic suspension Place 1 drop into both eyes 2 (two) times daily.  0900 & 2100    . NON FORMULARY Diet NPO    . polyethylene glycol (MIRALAX / GLYCOLAX) 17 g packet Take 17 g by mouth daily as needed. (Patient taking differently: Take 17 g by mouth daily as needed for moderate constipation.) 14 each 0  . rosuvastatin (CRESTOR) 20 MG tablet Take 20 mg by mouth at bedtime. 2100    . sodium chloride 0.9 % SOLN 75 mLs by CRRT route See admin instructions. Give 75 cc per hour until feeding tube is inserted Every Shift      No results found for this or any previous visit (from the past 48 hour(s)). No results found.  Review of Systems   Constitutional: Negative.   HENT: Negative.   Eyes: Negative.   Respiratory: Negative.   Cardiovascular: Negative.   Gastrointestinal: Negative.   Endocrine: Negative.   Skin: Negative.   Allergic/Immunologic: Negative.   Hematological: Negative.     There were no vitals taken for this visit. Physical Exam Vitals reviewed.  Constitutional:      Appearance: Normal appearance. He is not ill-appearing.  HENT:     Head: Normocephalic and atraumatic.  Cardiovascular:     Rate and Rhythm: Normal rate and regular rhythm.     Heart sounds: Normal heart sounds. No murmur heard. No friction rub. No gallop.   Pulmonary:     Effort: Pulmonary effort is normal. No respiratory distress.     Breath sounds: Normal breath sounds. No stridor. No wheezing, rhonchi or rales.  Abdominal:     General: Bowel sounds are normal. There is no distension.     Palpations: Abdomen is soft. There is no mass.     Tenderness: There is no abdominal tenderness. There is no guarding or rebound.     Hernia: No hernia is present.  Skin:    General: Skin is warm and dry.  Neurological:     Mental Status: He is alert.      Assessment/Plan Impression: Dysphagia, need for gastrostomy tube placement Plan: Patient is scheduled for an EGD with PEG on 05/03/2020.  The risks and benefits of the procedure including bleeding, infection, and organ injury were fully explained to the patient, who gave informed consent.  I did also explain the procedure to the patient's wife who was at bedside.  Aviva Signs, MD 04/30/2020, 8:43 AM

## 2020-05-02 ENCOUNTER — Encounter (HOSPITAL_COMMUNITY)
Admit: 2020-05-02 | Discharge: 2020-05-02 | Disposition: A | Discharge status: Home / Self Care | Payer: Medicare Other | Attending: General Surgery | Admitting: General Surgery

## 2020-05-02 ENCOUNTER — Other Ambulatory Visit (HOSPITAL_COMMUNITY)
Admission: RE | Admit: 2020-05-02 | Discharge: 2020-05-02 | Disposition: A | Discharge status: Home / Self Care | Payer: Medicare Other | Source: Skilled Nursing Facility | Attending: Adult Health | Admitting: Adult Health

## 2020-05-02 DIAGNOSIS — N179 Acute kidney failure, unspecified: Secondary | ICD-10-CM | POA: Insufficient documentation

## 2020-05-02 LAB — BASIC METABOLIC PANEL
Anion gap: 8 (ref 5–15)
BUN: 11 mg/dL (ref 8–23)
CO2: 26 mmol/L (ref 22–32)
Calcium: 8.6 mg/dL — ABNORMAL LOW (ref 8.9–10.3)
Chloride: 106 mmol/L (ref 98–111)
Creatinine, Ser: 0.91 mg/dL (ref 0.61–1.24)
GFR, Estimated: >60 mL/min (ref >60)
Glucose, Bld: 164 mg/dL — ABNORMAL HIGH (ref 70–99)
Potassium: 3.6 mmol/L (ref 3.5–5.1)
Sodium: 140 mmol/L (ref 135–145)

## 2020-05-03 ENCOUNTER — Inpatient Hospital Stay
Admission: RE | Admit: 2020-05-03 | Discharge: 2020-05-27 | Disposition: A | Discharge status: Home / Self Care | Payer: Medicare Other | Source: Ambulatory Visit | Attending: Internal Medicine | Admitting: Internal Medicine

## 2020-05-03 ENCOUNTER — Ambulatory Visit (HOSPITAL_COMMUNITY): Payer: Medicare Other | Admitting: Certified Registered Nurse Anesthetist

## 2020-05-03 ENCOUNTER — Other Ambulatory Visit: Payer: Self-pay

## 2020-05-03 ENCOUNTER — Ambulatory Visit (HOSPITAL_COMMUNITY)
Admission: RE | Admit: 2020-05-03 | Discharge: 2020-05-03 | Disposition: A | Discharge status: Skilled Nursing Facility | Payer: Medicare Other | Attending: General Surgery | Admitting: General Surgery

## 2020-05-03 ENCOUNTER — Encounter (HOSPITAL_COMMUNITY)
Admission: RE | Disposition: A | Discharge status: Skilled Nursing Facility | Payer: Self-pay | Source: Home / Self Care | Attending: General Surgery

## 2020-05-03 ENCOUNTER — Encounter (HOSPITAL_COMMUNITY): Payer: Self-pay | Admitting: General Surgery

## 2020-05-03 DIAGNOSIS — Z20822 Contact with and (suspected) exposure to covid-19: Secondary | ICD-10-CM | POA: Insufficient documentation

## 2020-05-03 DIAGNOSIS — Z9049 Acquired absence of other specified parts of digestive tract: Secondary | ICD-10-CM | POA: Diagnosis not present

## 2020-05-03 DIAGNOSIS — R1310 Dysphagia, unspecified: Secondary | ICD-10-CM | POA: Diagnosis not present

## 2020-05-03 DIAGNOSIS — Z87891 Personal history of nicotine dependence: Secondary | ICD-10-CM | POA: Insufficient documentation

## 2020-05-03 DIAGNOSIS — K449 Diaphragmatic hernia without obstruction or gangrene: Secondary | ICD-10-CM | POA: Diagnosis not present

## 2020-05-03 DIAGNOSIS — Z888 Allergy status to other drugs, medicaments and biological substances status: Secondary | ICD-10-CM | POA: Diagnosis not present

## 2020-05-03 DIAGNOSIS — Z96643 Presence of artificial hip joint, bilateral: Secondary | ICD-10-CM | POA: Diagnosis not present

## 2020-05-03 DIAGNOSIS — Z79899 Other long term (current) drug therapy: Secondary | ICD-10-CM | POA: Insufficient documentation

## 2020-05-03 HISTORY — PX: PEG PLACEMENT: SHX5437

## 2020-05-03 HISTORY — PX: ESOPHAGOGASTRODUODENOSCOPY (EGD) WITH PROPOFOL: SHX5813

## 2020-05-03 LAB — SARS CORONAVIRUS 2 BY RT PCR (HOSPITAL ORDER, PERFORMED IN ~~LOC~~ HOSPITAL LAB): SARS Coronavirus 2: NEGATIVE

## 2020-05-03 SURGERY — ESOPHAGOGASTRODUODENOSCOPY (EGD) WITH PROPOFOL
Anesthesia: General

## 2020-05-03 MED ORDER — LACTATED RINGERS IV SOLN: INTRAVENOUS | Status: DC

## 2020-05-03 MED ORDER — LIDOCAINE HCL URETHRAL/MUCOSAL 2 % EX GEL
CUTANEOUS | Status: DC | PRN
  Administered 2020-05-03: 80 via TOPICAL

## 2020-05-03 MED ORDER — CHLORHEXIDINE GLUCONATE 0.12 % MT SOLN
15.0000 mL | Freq: Once | OROMUCOSAL | Status: AC
  Administered 2020-05-03: 15 mL via OROMUCOSAL

## 2020-05-03 MED ORDER — PROPOFOL 10 MG/ML IV BOLUS
INTRAVENOUS | Status: AC
  Filled 2020-05-03: qty 60

## 2020-05-03 MED ORDER — PHENYLEPHRINE 40 MCG/ML (10ML) SYRINGE FOR IV PUSH (FOR BLOOD PRESSURE SUPPORT)
PREFILLED_SYRINGE | INTRAVENOUS | Status: AC
  Filled 2020-05-03: qty 10

## 2020-05-03 MED ORDER — ONDANSETRON HCL 4 MG/2ML IJ SOLN
INTRAMUSCULAR | Status: DC | PRN
  Administered 2020-05-03: 4 mg via INTRAVENOUS

## 2020-05-03 MED ORDER — BACITRACIN-NEOMYCIN-POLYMYXIN 400-5-5000 EX OINT
TOPICAL_OINTMENT | CUTANEOUS | Status: DC | PRN
  Administered 2020-05-03: 1 via TOPICAL

## 2020-05-03 MED ORDER — WATER FOR IRRIGATION, STERILE IR SOLN
Status: DC | PRN
  Administered 2020-05-03: 1000 mL

## 2020-05-03 MED ORDER — PROPOFOL 10 MG/ML IV BOLUS
INTRAVENOUS | Status: DC | PRN
  Administered 2020-05-03: 100 mg via INTRAVENOUS

## 2020-05-03 MED ORDER — CHLORHEXIDINE GLUCONATE CLOTH 2 % EX PADS
6.0000 | MEDICATED_PAD | Freq: Once | CUTANEOUS | Status: AC
  Administered 2020-05-03: 6 via TOPICAL

## 2020-05-03 MED ORDER — DEXAMETHASONE SODIUM PHOSPHATE 10 MG/ML IJ SOLN
INTRAMUSCULAR | Status: DC | PRN
  Administered 2020-05-03: 6 mg via INTRAVENOUS

## 2020-05-03 MED ORDER — LIDOCAINE HCL (PF) 2 % IJ SOLN
INTRAMUSCULAR | Status: AC
  Filled 2020-05-03: qty 5

## 2020-05-03 MED ORDER — LIDOCAINE HCL (PF) 1 % IJ SOLN
INTRAMUSCULAR | Status: AC
  Filled 2020-05-03: qty 30

## 2020-05-03 MED ORDER — SUCCINYLCHOLINE CHLORIDE 200 MG/10ML IV SOSY
PREFILLED_SYRINGE | INTRAVENOUS | Status: AC
  Filled 2020-05-03: qty 10

## 2020-05-03 MED ORDER — SUCCINYLCHOLINE CHLORIDE 200 MG/10ML IV SOSY
PREFILLED_SYRINGE | INTRAVENOUS | Status: DC | PRN
  Administered 2020-05-03: 120 mg via INTRAVENOUS

## 2020-05-03 MED ORDER — ONDANSETRON HCL 4 MG/2ML IJ SOLN
INTRAMUSCULAR | Status: AC
  Filled 2020-05-03: qty 2

## 2020-05-03 MED ORDER — PHENYLEPHRINE HCL (PRESSORS) 10 MG/ML IV SOLN
INTRAVENOUS | Status: DC | PRN
  Administered 2020-05-03 (×3): 80 ug via INTRAVENOUS

## 2020-05-03 MED ORDER — CEFAZOLIN SODIUM-DEXTROSE 2-4 GM/100ML-% IV SOLN
2.0000 g | INTRAVENOUS | Status: AC
  Administered 2020-05-03: 2 g via INTRAVENOUS
  Filled 2020-05-03: qty 100

## 2020-05-03 MED ORDER — ORAL CARE MOUTH RINSE: 15.0000 mL | Freq: Once | OROMUCOSAL | Status: AC

## 2020-05-03 MED ORDER — LIDOCAINE HCL 1 % IJ SOLN
INTRAMUSCULAR | Status: DC | PRN
  Administered 2020-05-03: 3 mL

## 2020-05-03 MED ORDER — DEXAMETHASONE SODIUM PHOSPHATE 10 MG/ML IJ SOLN
INTRAMUSCULAR | Status: AC
  Filled 2020-05-03: qty 1

## 2020-05-03 MED ORDER — CHLORHEXIDINE GLUCONATE 0.12 % MT SOLN
OROMUCOSAL | Status: AC
  Filled 2020-05-03: qty 15

## 2020-05-03 SURGICAL SUPPLY — 6 items
CLOTH BEACON ORANGE TIMEOUT ST (SAFETY) ×2 IMPLANT
GLOVE SURG SS PI 7.5 STRL IVOR (GLOVE) ×2 IMPLANT
GLOVE SURG UNDER POLY LF SZ6.5 (GLOVE) ×2 IMPLANT
KIT PEG SAFETY 20FR (KITS) ×2 IMPLANT
MANIFOLD NEPTUNE II (INSTRUMENTS) ×2 IMPLANT
SPONGE DRAIN TRACH 4X4 STRL 2S (GAUZE/BANDAGES/DRESSINGS) ×1 IMPLANT

## 2020-05-03 NOTE — Transfer of Care (Signed)
Immediate Anesthesia Transfer of Care Note  Patient: Dominic Hodges  Procedure(s) Performed: ESOPHAGOGASTRODUODENOSCOPY (EGD) WITH PROPOFOL (N/A ) PERCUTANEOUS ENDOSCOPIC GASTROSTOMY (PEG) PLACEMENT (N/A )  Patient Location: PACU  Anesthesia Type:General  Level of Consciousness: awake and alert   Airway & Oxygen Therapy: Patient Spontanous Breathing and Patient connected to face mask oxygen  Post-op Assessment: Report given to RN, Post -op Vital signs reviewed and stable and Patient moving all extremities X 4  Post vital signs: Reviewed and stable  Last Vitals:  Vitals Value Taken Time  BP 141/65 05/03/20 1109  Temp    Pulse 61 05/03/20 1112  Resp 23 05/03/20 1112  SpO2 100 % 05/03/20 1112  Vitals shown include unvalidated device data.  Last Pain:  Vitals:   05/03/20 0920  TempSrc: Oral  PainSc: 0-No pain         Complications: No complications documented.

## 2020-05-03 NOTE — Anesthesia Procedure Notes (Signed)
Procedure Name: Intubation Date/Time: 05/03/2020 10:37 AM Performed by: Lyda Jester, CRNA Pre-anesthesia Checklist: Patient identified, Patient being monitored, Timeout performed, Emergency Drugs available and Suction available Patient Re-evaluated:Patient Re-evaluated prior to induction Oxygen Delivery Method: Circle System Utilized Preoxygenation: Pre-oxygenation with 100% oxygen Induction Type: IV induction Ventilation: Mask ventilation without difficulty Laryngoscope Size: Glidescope and 3 Grade View: Grade I Tube type: Oral Tube size: 7.0 mm Number of attempts: 1 Airway Equipment and Method: stylet Placement Confirmation: ETT inserted through vocal cords under direct vision,  positive ETCO2 and breath sounds checked- equal and bilateral Secured at: 21 cm Tube secured with: Tape Dental Injury: Teeth and Oropharynx as per pre-operative assessment

## 2020-05-03 NOTE — Op Note (Signed)
Patient:  Dominic Hodges  DOB:  08-15-1927  MRN:  [id number]   Preop Diagnosis: Dysphagia, need for PEG placement  Postop Diagnosis: Same  Procedure: EGD with PEG  Surgeon: Aviva Signs, MD  Anes: General endotracheal  Indications: Patient is a 87 year old white male who has been at a skilled nursing unit who was diagnosed with dysphagia.  He is also on aspiration risk.  The patient now comes to the operating room for an EGD with PEG.  The risks and benefits of the procedure including bleeding, infection, and organ injury were fully explained to the patient, who gave informed consent.  Procedure note: The patient was placed in the supine position.  After induction of general endotracheal anesthesia, a timeout was performed.  The endoscope was advanced into the second portion of the duodenum without difficulty.  The first and second portions of the duodenum were within normal limits.  No duodenal ulcers were seen.  The pylorus was noted to be widely patent.  A minimal hiatal hernia was noted.  The rest of the gastric mucosa appeared to be within normal limits.  No ulcerations were seen.  The left upper quadrant of the abdomen was prepped with Betadine.  1% Xylocaine was used for local anesthesia.  A needle catheter was advanced into the antrum under direct visualization without difficulty.  A guidewire was then placed and this was grasped with a snare.  The endoscope was then retracted.  A 20 [place] Ponsky gastrostomy tube was then attached and pulled through the mouth and up to the abdominal wall without difficulty.  The endoscope was advanced back down into the stomach and adequate positioning of the gastrostomy tube was confirmed.  The bolster was placed at the 2-1/2 cm Trust Leh.  Neosporin ointment was placed around the exit site of the gastrostomy tube.  A dressing was then applied.  The patient tolerated the procedure well.  He was extubated in the operating room and transferred to PACU in  stable condition.  Complications: None  EBL: Minimal  Specimen: None

## 2020-05-03 NOTE — Anesthesia Postprocedure Evaluation (Signed)
Anesthesia Post Note  Patient: Dominic Hodges  Procedure(s) Performed: ESOPHAGOGASTRODUODENOSCOPY (EGD) WITH PROPOFOL (N/A ) PERCUTANEOUS ENDOSCOPIC GASTROSTOMY (PEG) PLACEMENT (N/A )  Patient location during evaluation: PACU Anesthesia Type: General Level of consciousness: awake and alert and oriented Pain management: pain level controlled Vital Signs Assessment: post-procedure vital signs reviewed and stable Respiratory status: spontaneous breathing and respiratory function stable Cardiovascular status: blood pressure returned to baseline and stable Postop Assessment: no apparent nausea or vomiting Anesthetic complications: no   No complications documented.   Last Vitals:  Vitals:   05/03/20 1145 05/03/20 1232  BP: 137/88 137/88  Pulse: 75 76  Resp: 18   Temp:  (!) 36.4 C  SpO2: 97% 97%    Last Pain:  Vitals:   05/03/20 1232  TempSrc:   PainSc: 0-No pain                 Teisha Trowbridge C Ryann Leavitt

## 2020-05-03 NOTE — Interval H&P Note (Signed)
History and Physical Interval Note:  05/03/2020 8:52 AM  Dominic Hodges  has presented today for surgery, with the diagnosis of dysphagia.  The various methods of treatment have been discussed with the patient and family. After consideration of risks, benefits and other options for treatment, the patient has consented to  Procedure(s): ESOPHAGOGASTRODUODENOSCOPY (EGD) WITH PROPOFOL (N/A) PERCUTANEOUS ENDOSCOPIC GASTROSTOMY (PEG) PLACEMENT (N/A) as a surgical intervention.  The patient's history has been reviewed, patient examined, no change in status, stable for surgery.  I have reviewed the patient's chart and labs.  Questions were answered to the patient's satisfaction.     Aviva Signs

## 2020-05-03 NOTE — Discharge Instructions (Signed)
PEG Tube Home Guide A percutaneous endoscopic gastrostomy (PEG) tube is used to deliver food, medicine, and fluids directly into the stomach. The tube has a clamp, a cap, and two anchors (bolsters). One bolster keeps the tube from coming out of the stomach. The other bolster holds the tube against the abdomen. You will be taught how to use and adjust your PEG tube before you leave the hospital. You will also be taught how to care for the opening (stoma) in your abdomen. Make sure that you understand:  How to care for your PEG tube.  How to care for your stoma.  How to give yourself feedings and medicines.  When to call your health care provider for help. Supplies needed:  Soapy water.  Clean, plain water.  Clean washcloth.  Bandage (dressing). This is optional.  Syringe. How to care for a PEG tube Check your PEG tube every day. Make sure:  It is not too tight. The bolster should rest gently over the stoma.  It is in the correct position. There is a mark on the tube that shows when it is in the correct position. Adjust the tube if you need to. Cleaning your stoma Clean your stoma every day. Follow these steps: 1. Wash your hands with soap and water for at least 20 seconds. If soap and water are not available, use hand sanitizer. 2. Check the skin around the stoma for redness, rash, swelling, drainage, or extra tissue growth. If you notice any of these, call your health care provider. 3. Wash the stoma and the skin around it using a clean, soft washcloth. Clean using a circular motion, and wipe away from the stoma opening, not toward it. ? Use warm, soapy water, and only use cleansers recommended by your health care provider. ? Rinse the stoma area with plain water. ? Pat the stoma area dry. 4. Place a dressing over the stoma if your health care provider told you to do that.   Giving yourself a feeding Your health care provider will give you instructions about:  How much  nutrition and fluid you will need for each feeding.  How often to have a feeding.  Whether to take medicine in the tube by itself or with a feeding. To give yourself a feeding, follow these steps: 1. Lay out all of the equipment that you will need. 2. Make sure that the nutritional formula is at room temperature. 3. Wash your hands with soap and water for at least 20 seconds. If soap and water are not available, use hand sanitizer. 4. Position yourself so that you are upright. You will need to stay upright throughout the feeding and for at least 30 minutes after the feeding. 5. Make sure the syringe plunger is pushed in. Place the tip of the syringe in clean water, and slowly pull the plunger to bring (draw up) the water into the syringe. 6. Remove the clamp and the cap from the PEG tube. 7. Push the water out of the syringe to clean (flush) the tube. 8. If the tube is clear, draw up the formula into the syringe. Make sure to use the right amount for each feeding and add water if necessary. 9. Slowly push the formula from the syringe through the tube. 10. After the feeding, flush the tube with water. 11. Put the clamp and the cap on the tube. 12. Stay sitting up or standing up straight for at least 30 minutes.   Giving yourself medicine To give  yourself medicine, follow these steps: 1. Lay out all of the equipment that you will need. 2. If your medicine is in tablet form, crush the tablet and dissolve it in water. 3. Wash your hands with soap and water for at least 20 seconds. If soap and water are not available, use hand sanitizer. 4. Position yourself so that you are upright. You will need to stay upright while you give yourself medicine and for at least 30 minutes afterward. 5. Make sure the syringe plunger is pushed in. Place the tip of the syringe in clean water, and slowly pull the plunger to bring (draw up) the water into the syringe. 6. Remove the clamp and the cap from the PEG  tube. 7. Push the water out of the syringe to clean (flush) the tube. 8. If the tube is clear, draw up the medicine into the syringe. 9. Slowly push the medicine from the syringe through the tube. 10. Flush the tube with water. 11. Put the clamp and the cap on the tube. 12. Stay sitting up or standing up straight for at least 30 minutes. Do not take sustained release (SR) medicines through your tube. If you are unsure if your medicine is an SR medicine, ask your health care provider or pharmacist.   Contact a health care provider if you have:  Soreness, redness, or irritation around your stoma.  Abdominal pain or bloating during or after your feedings.  Nausea, constipation, or diarrhea that will not go away.  A fever.  Problems with your PEG tube. Get help right away if:  Your tube is blocked.  Your tube falls out.  You have pain around your stoma.  You are bleeding from your stoma.  Your tube is leaking.  You choke or you have trouble breathing during or after a feeding. Summary  A percutaneous endoscopic gastrostomy (PEG) tube is used to deliver food and fluids directly into the stomach.  You will be taught how to use and adjust your PEG tube. You will also be taught how to care for the stoma in your abdomen.  Your health care provider will give you instructions on how to give yourself nutritional formula and medicines through your PEG tube.  Contact your health care provider if you have a fever or soreness, redness, or irritation around your stoma.  Get help right away if your tube leaks, is blocked, or falls out. Get help right away if you have pain or bleeding around your stoma. This information is not intended to replace advice given to you by your health care provider. Make sure you discuss any questions you have with your health care provider. Document Revised: 05/08/2019 Document Reviewed: 05/08/2019 Elsevier Patient Education  2021 [employer].  PATIENT  INSTRUCTIONS POST-ANESTHESIA  IMMEDIATELY FOLLOWING SURGERY:  Do not drive or operate machinery for the first twenty four hours after surgery.  Do not make any important decisions for twenty four hours after surgery or while taking narcotic pain medications or sedatives.  If you develop intractable nausea and vomiting or a severe headache please notify your doctor immediately.  FOLLOW-UP:  Please make an appointment with your surgeon as instructed. You do not need to follow up with anesthesia unless specifically instructed to do so.  WOUND CARE INSTRUCTIONS (if applicable):  Keep a dry clean dressing on the anesthesia/puncture wound site if there is drainage.  Once the wound has quit draining you may leave it open to air.  Generally you should leave the bandage  intact for twenty four hours unless there is drainage.  If the epidural site drains for more than 36-48 hours please call the anesthesia department.  QUESTIONS?:  Please feel free to call your physician or the hospital operator if you have any questions, and they will be happy to assist you.

## 2020-05-03 NOTE — Anesthesia Preprocedure Evaluation (Addendum)
Anesthesia Evaluation  Patient identified by MRN, date of birth, ID band Patient awake    Reviewed: Allergy & Precautions, NPO status , Patient's Chart, lab work & pertinent test results  History of Anesthesia Complications (+) DIFFICULT AIRWAY and history of anesthetic complications (traumatic intubation during laminectomy)  Airway Mallampati: II  TM Distance: >3 FB Neck ROM: Full    Dental  (+) Dental Advisory Given, Teeth Intact   Pulmonary former smoker,   Aspirated last week  + rhonchi    rales    Cardiovascular Exercise Tolerance: Poor hypertension, Pt. on medications  Rhythm:Regular Rate:Normal     Neuro/Psych negative neurological ROS  negative psych ROS   GI/Hepatic GERD (dysphagia, can't lie down flat)  Medicated,  Endo/Other  negative endocrine ROS  Renal/GU Renal InsufficiencyRenal disease     Musculoskeletal   Abdominal   Peds  Hematology  (+) anemia ,   Anesthesia Other Findings   Reproductive/Obstetrics                           Anesthesia Physical Anesthesia Plan  ASA: IV  Anesthesia Plan: General   Post-op Pain Management:    Induction: Intravenous, Rapid sequence and Cricoid pressure planned  PONV Risk Score and Plan: 2 and Ondansetron  Airway Management Planned: Oral ETT  Additional Equipment:   Intra-op Plan:   Post-operative Plan: Extubation in OR  Informed Consent: I have reviewed the patients History and Physical, chart, labs and discussed the procedure including the risks, benefits and alternatives for the proposed anesthesia with the patient or authorized representative who has indicated his/her understanding and acceptance.     Dental advisory given  Plan Discussed with: CRNA and Surgeon  Anesthesia Plan Comments:        Anesthesia Quick Evaluation

## 2020-05-04 ENCOUNTER — Encounter (HOSPITAL_COMMUNITY): Payer: Self-pay | Admitting: General Surgery

## 2020-05-04 ENCOUNTER — Non-Acute Institutional Stay (SKILLED_NURSING_FACILITY): Payer: Medicare Other | Admitting: Adult Health

## 2020-05-04 DIAGNOSIS — E43 Unspecified severe protein-calorie malnutrition: Secondary | ICD-10-CM | POA: Diagnosis not present

## 2020-05-04 DIAGNOSIS — R627 Adult failure to thrive: Secondary | ICD-10-CM

## 2020-05-04 DIAGNOSIS — R1314 Dysphagia, pharyngoesophageal phase: Secondary | ICD-10-CM | POA: Diagnosis not present

## 2020-05-04 NOTE — Progress Notes (Signed)
Location:  [facility] Room Number: 671 Place of Service:  SNF (31)   CODE STATUS: full code   Allergies  Allergen Reactions  . Robaxin [Methocarbamol] Other (See Comments)    Mental confusion    Chief Complaint  Patient presents with  . Acute Visit    Follow up peg tube placement     HPI:  He has had a peg tube for his nutritional support. He is presently receiving D5NS at 75 cc per hour to maintain hydration and nutritional status. He will begin tube feeding today. There are no further reports of aspiration present. He is able to have water after oral care.   Past Medical History:  Diagnosis Date  . Acid reflux    mild-states if doesnt chew food completely he begins to cough- states has weakness in muscles due to ilieus- has been doing exreci this  . Adynamic ileus ([place]) 12/2014   ? anesthesia related per pt  . BPH (benign prostatic hyperplasia)   . Complication of anesthesia    10/11/15- per pt "had [place]- in 11/16-related it to possibly anesthesia"  . H/O seasonal allergies   . Hyperlipidemia   . Hypertension     Past Surgical History:  Procedure Laterality Date  . APPENDECTOMY     approx 30 years ago/lwb  . CATARACT EXTRACTION W/PHACO Left 09/11/2012   Procedure: CATARACT EXTRACTION PHACO AND INTRAOCULAR LENS PLACEMENT (IOC);  Surgeon: Tonny Branch, MD;  Location: [facility];  Service: Ophthalmology;  Laterality: Left;  CDE: 19.52  . ESOPHAGOGASTRODUODENOSCOPY (EGD) WITH PROPOFOL N/A 05/03/2020   Procedure: ESOPHAGOGASTRODUODENOSCOPY (EGD) WITH PROPOFOL;  Surgeon: Aviva Signs, MD;  Location: [facility];  Service: General;  Laterality: N/A;  . HIP ARTHROPLASTY Right 12/06/2014   Procedure: PARTIAL RIGHT HIP REPLACEMENT;  Surgeon: Carole Civil, MD;  Location: [facility];  Service: Orthopedics;  Laterality: Right;  . HIP ARTHROPLASTY Left 03/08/2020   Procedure: ARTHROPLASTY BIPOLAR HIP (HEMIARTHROPLASTY);  Surgeon: Mordecai Rasmussen, MD;  Location: [facility];  Service: Orthopedics;  Laterality: Left;  . LUMBAR LAMINECTOMY/DECOMPRESSION MICRODISCECTOMY N/A 10/19/2015   Procedure: CENTRAL DECOMPRESSION LUMBAR LAMINECTOMY FOR SPINAL STENOSIS, L3,L4FORAMINOTOMY BILATERAL L3,L4 NERVE ROOT;  Surgeon: Latanya Maudlin, MD;  Location: [facility];  Service: Orthopedics;  Laterality: N/A;  . PEG PLACEMENT N/A 05/03/2020   Procedure: PERCUTANEOUS ENDOSCOPIC GASTROSTOMY (PEG) PLACEMENT;  Surgeon: Aviva Signs, MD;  Location: [facility];  Service: General;  Laterality: N/A;  . UMBILICAL HERNIA REPAIR     Approx 10 years ago/lwb  . ureteral stone extraction      Social History   Socioeconomic History  . Marital status: Married    Spouse name: Not on file  . Number of children: Not on file  . Years of education: Not on file  . Highest education level: Not on file  Occupational History  . Not on file  Tobacco Use  . Smoking status: Former Smoker    Types: Pipe  . Smokeless tobacco: Never Used  Substance and Sexual Activity  . Alcohol use: Yes    Comment: occasional social drink/lwb  . Drug use: No  . Sexual activity: Not on file  Other Topics Concern  . Not on file  Social History Narrative  . Not on file   Social Determinants of Health   Financial Resource Strain: Not on file  Food Insecurity: Not on file  Transportation Needs: Not on file  Physical Activity: Not on file  Stress: Not  on file  Social Connections: Not on file  Intimate Partner Violence: Not on file   Family History  Problem Relation Age of Onset  . Coronary artery disease Other        no family history of premature CAD      VITAL SIGNS BP 123/70   Pulse 68   Temp 98.1 F (36.7 C)   Resp 18   Ht 6' 0.5" (1.842 m)   Wt 140 lb (63.5 kg)   BMI 18.73 kg/m   Outpatient Encounter Medications as of 05/04/2020  Medication Sig  . acetaminophen (TYLENOL) 160 MG/5ML elixir Place 325 mg into feeding tube every 6 (six) hours as needed for fever. Give  650 mg  . Amino Acids-Protein Hydrolys (FEEDING SUPPLEMENT, PRO-STAT SUGAR FREE 64,) LIQD Place 30 mLs into feeding tube 2 (two) times daily. 0900 & 2100  . Cholecalciferol (VITAMIN D) 125 MCG (5000 UT) CAPS 5,000 Units by PEG Tube route daily in the afternoon. 0900  . cloNIDine (CATAPRES - DOSED IN MG/24 HR) 0.1 mg/24hr patch Place 0.1 mg onto the skin every Tuesday.  . feeding supplement (ENSURE ENLIVE / ENSURE PLUS) LIQD Take 237 mLs by mouth 2 (two) times daily between meals. 0900 & 2100  . fluorometholone (FML) 0.1 % ophthalmic suspension Place 1 drop into both eyes 2 (two) times daily. 0900 & 2100  . NON FORMULARY Diet NPO  . polyethylene glycol (MIRALAX / GLYCOLAX) 17 g packet Take 17 g by mouth daily as needed. (Patient taking differently: Place 17 g into feeding tube daily as needed.)  . rosuvastatin (CRESTOR) 20 MG tablet Place 20 mg into feeding tube at bedtime. 2100  . sodium chloride 0.9 % SOLN 75 mLs by CRRT route See admin instructions. Give 75 cc per hour until feeding tube is inserted Every Shift  . [DISCONTINUED] acetaminophen (TYLENOL) 650 MG CR tablet Take 650 mg by mouth every 6 (six) hours as needed for pain.   No facility-administered encounter medications on file as of 05/04/2020.     SIGNIFICANT DIAGNOSTIC EXAMS   PREVIOUS   03-07-20: left hip x-ray:  1. Comminuted left femoral neck fracture, extending from the mid left femoral neck through the greater trochanter. Significant impaction and varus angulation.  NO NEW EXAMS.   LABS REVIEWED PREVIOUS   03-07-20: wbc 8.5; hgb 13.0; hct 39.9; mcv 96.4 plt 183; glucose 108; bun 30; creat 1.33; k+4.0; na++ 142; ca 8.9 GFR 51 03-08-20: wbc 10.5; hgb 12.2; hct 36.4 mcv 95.5 plt 177; glucose 116; bun 27; creat 1.13; k+ 4.1; na++ 140; ca 8.6 GFR>60; liver normal albumin 3.7 03-10-20; wbc 10.0; hgb 8.9; hct 27.2; mcv 97.1 plt 129; glucose 101; bun 40; creat 2.00; k+ 4.1; na++ 135; ca 7.6; GFR 31 03-11-20: glucose 105; bun 41; creat  1.84; k+ 3.5; na++ 137; ca 7.6 GFR 34; phos 3.0 albumin 2.7  04-15-20: wbc 10.0; hgb 13.0; hct 41.2; mcv 93.9 plt 246; glucose 108; bun 41; creat 1.17; k+ 4.2; na++ 143; ca 8.9 GFR59; liver normal albumin 3.6    TODAY  04-21-20: wbc 8.5; hgb 11.9; hct 38.5; mcv 92.8 plt 243; glucose 100; bun 39; creat 1.11; k+ 3.9; na++ 142; ca 8.9; GFR>60; liver normal albumin 3.4 tsh 4.342 vit B 12: 421; folate 14.2 05-02-20: glucose 164; bun 11; creat 0.91; k+ 3.6; na++ 140; ca 8.6 GFR>60   Review of Systems  Constitutional: Negative for malaise/fatigue.  Respiratory: Negative for cough and shortness of breath.   Cardiovascular: Negative  for chest pain, palpitations and leg swelling.  Gastrointestinal: Negative for abdominal pain, constipation and heartburn.  Musculoskeletal: Negative for back pain, joint pain and myalgias.  Skin: Negative.   Neurological: Negative for dizziness.  Psychiatric/Behavioral: The patient is not nervous/anxious.      Physical Exam Constitutional:      General: He is not in acute distress.    Appearance: He is underweight. He is not diaphoretic.  Neck:     Thyroid: No thyromegaly.  Cardiovascular:     Rate and Rhythm: Normal rate and regular rhythm.     Pulses: Normal pulses.     Heart sounds: Normal heart sounds.  Pulmonary:     Effort: Pulmonary effort is normal. No respiratory distress.     Breath sounds: Normal breath sounds.  Abdominal:     General: Bowel sounds are normal. There is no distension.     Palpations: Abdomen is soft.     Tenderness: There is no abdominal tenderness.     Comments: Peg tube present (05-03-20)  Musculoskeletal:     Cervical back: Neck supple.     Right lower leg: No edema.     Left lower leg: No edema.     Comments:  Is able to move all extremities Status post left hip arthroplasty 03-08-20     Lymphadenopathy:     Cervical: No cervical adenopathy.  Skin:    General: Skin is warm and dry.  Neurological:     Mental Status: He is  alert. Mental status is at baseline.  Psychiatric:        Mood and Affect: Mood normal.       ASSESSMENT/ PLAN:  TODAY  1.  Pharyngoesophageal dysphagia 2. Failure to thrive in adult 3. Protein calorie malnutrition severe  Will continue IV fluids until tube feeding begins to maintain hydration and nutrition Will change his medications to peg tube Will continue to monitor his status.       Ok Edwards NP [facility]  Contact [phone] Monday through Friday 8am- 5pm  After hours call [phone]

## 2020-05-10 ENCOUNTER — Institutional Professional Consult (permissible substitution): Payer: Medicare Other | Admitting: Internal Medicine

## 2020-05-10 NOTE — Progress Notes (Deleted)
   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    Dyspnea:  *** Cough: *** Sleep: *** SABA use:   Past Medical History:  Diagnosis Date  . Acid reflux    mild-states if doesnt chew food completely he begins to cough- states has weakness in muscles due to ilieus- has been doing exreci this  . Adynamic ileus ([place]) 12/2014   ? anesthesia related per pt  . BPH (benign prostatic hyperplasia)   . Complication of anesthesia    10/11/15- per pt "had [place]- in 11/16-related it to possibly anesthesia"  . H/O seasonal allergies   . Hyperlipidemia   . Hypertension     Outpatient Medications Prior to Visit  Medication Sig Dispense Refill  . acetaminophen (TYLENOL) 160 MG/5ML elixir Place 325 mg into feeding tube every 6 (six) hours as needed for fever. Give 650 mg    . Amino Acids-Protein Hydrolys (FEEDING SUPPLEMENT, PRO-STAT SUGAR FREE 64,) LIQD Place 30 mLs into feeding tube 2 (two) times daily. 0900 & 2100    . Cholecalciferol (VITAMIN D) 125 MCG (5000 UT) CAPS 5,000 Units by PEG Tube route daily in the afternoon. 0900    . cloNIDine (CATAPRES - DOSED IN MG/24 HR) 0.1 mg/24hr patch Place 0.1 mg onto the skin every Tuesday.    . feeding supplement (ENSURE ENLIVE / ENSURE PLUS) LIQD Take 237 mLs by mouth 2 (two) times daily between meals. 0900 & 2100    . fluorometholone (FML) 0.1 % ophthalmic suspension Place 1 drop into both eyes 2 (two) times daily. 0900 & 2100    . NON FORMULARY Diet NPO    . polyethylene glycol (MIRALAX / GLYCOLAX) 17 g packet Take 17 g by mouth daily as needed. (Patient taking differently: Place 17 g into feeding tube daily as needed.) 14 each 0  . rosuvastatin (CRESTOR) 20 MG tablet Place 20 mg into feeding tube at  bedtime. 2100    . sodium chloride 0.9 % SOLN 75 mLs by CRRT route See admin instructions. Give 75 cc per hour until feeding tube is inserted Every Shift     No facility-administered medications prior to visit.     Objective:     There were no vitals taken for this visit.         Assessment   No problem-specific Assessment & Plan notes found for this encounter.     Christinia Gully, MD 05/10/2020

## 2020-05-16 ENCOUNTER — Other Ambulatory Visit: Payer: Self-pay | Admitting: *Deleted

## 2020-05-16 NOTE — Patient Outreach (Signed)
[facility] Post- Acute Care Coordinator follow up. Member screened for potential [facility] Care Management needs.  Update received from [place] indicating member's transition plan is to return home.   Will continue to follow for potential [facility] Care Management services.    Marthenia Rolling, MSN, RN,BSN [place] Acute Care Coordinator [phone] [facility]) [phone]  (Toll free office)

## 2020-05-19 ENCOUNTER — Non-Acute Institutional Stay (SKILLED_NURSING_FACILITY): Payer: Medicare Other | Admitting: Adult Health

## 2020-05-19 ENCOUNTER — Encounter: Payer: Self-pay | Admitting: Adult Health

## 2020-05-19 DIAGNOSIS — E43 Unspecified severe protein-calorie malnutrition: Secondary | ICD-10-CM | POA: Diagnosis not present

## 2020-05-19 DIAGNOSIS — S72032S Displaced midcervical fracture of left femur, sequela: Secondary | ICD-10-CM | POA: Diagnosis not present

## 2020-05-19 DIAGNOSIS — N1831 Chronic kidney disease, stage 3a: Secondary | ICD-10-CM | POA: Diagnosis not present

## 2020-05-19 DIAGNOSIS — R1314 Dysphagia, pharyngoesophageal phase: Secondary | ICD-10-CM | POA: Diagnosis not present

## 2020-05-19 NOTE — Progress Notes (Signed)
Location:  [facility] Room Number: 131-P Place of Service:  SNF (31)   CODE STATUS: Full Code  Allergies  Allergen Reactions  . Robaxin [Methocarbamol] Other (See Comments)    Mental confusion    Chief Complaint  Patient presents with  . Acute Visit    Care plan meeting     HPI:  We have come together for his care plan meeting family present  BIMS 15/15 mood: 3/30: restless at times. He is nonambulatory he requires extensive assist with his adls. His is NPO; is on bolus tube feedings per peg tube. There have been no falls. Incontinent of bladder and bowel.    Therapy stand pivot: contact guard; bed mobility contact guard; ambulate 30 feet with contact guard then he shuffles; min assist for 4 steps; upper body min assist; lower body max assist; BPR mod to max assist. Will need to practice car transfer on 05-23-20.  Speech therapy for voice; clear saliva.   We have discussed his tube feeding options; including 24 hour; 12 hour and to continue bolus. He and his family will continue with bolus feedings.      He will continue to be followed for his chronic illnesses including:  Protein calorie malnutrition severe   Stage 3a chronic kidney disease   Pharyngoesophageal dysphagia  Closed displaced midcervical fracture of left femur sequela   Past Medical History:  Diagnosis Date  . Acid reflux    mild-states if doesnt chew food completely he begins to cough- states has weakness in muscles due to ilieus- has been doing exreci this  . Adynamic ileus ([place]) 12/2014   ? anesthesia related per pt  . BPH (benign prostatic hyperplasia)   . Complication of anesthesia    10/11/15- per pt "had [place]- in 11/16-related it to possibly anesthesia"  . H/O seasonal allergies   . Hyperlipidemia   . Hypertension     Past Surgical History:  Procedure Laterality Date  . APPENDECTOMY     approx 30 years ago/lwb  . CATARACT EXTRACTION W/PHACO Left  09/11/2012   Procedure: CATARACT EXTRACTION PHACO AND INTRAOCULAR LENS PLACEMENT (IOC);  Surgeon: Tonny Branch, MD;  Location: [facility];  Service: Ophthalmology;  Laterality: Left;  CDE: 19.52  . ESOPHAGOGASTRODUODENOSCOPY (EGD) WITH PROPOFOL N/A 05/03/2020   Procedure: ESOPHAGOGASTRODUODENOSCOPY (EGD) WITH PROPOFOL;  Surgeon: Aviva Signs, MD;  Location: [facility];  Service: General;  Laterality: N/A;  . HIP ARTHROPLASTY Right 12/06/2014   Procedure: PARTIAL RIGHT HIP REPLACEMENT;  Surgeon: Carole Civil, MD;  Location: [facility];  Service: Orthopedics;  Laterality: Right;  . HIP ARTHROPLASTY Left 03/08/2020   Procedure: ARTHROPLASTY BIPOLAR HIP (HEMIARTHROPLASTY);  Surgeon: Mordecai Rasmussen, MD;  Location: [facility];  Service: Orthopedics;  Laterality: Left;  . LUMBAR LAMINECTOMY/DECOMPRESSION MICRODISCECTOMY N/A 10/19/2015   Procedure: CENTRAL DECOMPRESSION LUMBAR LAMINECTOMY FOR SPINAL STENOSIS, L3,L4FORAMINOTOMY BILATERAL L3,L4 NERVE ROOT;  Surgeon: Latanya Maudlin, MD;  Location: [facility];  Service: Orthopedics;  Laterality: N/A;  . PEG PLACEMENT N/A 05/03/2020   Procedure: PERCUTANEOUS ENDOSCOPIC GASTROSTOMY (PEG) PLACEMENT;  Surgeon: Aviva Signs, MD;  Location: [facility];  Service: General;  Laterality: N/A;  . UMBILICAL HERNIA REPAIR     Approx 10 years ago/lwb  . ureteral stone extraction      Social History   Socioeconomic History  . Marital status: Married    Spouse name: Not on file  . Number of children: Not on file  . Years of education: Not on  file  . Highest education level: Not on file  Occupational History  . Not on file  Tobacco Use  . Smoking status: Former Smoker    Types: Pipe  . Smokeless tobacco: Never Used  Substance and Sexual Activity  . Alcohol use: Yes    Comment: occasional social drink/lwb  . Drug use: No  . Sexual activity: Not on file  Other Topics Concern  . Not on file  Social History Narrative  . Not on file   Social Determinants of Health   Financial  Resource Strain: Not on file  Food Insecurity: Not on file  Transportation Needs: Not on file  Physical Activity: Not on file  Stress: Not on file  Social Connections: Not on file  Intimate Partner Violence: Not on file   Family History  Problem Relation Age of Onset  . Coronary artery disease Other        no family history of premature CAD      VITAL SIGNS BP (!) 113/57   Pulse 75   Temp 99.4 F (37.4 C)   Resp 20   Ht 6\' 6"  (1.981 m)   Wt 140 lb 9.6 oz (63.8 kg)   SpO2 97%   BMI 16.25 kg/m   Outpatient Encounter Medications as of 05/19/2020  Medication Sig  . acetaminophen (TYLENOL) 160 MG/5ML elixir Place 325 mg into feeding tube every 6 (six) hours as needed for fever. Give 650 mg  . Balsam [place]-Castor Oil (VENELEX) OINT Apply topically 3 (three) times daily. Topical, Every Shift, Apply to sacrum, coccyx and bilateral buttocks qshift for prevention.  . Cholecalciferol (VITAMIN D) 125 MCG (5000 UT) CAPS 5,000 Units by PEG Tube route daily in the afternoon. 0900  . cloNIDine (CATAPRES - DOSED IN MG/24 HR) 0.1 mg/24hr patch Place 0.1 mg onto the skin every Tuesday.  . fluorometholone (FML) 0.1 % ophthalmic suspension Place 1 drop into both eyes 2 (two) times daily. 0900 & 2100  . NON FORMULARY Diet NPO  . ondansetron (ZOFRAN) 4 MG tablet Take 4 mg by mouth every 6 (six) hours as needed for nausea or vomiting. 1 tab, gastric tube, Every 6 Hours - PRN, for nausea  . polyethylene glycol (MIRALAX / GLYCOLAX) 17 g packet Take 17 g by mouth daily as needed. (Patient taking differently: Place 17 g into feeding tube daily as needed.)  . rosuvastatin (CRESTOR) 20 MG tablet Place 20 mg into feeding tube at bedtime. 2100  . [DISCONTINUED] Amino Acids-Protein Hydrolys (FEEDING SUPPLEMENT, PRO-STAT SUGAR FREE 64,) LIQD Place 30 mLs into feeding tube 2 (two) times daily. 0900 & 2100  . [DISCONTINUED] feeding supplement (ENSURE ENLIVE / ENSURE PLUS) LIQD Take 237 mLs by mouth 2 (two) times  daily between meals. 0900 & 2100  . [DISCONTINUED] sodium chloride 0.9 % SOLN 75 mLs by CRRT route See admin instructions. Give 75 cc per hour until feeding tube is inserted Every Shift   No facility-administered encounter medications on file as of 05/19/2020.     SIGNIFICANT DIAGNOSTIC EXAMS   PREVIOUS   03-07-20: left hip x-ray:  1. Comminuted left femoral neck fracture, extending from the mid left femoral neck through the greater trochanter. Significant impaction and varus angulation.  NO NEW EXAMS.   LABS REVIEWED PREVIOUS   03-07-20: wbc 8.5; hgb 13.0; hct 39.9; mcv 96.4 plt 183; glucose 108; bun 30; creat 1.33; k+4.0; na++ 142; ca 8.9 GFR 51 03-08-20: wbc 10.5; hgb 12.2; hct 36.4 mcv 95.5 plt 177; glucose 116;  bun 27; creat 1.13; k+ 4.1; na++ 140; ca 8.6 GFR>60; liver normal albumin 3.7 03-10-20; wbc 10.0; hgb 8.9; hct 27.2; mcv 97.1 plt 129; glucose 101; bun 40; creat 2.00; k+ 4.1; na++ 135; ca 7.6; GFR 31 03-11-20: glucose 105; bun 41; creat 1.84; k+ 3.5; na++ 137; ca 7.6 GFR 34; phos 3.0 albumin 2.7  04-15-20: wbc 10.0; hgb 13.0; hct 41.2; mcv 93.9 plt 246; glucose 108; bun 41; creat 1.17; k+ 4.2; na++ 143; ca 8.9 GFR59; liver normal albumin 3.6   04-21-20: wbc 8.5; hgb 11.9; hct 38.5; mcv 92.8 plt 243; glucose 100; bun 39; creat 1.11; k+ 3.9; na++ 142; ca 8.9; GFR>60; liver normal albumin 3.4 tsh 4.342 vit B 12: 421; folate 14.2 05-02-20: glucose 164; bun 11; creat 0.91; k+ 3.6; na++ 140; ca 8.6 GFR>60  NO NEW LABS.   Review of Systems  Constitutional: Negative for malaise/fatigue.  Respiratory: Negative for cough and shortness of breath.   Cardiovascular: Negative for chest pain, palpitations and leg swelling.  Gastrointestinal: Negative for abdominal pain, constipation and heartburn.       Difficulty swallowing  Musculoskeletal: Negative for back pain, joint pain and myalgias.  Skin: Negative.   Neurological: Negative for dizziness.  Psychiatric/Behavioral: The patient is not  nervous/anxious.     Physical Exam Constitutional:      General: He is not in acute distress.    Appearance: He is underweight. He is not diaphoretic.  Neck:     Thyroid: No thyromegaly.  Cardiovascular:     Rate and Rhythm: Normal rate and regular rhythm.     Pulses: Normal pulses.     Heart sounds: Normal heart sounds.  Pulmonary:     Effort: Pulmonary effort is normal. No respiratory distress.     Breath sounds: Normal breath sounds.  Abdominal:     General: Bowel sounds are normal. There is no distension.     Palpations: Abdomen is soft.     Tenderness: There is no abdominal tenderness.     Comments: Peg tube present (05-03-20)   Musculoskeletal:        General: Normal range of motion.     Cervical back: Neck supple.     Right lower leg: No edema.     Left lower leg: No edema.     Comments: Is able to move all extremities Status post left hip arthroplasty 03-08-20      Lymphadenopathy:     Cervical: No cervical adenopathy.  Skin:    General: Skin is warm and dry.  Neurological:     Mental Status: He is alert. Mental status is at baseline.  Psychiatric:        Mood and Affect: Mood normal.      ASSESSMENT/ PLAN:  TODAY  1. Protein calorie malnutrition severe 2. Stage 3a chronic kidney disease 3. Pharyngoesophageal dysphagia  4. Closed displaced midcervical fracture of left femur sequela  Will contineu current medications Will continue current plan of care Will continue therapy therapy as directed Goal of care is: to return home. He will need to be stronger prior to discharge.   Time spent with family and patient: 45 minutes: counseling: coordination to include medications; goals of care; feeding;    Ok Edwards NP [facility]  Contact [phone] Monday through Friday 8am- 5pm  After hours call [phone]

## 2020-05-24 ENCOUNTER — Encounter: Payer: Self-pay | Admitting: Adult Health

## 2020-05-24 ENCOUNTER — Encounter: Payer: Self-pay | Admitting: Orthopedic Surgery

## 2020-05-24 ENCOUNTER — Other Ambulatory Visit: Payer: Self-pay | Admitting: Adult Health

## 2020-05-24 ENCOUNTER — Ambulatory Visit (INDEPENDENT_AMBULATORY_CARE_PROVIDER_SITE_OTHER): Payer: Medicare Other | Admitting: Orthopedic Surgery

## 2020-05-24 ENCOUNTER — Inpatient Hospital Stay: Payer: Medicare Other

## 2020-05-24 ENCOUNTER — Non-Acute Institutional Stay (SKILLED_NURSING_FACILITY): Payer: Medicare Other | Admitting: Adult Health

## 2020-05-24 VITALS — Ht 77.0 in | Wt 136.0 lb

## 2020-05-24 DIAGNOSIS — S72032D Displaced midcervical fracture of left femur, subsequent encounter for closed fracture with routine healing: Secondary | ICD-10-CM

## 2020-05-24 DIAGNOSIS — N1831 Chronic kidney disease, stage 3a: Secondary | ICD-10-CM | POA: Diagnosis not present

## 2020-05-24 DIAGNOSIS — S72032S Displaced midcervical fracture of left femur, sequela: Secondary | ICD-10-CM | POA: Diagnosis not present

## 2020-05-24 DIAGNOSIS — E43 Unspecified severe protein-calorie malnutrition: Secondary | ICD-10-CM | POA: Diagnosis not present

## 2020-05-24 DIAGNOSIS — Z96642 Presence of left artificial hip joint: Secondary | ICD-10-CM

## 2020-05-24 DIAGNOSIS — R1314 Dysphagia, pharyngoesophageal phase: Secondary | ICD-10-CM

## 2020-05-24 MED ORDER — ROSUVASTATIN CALCIUM 20 MG PO TABS: 20.0000 mg | ORAL_TABLET | Freq: Every evening | ORAL | 0 refills | Status: DC

## 2020-05-24 MED ORDER — CLONIDINE 0.1 MG/24HR TD PTWK: 0.1000 mg | MEDICATED_PATCH | TRANSDERMAL | 0 refills | Status: DC

## 2020-05-24 MED ORDER — FLUOROMETHOLONE 0.1 % OP SUSP: 1.0000 [drp] | Freq: Two times a day (BID) | OPHTHALMIC | 0 refills | Status: DC

## 2020-05-24 MED ORDER — ONDANSETRON HCL 4 MG PO TABS: 4.0000 mg | ORAL_TABLET | Freq: Four times a day (QID) | ORAL | 0 refills | Status: DC | PRN

## 2020-05-24 NOTE — Progress Notes (Signed)
Location:    [facility] Room Number: 131-P Place of Service:  SNF (31)    CODE STATUS: Full Code   Allergies  Allergen Reactions  . Robaxin [Methocarbamol] Other (See Comments)    Mental confusion    Chief Complaint  Patient presents with  . Discharge Note    Discharge from [facility]    HPI:  He is being discharged to home with home health for pt/ot/rn/cna. He will need a wheelchair. He will need his prescriptions written and will need to follow up with his primary care provider. He had been initially hospitalized for a left hip fracture with hemiarthroplasty in early March of this year. He was admitted here for short term rehab. He had participated in therapy and had a slow progression. He has a history of dysphagia for which he had a modified barium swallow. He failed this exam; was placed NPO. He decided to have a peg tube placed done on 05-03-20. He was started on bolus feedings; he is tolerating them without difficulty. He and his family have been educated on how to give him feedings. He is now ready to complete his therapy on a home health basis.    Past Medical History:  Diagnosis Date  . Acid reflux    mild-states if doesnt chew food completely he begins to cough- states has weakness in muscles due to ilieus- has been doing exreci this  . Adynamic ileus ([place]) 12/2014   ? anesthesia related per pt  . BPH (benign prostatic hyperplasia)   . Complication of anesthesia    10/11/15- per pt "had [place]- in 11/16-related it to possibly anesthesia"  . H/O seasonal allergies   . Hyperlipidemia   . Hypertension     Past Surgical History:  Procedure Laterality Date  . APPENDECTOMY     approx 30 years ago/lwb  . CATARACT EXTRACTION W/PHACO Left 09/11/2012   Procedure: CATARACT EXTRACTION PHACO AND INTRAOCULAR LENS PLACEMENT (IOC);  Surgeon: Tonny Branch, MD;  Location: [facility];  Service: Ophthalmology;  Laterality: Left;   CDE: 19.52  . ESOPHAGOGASTRODUODENOSCOPY (EGD) WITH PROPOFOL N/A 05/03/2020   Procedure: ESOPHAGOGASTRODUODENOSCOPY (EGD) WITH PROPOFOL;  Surgeon: Aviva Signs, MD;  Location: [facility];  Service: General;  Laterality: N/A;  . HIP ARTHROPLASTY Right 12/06/2014   Procedure: PARTIAL RIGHT HIP REPLACEMENT;  Surgeon: Carole Civil, MD;  Location: [facility];  Service: Orthopedics;  Laterality: Right;  . HIP ARTHROPLASTY Left 03/08/2020   Procedure: ARTHROPLASTY BIPOLAR HIP (HEMIARTHROPLASTY);  Surgeon: Mordecai Rasmussen, MD;  Location: [facility];  Service: Orthopedics;  Laterality: Left;  . LUMBAR LAMINECTOMY/DECOMPRESSION MICRODISCECTOMY N/A 10/19/2015   Procedure: CENTRAL DECOMPRESSION LUMBAR LAMINECTOMY FOR SPINAL STENOSIS, L3,L4FORAMINOTOMY BILATERAL L3,L4 NERVE ROOT;  Surgeon: Latanya Maudlin, MD;  Location: [facility];  Service: Orthopedics;  Laterality: N/A;  . PEG PLACEMENT N/A 05/03/2020   Procedure: PERCUTANEOUS ENDOSCOPIC GASTROSTOMY (PEG) PLACEMENT;  Surgeon: Aviva Signs, MD;  Location: [facility];  Service: General;  Laterality: N/A;  . UMBILICAL HERNIA REPAIR     Approx 10 years ago/lwb  . ureteral stone extraction      Social History   Socioeconomic History  . Marital status: Married    Spouse name: Not on file  . Number of children: Not on file  . Years of education: Not on file  . Highest education level: Not on file  Occupational History  . Not on file  Tobacco Use  . Smoking status: Former Smoker  Types: Pipe  . Smokeless tobacco: Never Used  Vaping Use  . Vaping Use: Never used  Substance and Sexual Activity  . Alcohol use: Yes    Comment: occasional social drink/lwb  . Drug use: No  . Sexual activity: Not on file  Other Topics Concern  . Not on file  Social History Narrative  . Not on file   Social Determinants of Health   Financial Resource Strain: Not on file  Food Insecurity: Not on file  Transportation Needs: Not on file  Physical Activity: Not on file  Stress:  Not on file  Social Connections: Not on file  Intimate Partner Violence: Not on file   Family History  Problem Relation Age of Onset  . Coronary artery disease Other        no family history of premature CAD    VITAL SIGNS BP (!) 127/59   Pulse 79   Resp 20   Ht 6\' 5"  (1.956 m)   Wt 136 lb 6.4 oz (61.9 kg)   SpO2 97%   BMI 16.17 kg/m   Patient's Medications  New Prescriptions   No medications on file  Previous Medications   ACETAMINOPHEN (TYLENOL) 160 MG/5ML ELIXIR    Place 325 mg into feeding tube every 6 (six) hours as needed for fever. Give 650 mg   BALSAM PERU-CASTOR OIL (VENELEX) OINT    Apply topically 3 (three) times daily. Topical, Every Shift, Apply to sacrum, coccyx and bilateral buttocks qshift for prevention.   CHOLECALCIFEROL (VITAMIN D) 125 MCG (5000 UT) CAPS    5,000 Units by PEG Tube route daily in the afternoon. 0900   CLONIDINE (CATAPRES - DOSED IN MG/24 HR) 0.1 MG/24HR PATCH    Place 0.1 mg onto the skin every Friday.   FLUOROMETHOLONE (FML) 0.1 % OPHTHALMIC SUSPENSION    Place 1 drop into both eyes 2 (two) times daily. 0900 & 2100   NON FORMULARY    Diet NPO   ONDANSETRON (ZOFRAN) 4 MG TABLET    Take 4 mg by mouth every 6 (six) hours as needed for nausea or vomiting. 1 tab, gastric tube, Every 6 Hours - PRN, for nausea   POLYETHYLENE GLYCOL (MIRALAX / GLYCOLAX) 17 G PACKET    Place 17 g into feeding tube daily as needed.   ROSUVASTATIN (CRESTOR) 20 MG TABLET    Place 20 mg into feeding tube at bedtime. 2100  Modified Medications   No medications on file  Discontinued Medications   POLYETHYLENE GLYCOL (MIRALAX / GLYCOLAX) 17 G PACKET    Take 17 g by mouth daily as needed.     SIGNIFICANT DIAGNOSTIC EXAMS  PREVIOUS   03-07-20: left hip x-ray:  1. Comminuted left femoral neck fracture, extending from the mid left femoral neck through the greater trochanter. Significant impaction and varus angulation.  NO NEW EXAMS.   LABS REVIEWED PREVIOUS   03-07-20:  wbc 8.5; hgb 13.0; hct 39.9; mcv 96.4 plt 183; glucose 108; bun 30; creat 1.33; k+4.0; na++ 142; ca 8.9 GFR 51 03-08-20: wbc 10.5; hgb 12.2; hct 36.4 mcv 95.5 plt 177; glucose 116; bun 27; creat 1.13; k+ 4.1; na++ 140; ca 8.6 GFR>60; liver normal albumin 3.7 03-10-20; wbc 10.0; hgb 8.9; hct 27.2; mcv 97.1 plt 129; glucose 101; bun 40; creat 2.00; k+ 4.1; na++ 135; ca 7.6; GFR 31 03-11-20: glucose 105; bun 41; creat 1.84; k+ 3.5; na++ 137; ca 7.6 GFR 34; phos 3.0 albumin 2.7  04-15-20: wbc 10.0; hgb 13.0; hct  41.2; mcv 93.9 plt 246; glucose 108; bun 41; creat 1.17; k+ 4.2; na++ 143; ca 8.9 GFR59; liver normal albumin 3.6   04-21-20: wbc 8.5; hgb 11.9; hct 38.5; mcv 92.8 plt 243; glucose 100; bun 39; creat 1.11; k+ 3.9; na++ 142; ca 8.9; GFR>60; liver normal albumin 3.4 tsh 4.342 vit B 12: 421; folate 14.2 05-02-20: glucose 164; bun 11; creat 0.91; k+ 3.6; na++ 140; ca 8.6 GFR>60  NO NEW LABS.   Review of Systems  Constitutional: Negative for malaise/fatigue.  Respiratory: Negative for cough and shortness of breath.   Cardiovascular: Negative for chest pain, palpitations and leg swelling.  Gastrointestinal: Negative for abdominal pain, constipation and heartburn.  Musculoskeletal: Negative for back pain, joint pain and myalgias.  Skin: Negative.   Neurological: Negative for dizziness.  Psychiatric/Behavioral: The patient is not nervous/anxious.     Physical Exam Constitutional:      General: He is not in acute distress.    Appearance: He is well-developed. He is not diaphoretic.  Neck:     Thyroid: No thyromegaly.  Cardiovascular:     Rate and Rhythm: Normal rate and regular rhythm.     Heart sounds: Normal heart sounds.  Pulmonary:     Effort: Pulmonary effort is normal. No respiratory distress.     Breath sounds: Normal breath sounds.  Abdominal:     General: Bowel sounds are normal. There is no distension.     Palpations: Abdomen is soft.     Tenderness: There is no abdominal tenderness.      Comments: Peg tube placedt (05-03-20)    Musculoskeletal:     Cervical back: Neck supple.     Right lower leg: No edema.     Left lower leg: No edema.     Comments:  Is able to move all extremities Status post left hip arthroplasty 03-08-20       Lymphadenopathy:     Cervical: No cervical adenopathy.  Skin:    General: Skin is warm and dry.  Neurological:     Mental Status: He is alert. Mental status is at baseline.  Psychiatric:        Mood and Affect: Mood normal.       ASSESSMENT/ PLAN:   Patient is being discharged with the following home health services:pt/ot/rn/cna: to evaluate and treat as indicated for gait balance strength adl training; adl care; peg tube; dysphagia education     Patient is being discharged with the following durable medical equipment:  He requires use of a wheelchair in order for him to maintain his current level of independence with his adls which cannot be achieved with a cane; crutches; walker. He is able to self propel wheelchair   Patient has been advised to f/u with their PCP in 1-2 weeks to bring them up to date on their rehab stay.  Social services at facility was responsible for arranging this appointment.  Pt was provided with a 30 day supply of prescriptions for medications and refills must be obtained from their PCP.  For controlled substances, a more limited supply may be provided adequate until PCP appointment only.   A 30 day supply of his prescription medications have been sent to [facility]   Time spent with patient 45 minutes: home health; dme; medications.     Ok Edwards NP [facility]  Contact [phone] Monday through Friday 8am- 5pm  After hours call [phone]

## 2020-05-24 NOTE — Progress Notes (Signed)
Orthopaedic Postop Note  Assessment: Dominic Hodges is a 87 y.o. male s/p left hip hemiarthroplasty with tension band construct of a greater trochanter fracture   DOS: 03/08/2020  Plan: Patient continues to improve overall.  He looks more alert in clinic today.  His strength is improving.  He is able to ambulate more freely, but still requires considerable assistance.  X-rays demonstrate excellent position of the hemiarthroplasty, without interval displacement of the greater trochanter fracture.  On physical exam, he tolerates gentle motion of the left hip, without tenderness to palpation over the lateral aspect of his hip.  Continue with weightbearing as tolerated.  Encouraged him to walk as much as possible, as this will continue to improve his strength.   Follow-up: Return in about 3 months (around 08/24/2020). XR at next visit: [facility] pelvis and left hip  Subjective:  Chief Complaint  Patient presents with  . Routine Post Op    S/p Left hip hemiarthroplasty 03/08/20    History of Present Illness: Dominic Hodges is a 87 y.o. male who presents following the above stated procedure.  He continues to improve.  His strength is improving.  He is working with physical therapy, and is able to ambulate with assistance.  No issues with the surgical incisions.  He remains at a skilled nursing facility and recently underwent a procedure for a feeding tube.  Since then, he has continued to improve.  He has gained some weight.  He looks more hydrated.  He continues to have issues with swallowing, most noticeably with postnasal drip.  Review of Systems: No fevers or chills No numbness or tingling No Chest Pain No shortness of breath Difficulty swallowing.    Objective: Ht 6\' 5"  (1.956 m)   Wt 136 lb (61.7 kg)   BMI 16.13 kg/m   Physical Exam:  Elderly male, seated in a wheelchair.  No acute distress.  Left lateral hip incision is healing well.  No surrounding erythema or drainage.  He  tolerates gentle range of motion of the left hip without discomfort.  No pain with axial loading.  Active motion intact in the TA/EHL.  He is able to maintain a straight leg raise.  IMAGING: I personally ordered and reviewed the following images:  X-rays of the pelvis, and left hip were obtained in clinic today and demonstrates excellent position of the hip prosthesis.  There is been no interval displacement.  The K wires and cerclage wire associated with the greater trochanter fracture remains unchanged in alignment.  The greater trochanter fracture is visible, but has not displaced.  No acute injuries are noted.  Impression: Left hip hemiarthroplasty in excellent position, with appropriate alignment of the greater trochanter fracture.  No evidence of hardware failure.   Mordecai Rasmussen, MD 05/24/2020 11:02 PM

## 2020-05-27 ENCOUNTER — Ambulatory Visit: Payer: Medicare Other | Admitting: Orthopedic Surgery

## 2020-05-27 DIAGNOSIS — R1314 Dysphagia, pharyngoesophageal phase: Secondary | ICD-10-CM | POA: Diagnosis not present

## 2020-05-27 DIAGNOSIS — I129 Hypertensive chronic kidney disease with stage 1 through stage 4 chronic kidney disease, or unspecified chronic kidney disease: Secondary | ICD-10-CM | POA: Diagnosis not present

## 2020-05-27 DIAGNOSIS — W19XXXD Unspecified fall, subsequent encounter: Secondary | ICD-10-CM | POA: Diagnosis not present

## 2020-05-27 DIAGNOSIS — S72002D Fracture of unspecified part of neck of left femur, subsequent encounter for closed fracture with routine healing: Secondary | ICD-10-CM | POA: Diagnosis not present

## 2020-05-27 DIAGNOSIS — N1831 Chronic kidney disease, stage 3a: Secondary | ICD-10-CM | POA: Diagnosis not present

## 2020-05-27 DIAGNOSIS — Z9181 History of falling: Secondary | ICD-10-CM | POA: Diagnosis not present

## 2020-05-27 DIAGNOSIS — M48062 Spinal stenosis, lumbar region with neurogenic claudication: Secondary | ICD-10-CM | POA: Diagnosis not present

## 2020-05-27 DIAGNOSIS — Z431 Encounter for attention to gastrostomy: Secondary | ICD-10-CM | POA: Diagnosis not present

## 2020-05-31 DIAGNOSIS — I129 Hypertensive chronic kidney disease with stage 1 through stage 4 chronic kidney disease, or unspecified chronic kidney disease: Secondary | ICD-10-CM | POA: Diagnosis not present

## 2020-05-31 DIAGNOSIS — S72002D Fracture of unspecified part of neck of left femur, subsequent encounter for closed fracture with routine healing: Secondary | ICD-10-CM | POA: Diagnosis not present

## 2020-05-31 DIAGNOSIS — N1831 Chronic kidney disease, stage 3a: Secondary | ICD-10-CM | POA: Diagnosis not present

## 2020-05-31 DIAGNOSIS — R1314 Dysphagia, pharyngoesophageal phase: Secondary | ICD-10-CM | POA: Diagnosis not present

## 2020-05-31 DIAGNOSIS — M48062 Spinal stenosis, lumbar region with neurogenic claudication: Secondary | ICD-10-CM | POA: Diagnosis not present

## 2020-05-31 DIAGNOSIS — Z431 Encounter for attention to gastrostomy: Secondary | ICD-10-CM | POA: Diagnosis not present

## 2020-06-02 DIAGNOSIS — I129 Hypertensive chronic kidney disease with stage 1 through stage 4 chronic kidney disease, or unspecified chronic kidney disease: Secondary | ICD-10-CM | POA: Diagnosis not present

## 2020-06-02 DIAGNOSIS — R1314 Dysphagia, pharyngoesophageal phase: Secondary | ICD-10-CM | POA: Diagnosis not present

## 2020-06-02 DIAGNOSIS — S72002D Fracture of unspecified part of neck of left femur, subsequent encounter for closed fracture with routine healing: Secondary | ICD-10-CM | POA: Diagnosis not present

## 2020-06-02 DIAGNOSIS — M48062 Spinal stenosis, lumbar region with neurogenic claudication: Secondary | ICD-10-CM | POA: Diagnosis not present

## 2020-06-02 DIAGNOSIS — Z431 Encounter for attention to gastrostomy: Secondary | ICD-10-CM | POA: Diagnosis not present

## 2020-06-02 DIAGNOSIS — N1831 Chronic kidney disease, stage 3a: Secondary | ICD-10-CM | POA: Diagnosis not present

## 2020-06-03 DIAGNOSIS — M48062 Spinal stenosis, lumbar region with neurogenic claudication: Secondary | ICD-10-CM | POA: Diagnosis not present

## 2020-06-03 DIAGNOSIS — I129 Hypertensive chronic kidney disease with stage 1 through stage 4 chronic kidney disease, or unspecified chronic kidney disease: Secondary | ICD-10-CM | POA: Diagnosis not present

## 2020-06-03 DIAGNOSIS — Z431 Encounter for attention to gastrostomy: Secondary | ICD-10-CM | POA: Diagnosis not present

## 2020-06-03 DIAGNOSIS — N1831 Chronic kidney disease, stage 3a: Secondary | ICD-10-CM | POA: Diagnosis not present

## 2020-06-03 DIAGNOSIS — R1314 Dysphagia, pharyngoesophageal phase: Secondary | ICD-10-CM | POA: Diagnosis not present

## 2020-06-03 DIAGNOSIS — S72002D Fracture of unspecified part of neck of left femur, subsequent encounter for closed fracture with routine healing: Secondary | ICD-10-CM | POA: Diagnosis not present

## 2020-06-07 DIAGNOSIS — S72012D Unspecified intracapsular fracture of left femur, subsequent encounter for closed fracture with routine healing: Secondary | ICD-10-CM | POA: Diagnosis not present

## 2020-06-07 DIAGNOSIS — R1310 Dysphagia, unspecified: Secondary | ICD-10-CM | POA: Diagnosis not present

## 2020-06-07 DIAGNOSIS — I10 Essential (primary) hypertension: Secondary | ICD-10-CM | POA: Diagnosis not present

## 2020-06-07 DIAGNOSIS — H019 Unspecified inflammation of eyelid: Secondary | ICD-10-CM | POA: Diagnosis not present

## 2020-06-08 ENCOUNTER — Other Ambulatory Visit: Payer: Self-pay

## 2020-06-08 ENCOUNTER — Telehealth: Payer: Self-pay | Admitting: Internal Medicine

## 2020-06-08 ENCOUNTER — Ambulatory Visit (INDEPENDENT_AMBULATORY_CARE_PROVIDER_SITE_OTHER): Payer: Medicare Other | Admitting: Internal Medicine

## 2020-06-08 ENCOUNTER — Encounter: Payer: Self-pay | Admitting: Internal Medicine

## 2020-06-08 DIAGNOSIS — R053 Chronic cough: Secondary | ICD-10-CM | POA: Diagnosis not present

## 2020-06-08 DIAGNOSIS — N1831 Chronic kidney disease, stage 3a: Secondary | ICD-10-CM | POA: Diagnosis not present

## 2020-06-08 DIAGNOSIS — I129 Hypertensive chronic kidney disease with stage 1 through stage 4 chronic kidney disease, or unspecified chronic kidney disease: Secondary | ICD-10-CM | POA: Diagnosis not present

## 2020-06-08 DIAGNOSIS — S72002D Fracture of unspecified part of neck of left femur, subsequent encounter for closed fracture with routine healing: Secondary | ICD-10-CM | POA: Diagnosis not present

## 2020-06-08 DIAGNOSIS — R1314 Dysphagia, pharyngoesophageal phase: Secondary | ICD-10-CM | POA: Diagnosis not present

## 2020-06-08 DIAGNOSIS — M48062 Spinal stenosis, lumbar region with neurogenic claudication: Secondary | ICD-10-CM | POA: Diagnosis not present

## 2020-06-08 DIAGNOSIS — Z431 Encounter for attention to gastrostomy: Secondary | ICD-10-CM | POA: Diagnosis not present

## 2020-06-08 MED ORDER — FAMOTIDINE 40 MG/5ML PO SUSR: 20.0000 mg | Freq: Every day | ORAL | 11 refills | Status: DC

## 2020-06-08 NOTE — Telephone Encounter (Signed)
The cxr from the [facility] was received  Spoke with [name] and notified her of this  Nothing further needed

## 2020-06-08 NOTE — Progress Notes (Signed)
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      History of Present Illness  06/08/2020  Pulmonary/ 1st office eval/ Naomy Esham / [facility]  Chief Complaint  Patient presents with  . Pulmonary Consult    Pt c/o cough and congestion in his throat. Cough is prod with thick, clear sputum- onset was about a year ago.   Dyspnea:  Very sedentary  Cough: clear mucus mostly pnds  Sleep: hob up 45 degrees but slides down  SABA use: none   TF stop about 830 pm and goes to bed around   No obvious day to day or daytime variability or assoc  purulent sputum or mucus plugs or hemoptysis or cp or chest tightness, subjective wheeze or overt sinus or hb symptoms.     Also denies any obvious fluctuation of symptoms with weather or environmental changes or other aggravating or alleviating factors except as outlined above   No unusual exposure hx or h/o childhood pna/ asthma or knowledge of premature birth.  Current Allergies, Complete Past Medical History, Past Surgical History, Family History, and Social History were reviewed in [employer] record.  ROS  The following are not active complaints unless bolded Hoarseness, sore throat, dysphagia, dental problems, itching, sneezing,  nasal congestion or discharge of excess mucus or purulent secretions, ear ache,   fever, chills, sweats, unintended wt loss or wt gain, classically pleuritic or exertional cp,  orthopnea pnd or arm/hand swelling  or leg swelling, presyncope, palpitations, abdominal pain, anorexia, nausea, vomiting, diarrhea  or change in bowel habits or change in bladder habits, change in stools or change in urine, dysuria, hematuria,  rash, arthralgias, visual complaints, headache,  numbness, weakness or ataxia or problems with walking or coordination,  change in mood or  memory.               Past Medical History:  Diagnosis Date  . Acid reflux    mild-states if doesnt chew food completely he begins to cough- states has weakness in muscles due to ilieus- has been doing exreci this  . Adynamic ileus ([place]) 12/2014   ? anesthesia related per pt  . BPH (benign prostatic hyperplasia)   . Complication of anesthesia    10/11/15- per pt "had [place]- in 11/16-related it to possibly anesthesia"  . H/O seasonal allergies   . Hyperlipidemia   . Hypertension     Outpatient Medications Prior to Visit  Medication Sig Dispense Refill  . acetaminophen (TYLENOL) 160 MG/5ML elixir Place 325 mg into feeding tube every 6 (six) hours as needed for fever. Give 650 mg    . Balsam [place]-Castor Oil (VENELEX) OINT Apply topically 3 (three) times daily. Topical, Every Shift, Apply to sacrum, coccyx and bilateral buttocks qshift for prevention.    . Cholecalciferol (VITAMIN D) 125 MCG (5000 UT) CAPS 5,000 Units by PEG Tube route daily in the afternoon. 0900    . cloNIDine (CATAPRES - DOSED IN MG/24 HR) 0.1 mg/24hr patch Place 1 patch (0.1 mg total) onto the skin once a week. Place on Fridays 4 patch 0  . fluorometholone (FML) 0.1 % ophthalmic suspension Place 1 drop into both eyes 2 (two)  times daily. 0900 & 2100 5 mL 0  . ondansetron (ZOFRAN) 4 MG tablet Take 1 tablet (4 mg total) by mouth every 6 (six) hours as needed for nausea or vomiting. 1 tab, gastric tube, Every 6 Hours - PRN, for nausea 20 tablet 0  . polyethylene glycol (MIRALAX / GLYCOLAX) 17 g packet Place 17 g into feeding tube daily as needed.    . rosuvastatin (CRESTOR) 20 MG tablet Place 1 tablet (20 mg total) into feeding tube at bedtime. 2100 30 tablet 0  . NON FORMULARY Diet NPO     No facility-administered medications prior to visit.     Objective:     BP 120/64 (BP Location: Left  Arm, Cuff Size: Normal)   Pulse 91   Temp 99.3 F (37.4 C) (Temporal)   Wt 140 lb (63.5 kg)   SpO2 95% Comment: on RA  BMI 16.60 kg/m   SpO2: 95 % (on RA)   W/c bound elderly wm with congested sounding cough   HEENT : pt wearing mask not removed for exam due to covid -19 concerns.    NECK :  without JVD/Nodes/TM/ nl carotid upstrokes bilaterally   LUNGS: no acc muscle use,  Nl contour chest which is clear to A and P bilaterally without cough on insp or exp maneuvers   CV:  RRR  no s3 or murmur or increase in P2, and no edema   ABD:  PEG in place soft and nontender with nl inspiratory excursion in the supine position. No bruits or organomegaly appreciated, bowel sounds nl  MS:   ext warm without deformities, calf tenderness, cyanosis or clubbing No obvious joint restrictions   SKIN: warm and dry without lesions    NEURO:  alert, approp, nl sensorium with  no motor or cerebellar deficits apparent.     I personally reviewed   radiology impression as follows:  CXR:   05/1820 Normal       Assessment   Chronic cough Onset 2021 assoc with oropharangeal dysphagia/PET dep  -  06/08/2020 referred to ENT and added pepcid 20 mg solution hs  His lungs are remarkably clear on exam despite gurgling quality cough from the upper airway so must be related to sinusitis/pnds or reflux or ongoing dysphagia so severe he can't swallow his own nl mucus production but I have nothing to offer from a pulmonary perspective at this point other than the above and use of robitussin prn  I did advise on bed blocks to see if help the exacerbations in am.   pulmoanry f/u is prn  - It was a pleasure meeting this nice man and his daughter and I wish I had more to offer here.    Dysphagia Present since traumatic intubation for laminectomy in 2017.  Dr. Luan Pulling performed a bronchoscopy; patient was told his "epiglottis and glottis do not close".  Following the procedure he was seen by Speech Therapist.   Dysphagia mainly with meat and bread but not with pills.  He compensates by chewing food very finely 04/27/2020 modified barium swallow showed laryngeal penetration and aspiration of contrast on multiple swallows both direct laryngeal penetration and aspiration as well as aspiration of pyriform sinus residuals, identified with both thin and nectar consistent barium.  Increasing vallecular residuals with honey, applesauce, and cracker consistency.  Silent aspiration was present without spontaneous cough reflex.  He failed epiglottic inversion throughout the exam. Peg tube placement by Dr. Aviva Signs, GS will be pursued.   ENT for  direct laryngoscopy to assess any anatomical cntribution to his dysphagia.  >>> agree with this plan already articulated by Dr Aram Candela   Each maintenance medication was reviewed in detail including emphasizing most importantly the difference between maintenance and prns and under what circumstances the prns are to be triggered using an action plan format where appropriate.  Total time for H and P, chart review, counseling,  and generating customized AVS unique to this office visit / same day charting = 35 min         Christinia Gully, MD 06/08/2020

## 2020-06-08 NOTE — Patient Instructions (Addendum)
Robitussin per bottle instructions as needed per PEG - try it see if it helps   Bed blocks  6-8 inches if possible   Pepcid 20 mg (2.5 ml)  at bedtime per peg  and delay sleep for 3 hours at least after last meal unless in recliner   I will refer you to Dr Victorio Palm group in [place]   Pulmonary follow up is as needed.

## 2020-06-09 ENCOUNTER — Encounter: Payer: Self-pay | Admitting: Internal Medicine

## 2020-06-09 DIAGNOSIS — I129 Hypertensive chronic kidney disease with stage 1 through stage 4 chronic kidney disease, or unspecified chronic kidney disease: Secondary | ICD-10-CM | POA: Diagnosis not present

## 2020-06-09 DIAGNOSIS — N1831 Chronic kidney disease, stage 3a: Secondary | ICD-10-CM | POA: Diagnosis not present

## 2020-06-09 DIAGNOSIS — S72002D Fracture of unspecified part of neck of left femur, subsequent encounter for closed fracture with routine healing: Secondary | ICD-10-CM | POA: Diagnosis not present

## 2020-06-09 DIAGNOSIS — Z431 Encounter for attention to gastrostomy: Secondary | ICD-10-CM | POA: Diagnosis not present

## 2020-06-09 DIAGNOSIS — R1314 Dysphagia, pharyngoesophageal phase: Secondary | ICD-10-CM | POA: Diagnosis not present

## 2020-06-09 DIAGNOSIS — M48062 Spinal stenosis, lumbar region with neurogenic claudication: Secondary | ICD-10-CM | POA: Diagnosis not present

## 2020-06-09 NOTE — Assessment & Plan Note (Addendum)
Onset 2021 assoc with oropharangeal dysphagia/PET dep  -  06/08/2020 referred to ENT and added pepcid 20 mg solution hs  His lungs are remarkably clear on exam despite gurgling quality cough from the upper airway so must be related to sinusitis/pnds or reflux or ongoing dysphagia so severe he can't swallow his own nl mucus production but I have nothing to offer from a pulmonary perspective at this point other than the above and use of robitussin prn  I did advise on bed blocks to see if help the exacerbations in am.   pulmoanry f/u is prn  - It was a pleasure meeting this nice man and his daughter and I wish I had more to offer here.    Each maintenance medication was reviewed in detail including emphasizing most importantly the difference between maintenance and prns and under what circumstances the prns are to be triggered using an action plan format where appropriate.  Total time for H and P, chart review, counseling,  and generating customized AVS unique to this office visit / same day charting = 35 min

## 2020-06-09 NOTE — Assessment & Plan Note (Signed)
Present since traumatic intubation for laminectomy in 2017.  Dr. Luan Pulling performed a bronchoscopy; patient was told his "epiglottis and glottis do not close".  Following the procedure he was seen by Speech Therapist.  Dysphagia mainly with meat and bread but not with pills.  He compensates by chewing food very finely 04/27/2020 modified barium swallow showed laryngeal penetration and aspiration of contrast on multiple swallows both direct laryngeal penetration and aspiration as well as aspiration of pyriform sinus residuals, identified with both thin and nectar consistent barium.  Increasing vallecular residuals with honey, applesauce, and cracker consistency.  Silent aspiration was present without spontaneous cough reflex.  He failed epiglottic inversion throughout the exam. Peg tube placement by Dr. Aviva Signs, GS will be pursued.   ENT for direct laryngoscopy to assess any anatomical cntribution to his dysphagia.  >>> agree with this plan already articulated by Dr Linna Darner

## 2020-06-10 DIAGNOSIS — M48062 Spinal stenosis, lumbar region with neurogenic claudication: Secondary | ICD-10-CM | POA: Diagnosis not present

## 2020-06-10 DIAGNOSIS — Z431 Encounter for attention to gastrostomy: Secondary | ICD-10-CM | POA: Diagnosis not present

## 2020-06-10 DIAGNOSIS — I129 Hypertensive chronic kidney disease with stage 1 through stage 4 chronic kidney disease, or unspecified chronic kidney disease: Secondary | ICD-10-CM | POA: Diagnosis not present

## 2020-06-10 DIAGNOSIS — N1831 Chronic kidney disease, stage 3a: Secondary | ICD-10-CM | POA: Diagnosis not present

## 2020-06-10 DIAGNOSIS — R1314 Dysphagia, pharyngoesophageal phase: Secondary | ICD-10-CM | POA: Diagnosis not present

## 2020-06-10 DIAGNOSIS — S72002D Fracture of unspecified part of neck of left femur, subsequent encounter for closed fracture with routine healing: Secondary | ICD-10-CM | POA: Diagnosis not present

## 2020-06-14 DIAGNOSIS — M48062 Spinal stenosis, lumbar region with neurogenic claudication: Secondary | ICD-10-CM | POA: Diagnosis not present

## 2020-06-14 DIAGNOSIS — R1314 Dysphagia, pharyngoesophageal phase: Secondary | ICD-10-CM | POA: Diagnosis not present

## 2020-06-14 DIAGNOSIS — I129 Hypertensive chronic kidney disease with stage 1 through stage 4 chronic kidney disease, or unspecified chronic kidney disease: Secondary | ICD-10-CM | POA: Diagnosis not present

## 2020-06-14 DIAGNOSIS — S72002D Fracture of unspecified part of neck of left femur, subsequent encounter for closed fracture with routine healing: Secondary | ICD-10-CM | POA: Diagnosis not present

## 2020-06-14 DIAGNOSIS — N1831 Chronic kidney disease, stage 3a: Secondary | ICD-10-CM | POA: Diagnosis not present

## 2020-06-14 DIAGNOSIS — Z431 Encounter for attention to gastrostomy: Secondary | ICD-10-CM | POA: Diagnosis not present

## 2020-06-15 DIAGNOSIS — R1314 Dysphagia, pharyngoesophageal phase: Secondary | ICD-10-CM | POA: Diagnosis not present

## 2020-06-15 DIAGNOSIS — N1831 Chronic kidney disease, stage 3a: Secondary | ICD-10-CM | POA: Diagnosis not present

## 2020-06-15 DIAGNOSIS — S72002D Fracture of unspecified part of neck of left femur, subsequent encounter for closed fracture with routine healing: Secondary | ICD-10-CM | POA: Diagnosis not present

## 2020-06-15 DIAGNOSIS — I129 Hypertensive chronic kidney disease with stage 1 through stage 4 chronic kidney disease, or unspecified chronic kidney disease: Secondary | ICD-10-CM | POA: Diagnosis not present

## 2020-06-15 DIAGNOSIS — M48062 Spinal stenosis, lumbar region with neurogenic claudication: Secondary | ICD-10-CM | POA: Diagnosis not present

## 2020-06-15 DIAGNOSIS — Z431 Encounter for attention to gastrostomy: Secondary | ICD-10-CM | POA: Diagnosis not present

## 2020-06-16 DIAGNOSIS — M48062 Spinal stenosis, lumbar region with neurogenic claudication: Secondary | ICD-10-CM | POA: Diagnosis not present

## 2020-06-16 DIAGNOSIS — N1831 Chronic kidney disease, stage 3a: Secondary | ICD-10-CM | POA: Diagnosis not present

## 2020-06-16 DIAGNOSIS — Z431 Encounter for attention to gastrostomy: Secondary | ICD-10-CM | POA: Diagnosis not present

## 2020-06-16 DIAGNOSIS — R1314 Dysphagia, pharyngoesophageal phase: Secondary | ICD-10-CM | POA: Diagnosis not present

## 2020-06-16 DIAGNOSIS — I129 Hypertensive chronic kidney disease with stage 1 through stage 4 chronic kidney disease, or unspecified chronic kidney disease: Secondary | ICD-10-CM | POA: Diagnosis not present

## 2020-06-16 DIAGNOSIS — S72002D Fracture of unspecified part of neck of left femur, subsequent encounter for closed fracture with routine healing: Secondary | ICD-10-CM | POA: Diagnosis not present

## 2020-06-17 DIAGNOSIS — N1831 Chronic kidney disease, stage 3a: Secondary | ICD-10-CM | POA: Diagnosis not present

## 2020-06-17 DIAGNOSIS — Z431 Encounter for attention to gastrostomy: Secondary | ICD-10-CM | POA: Diagnosis not present

## 2020-06-17 DIAGNOSIS — M48062 Spinal stenosis, lumbar region with neurogenic claudication: Secondary | ICD-10-CM | POA: Diagnosis not present

## 2020-06-17 DIAGNOSIS — I129 Hypertensive chronic kidney disease with stage 1 through stage 4 chronic kidney disease, or unspecified chronic kidney disease: Secondary | ICD-10-CM | POA: Diagnosis not present

## 2020-06-17 DIAGNOSIS — S72002D Fracture of unspecified part of neck of left femur, subsequent encounter for closed fracture with routine healing: Secondary | ICD-10-CM | POA: Diagnosis not present

## 2020-06-17 DIAGNOSIS — R1314 Dysphagia, pharyngoesophageal phase: Secondary | ICD-10-CM | POA: Diagnosis not present

## 2020-06-20 DIAGNOSIS — N1831 Chronic kidney disease, stage 3a: Secondary | ICD-10-CM | POA: Diagnosis not present

## 2020-06-20 DIAGNOSIS — R1314 Dysphagia, pharyngoesophageal phase: Secondary | ICD-10-CM | POA: Diagnosis not present

## 2020-06-20 DIAGNOSIS — S72002D Fracture of unspecified part of neck of left femur, subsequent encounter for closed fracture with routine healing: Secondary | ICD-10-CM | POA: Diagnosis not present

## 2020-06-20 DIAGNOSIS — I129 Hypertensive chronic kidney disease with stage 1 through stage 4 chronic kidney disease, or unspecified chronic kidney disease: Secondary | ICD-10-CM | POA: Diagnosis not present

## 2020-06-20 DIAGNOSIS — Z431 Encounter for attention to gastrostomy: Secondary | ICD-10-CM | POA: Diagnosis not present

## 2020-06-20 DIAGNOSIS — M48062 Spinal stenosis, lumbar region with neurogenic claudication: Secondary | ICD-10-CM | POA: Diagnosis not present

## 2020-06-21 DIAGNOSIS — Z431 Encounter for attention to gastrostomy: Secondary | ICD-10-CM | POA: Diagnosis not present

## 2020-06-21 DIAGNOSIS — R1314 Dysphagia, pharyngoesophageal phase: Secondary | ICD-10-CM | POA: Diagnosis not present

## 2020-06-21 DIAGNOSIS — I129 Hypertensive chronic kidney disease with stage 1 through stage 4 chronic kidney disease, or unspecified chronic kidney disease: Secondary | ICD-10-CM | POA: Diagnosis not present

## 2020-06-21 DIAGNOSIS — S72002D Fracture of unspecified part of neck of left femur, subsequent encounter for closed fracture with routine healing: Secondary | ICD-10-CM | POA: Diagnosis not present

## 2020-06-21 DIAGNOSIS — M48062 Spinal stenosis, lumbar region with neurogenic claudication: Secondary | ICD-10-CM | POA: Diagnosis not present

## 2020-06-21 DIAGNOSIS — N1831 Chronic kidney disease, stage 3a: Secondary | ICD-10-CM | POA: Diagnosis not present

## 2020-06-22 DIAGNOSIS — R1310 Dysphagia, unspecified: Secondary | ICD-10-CM | POA: Diagnosis not present

## 2020-06-22 DIAGNOSIS — Z931 Gastrostomy status: Secondary | ICD-10-CM | POA: Diagnosis not present

## 2020-06-22 DIAGNOSIS — M48062 Spinal stenosis, lumbar region with neurogenic claudication: Secondary | ICD-10-CM | POA: Diagnosis not present

## 2020-06-22 DIAGNOSIS — R1314 Dysphagia, pharyngoesophageal phase: Secondary | ICD-10-CM | POA: Diagnosis not present

## 2020-06-22 DIAGNOSIS — N1831 Chronic kidney disease, stage 3a: Secondary | ICD-10-CM | POA: Diagnosis not present

## 2020-06-22 DIAGNOSIS — I129 Hypertensive chronic kidney disease with stage 1 through stage 4 chronic kidney disease, or unspecified chronic kidney disease: Secondary | ICD-10-CM | POA: Diagnosis not present

## 2020-06-22 DIAGNOSIS — Z431 Encounter for attention to gastrostomy: Secondary | ICD-10-CM | POA: Diagnosis not present

## 2020-06-22 DIAGNOSIS — S72002D Fracture of unspecified part of neck of left femur, subsequent encounter for closed fracture with routine healing: Secondary | ICD-10-CM | POA: Diagnosis not present

## 2020-06-22 DIAGNOSIS — J3489 Other specified disorders of nose and nasal sinuses: Secondary | ICD-10-CM | POA: Diagnosis not present

## 2020-06-22 DIAGNOSIS — R053 Chronic cough: Secondary | ICD-10-CM | POA: Diagnosis not present

## 2020-06-23 DIAGNOSIS — S72002D Fracture of unspecified part of neck of left femur, subsequent encounter for closed fracture with routine healing: Secondary | ICD-10-CM | POA: Diagnosis not present

## 2020-06-23 DIAGNOSIS — M48062 Spinal stenosis, lumbar region with neurogenic claudication: Secondary | ICD-10-CM | POA: Diagnosis not present

## 2020-06-23 DIAGNOSIS — N1831 Chronic kidney disease, stage 3a: Secondary | ICD-10-CM | POA: Diagnosis not present

## 2020-06-23 DIAGNOSIS — Z431 Encounter for attention to gastrostomy: Secondary | ICD-10-CM | POA: Diagnosis not present

## 2020-06-23 DIAGNOSIS — R1314 Dysphagia, pharyngoesophageal phase: Secondary | ICD-10-CM | POA: Diagnosis not present

## 2020-06-23 DIAGNOSIS — I129 Hypertensive chronic kidney disease with stage 1 through stage 4 chronic kidney disease, or unspecified chronic kidney disease: Secondary | ICD-10-CM | POA: Diagnosis not present

## 2020-06-26 DIAGNOSIS — M48062 Spinal stenosis, lumbar region with neurogenic claudication: Secondary | ICD-10-CM | POA: Diagnosis not present

## 2020-06-26 DIAGNOSIS — Z431 Encounter for attention to gastrostomy: Secondary | ICD-10-CM | POA: Diagnosis not present

## 2020-06-26 DIAGNOSIS — Z9181 History of falling: Secondary | ICD-10-CM | POA: Diagnosis not present

## 2020-06-26 DIAGNOSIS — R1314 Dysphagia, pharyngoesophageal phase: Secondary | ICD-10-CM | POA: Diagnosis not present

## 2020-06-26 DIAGNOSIS — E43 Unspecified severe protein-calorie malnutrition: Secondary | ICD-10-CM | POA: Diagnosis not present

## 2020-06-26 DIAGNOSIS — I129 Hypertensive chronic kidney disease with stage 1 through stage 4 chronic kidney disease, or unspecified chronic kidney disease: Secondary | ICD-10-CM | POA: Diagnosis not present

## 2020-06-26 DIAGNOSIS — N1831 Chronic kidney disease, stage 3a: Secondary | ICD-10-CM | POA: Diagnosis not present

## 2020-06-26 DIAGNOSIS — S72002D Fracture of unspecified part of neck of left femur, subsequent encounter for closed fracture with routine healing: Secondary | ICD-10-CM | POA: Diagnosis not present

## 2020-06-26 DIAGNOSIS — W19XXXD Unspecified fall, subsequent encounter: Secondary | ICD-10-CM | POA: Diagnosis not present

## 2020-06-28 ENCOUNTER — Telehealth (INDEPENDENT_AMBULATORY_CARE_PROVIDER_SITE_OTHER): Payer: Self-pay | Admitting: Internal Medicine

## 2020-06-28 DIAGNOSIS — R1314 Dysphagia, pharyngoesophageal phase: Secondary | ICD-10-CM

## 2020-06-28 DIAGNOSIS — Z431 Encounter for attention to gastrostomy: Secondary | ICD-10-CM | POA: Diagnosis not present

## 2020-06-28 DIAGNOSIS — S72002D Fracture of unspecified part of neck of left femur, subsequent encounter for closed fracture with routine healing: Secondary | ICD-10-CM | POA: Diagnosis not present

## 2020-06-28 DIAGNOSIS — N1831 Chronic kidney disease, stage 3a: Secondary | ICD-10-CM | POA: Diagnosis not present

## 2020-06-28 DIAGNOSIS — M48062 Spinal stenosis, lumbar region with neurogenic claudication: Secondary | ICD-10-CM | POA: Diagnosis not present

## 2020-06-28 DIAGNOSIS — I129 Hypertensive chronic kidney disease with stage 1 through stage 4 chronic kidney disease, or unspecified chronic kidney disease: Secondary | ICD-10-CM | POA: Diagnosis not present

## 2020-06-28 NOTE — Telephone Encounter (Signed)
Patient called the office stated he is having difficulty swallowing would like to talk to Dr Laural Golden - please advise - ph# [phone]

## 2020-06-28 NOTE — Telephone Encounter (Signed)
I spoke with the patient and he states he has had difficulty swallowing for the last two years. He had a chest tube placed a month ago by Dr.Jenkins, and on feeding tube since then. He says he thought the swallowing issue was stemming from sinuses or his chest,but states Dr. Arnoldo Morale has ruled out the chest and Ent has ruled out the Sinuses. He states he does not have a Doctor to advise when to have it removed he would like a call back from Dr. Laural Golden to discuss. [phone]

## 2020-06-28 NOTE — Telephone Encounter (Signed)
Per Dr. Laural Golden he will call the patient to discuss.

## 2020-06-28 NOTE — Telephone Encounter (Signed)
I will discuss with Dr. Laural Golden.

## 2020-06-30 NOTE — Telephone Encounter (Signed)
I talked with Dr. Emilee Hero on the evening of 06/28/2020. He wants to have his PEG removed which was placed by Dr. Emilee Hero for oropharyngeal/esophageal dysphagia He wants to have his tube removed. From his speech slightly telemetry he may not be ready for oral feeding I recommended that his swallowing function should be reevaluated before PEG tube is removed. He told me that he has gained 5 pounds.  Apparently he had lost 7 pounds during his illness resulting from fall leading to fracture left hip fracture. Request for consultation placed in epic.

## 2020-07-01 DIAGNOSIS — I129 Hypertensive chronic kidney disease with stage 1 through stage 4 chronic kidney disease, or unspecified chronic kidney disease: Secondary | ICD-10-CM | POA: Diagnosis not present

## 2020-07-01 DIAGNOSIS — S72002D Fracture of unspecified part of neck of left femur, subsequent encounter for closed fracture with routine healing: Secondary | ICD-10-CM | POA: Diagnosis not present

## 2020-07-01 DIAGNOSIS — N1831 Chronic kidney disease, stage 3a: Secondary | ICD-10-CM | POA: Diagnosis not present

## 2020-07-01 DIAGNOSIS — Z431 Encounter for attention to gastrostomy: Secondary | ICD-10-CM | POA: Diagnosis not present

## 2020-07-01 DIAGNOSIS — R1314 Dysphagia, pharyngoesophageal phase: Secondary | ICD-10-CM | POA: Diagnosis not present

## 2020-07-01 DIAGNOSIS — M48062 Spinal stenosis, lumbar region with neurogenic claudication: Secondary | ICD-10-CM | POA: Diagnosis not present

## 2020-07-04 DIAGNOSIS — M48062 Spinal stenosis, lumbar region with neurogenic claudication: Secondary | ICD-10-CM | POA: Diagnosis not present

## 2020-07-04 DIAGNOSIS — N1831 Chronic kidney disease, stage 3a: Secondary | ICD-10-CM | POA: Diagnosis not present

## 2020-07-04 DIAGNOSIS — S72002D Fracture of unspecified part of neck of left femur, subsequent encounter for closed fracture with routine healing: Secondary | ICD-10-CM | POA: Diagnosis not present

## 2020-07-04 DIAGNOSIS — Z431 Encounter for attention to gastrostomy: Secondary | ICD-10-CM | POA: Diagnosis not present

## 2020-07-04 DIAGNOSIS — R1314 Dysphagia, pharyngoesophageal phase: Secondary | ICD-10-CM | POA: Diagnosis not present

## 2020-07-04 DIAGNOSIS — I129 Hypertensive chronic kidney disease with stage 1 through stage 4 chronic kidney disease, or unspecified chronic kidney disease: Secondary | ICD-10-CM | POA: Diagnosis not present

## 2020-07-05 ENCOUNTER — Telehealth (INDEPENDENT_AMBULATORY_CARE_PROVIDER_SITE_OTHER): Payer: Self-pay | Admitting: Internal Medicine

## 2020-07-05 DIAGNOSIS — I129 Hypertensive chronic kidney disease with stage 1 through stage 4 chronic kidney disease, or unspecified chronic kidney disease: Secondary | ICD-10-CM | POA: Diagnosis not present

## 2020-07-05 DIAGNOSIS — Z431 Encounter for attention to gastrostomy: Secondary | ICD-10-CM | POA: Diagnosis not present

## 2020-07-05 DIAGNOSIS — R1314 Dysphagia, pharyngoesophageal phase: Secondary | ICD-10-CM | POA: Diagnosis not present

## 2020-07-05 DIAGNOSIS — N1831 Chronic kidney disease, stage 3a: Secondary | ICD-10-CM | POA: Diagnosis not present

## 2020-07-05 DIAGNOSIS — S72002D Fracture of unspecified part of neck of left femur, subsequent encounter for closed fracture with routine healing: Secondary | ICD-10-CM | POA: Diagnosis not present

## 2020-07-05 DIAGNOSIS — M48062 Spinal stenosis, lumbar region with neurogenic claudication: Secondary | ICD-10-CM | POA: Diagnosis not present

## 2020-07-05 NOTE — Telephone Encounter (Signed)
Reevaluated Is missing from the last note.

## 2020-07-05 NOTE — Telephone Encounter (Signed)
I returned [name] call. He has not yet heard from speech therapy clinic. Not sure why the delay since I talk with him personally last week and also send him written requests. My office called him again today. Will arrange for office visit as soon as swallowing function has been

## 2020-07-06 DIAGNOSIS — R1314 Dysphagia, pharyngoesophageal phase: Secondary | ICD-10-CM | POA: Diagnosis not present

## 2020-07-06 DIAGNOSIS — I129 Hypertensive chronic kidney disease with stage 1 through stage 4 chronic kidney disease, or unspecified chronic kidney disease: Secondary | ICD-10-CM | POA: Diagnosis not present

## 2020-07-06 DIAGNOSIS — N1831 Chronic kidney disease, stage 3a: Secondary | ICD-10-CM | POA: Diagnosis not present

## 2020-07-06 DIAGNOSIS — S72002D Fracture of unspecified part of neck of left femur, subsequent encounter for closed fracture with routine healing: Secondary | ICD-10-CM | POA: Diagnosis not present

## 2020-07-06 DIAGNOSIS — Z431 Encounter for attention to gastrostomy: Secondary | ICD-10-CM | POA: Diagnosis not present

## 2020-07-06 DIAGNOSIS — M48062 Spinal stenosis, lumbar region with neurogenic claudication: Secondary | ICD-10-CM | POA: Diagnosis not present

## 2020-07-07 ENCOUNTER — Telehealth (HOSPITAL_COMMUNITY): Payer: Self-pay | Admitting: Speech Pathology

## 2020-07-07 ENCOUNTER — Other Ambulatory Visit (HOSPITAL_COMMUNITY): Payer: Self-pay | Admitting: Specialist

## 2020-07-07 DIAGNOSIS — R633 Feeding difficulties, unspecified: Secondary | ICD-10-CM

## 2020-07-07 DIAGNOSIS — R1314 Dysphagia, pharyngoesophageal phase: Secondary | ICD-10-CM

## 2020-07-07 DIAGNOSIS — R1312 Dysphagia, oropharyngeal phase: Secondary | ICD-10-CM

## 2020-07-07 NOTE — Telephone Encounter (Signed)
S/w wife and she understands the apptment time is 10:30am on 07/15/20 @ [facility] for MBSS.

## 2020-07-13 DIAGNOSIS — N1831 Chronic kidney disease, stage 3a: Secondary | ICD-10-CM | POA: Diagnosis not present

## 2020-07-13 DIAGNOSIS — I129 Hypertensive chronic kidney disease with stage 1 through stage 4 chronic kidney disease, or unspecified chronic kidney disease: Secondary | ICD-10-CM | POA: Diagnosis not present

## 2020-07-13 DIAGNOSIS — R1314 Dysphagia, pharyngoesophageal phase: Secondary | ICD-10-CM | POA: Diagnosis not present

## 2020-07-13 DIAGNOSIS — M48062 Spinal stenosis, lumbar region with neurogenic claudication: Secondary | ICD-10-CM | POA: Diagnosis not present

## 2020-07-13 DIAGNOSIS — S72002D Fracture of unspecified part of neck of left femur, subsequent encounter for closed fracture with routine healing: Secondary | ICD-10-CM | POA: Diagnosis not present

## 2020-07-13 DIAGNOSIS — Z431 Encounter for attention to gastrostomy: Secondary | ICD-10-CM | POA: Diagnosis not present

## 2020-07-15 ENCOUNTER — Other Ambulatory Visit: Payer: Self-pay

## 2020-07-15 ENCOUNTER — Encounter (HOSPITAL_COMMUNITY): Payer: Self-pay | Admitting: Speech Pathology

## 2020-07-15 ENCOUNTER — Ambulatory Visit (HOSPITAL_COMMUNITY): Payer: Medicare Other | Attending: Internal Medicine | Admitting: Speech Pathology

## 2020-07-15 ENCOUNTER — Ambulatory Visit (HOSPITAL_COMMUNITY)
Admission: RE | Admit: 2020-07-15 | Discharge: 2020-07-15 | Disposition: A | Discharge status: Home / Self Care | Payer: Medicare Other | Source: Ambulatory Visit | Attending: Internal Medicine | Admitting: Internal Medicine

## 2020-07-15 DIAGNOSIS — R1310 Dysphagia, unspecified: Secondary | ICD-10-CM | POA: Diagnosis not present

## 2020-07-15 DIAGNOSIS — R1314 Dysphagia, pharyngoesophageal phase: Secondary | ICD-10-CM | POA: Diagnosis not present

## 2020-07-15 DIAGNOSIS — R1312 Dysphagia, oropharyngeal phase: Secondary | ICD-10-CM | POA: Diagnosis not present

## 2020-07-15 DIAGNOSIS — R633 Feeding difficulties, unspecified: Secondary | ICD-10-CM

## 2020-07-15 NOTE — Therapy (Signed)
[place], [place], [address] Phone: [phone]   Fax:  [phone]  Modified Barium Swallow  Patient Details  Name: Dominic Hodges MRN: [id number] Date of Birth: 09/13/1927 No data recorded  Encounter Date: 07/15/2020   End of Session - 07/15/20 1437     Visit Number 1    Number of Visits 1    Activity Tolerance Patient tolerated treatment well             Past Medical History:  Past Medical History:  Diagnosis Date   Acid reflux    mild-states if doesnt chew food completely he begins to cough- states has weakness in muscles due to ilieus- has been doing exreci this   Adynamic ileus ([place]) 12/2014   ? anesthesia related per pt   BPH (benign prostatic hyperplasia)    Complication of anesthesia    10/11/15- per pt "had [place]- in 11/16-related it to possibly anesthesia"   H/O seasonal allergies    Hyperlipidemia    Hypertension    Past Surgical History:  Past Surgical History:  Procedure Laterality Date   APPENDECTOMY     approx 30 years ago/lwb   CATARACT EXTRACTION W/PHACO Left 09/11/2012   Procedure: CATARACT EXTRACTION PHACO AND INTRAOCULAR LENS PLACEMENT ([place]);  Surgeon: Tonny Branch, MD;  Location: [facility];  Service: Ophthalmology;  Laterality: Left;  CDE: 19.52   ESOPHAGOGASTRODUODENOSCOPY (EGD) WITH PROPOFOL N/A 05/03/2020   Procedure: ESOPHAGOGASTRODUODENOSCOPY (EGD) WITH PROPOFOL;  Surgeon: Aviva Signs, MD;  Location: [facility];  Service: General;  Laterality: N/A;   HIP ARTHROPLASTY Right 12/06/2014   Procedure: PARTIAL RIGHT HIP REPLACEMENT;  Surgeon: Carole Civil, MD;  Location: [facility];  Service: Orthopedics;  Laterality: Right;   HIP ARTHROPLASTY Left 03/08/2020   Procedure: ARTHROPLASTY BIPOLAR HIP (HEMIARTHROPLASTY);  Surgeon: Mordecai Rasmussen, MD;  Location: [facility];  Service: Orthopedics;  Laterality: Left;   LUMBAR LAMINECTOMY/DECOMPRESSION MICRODISCECTOMY  N/A 10/19/2015   Procedure: CENTRAL DECOMPRESSION LUMBAR LAMINECTOMY FOR SPINAL STENOSIS, L3,L4FORAMINOTOMY BILATERAL L3,L4 NERVE ROOT;  Surgeon: Latanya Maudlin, MD;  Location: [facility];  Service: Orthopedics;  Laterality: N/A;   PEG PLACEMENT N/A 05/03/2020   Procedure: PERCUTANEOUS ENDOSCOPIC GASTROSTOMY (PEG) PLACEMENT;  Surgeon: Aviva Signs, MD;  Location: [facility];  Service: General;  Laterality: N/A;   UMBILICAL HERNIA REPAIR     Approx 10 years ago/lwb   ureteral stone extraction     HPI: 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  Significant medical history includes essential hypertension, dyslipidemia, nephrolithiasis, BPH, and GERD. Per Pt report, he has had dysphagia and cough since intubation for a laminectomy in 2017. After his admission to [facility] (2022), Pt experience significant weight loss over ~ 1 month and complained of dysphagia; He was eventually referred for an MBSS.  That MBSS 04/27/20 revealed severe dysphagia with recommendation for alternative means of nutrition; PEG tube was placed on 05/03/20, after tube feedings were initiated, Pt's weight remained stable and he has since gained some weight. Pt has not had much speech therapy follow up and has remained NPO since MBSS was completed. Pt would like to have his PEG tube removed and repeat MBSS was requested to objectively re-assess the swallowing function  and determine PO readiness.   Subjective: "I want to get this tube out"    Assessment / Plan / Recommendation  CHL IP CLINICAL IMPRESSIONS 07/15/2020  Clinical Impression Pt presents with moderate/severe sensorimotor dysphagia characterized by silent aspiration of all liquid consistencies assessed (thin, NTL and HTL). Note some improvement from recent MBSS  in that no aspiration of solid textures was noted today, and mildly decreased pharyngeal residue with solid textures was also noted from severe (last study) to moderate (today). Pt continues to demonstrate poor laryngeal vestibule closure, decreased BOT retraction, decreased epiglottic deflection, and decreased pharyngeal squeeze. Thin and NTL were aspirated before during and after the swallow - trace to moderate amounts and attempted strategies (chin tuck and effortful swallow) were not effective in decreasing or eliminating penetration/aspiration. Honey thick liquids were not initially aspirated, however, mild to moderate pyriform residue was eventually aspirated after the swallow. Puree and regular textures were consumed with moderate valleculae and pyriform residue, requiring 5-10 additional swallows to clear residue; note NO penetration or aspiration of solid textures was visualized. Recommend initiate a solid diet of puree/minced textures with no liquids at this time. Recommend, however, free water protocol: *permit regular water in between meals after oral care*. Recommend initiate [place] speech therapy for pharyngeal strengthening exercises, consider initiate EMST training and repeat MBSS when clinically appropriate. SLP reviewed recommendations and findings with Pt and Pt's daughter [name]) at length and provided written recommendations. Recommend consider RD consult to assess Pt's weight gain, calorie count/intake and consideration of titrating feeds based on nutritional needs.  Consulted with [facility] (Pt is currently receiving HH PT and OT from this agency) to recommend initiating [place]. If needs cannot be met with [facility] recommend transition to an agency that can provide all medically necessary disciplines. OP ST is also an option if Pt is physically able and transporation is not an issue.  SLP Visit Diagnosis Dysphagia, pharyngeal phase (R13.13)  Attention and  concentration deficit following --  Frontal lobe and executive function deficit following --  Impact on safety and function Severe aspiration risk;Risk for inadequate nutrition/hydration      CHL IP TREATMENT RECOMMENDATION 04/27/2020  Treatment Recommendations Therapy as outlined in treatment plan below     Prognosis 04/27/2020  Prognosis for Safe Diet Advancement Guarded  Barriers to Reach Goals Severity of deficits;Time post onset  Barriers/Prognosis Comment --    CHL IP DIET RECOMMENDATION 07/15/2020  SLP Diet Recommendations Dysphagia 1 (Puree) solids  Liquid Administration via Cup;No straw  Medication Administration Via alternative means  Compensations Minimize environmental distractions;Slow rate;Small sips/bites;Multiple dry swallows after each bite/sip;Clear throat intermittently  Postural Changes Seated upright at 90 degrees      CHL IP OTHER RECOMMENDATIONS 07/15/2020  Recommended Consults --  Oral Care Recommendations Oral care QID  Other Recommendations Prohibited food (jello, ice cream, thin soups)      CHL IP FOLLOW UP RECOMMENDATIONS 07/15/2020  Follow up Recommendations Home health SLP      No flowsheet data found.         CHL IP ORAL PHASE 07/15/2020  Oral Phase WFL  Oral - Pudding Teaspoon --  Oral - Pudding Cup --  Oral - Honey Teaspoon --  Oral - Honey Cup --  Oral - Nectar Teaspoon --  Oral - Nectar Cup --  Oral - Nectar Straw --  Oral - Thin Teaspoon --  Oral - Thin Cup --  Oral - Thin Straw --  Oral -  Puree --  Oral - Mech Soft --  Oral - Regular --  Oral - Multi-Consistency --  Oral - Pill --  Oral Phase - Comment --    CHL IP PHARYNGEAL PHASE 07/15/2020  Pharyngeal Phase Impaired  Pharyngeal- Pudding Teaspoon --  Pharyngeal --  Pharyngeal- Pudding Cup --  Pharyngeal --  Pharyngeal- Honey Teaspoon Reduced pharyngeal peristalsis;Reduced epiglottic inversion;Reduced anterior laryngeal mobility;Reduced laryngeal elevation;Reduced airway/laryngeal  closure;Reduced tongue base retraction;Penetration/Aspiration during swallow;Penetration/Apiration after swallow;Pharyngeal residue - valleculae;Pharyngeal residue - pyriform;Delayed swallow initiation-pyriform sinuses;Compensatory strategies attempted (with notebox);Trace aspiration  Pharyngeal Material enters airway, passes BELOW cords without attempt by patient to eject out (silent aspiration)  Pharyngeal- Honey Cup Reduced pharyngeal peristalsis;Reduced epiglottic inversion;Reduced anterior laryngeal mobility;Reduced laryngeal elevation;Reduced airway/laryngeal closure;Reduced tongue base retraction;Penetration/Aspiration before swallow;Penetration/Aspiration during swallow;Penetration/Apiration after swallow;Moderate aspiration;Pharyngeal residue - valleculae;Pharyngeal residue - pyriform;Delayed swallow initiation-pyriform sinuses;Compensatory strategies attempted (with notebox)  Pharyngeal Material enters airway, passes BELOW cords without attempt by patient to eject out (silent aspiration)  Pharyngeal- Nectar Teaspoon Reduced pharyngeal peristalsis;Reduced epiglottic inversion;Reduced anterior laryngeal mobility;Reduced laryngeal elevation;Reduced airway/laryngeal closure;Reduced tongue base retraction;Penetration/Aspiration before swallow;Penetration/Aspiration during swallow;Penetration/Apiration after swallow;Moderate aspiration;Pharyngeal residue - valleculae;Pharyngeal residue - pyriform;Delayed swallow initiation-pyriform sinuses;Compensatory strategies attempted (with notebox)  Pharyngeal Material enters airway, passes BELOW cords without attempt by patient to eject out (silent aspiration)  Pharyngeal- Nectar Cup Reduced pharyngeal peristalsis;Reduced epiglottic inversion;Reduced anterior laryngeal mobility;Reduced laryngeal elevation;Reduced airway/laryngeal closure;Reduced tongue base retraction;Penetration/Aspiration before swallow;Penetration/Aspiration during swallow;Penetration/Apiration  after swallow;Moderate aspiration;Pharyngeal residue - valleculae;Pharyngeal residue - pyriform;Delayed swallow initiation-pyriform sinuses;Compensatory strategies attempted (with notebox)  Pharyngeal Material enters airway, passes BELOW cords without attempt by patient to eject out (silent aspiration)  Pharyngeal- Nectar Straw NT  Pharyngeal --  Pharyngeal- Thin Teaspoon Reduced pharyngeal peristalsis;Reduced epiglottic inversion;Reduced anterior laryngeal mobility;Reduced laryngeal elevation;Reduced airway/laryngeal closure;Reduced tongue base retraction;Penetration/Aspiration before swallow;Penetration/Aspiration during swallow;Penetration/Apiration after swallow;Moderate aspiration;Pharyngeal residue - valleculae;Pharyngeal residue - pyriform;Delayed swallow initiation-pyriform sinuses;Compensatory strategies attempted (with notebox)  Pharyngeal Material enters airway, passes BELOW cords without attempt by patient to eject out (silent aspiration)  Pharyngeal- Thin Cup Reduced pharyngeal peristalsis;Reduced epiglottic inversion;Reduced anterior laryngeal mobility;Reduced laryngeal elevation;Reduced airway/laryngeal closure;Reduced tongue base retraction;Penetration/Aspiration before swallow;Penetration/Aspiration during swallow;Penetration/Apiration after swallow;Moderate aspiration;Pharyngeal residue - valleculae;Pharyngeal residue - pyriform;Delayed swallow initiation-pyriform sinuses;Compensatory strategies attempted (with notebox)  Pharyngeal Material enters airway, passes BELOW cords without attempt by patient to eject out (silent aspiration)  Pharyngeal- Thin Straw NT  Pharyngeal --  Pharyngeal- Puree Reduced pharyngeal peristalsis;Reduced epiglottic inversion;Reduced laryngeal elevation;Pharyngeal residue - valleculae;Pharyngeal residue - pyriform;Reduced tongue base retraction  Pharyngeal Material does not enter airway  Pharyngeal- Mechanical Soft NT  Pharyngeal --  Pharyngeal- Regular  Reduced pharyngeal peristalsis;Reduced epiglottic inversion;Reduced laryngeal elevation;Pharyngeal residue - valleculae;Pharyngeal residue - pyriform  Pharyngeal Material does not enter airway  Pharyngeal- Multi-consistency NT  Pharyngeal --  Pharyngeal- Pill NT  Pharyngeal --  Pharyngeal Comment --     CHL IP CERVICAL ESOPHAGEAL PHASE 07/15/2020  Cervical Esophageal Phase WFL  Pudding Teaspoon --  Pudding Cup --  Honey Teaspoon --  Honey Cup --  Nectar Teaspoon --  Nectar Cup --  Nectar Straw --  Thin Teaspoon --  Thin Cup --  Thin Straw --  Puree --  Mechanical Soft --  Regular --  Multi-consistency --  Pill --  Cervical Esophageal Comment --   Ash Mcelwain H. Roddie Mc, CCC-SLP Speech Language Pathologist   Wende Bushy 07/15/2020, 2:37 PM  Wende Bushy 07/15/2020, 2:37 PM  [facility] [address] [place], [place], [address] Phone: [phone]   Fax:  [phone]  Name: Royce Stegman MRN: [id number] Date of Birth: December 16, 1927

## 2020-07-21 ENCOUNTER — Encounter (INDEPENDENT_AMBULATORY_CARE_PROVIDER_SITE_OTHER): Payer: Self-pay | Admitting: Internal Medicine

## 2020-07-21 ENCOUNTER — Other Ambulatory Visit: Payer: Self-pay

## 2020-07-21 ENCOUNTER — Ambulatory Visit (INDEPENDENT_AMBULATORY_CARE_PROVIDER_SITE_OTHER): Payer: Medicare Other | Admitting: Internal Medicine

## 2020-07-21 VITALS — BP 138/66 | HR 85 | Temp 98.6°F | Ht 77.0 in | Wt 148.8 lb

## 2020-07-21 DIAGNOSIS — R1312 Dysphagia, oropharyngeal phase: Secondary | ICD-10-CM | POA: Diagnosis not present

## 2020-07-21 DIAGNOSIS — E785 Hyperlipidemia, unspecified: Secondary | ICD-10-CM

## 2020-07-21 DIAGNOSIS — S72002D Fracture of unspecified part of neck of left femur, subsequent encounter for closed fracture with routine healing: Secondary | ICD-10-CM | POA: Diagnosis not present

## 2020-07-21 DIAGNOSIS — M48062 Spinal stenosis, lumbar region with neurogenic claudication: Secondary | ICD-10-CM | POA: Diagnosis not present

## 2020-07-21 DIAGNOSIS — R634 Abnormal weight loss: Secondary | ICD-10-CM

## 2020-07-21 DIAGNOSIS — N1831 Chronic kidney disease, stage 3a: Secondary | ICD-10-CM | POA: Diagnosis not present

## 2020-07-21 DIAGNOSIS — I129 Hypertensive chronic kidney disease with stage 1 through stage 4 chronic kidney disease, or unspecified chronic kidney disease: Secondary | ICD-10-CM | POA: Diagnosis not present

## 2020-07-21 DIAGNOSIS — Z431 Encounter for attention to gastrostomy: Secondary | ICD-10-CM | POA: Diagnosis not present

## 2020-07-21 DIAGNOSIS — R1314 Dysphagia, pharyngoesophageal phase: Secondary | ICD-10-CM | POA: Diagnosis not present

## 2020-07-21 NOTE — Patient Instructions (Signed)
Neurology consultation to be scheduled. Physician will call with results of blood test when completed. Can you increase amount of water to 24 ounces per day via G-tube.

## 2020-07-21 NOTE — Progress Notes (Signed)
Presenting complaint;  Patient interested in removing PEG tube.  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  I have not seen him recently.  I reviewed his records and contacted reevaluation by speech pathology prior to this visit. Patient was admitted to [facility] on 03/07/2020 for management of left hip fracture resulting from a fall.  He underwent surgery on 03/08/2020 and was discharged and transferred to [facility] for rehab.  Patient's recovery was very slow.  He had persistent cough and dysphagia.  It was felt that the symptoms were related to difficult intubation and he had edema to epiglottis.  However his swallowing did not improve and he lost close to 40 pounds.  Evaluation by speech pathology revealed significant oropharyngeal dysphagia and he was high risk for aspiration.  He therefore had gastrostomy tube placed by Dr. Arnoldo Hodges on 04/30/2020.  He was finally able to go home on 05/24/2020.  Since he has been home he has gained 11 pounds.  He was reevaluated by Ms. Dominic Hodges of speech pathology on 07/15/2020.  He still had reduced pharyngeal peristalsis reduced epiglottic inversion as well as reduced anterior laryngeal mobility and elevation.  He also had diminished retraction of tongue base and aspirated during swallow.  She recommended physical therapy which has not begun at home.  Patient was advised not to drink any liquids.  Patient states he can swallow mashed potatoes without any difficulty.  According to his daughter Dominic Hodges who is with him and also CNA Dominic Hodges it takes him a while to swallow.  He does have coughing spells not necessarily when he is eating and he also has difficulty clearing his throat of mucus.  He is not having any regurgitation or chest pain.  He is also not having any difficulty with gastrostomy tube.  Dressing is being changed every  day.  He is getting 8 cartons of Jevity and is only getting 16 ounces of water every 24 hours.  He does not have a good appetite.  He is not having any bowel problems.  He has urinary incontinence and is using pads/depends.  He is interested in going to oral feeding.  He is ambulating with a walker at home. Current Medications: Outpatient Encounter Medications as of 07/21/2020  Medication Sig   acetaminophen (TYLENOL) 160 MG/5ML elixir Place 325 mg into feeding tube every 6 (six) hours as needed for fever. Give 650 mg   Balsam [place]-Castor Oil (VENELEX) OINT Apply topically 3 (three) times daily. Topical, Every Shift, Apply to sacrum, coccyx and bilateral buttocks qshift for prevention.   Cholecalciferol (VITAMIN D) 125 MCG (5000 UT) CAPS 5,000 Units by PEG Tube route daily in the afternoon. 0900   cloNIDine (CATAPRES - DOSED IN MG/24 HR) 0.1 mg/24hr patch Place 1 patch (0.1 mg total) onto the skin once a week. Place on Fridays   famotidine (PEPCID) 40 MG/5ML suspension Take 2.5 mLs (20 mg total) by mouth at bedtime.   fluorometholone (FML) 0.1 % ophthalmic suspension Place 1 drop into both eyes 2 (two) times daily. 0900 & 2100   polyethylene glycol (MIRALAX / GLYCOLAX) 17 g packet Place 17 g into feeding tube daily as needed.   rosuvastatin (CRESTOR) 20 MG tablet Place 1 tablet (20 mg total) into feeding tube at bedtime. 2100   ondansetron (ZOFRAN) 4 MG tablet Take 1 tablet (4 mg total) by mouth every 6 (six)  hours as needed for nausea or vomiting. 1 tab, gastric tube, Every 6 Hours - PRN, for nausea (Patient not taking: Reported on 07/21/2020)   No facility-administered encounter medications on file as of 07/21/2020.   Past Medical History:  Diagnosis Date   Acid reflux    mild-states if doesnt chew food completely he begins to cough- states has weakness in muscles due to ilieus- has been doing exreci this   Adynamic ileus ([place]) 12/2014   ? anesthesia related per pt   BPH (benign prostatic  hyperplasia)    Complication of anesthesia    10/11/15- per pt "had [place]- in 11/16-related it to possibly anesthesia"   H/O seasonal allergies    Hyperlipidemia    Hypertension    Past Surgical History:  Procedure Laterality Date   APPENDECTOMY     approx 30 years ago/lwb   CATARACT EXTRACTION W/PHACO Left 09/11/2012   Procedure: CATARACT EXTRACTION PHACO AND INTRAOCULAR LENS PLACEMENT ([place]);  Surgeon: Tonny Branch, MD;  Location: [facility];  Service: Ophthalmology;  Laterality: Left;  CDE: 19.52   ESOPHAGOGASTRODUODENOSCOPY (EGD) WITH PROPOFOL N/A 05/03/2020   Procedure: ESOPHAGOGASTRODUODENOSCOPY (EGD) WITH PROPOFOL;  Surgeon: Aviva Signs, MD;  Location: [facility];  Service: General;  Laterality: N/A;   HIP ARTHROPLASTY Right 12/06/2014   Procedure: PARTIAL RIGHT HIP REPLACEMENT;  Surgeon: Carole Civil, MD;  Location: [facility];  Service: Orthopedics;  Laterality: Right;   HIP ARTHROPLASTY Left 03/08/2020   Procedure: ARTHROPLASTY BIPOLAR HIP (HEMIARTHROPLASTY);  Surgeon: Mordecai Rasmussen, MD;  Location: [facility];  Service: Orthopedics;  Laterality: Left;   LUMBAR LAMINECTOMY/DECOMPRESSION MICRODISCECTOMY N/A 10/19/2015   Procedure: CENTRAL DECOMPRESSION LUMBAR LAMINECTOMY FOR SPINAL STENOSIS, L3,L4FORAMINOTOMY BILATERAL L3,L4 NERVE ROOT;  Surgeon: Latanya Maudlin, MD;  Location: [facility];  Service: Orthopedics;  Laterality: N/A;   PEG PLACEMENT N/A 05/03/2020   Procedure: PERCUTANEOUS ENDOSCOPIC GASTROSTOMY (PEG) PLACEMENT;  Surgeon: Aviva Signs, MD;  Location: [facility];  Service: General;  Laterality: N/A;   UMBILICAL HERNIA REPAIR     Approx 10 years ago/lwb   ureteral stone extraction       Objective: Blood pressure 138/66, pulse 85, temperature 98.6 F (37 C), temperature source Oral, height _0  (1.956 m), weight 148 lb 12.8 oz (67.5 kg). Patient is alert and in no acute distress. He is wearing a mask. His speech is easily comprehended.  At times appears to  have liquid in his throat. Conjunctiva is pink. Sclera is nonicteric Oropharyngeal mucosa is normal. He has preserved pharyngeal reflux. No neck masses or thyromegaly noted. Cardiac exam with regular rhythm normal S1 and S2. No murmur or gallop noted. Lungs are clear to auscultation. Abdomen.  PEG tube is in place.  Tube entry site appears healthy.  On palpation abdomen is soft and nontender with organomegaly or masses. No LE edema or clubbing noted. He has good grip in both hands.   Labs/studies Results:   CBC Latest Ref Rng & Units 04/21/2020 04/15/2020 03/17/2020  WBC 4.0 - 10.5 K/uL 8.5 10.0 8.5  Hemoglobin 13.0 - 17.0 g/dL 11.9(L) 13.0 9.5(L)  Hematocrit 39.0 - 52.0 % 38.5(L) 41.2 28.7(L)  Platelets 150 - 400 K/uL 243 246 350    CMP Latest Ref Rng & Units 05/02/2020 04/21/2020 04/15/2020  Glucose 70 - 99 mg/dL 164(H) 100(H) 108(H)  BUN 8 - 23 mg/dL 11 39(H) 41(H)  Creatinine 0.61 - 1.24 mg/dL 0.91 1.11 1.17  Sodium 135 - 145 mmol/L 140 142 143  Potassium 3.5 -  5.1 mmol/L 3.6 3.9 4.2  Chloride 98 - 111 mmol/L 106 106 105  CO2 22 - 32 mmol/L _0 Calcium 8.9 - 10.3 mg/dL 8.6(L) 8.9 8.9  Total Protein 6.5 - 8.1 g/dL - 6.3(L) 6.7  Total Bilirubin 0.3 - 1.2 mg/dL - 0.8 1.0  Alkaline Phos 38 - 126 U/L - 116 115  AST 15 - 41 U/L - 17 19  ALT 0 - 44 U/L - 14 16    Hepatic Function Latest Ref Rng & Units 04/21/2020 04/15/2020 03/11/2020  Total Protein 6.5 - 8.1 g/dL 6.3(L) 6.7 -  Albumin 3.5 - 5.0 g/dL 3.4(L) 3.6 2.7(L)  AST 15 - 41 U/L 17 19 -  ALT 0 - 44 U/L 14 16 -  Alk Phosphatase 38 - 126 U/L 116 115 -  Total Bilirubin 0.3 - 1.2 mg/dL 0.8 1.0 -     CT angio head in October 08, 2018 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  Assessment:  #1.  Oropharyngeal dysphagia.  Evaluation last week clearly proves that he is at risk for aspiration and therefore it is not a good idea for him to resume oral feeding.  However he can try other  foods with consistency of mashed potatoes such as putting bananas and yogurt.  Etiology of oropharyngeal dysfunction has not been established.  He does not have any tremor suggest Parkinson's disease.  He does not have focal deficit to suggest stroke but he could have suprabulbar palsy.  It would be helpful to pinpoint the source as it may have therapeutic implications.  Given his age potential for significant recovery is very small.  It remains to be seen if gastric feeding will need to be indefinite. I had extensive discussion with him and his daughter Dominic Hodges.  He has seen Dr. Simone Curia in the past and I would request a reevaluation or he may recommend a referral to a neurologist with expertise in this kind of problem.  #2.  Review of lab data suggests he may be dehydrated.  He needs to be getting more free water.  #3.  History of hyperlipidemia.  He has not been taking rosuvastatin for several weeks.  He and his daughter Dominic Hodges are both interested find out if he needs to go back on the medication.  Plan:  Will check CBC, comprehensive chemistry panel and lipid panel. Patient will continue foods with consistency of mashed potatoes as discussed. Increase free water intake to 24 to 36 ounces via G-tube. Speech therapy at home as planned. Neurology consultation with Dr. Simone Curia. Office visit in 2 months.

## 2020-07-22 ENCOUNTER — Telehealth (INDEPENDENT_AMBULATORY_CARE_PROVIDER_SITE_OTHER): Payer: Self-pay

## 2020-07-22 LAB — LIPID PANEL
Cholesterol: 178 mg/dL (ref <200)
HDL: 46 mg/dL (ref >=40)
LDL Cholesterol (Calc): 113 mg/dL (calc) — ABNORMAL HIGH
Non-HDL Cholesterol (Calc): 132 mg/dL (calc) — ABNORMAL HIGH (ref <130)
Total CHOL/HDL Ratio: 3.9 (calc) (ref <5.0)
Triglycerides: 93 mg/dL (ref <150)

## 2020-07-22 LAB — COMPREHENSIVE METABOLIC PANEL
AG Ratio: 1.4 (calc) (ref 1.0–2.5)
ALT: 15 U/L (ref 9–46)
AST: 18 U/L (ref 10–35)
Albumin: 4 g/dL (ref 3.6–5.1)
Alkaline phosphatase (APISO): 70 U/L (ref 35–144)
BUN/Creatinine Ratio: 43 (calc) — ABNORMAL HIGH (ref 6–22)
BUN: 48 mg/dL — ABNORMAL HIGH (ref 7–25)
CO2: 28 mmol/L (ref 20–32)
Calcium: 9.2 mg/dL (ref 8.6–10.3)
Chloride: 102 mmol/L (ref 98–110)
Creat: 1.12 mg/dL (ref 0.70–1.22)
Globulin: 2.9 g/dL (calc) (ref 1.9–3.7)
Glucose, Bld: 67 mg/dL (ref 65–139)
Potassium: 4.6 mmol/L (ref 3.5–5.3)
Sodium: 139 mmol/L (ref 135–146)
Total Bilirubin: 0.5 mg/dL (ref 0.2–1.2)
Total Protein: 6.9 g/dL (ref 6.1–8.1)

## 2020-07-22 LAB — CBC
HCT: 39.6 % (ref 38.5–50.0)
Hemoglobin: 13.3 g/dL (ref 13.2–17.1)
MCH: 29.4 pg (ref 27.0–33.0)
MCHC: 33.6 g/dL (ref 32.0–36.0)
MCV: 87.6 fL (ref 80.0–100.0)
MPV: 11.5 fL (ref 7.5–12.5)
Platelets: 205 10*3/uL (ref 140–400)
RBC: 4.52 10*6/uL (ref 4.20–5.80)
RDW: 17.3 % — ABNORMAL HIGH (ref 11.0–15.0)
WBC: 8.2 10*3/uL (ref 3.8–10.8)

## 2020-07-22 NOTE — Telephone Encounter (Signed)
Per Dr. Laural Golden he will speak with Dr. Phoebe Perch regarding the patient, then he will speak with the patient regarding this.

## 2020-07-22 NOTE — Telephone Encounter (Signed)
Per Dr. Laural Golden he has left a message for Dr.Doonqhua and will let patient know if he will see him. If not we will refer to [facility]. Patient aware.

## 2020-07-22 NOTE — Telephone Encounter (Signed)
Patient called today to see if Dr. Laural Golden was going to refer him to another Doctor.

## 2020-07-26 DIAGNOSIS — E43 Unspecified severe protein-calorie malnutrition: Secondary | ICD-10-CM | POA: Diagnosis not present

## 2020-07-26 DIAGNOSIS — R1314 Dysphagia, pharyngoesophageal phase: Secondary | ICD-10-CM | POA: Diagnosis not present

## 2020-07-26 DIAGNOSIS — N1831 Chronic kidney disease, stage 3a: Secondary | ICD-10-CM | POA: Diagnosis not present

## 2020-07-26 DIAGNOSIS — Z931 Gastrostomy status: Secondary | ICD-10-CM | POA: Diagnosis not present

## 2020-07-26 DIAGNOSIS — S72002D Fracture of unspecified part of neck of left femur, subsequent encounter for closed fracture with routine healing: Secondary | ICD-10-CM | POA: Diagnosis not present

## 2020-07-26 DIAGNOSIS — I129 Hypertensive chronic kidney disease with stage 1 through stage 4 chronic kidney disease, or unspecified chronic kidney disease: Secondary | ICD-10-CM | POA: Diagnosis not present

## 2020-07-26 DIAGNOSIS — Z9181 History of falling: Secondary | ICD-10-CM | POA: Diagnosis not present

## 2020-07-27 DIAGNOSIS — E43 Unspecified severe protein-calorie malnutrition: Secondary | ICD-10-CM | POA: Diagnosis not present

## 2020-07-27 DIAGNOSIS — E782 Mixed hyperlipidemia: Secondary | ICD-10-CM | POA: Diagnosis not present

## 2020-07-27 DIAGNOSIS — R269 Unspecified abnormalities of gait and mobility: Secondary | ICD-10-CM | POA: Diagnosis not present

## 2020-07-27 DIAGNOSIS — E538 Deficiency of other specified B group vitamins: Secondary | ICD-10-CM | POA: Diagnosis not present

## 2020-07-27 DIAGNOSIS — F411 Generalized anxiety disorder: Secondary | ICD-10-CM | POA: Diagnosis not present

## 2020-07-27 DIAGNOSIS — N1831 Chronic kidney disease, stage 3a: Secondary | ICD-10-CM | POA: Diagnosis not present

## 2020-07-27 DIAGNOSIS — Z931 Gastrostomy status: Secondary | ICD-10-CM | POA: Diagnosis not present

## 2020-07-27 DIAGNOSIS — I129 Hypertensive chronic kidney disease with stage 1 through stage 4 chronic kidney disease, or unspecified chronic kidney disease: Secondary | ICD-10-CM | POA: Diagnosis not present

## 2020-07-27 DIAGNOSIS — N400 Enlarged prostate without lower urinary tract symptoms: Secondary | ICD-10-CM | POA: Diagnosis not present

## 2020-07-29 ENCOUNTER — Other Ambulatory Visit (HOSPITAL_COMMUNITY): Payer: Self-pay | Admitting: Neurology

## 2020-07-29 DIAGNOSIS — I10 Essential (primary) hypertension: Secondary | ICD-10-CM | POA: Diagnosis not present

## 2020-07-29 DIAGNOSIS — K219 Gastro-esophageal reflux disease without esophagitis: Secondary | ICD-10-CM | POA: Diagnosis not present

## 2020-07-29 DIAGNOSIS — R1312 Dysphagia, oropharyngeal phase: Secondary | ICD-10-CM | POA: Diagnosis not present

## 2020-07-29 DIAGNOSIS — E785 Hyperlipidemia, unspecified: Secondary | ICD-10-CM | POA: Diagnosis not present

## 2020-08-02 DIAGNOSIS — S72002D Fracture of unspecified part of neck of left femur, subsequent encounter for closed fracture with routine healing: Secondary | ICD-10-CM | POA: Diagnosis not present

## 2020-08-02 DIAGNOSIS — Z931 Gastrostomy status: Secondary | ICD-10-CM | POA: Diagnosis not present

## 2020-08-02 DIAGNOSIS — N1831 Chronic kidney disease, stage 3a: Secondary | ICD-10-CM | POA: Diagnosis not present

## 2020-08-02 DIAGNOSIS — E43 Unspecified severe protein-calorie malnutrition: Secondary | ICD-10-CM | POA: Diagnosis not present

## 2020-08-02 DIAGNOSIS — R1314 Dysphagia, pharyngoesophageal phase: Secondary | ICD-10-CM | POA: Diagnosis not present

## 2020-08-02 DIAGNOSIS — I129 Hypertensive chronic kidney disease with stage 1 through stage 4 chronic kidney disease, or unspecified chronic kidney disease: Secondary | ICD-10-CM | POA: Diagnosis not present

## 2020-08-04 DIAGNOSIS — Z931 Gastrostomy status: Secondary | ICD-10-CM | POA: Diagnosis not present

## 2020-08-04 DIAGNOSIS — S72002D Fracture of unspecified part of neck of left femur, subsequent encounter for closed fracture with routine healing: Secondary | ICD-10-CM | POA: Diagnosis not present

## 2020-08-04 DIAGNOSIS — E43 Unspecified severe protein-calorie malnutrition: Secondary | ICD-10-CM | POA: Diagnosis not present

## 2020-08-04 DIAGNOSIS — I129 Hypertensive chronic kidney disease with stage 1 through stage 4 chronic kidney disease, or unspecified chronic kidney disease: Secondary | ICD-10-CM | POA: Diagnosis not present

## 2020-08-04 DIAGNOSIS — N1831 Chronic kidney disease, stage 3a: Secondary | ICD-10-CM | POA: Diagnosis not present

## 2020-08-04 DIAGNOSIS — R1314 Dysphagia, pharyngoesophageal phase: Secondary | ICD-10-CM | POA: Diagnosis not present

## 2020-08-05 ENCOUNTER — Other Ambulatory Visit: Payer: Self-pay | Admitting: Physician Assistant

## 2020-08-08 DIAGNOSIS — N1831 Chronic kidney disease, stage 3a: Secondary | ICD-10-CM | POA: Diagnosis not present

## 2020-08-08 DIAGNOSIS — R1314 Dysphagia, pharyngoesophageal phase: Secondary | ICD-10-CM | POA: Diagnosis not present

## 2020-08-08 DIAGNOSIS — Z931 Gastrostomy status: Secondary | ICD-10-CM | POA: Diagnosis not present

## 2020-08-08 DIAGNOSIS — I129 Hypertensive chronic kidney disease with stage 1 through stage 4 chronic kidney disease, or unspecified chronic kidney disease: Secondary | ICD-10-CM | POA: Diagnosis not present

## 2020-08-08 DIAGNOSIS — S72002D Fracture of unspecified part of neck of left femur, subsequent encounter for closed fracture with routine healing: Secondary | ICD-10-CM | POA: Diagnosis not present

## 2020-08-08 DIAGNOSIS — E43 Unspecified severe protein-calorie malnutrition: Secondary | ICD-10-CM | POA: Diagnosis not present

## 2020-08-09 ENCOUNTER — Other Ambulatory Visit (INDEPENDENT_AMBULATORY_CARE_PROVIDER_SITE_OTHER): Payer: Self-pay

## 2020-08-09 DIAGNOSIS — N183 Chronic kidney disease, stage 3 unspecified: Secondary | ICD-10-CM

## 2020-08-09 DIAGNOSIS — N1831 Chronic kidney disease, stage 3a: Secondary | ICD-10-CM

## 2020-08-10 ENCOUNTER — Ambulatory Visit (HOSPITAL_COMMUNITY)
Admission: RE | Admit: 2020-08-10 | Discharge: 2020-08-10 | Disposition: A | Discharge status: Home / Self Care | Payer: Medicare Other | Source: Ambulatory Visit | Attending: Neurology | Admitting: Neurology

## 2020-08-10 ENCOUNTER — Other Ambulatory Visit: Payer: Self-pay

## 2020-08-10 ENCOUNTER — Other Ambulatory Visit (HOSPITAL_COMMUNITY)
Admission: RE | Admit: 2020-08-10 | Discharge: 2020-08-10 | Disposition: A | Discharge status: Home / Self Care | Payer: Medicare Other | Source: Ambulatory Visit | Attending: Internal Medicine | Admitting: Internal Medicine

## 2020-08-10 DIAGNOSIS — S72002D Fracture of unspecified part of neck of left femur, subsequent encounter for closed fracture with routine healing: Secondary | ICD-10-CM | POA: Diagnosis not present

## 2020-08-10 DIAGNOSIS — R1312 Dysphagia, oropharyngeal phase: Secondary | ICD-10-CM | POA: Insufficient documentation

## 2020-08-10 DIAGNOSIS — R1310 Dysphagia, unspecified: Secondary | ICD-10-CM | POA: Diagnosis not present

## 2020-08-10 DIAGNOSIS — G319 Degenerative disease of nervous system, unspecified: Secondary | ICD-10-CM | POA: Diagnosis not present

## 2020-08-10 DIAGNOSIS — E43 Unspecified severe protein-calorie malnutrition: Secondary | ICD-10-CM | POA: Diagnosis not present

## 2020-08-10 DIAGNOSIS — N1831 Chronic kidney disease, stage 3a: Secondary | ICD-10-CM

## 2020-08-10 DIAGNOSIS — R531 Weakness: Secondary | ICD-10-CM | POA: Diagnosis not present

## 2020-08-10 DIAGNOSIS — R1314 Dysphagia, pharyngoesophageal phase: Secondary | ICD-10-CM | POA: Diagnosis not present

## 2020-08-10 DIAGNOSIS — Z931 Gastrostomy status: Secondary | ICD-10-CM | POA: Diagnosis not present

## 2020-08-10 DIAGNOSIS — I129 Hypertensive chronic kidney disease with stage 1 through stage 4 chronic kidney disease, or unspecified chronic kidney disease: Secondary | ICD-10-CM | POA: Diagnosis not present

## 2020-08-10 LAB — BASIC METABOLIC PANEL
Anion gap: 8 (ref 5–15)
BUN: 49 mg/dL — ABNORMAL HIGH (ref 8–23)
CO2: 27 mmol/L (ref 22–32)
Calcium: 9 mg/dL (ref 8.9–10.3)
Chloride: 103 mmol/L (ref 98–111)
Creatinine, Ser: 1.1 mg/dL (ref 0.61–1.24)
GFR, Estimated: >60 mL/min (ref >60)
Glucose, Bld: 141 mg/dL — ABNORMAL HIGH (ref 70–99)
Potassium: 4.5 mmol/L (ref 3.5–5.1)
Sodium: 138 mmol/L (ref 135–145)

## 2020-08-11 ENCOUNTER — Telehealth: Payer: Self-pay | Admitting: Neurology

## 2020-08-12 ENCOUNTER — Other Ambulatory Visit (INDEPENDENT_AMBULATORY_CARE_PROVIDER_SITE_OTHER): Payer: Self-pay

## 2020-08-12 ENCOUNTER — Telehealth (INDEPENDENT_AMBULATORY_CARE_PROVIDER_SITE_OTHER): Payer: Self-pay

## 2020-08-12 DIAGNOSIS — R633 Feeding difficulties, unspecified: Secondary | ICD-10-CM

## 2020-08-12 DIAGNOSIS — N1831 Chronic kidney disease, stage 3a: Secondary | ICD-10-CM

## 2020-08-12 DIAGNOSIS — E43 Unspecified severe protein-calorie malnutrition: Secondary | ICD-10-CM

## 2020-08-12 DIAGNOSIS — R1314 Dysphagia, pharyngoesophageal phase: Secondary | ICD-10-CM

## 2020-08-12 DIAGNOSIS — R7309 Other abnormal glucose: Secondary | ICD-10-CM

## 2020-08-12 NOTE — Telephone Encounter (Signed)
Dr Weston Anna is calling asking how much water intake did you advise Dr [name] to be getting now, she has forgotten what you said she is thinking 80 ounces but not sure ? Please advise?

## 2020-08-15 DIAGNOSIS — X32XXXD Exposure to sunlight, subsequent encounter: Secondary | ICD-10-CM | POA: Diagnosis not present

## 2020-08-15 DIAGNOSIS — L570 Actinic keratosis: Secondary | ICD-10-CM | POA: Diagnosis not present

## 2020-08-15 DIAGNOSIS — B9689 Other specified bacterial agents as the cause of diseases classified elsewhere: Secondary | ICD-10-CM | POA: Diagnosis not present

## 2020-08-15 DIAGNOSIS — L02423 Furuncle of right upper limb: Secondary | ICD-10-CM | POA: Diagnosis not present

## 2020-08-15 NOTE — Telephone Encounter (Signed)
MRI discussed and asa started.

## 2020-08-16 DIAGNOSIS — S72002D Fracture of unspecified part of neck of left femur, subsequent encounter for closed fracture with routine healing: Secondary | ICD-10-CM | POA: Diagnosis not present

## 2020-08-16 DIAGNOSIS — E43 Unspecified severe protein-calorie malnutrition: Secondary | ICD-10-CM | POA: Diagnosis not present

## 2020-08-16 DIAGNOSIS — N1831 Chronic kidney disease, stage 3a: Secondary | ICD-10-CM | POA: Diagnosis not present

## 2020-08-16 DIAGNOSIS — I129 Hypertensive chronic kidney disease with stage 1 through stage 4 chronic kidney disease, or unspecified chronic kidney disease: Secondary | ICD-10-CM | POA: Diagnosis not present

## 2020-08-16 DIAGNOSIS — R1314 Dysphagia, pharyngoesophageal phase: Secondary | ICD-10-CM | POA: Diagnosis not present

## 2020-08-16 DIAGNOSIS — Z931 Gastrostomy status: Secondary | ICD-10-CM | POA: Diagnosis not present

## 2020-08-17 ENCOUNTER — Telehealth (INDEPENDENT_AMBULATORY_CARE_PROVIDER_SITE_OTHER): Payer: Self-pay | Admitting: *Deleted

## 2020-08-17 NOTE — Telephone Encounter (Signed)
Pt's daughter susan cotter calling because she was not sure how much water he should be getting in daily. She thought dr Laural Golden told her 80 ounces but was not sure and that seemed like a lot so she wanted to make sure. I read last ov note from July and it said 24 to 36 ounces via g tube and told her that. She said at the office visit she told dr Laural Golden he was getting less than he actually was and he was already getting 24 ounces. He is now taking in around 36 -40 ounces she thinks. States she is not really sure because he has several caregivers and not sure everyone is on the same page. She would like to know how much he should be getting and she will make sure all caregivers know exactly how much to give.

## 2020-08-18 ENCOUNTER — Other Ambulatory Visit (INDEPENDENT_AMBULATORY_CARE_PROVIDER_SITE_OTHER): Payer: Self-pay | Admitting: *Deleted

## 2020-08-18 DIAGNOSIS — N1831 Chronic kidney disease, stage 3a: Secondary | ICD-10-CM

## 2020-08-18 DIAGNOSIS — E43 Unspecified severe protein-calorie malnutrition: Secondary | ICD-10-CM

## 2020-08-18 DIAGNOSIS — R7309 Other abnormal glucose: Secondary | ICD-10-CM

## 2020-08-18 NOTE — Telephone Encounter (Signed)
Discussed with pt's daughter and she verbalized understanding.

## 2020-08-18 NOTE — Telephone Encounter (Signed)
Discussed with Dr. Laural Golden and he states he needs at least 60 ounces and do bw when he goes to therapy in a couple of weeks. Wanted to do bw at [place] order changed from [facility] to [facility] and mailed to [facility].

## 2020-08-21 ENCOUNTER — Encounter (HOSPITAL_COMMUNITY): Payer: Self-pay

## 2020-08-21 ENCOUNTER — Other Ambulatory Visit: Payer: Self-pay

## 2020-08-21 ENCOUNTER — Emergency Department (HOSPITAL_COMMUNITY)
Admission: EM | Admit: 2020-08-21 | Discharge: 2020-08-21 | Disposition: A | Discharge status: Home / Self Care | Payer: Medicare Other | Attending: Emergency Medicine | Admitting: Emergency Medicine

## 2020-08-21 DIAGNOSIS — J069 Acute upper respiratory infection, unspecified: Secondary | ICD-10-CM | POA: Diagnosis not present

## 2020-08-21 DIAGNOSIS — R0981 Nasal congestion: Secondary | ICD-10-CM | POA: Diagnosis present

## 2020-08-21 DIAGNOSIS — Z20822 Contact with and (suspected) exposure to covid-19: Secondary | ICD-10-CM | POA: Diagnosis not present

## 2020-08-21 DIAGNOSIS — Z79899 Other long term (current) drug therapy: Secondary | ICD-10-CM | POA: Insufficient documentation

## 2020-08-21 DIAGNOSIS — Z96643 Presence of artificial hip joint, bilateral: Secondary | ICD-10-CM | POA: Insufficient documentation

## 2020-08-21 DIAGNOSIS — J3489 Other specified disorders of nose and nasal sinuses: Secondary | ICD-10-CM | POA: Insufficient documentation

## 2020-08-21 DIAGNOSIS — I129 Hypertensive chronic kidney disease with stage 1 through stage 4 chronic kidney disease, or unspecified chronic kidney disease: Secondary | ICD-10-CM | POA: Insufficient documentation

## 2020-08-21 DIAGNOSIS — N183 Chronic kidney disease, stage 3 unspecified: Secondary | ICD-10-CM | POA: Insufficient documentation

## 2020-08-21 DIAGNOSIS — Z87891 Personal history of nicotine dependence: Secondary | ICD-10-CM | POA: Diagnosis not present

## 2020-08-21 LAB — RESP PANEL BY RT-PCR (FLU A&B, COVID) ARPGX2
Influenza A by PCR: NEGATIVE
Influenza B by PCR: NEGATIVE
SARS Coronavirus 2 by RT PCR: NEGATIVE

## 2020-08-21 MED ORDER — AZITHROMYCIN 250 MG PO TABS
500.0000 mg | ORAL_TABLET | Freq: Once | ORAL | Status: AC
  Administered 2020-08-21: 500 mg via ORAL
  Filled 2020-08-21: qty 2

## 2020-08-21 MED ORDER — AZITHROMYCIN 250 MG PO TABS: 250.0000 mg | ORAL_TABLET | Freq: Every day | ORAL | 0 refills | Status: DC

## 2020-08-21 MED ORDER — BENZONATATE 100 MG PO CAPS
100.0000 mg | ORAL_CAPSULE | Freq: Three times a day (TID) | ORAL | 0 refills | Status: DC
Start: 2020-08-21 — End: 2021-08-27

## 2020-08-21 NOTE — ED Triage Notes (Signed)
Pt c/o of sore throat with a cough. States he cannot sleep due to coughing fits.

## 2020-08-21 NOTE — Discharge Instructions (Addendum)
Begin taking Zithromax as prescribed.  Stop taking doxycycline as previously prescribed.  Use Tessalon as prescribed as needed for cough.  Take Sudafed as per package instructions as needed for congestion.  This medication is available over-the-counter, but needs to be requested at the pharmacy desk.  Use a humidifier in your room at night as this may help to ease your congestion.

## 2020-08-21 NOTE — ED Provider Notes (Signed)
[facility] EMERGENCY DEPARTMENT Provider Note   CSN: [id number] Arrival date & time: 08/21/20  0222     History Chief Complaint  Patient presents with   Sore Throat    Dominic Hodges is a 87 y.o. male.  Patient is a 87 year old male with past medical history of hypertension, hyperlipidemia, left hip replacement, and GERD.  Patient presenting today for evaluation of nasal congestion.  This started yesterday and is rapidly worsening.  He describes difficulty sleeping secondary to drainage.  He denies any chest pain or difficulty breathing.  He denies any fevers or chills.  He denies any ill contacts.  The history is provided by the patient.      Past Medical History:  Diagnosis Date   Acid reflux    mild-states if doesnt chew food completely he begins to cough- states has weakness in muscles due to ilieus- has been doing exreci this   Adynamic ileus ([place]) 12/2014   ? anesthesia related per pt   BPH (benign prostatic hyperplasia)    Complication of anesthesia    10/11/15- per pt "had [place]- in 11/16-related it to possibly anesthesia"   H/O seasonal allergies    Hyperlipidemia    Hypertension     Patient Active Problem List   Diagnosis Date Noted   Neurocognitive deficits 04/28/2020   History of anesthesia complications 57/01/7791   Recent weight loss 04/14/2020   Normochromic normocytic anemia 04/14/2020   Dysphagia, oropharyngeal 04/14/2020   Chronic cough 04/13/2020   Anorexia 04/07/2020   Failure to thrive in adult 04/07/2020   Protein-calorie malnutrition, severe ([place]) 03/16/2020   Chronic constipation 03/15/2020   Acute blood loss anemia 03/15/2020   Thrombocytopenia ([place]) 03/15/2020   Closed left femoral fracture ([place]) 03/07/2020   Accidental fall 03/07/2020   CKD (chronic kidney disease), stage III ([place]) 03/07/2020   Spinal stenosis, lumbar region, with neurogenic claudication 10/19/2015   Closed right hip fracture ([place])  12/05/2014   Closed hip fracture ([place]) 12/05/2014   BPH (benign prostatic hyperplasia) 12/05/2014   Hyperlipidemia 12/05/2014   Essential hypertension, benign 06/01/2010    Past Surgical History:  Procedure Laterality Date   APPENDECTOMY     approx 30 years ago/lwb   CATARACT EXTRACTION W/PHACO Left 09/11/2012   Procedure: CATARACT EXTRACTION PHACO AND INTRAOCULAR LENS PLACEMENT ([place]);  Surgeon: Tonny Branch, MD;  Location: [facility];  Service: Ophthalmology;  Laterality: Left;  CDE: 19.52   ESOPHAGOGASTRODUODENOSCOPY (EGD) WITH PROPOFOL N/A 05/03/2020   Procedure: ESOPHAGOGASTRODUODENOSCOPY (EGD) WITH PROPOFOL;  Surgeon: Aviva Signs, MD;  Location: [facility];  Service: General;  Laterality: N/A;   HIP ARTHROPLASTY Right 12/06/2014   Procedure: PARTIAL RIGHT HIP REPLACEMENT;  Surgeon: Carole Civil, MD;  Location: [facility];  Service: Orthopedics;  Laterality: Right;   HIP ARTHROPLASTY Left 03/08/2020   Procedure: ARTHROPLASTY BIPOLAR HIP (HEMIARTHROPLASTY);  Surgeon: Mordecai Rasmussen, MD;  Location: [facility];  Service: Orthopedics;  Laterality: Left;   LUMBAR LAMINECTOMY/DECOMPRESSION MICRODISCECTOMY N/A 10/19/2015   Procedure: CENTRAL DECOMPRESSION LUMBAR LAMINECTOMY FOR SPINAL STENOSIS, L3,L4FORAMINOTOMY BILATERAL L3,L4 NERVE ROOT;  Surgeon: Latanya Maudlin, MD;  Location: [facility];  Service: Orthopedics;  Laterality: N/A;   PEG PLACEMENT N/A 05/03/2020   Procedure: PERCUTANEOUS ENDOSCOPIC GASTROSTOMY (PEG) PLACEMENT;  Surgeon: Aviva Signs, MD;  Location: [facility];  Service: General;  Laterality: N/A;   UMBILICAL HERNIA REPAIR     Approx 10 years ago/lwb   ureteral stone extraction  Family History  Problem Relation Age of Onset   Coronary artery disease Other        no family history of premature CAD    Social History   Tobacco Use   Smoking status: Former    Types: Pipe    Quit date: 01/09/1959    Years since quitting: 61.6   Smokeless tobacco: Never   Tobacco comments:    smoked  a pipe not sure how long  Vaping Use   Vaping Use: Never used  Substance Use Topics   Alcohol use: Yes    Comment: occasional social drink/lwb   Drug use: No    Home Medications Prior to Admission medications   Medication Sig Start Date End Date Taking? Authorizing Provider  acetaminophen (TYLENOL) 160 MG/5ML elixir Place 325 mg into feeding tube every 6 (six) hours as needed for fever. Give 650 mg    [provider]  Cholecalciferol (VITAMIN D) 125 MCG (5000 UT) CAPS 5,000 Units by PEG Tube route daily in the afternoon. 0900    [provider]  cloNIDine (CATAPRES - DOSED IN MG/24 HR) 0.1 mg/24hr patch Place 1 patch (0.1 mg total) onto the skin once a week. Place on Fridays 05/24/20   Gerlene Fee, NP  famotidine (PEPCID) 40 MG/5ML suspension Take 2.5 mLs (20 mg total) by mouth at bedtime. 06/08/20   Tanda Rockers, MD  fluorometholone (FML) 0.1 % ophthalmic suspension Place 1 drop into both eyes 2 (two) times daily. 0900 & 2100 05/24/20   Gerlene Fee, NP  polyethylene glycol (MIRALAX / GLYCOLAX) 17 g packet Place 17 g into feeding tube daily as needed.    [provider]  rosuvastatin (CRESTOR) 20 MG tablet Place 1 tablet (20 mg total) into feeding tube at bedtime. 2100 05/24/20   Gerlene Fee, NP    Allergies    Robaxin [methocarbamol]  Review of Systems   Review of Systems  All other systems reviewed and are negative.  Physical Exam Updated Vital Signs Ht 6\' 5"  (1.956 m)   Wt 68 kg   BMI 17.79 kg/m   Physical Exam Vitals and nursing note reviewed.  Constitutional:      General: He is not in acute distress.    Appearance: He is well-developed. He is not diaphoretic.  HENT:     Head: Normocephalic and atraumatic.     Nose: Congestion and rhinorrhea present.     Mouth/Throat:     Mouth: Mucous membranes are moist.     Pharynx: No pharyngeal swelling.  Cardiovascular:     Rate and Rhythm: Normal rate and regular rhythm.     Heart  sounds: No murmur heard.   No friction rub.  Pulmonary:     Effort: Pulmonary effort is normal. No respiratory distress.     Breath sounds: Normal breath sounds. No wheezing or rales.  Abdominal:     General: Bowel sounds are normal. There is no distension.     Palpations: Abdomen is soft.     Tenderness: There is no abdominal tenderness.  Musculoskeletal:        General: Normal range of motion.     Cervical back: Normal range of motion and neck supple.  Skin:    General: Skin is warm and dry.  Neurological:     Mental Status: He is alert and oriented to person, place, and time.     Coordination: Coordination normal.    ED Results / Procedures / Treatments  Labs (all labs ordered are listed, but only abnormal results are displayed) Labs Reviewed  RESP PANEL BY RT-PCR (FLU A&B, COVID) ARPGX2    EKG None  Radiology No results found.  Procedures Procedures   Medications Ordered in ED Medications - No data to display  ED Course  I have reviewed the triage vital signs and the nursing notes.  Pertinent labs & imaging results that were available during my care of the patient were reviewed by me and considered in my medical decision making (see chart for details).    MDM Rules/Calculators/A&P  Patient presenting with concerns of congestion.  COVID is negative and he appears clinically well otherwise.  He is concerned this is a reaction to the doxycycline he was prescribed.  Antibiotic will be changed to Zithromax.  He will be given Tessalon and advised to take Sudafed as a decongestant.  To follow-up as needed.  Final Clinical Impression(s) / ED Diagnoses Final diagnoses:  None    Rx / DC Orders ED Discharge Orders     None        Veryl Speak, MD 08/21/20 [phone]

## 2020-08-24 ENCOUNTER — Ambulatory Visit: Payer: Medicare Other | Admitting: Orthopedic Surgery

## 2020-08-30 ENCOUNTER — Ambulatory Visit (HOSPITAL_COMMUNITY): Payer: Medicare Other | Admitting: Speech Pathology

## 2020-08-31 ENCOUNTER — Ambulatory Visit (HOSPITAL_COMMUNITY): Payer: Medicare Other | Admitting: Speech Pathology

## 2020-09-14 ENCOUNTER — Ambulatory Visit (HOSPITAL_COMMUNITY): Payer: Medicare Other | Attending: Internal Medicine | Admitting: Speech Pathology

## 2020-09-14 ENCOUNTER — Other Ambulatory Visit: Payer: Self-pay

## 2020-09-14 DIAGNOSIS — R1312 Dysphagia, oropharyngeal phase: Secondary | ICD-10-CM | POA: Insufficient documentation

## 2020-09-19 ENCOUNTER — Other Ambulatory Visit (HOSPITAL_COMMUNITY): Payer: Self-pay | Admitting: Internal Medicine

## 2020-09-19 ENCOUNTER — Other Ambulatory Visit: Payer: Self-pay

## 2020-09-19 ENCOUNTER — Ambulatory Visit (HOSPITAL_COMMUNITY)
Admission: RE | Admit: 2020-09-19 | Discharge: 2020-09-19 | Disposition: A | Discharge status: Home / Self Care | Payer: Medicare Other | Source: Ambulatory Visit | Attending: Internal Medicine | Admitting: Internal Medicine

## 2020-09-19 DIAGNOSIS — R059 Cough, unspecified: Secondary | ICD-10-CM | POA: Diagnosis not present

## 2020-09-19 DIAGNOSIS — E43 Unspecified severe protein-calorie malnutrition: Secondary | ICD-10-CM | POA: Diagnosis not present

## 2020-09-19 DIAGNOSIS — R7309 Other abnormal glucose: Secondary | ICD-10-CM | POA: Diagnosis not present

## 2020-09-19 DIAGNOSIS — N1831 Chronic kidney disease, stage 3a: Secondary | ICD-10-CM | POA: Diagnosis not present

## 2020-09-20 ENCOUNTER — Encounter (HOSPITAL_COMMUNITY): Payer: Self-pay | Admitting: Speech Pathology

## 2020-09-20 DIAGNOSIS — R053 Chronic cough: Secondary | ICD-10-CM | POA: Diagnosis not present

## 2020-09-20 DIAGNOSIS — E43 Unspecified severe protein-calorie malnutrition: Secondary | ICD-10-CM | POA: Diagnosis not present

## 2020-09-20 DIAGNOSIS — R1310 Dysphagia, unspecified: Secondary | ICD-10-CM | POA: Diagnosis not present

## 2020-09-20 DIAGNOSIS — Z9189 Other specified personal risk factors, not elsewhere classified: Secondary | ICD-10-CM | POA: Diagnosis not present

## 2020-09-20 LAB — HEMOGLOBIN A1C
Est. average glucose Bld gHb Est-mCnc: 131 mg/dL
Hgb A1c MFr Bld: 6.2 % — ABNORMAL HIGH (ref 4.8–5.6)

## 2020-09-20 LAB — BASIC METABOLIC PANEL
BUN/Creatinine Ratio: 36 — ABNORMAL HIGH (ref 10–24)
BUN: 40 mg/dL — ABNORMAL HIGH (ref 10–36)
CO2: 23 mmol/L (ref 20–29)
Calcium: 8.9 mg/dL (ref 8.6–10.2)
Chloride: 97 mmol/L (ref 96–106)
Creatinine, Ser: 1.11 mg/dL (ref 0.76–1.27)
Glucose: 122 mg/dL — ABNORMAL HIGH (ref 65–99)
Potassium: 5.1 mmol/L (ref 3.5–5.2)
Sodium: 137 mmol/L (ref 134–144)
eGFR: 63 mL/min/{1.73_m2} (ref >59)

## 2020-09-20 NOTE — Therapy (Signed)
[facility] [address] [place], [place], [address] Phone: [phone]   Fax:  [phone]  Speech Language Pathology Evaluation  Patient Details  Name: Dominic Hodges MRN: [id number] Date of Birth: [birth year] No data recorded  Encounter Date: 09/14/2020   End of Session - 09/14/20 0917     Visit Number 1    Number of Visits 16    Authorization Type No auth reqd; No OOP Max, Plan will follow [facility] guidelines, Plan will cover $233.00 mcade ded, plan will cover 20% of the mcare approve amt    SLP Start Time 1519    SLP Stop Time  1606    SLP Time Calculation (min) 47 min    Activity Tolerance Patient tolerated treatment well             Past Medical History:  Diagnosis Date   Acid reflux    mild-states if doesnt chew food completely he begins to cough- states has weakness in muscles due to ilieus- has been doing exreci this   Adynamic ileus ([place]) 12/2014   ? anesthesia related per pt   BPH (benign prostatic hyperplasia)    Complication of anesthesia    10/11/15- per pt "had [place]- in 11/16-related it to possibly anesthesia"   H/O seasonal allergies    Hyperlipidemia    Hypertension     Past Surgical History:  Procedure Laterality Date   APPENDECTOMY     approx 30 years ago/lwb   CATARACT EXTRACTION W/PHACO Left 09/11/2012   Procedure: CATARACT EXTRACTION PHACO AND INTRAOCULAR LENS PLACEMENT ([place]);  Surgeon: Dominic Branch, MD;  Location: [facility];  Service: Ophthalmology;  Laterality: Left;  CDE: 19.52   ESOPHAGOGASTRODUODENOSCOPY (EGD) WITH PROPOFOL N/A 05/03/2020   Procedure: ESOPHAGOGASTRODUODENOSCOPY (EGD) WITH PROPOFOL;  Surgeon: Dominic Signs, MD;  Location: [facility];  Service: General;  Laterality: N/A;   HIP ARTHROPLASTY Right 12/06/2014   Procedure: PARTIAL RIGHT HIP REPLACEMENT;  Surgeon: Dominic Civil, MD;  Location: [facility];  Service: Orthopedics;  Laterality: Right;   HIP ARTHROPLASTY  Left 03/08/2020   Procedure: ARTHROPLASTY BIPOLAR HIP (HEMIARTHROPLASTY);  Surgeon: Dominic Rasmussen, MD;  Location: [facility];  Service: Orthopedics;  Laterality: Left;   LUMBAR LAMINECTOMY/DECOMPRESSION MICRODISCECTOMY N/A 10/19/2015   Procedure: CENTRAL DECOMPRESSION LUMBAR LAMINECTOMY FOR SPINAL STENOSIS, L3,L4FORAMINOTOMY BILATERAL L3,L4 NERVE ROOT;  Surgeon: Dominic Maudlin, MD;  [facility];  Service: Orthopedics;  Laterality: N/A;   PEG PLACEMENT N/A 05/03/2020   Procedure: PERCUTANEOUS ENDOSCOPIC GASTROSTOMY (PEG) PLACEMENT;  Surgeon: Dominic Signs, MD;  Location: [facility];  Service: General;  Laterality: N/A;   UMBILICAL HERNIA REPAIR     Approx 10 years ago/lwb   ureteral stone extraction      There were no vitals filed for this visit.   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  Significant medical history includes essential hypertension, dyslipidemia, nephrolithiasis, BPH, and GERD. Per Pt report, he has had dysphagia and cough since intubation for a laminectomy in 2017. After his admission to [facility] (2022), Pt experience significant weight loss over ~ 1 month and complained of dysphagia; He was eventually referred for an MBSS.  That MBSS 04/27/20 revealed severe dysphagia with recommendation for alternative means  of nutrition; PEG tube was placed on 05/03/20, after tube feedings were initiated, Pt's weight remained stable and he has since gained some weight. Pt has not had much speech therapy follow up and has remained NPO since MBSS was completed. MBS was completed on 07/15/20 to re-assess swallowing function and determine PO readiness with recommendations detailed below:  MBSS 07/15/2020: << Pt presents with moderate/severe sensorimotor dysphagia characterized  by silent aspiration of all liquid consistencies assessed (thin, NTL and HTL). Note some improvement from recent MBSS in that no aspiration of solid textures was noted today, and mildly decreased pharyngeal residue with solid textures was also noted from severe (last study) to moderate (today). Pt continues to demonstrate poor laryngeal vestibule closure, decreased BOT retraction, decreased epiglottic deflection, and decreased pharyngeal squeeze. Thin and NTL were aspirated before during and after the swallow - trace to moderate amounts and attempted strategies (chin tuck and effortful swallow) were not effective in decreasing or eliminating penetration/aspiration. Honey thick liquids were not initially aspirated, however, mild to moderate pyriform residue was eventually aspirated after the swallow. Puree and regular textures were consumed with moderate valleculae and pyriform residue, requiring 5-10 additional swallows to clear residue; note NO penetration or aspiration of solid textures was visualized. Recommend initiate a solid diet of puree/minced textures with no liquids at this time. Recommend, however, free water protocol: *permit regular water in between meals after oral care*. Recommend initiate [place] speech therapy for pharyngeal strengthening exercises, consider initiate EMST training and repeat MBSS when clinically appropriate. SLP reviewed recommendations and findings with Pt and Pt's daughter Dominic Hodges) at length and provided written recommendations. Recommend consider RD consult to assess Pt's weight gain, calorie count/intake and consideration of titrating feeds based on nutritional needs.  Consulted with [facility] (Pt is currently receiving HH PT and OT from this agency) to recommend initiating [place]. If needs cannot be met with [facility] recommend transition to an agency that can provide all medically necessary disciplines. OP ST is also an option if Pt is physically able  and transporation is not an issue.>>   Oral Motor/Sensory Function  Overall Oral Motor/Sensory Function: Within functional limits  Consistency Results     Thin Liquid Thin Liquid: Not tested  Nectar Thick Liquid Nectar Thick Liquid: Not tested  Honey Thick Liquid Honey Thick Liquid: Not tested  Puree Puree: Impaired Presentation: Spoon;Self Fed Pharyngeal Phase Impairments: Multiple swallows;Wet Vocal Quality;Throat Clearing - Delayed  SLP Goals     Assessment/Plan  Patient Active Problem List   Diagnosis Date Noted   Neurocognitive deficits 04/28/2020   History of anesthesia complications 65/78/4696   Recent weight loss 04/14/2020   Normochromic normocytic anemia 04/14/2020   Dysphagia, oropharyngeal 04/14/2020   Chronic cough 04/13/2020   Anorexia 04/07/2020   Failure to thrive in adult 04/07/2020   Protein-calorie malnutrition, severe ([place]) 03/16/2020   Chronic constipation 03/15/2020   Acute blood loss anemia 03/15/2020   Thrombocytopenia ([place]) 03/15/2020   Closed left femoral fracture ([place]) 03/07/2020   Accidental fall 03/07/2020   CKD (chronic kidney disease), stage III ([place]) 03/07/2020   Spinal stenosis, lumbar region, with neurogenic claudication 10/19/2015   Closed right hip fracture ([place]) 12/05/2014   Closed hip fracture ([place]) 12/05/2014   BPH (benign prostatic hyperplasia) 12/05/2014   Hyperlipidemia 12/05/2014   Essential hypertension, benign 06/01/2010           SLP Short Term Goals - 09/14/20 0935       SLP SHORT TERM  GOAL #1   Title Pt will complete home exercise Plan including Modified [place], [place] and effortful swallow 2X/daily per caregiver report    Baseline n/a    Time 8    Period Weeks    Status New      SLP SHORT TERM GOAL #2   Title Will complete EMST evaluation (family plans to order prior to next visit) and determine approprate goals for daily completion of EMST    Baseline n/a    Time 8    Period Weeks      SLP SHORT  TERM GOAL #3   Title Caregivers will report PO consumption 2X daily plus free water 2X daily - will be reported on chart provided by SLP    Baseline reports PO is very minimal ( a few bites one time per day and free water not every day )    Time 8    Period Weeks    Status New      SLP SHORT TERM GOAL #4   Title Pt will consume puree textures during ST session with noted usage of strategies with minimal cues and support from SLP    Baseline not tested    Time 8    Period Weeks    Status New              SLP Long Term Goals - 09/14/20 0936       SLP LONG TERM GOAL #1   Title Pt will consume puree textures and free water with optimum safety and efficiency without overt s/sx of aspiration and with consistent usage of recommended strategies in order to improve Pt's overall QOL    Time 8    Period Weeks    Status New              Plan - 09/14/20 0947     Clinical Impression Statement 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 Pt receives all his nutrition and hydration via PEG tube but has incorporated some PO trials (puree and free water) occasionally since recent MBS in July (detailed above). Pt reports he is "never hungry" so he has to "make himself eat". Pt consumed trials of puree textures during evaluation today and note piecemeal swallowing and residual wet vocal quality despite multiple swallow and throat clears; Pt required verbal cues to cough/clear wet vocal quality. SLP reviewed and demonstrated exercises recommended to be completed daily Modified [place] (Modified d/t Pt being unable to lay flat - PEG tube), [place] and effortful swallow. SLP also recommended EMST (Expiratory Muscle Strength Training) which has been found to strengthen muscles of the upper respiratory tract which involves all the structures involved in swallowing (Muscles that move the vocal folds, muscles of the pharynx, larynx and soft palate). Pt's daughter  ordered the EMST device and SLP will train Pt and family and provide Exercise Plan during next session for this device at that time. SLP provided written instructions of the above exercises to be completed daily + written recommendations for puree trials and free water X2/daily (not immediately after exercises). Pt will benefit from ongoing dysphagia therapy 1-2X/week for 8 weeks to ensure carryover of strategies recommended with PO at recent MBSS and to complete the above HEP to strengthen the swallowing structures. Pt is highly motivated and would like to have his tube "eventually removed", however SLP reviewed with Pt and daughters that Pt's swallowing impairment is very severe and that this will be a very  gradual process and that there is no guarantee that Pt will be able to consume enough to nutritionally sustain without the PEG. Pt and family are in agreement with POC    Speech Therapy Frequency 1x /week    Duration 8 weeks    Treatment/Interventions Aspiration precaution training;Pharyngeal strengthening exercises;Compensatory techniques;Diet toleration management by SLP;Trials of upgraded texture/liquids    Potential Considerations Ability to learn/carryover information;Severity of impairments    SLP Home Exercise Plan Provided    Consulted and Agree with Plan of Care Patient;Family member/caregiver    Family Member Consulted Daughters             Patient will benefit from skilled therapeutic intervention in order to improve the following deficits and impairments:   Dysphagia, oropharyngeal phase    Problem List Patient Active Problem List   Diagnosis Date Noted   Neurocognitive deficits 04/28/2020   History of anesthesia complications 46/28/6381   Recent weight loss 04/14/2020   Normochromic normocytic anemia 04/14/2020   Dysphagia, oropharyngeal 04/14/2020   Chronic cough 04/13/2020   Anorexia 04/07/2020   Failure to thrive in adult 04/07/2020   Protein-calorie malnutrition,  severe ([place]) 03/16/2020   Chronic constipation 03/15/2020   Acute blood loss anemia 03/15/2020   Thrombocytopenia ([place]) 03/15/2020   Closed left femoral fracture ([place]) 03/07/2020   Accidental fall 03/07/2020   CKD (chronic kidney disease), stage III ([place]) 03/07/2020   Spinal stenosis, lumbar region, with neurogenic claudication 10/19/2015   Closed right hip fracture ([place]) 12/05/2014   Closed hip fracture ([place]) 12/05/2014   BPH (benign prostatic hyperplasia) 12/05/2014   Hyperlipidemia 12/05/2014   Essential hypertension, benign 06/01/2010    H. Roddie Mc, CCC-SLP Speech Language Pathologist  Wende Bushy 09/20/2020, 10:05 AM  [facility] [place], [place], [address] Phone: [phone]   Fax:  [phone]  Name: Dominic Hodges MRN: [id number] Date of Birth: [birth year]

## 2020-09-22 ENCOUNTER — Ambulatory Visit (INDEPENDENT_AMBULATORY_CARE_PROVIDER_SITE_OTHER): Payer: Medicare Other | Admitting: Internal Medicine

## 2020-09-22 DIAGNOSIS — D0439 Carcinoma in situ of skin of other parts of face: Secondary | ICD-10-CM | POA: Diagnosis not present

## 2020-09-22 DIAGNOSIS — C44622 Squamous cell carcinoma of skin of right upper limb, including shoulder: Secondary | ICD-10-CM | POA: Diagnosis not present

## 2020-09-28 ENCOUNTER — Ambulatory Visit (HOSPITAL_COMMUNITY): Payer: Medicare Other | Admitting: Speech Pathology

## 2020-09-28 ENCOUNTER — Other Ambulatory Visit: Payer: Self-pay

## 2020-09-28 DIAGNOSIS — R1312 Dysphagia, oropharyngeal phase: Secondary | ICD-10-CM | POA: Diagnosis not present

## 2020-09-29 ENCOUNTER — Encounter (HOSPITAL_COMMUNITY): Payer: Self-pay | Admitting: Speech Pathology

## 2020-09-29 NOTE — Therapy (Signed)
[facility] [address] [place], [place], [address] Phone: [phone]   Fax:  [phone]  Speech Language Pathology Treatment  Patient Details  Name: Dominic Hodges MRN: [id number] Date of Birth: 22-Nov-1927 No data recorded  Encounter Date: 09/28/2020   End of Session - 09/29/20 1027     Visit Number 2    Number of Visits 16    Authorization Type No auth reqd; No OOP Max, Plan will follow [facility] guidelines, Plan will cover $233.00 mcade ded, plan will cover 20% of the mcare approve amt    SLP Start Time 1519    SLP Stop Time  1606    SLP Time Calculation (min) 47 min    Activity Tolerance Patient tolerated treatment well             Past Medical History:  Diagnosis Date   Acid reflux    mild-states if doesnt chew food completely he begins to cough- states has weakness in muscles due to ilieus- has been doing exreci this   Adynamic ileus ([place]) 12/2014   ? anesthesia related per pt   BPH (benign prostatic hyperplasia)    Complication of anesthesia    10/11/15- per pt "had [place]- in 11/16-related it to possibly anesthesia"   H/O seasonal allergies    Hyperlipidemia    Hypertension     Past Surgical History:  Procedure Laterality Date   APPENDECTOMY     approx 30 years ago/lwb   CATARACT EXTRACTION W/PHACO Left 09/11/2012   Procedure: CATARACT EXTRACTION PHACO AND INTRAOCULAR LENS PLACEMENT ([place]);  Surgeon: Tonny Branch, MD;  Location: [facility];  Service: Ophthalmology;  Laterality: Left;  CDE: 19.52   ESOPHAGOGASTRODUODENOSCOPY (EGD) WITH PROPOFOL N/A 05/03/2020   Procedure: ESOPHAGOGASTRODUODENOSCOPY (EGD) WITH PROPOFOL;  Surgeon: Aviva Signs, MD;  Location: [facility];  Service: General;  Laterality: N/A;   HIP ARTHROPLASTY Right 12/06/2014   Procedure: PARTIAL RIGHT HIP REPLACEMENT;  Surgeon: Carole Civil, MD;  Location: [facility];  Service: Orthopedics;  Laterality: Right;   HIP ARTHROPLASTY  Left 03/08/2020   Procedure: ARTHROPLASTY BIPOLAR HIP (HEMIARTHROPLASTY);  Surgeon: Mordecai Rasmussen, MD;  Location: [facility];  Service: Orthopedics;  Laterality: Left;   LUMBAR LAMINECTOMY/DECOMPRESSION MICRODISCECTOMY N/A 10/19/2015   Procedure: CENTRAL DECOMPRESSION LUMBAR LAMINECTOMY FOR SPINAL STENOSIS, L3,L4FORAMINOTOMY BILATERAL L3,L4 NERVE ROOT;  Surgeon: Latanya Maudlin, MD;  Location: [facility];  Service: Orthopedics;  Laterality: N/A;   PEG PLACEMENT N/A 05/03/2020   Procedure: PERCUTANEOUS ENDOSCOPIC GASTROSTOMY (PEG) PLACEMENT;  Surgeon: Aviva Signs, MD;  Location: [facility];  Service: General;  Laterality: N/A;   UMBILICAL HERNIA REPAIR     Approx 10 years ago/lwb   ureteral stone extraction      There were no vitals filed for this visit.          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 Per Pt report, he has had dysphagia and cough since intubation for a laminectomy in 2017. After his admission to [facility] (2022), Pt experienced significant weight loss over ~ 1 month and complained of dysphagia; MBSS was completed on 04/27/20  revealing severe dysphagia with recommendation for alternative means of nutrition; PEG tube was placed on 05/03/20. With little ST f/u following the first MBS, Pt was d/c home and was eventually referred for another MBS which was completed on 07/15/20 because Pt was requesting to have his "tube removed" MBS on 07/15/20 with recommendations for diet of "puree/minced textures and no liquids" and initiation of dysphagia therapy. Pt had some HH ST but was d/c after a few sessions. OP ST was initiated to provide dysphagia therapy to provide education and  implement strategies and strengthening exercises including EMST.      Treatment Provided   Treatment provided Dysphagia      Dysphagia Treatment   Temperature Spikes Noted No    Respiratory Status Room air    Oral Cavity - Dentition Adequate natural dentition    Treatment Methods Skilled observation;Therapeutic exercise;Compensation strategy training;Patient/caregiver education    Patient observed directly with PO's Yes    Type of PO's observed Dysphagia 1 (puree)    Feeding Able to feed self;Needs assist    Liquids provided via --   no liquids administered today   Oral Phase Signs & Symptoms Prolonged bolus formation    Pharyngeal Phase Signs & Symptoms Multiple swallows;Wet vocal quality;Delayed throat clear;Delayed cough;Watery eyes;Suspected delayed swallow initiation    Type of cueing Verbal;Visual    Amount of cueing Moderate      Pain Assessment   Pain Assessment No/denies pain      Assessment / Recommendations / Plan   Plan Continue with current plan of care;Goals updated      Dysphagia Recommendations   Diet recommendations Dysphagia 1 (puree)    Liquids provided via --   no liquids at this time   Medication Administration Via alternative means    Supervision Patient able to self feed;Trained caregiver to feed patient;Full supervision/cueing for compensatory strategies    Compensations Slow rate;Small sips/bites;Hard cough after swallow;Multiple dry swallows after each bite/sip    Postural Changes and/or Swallow Maneuvers Seated upright 90 degrees;Upright 30-60 min after meal      General Recommendations   Oral Care Recommendations Oral care before and after PO;Oral care BID;Staff/trained caregiver to provide oral care      Progression Toward Goals   Progression toward goals Progressing toward goals                SLP Short Term Goals - 09/28/20 1042       SLP SHORT TERM GOAL #1   Title Pt will complete home exercise Plan including Modified Shaker, Mosako  and effortful swallow 2X/daily per caregiver report    Baseline reports completion 09/28/20    Time 8    Period Weeks    Status On-going      SLP SHORT TERM GOAL #2   Title Will complete EMST evaluation (family plans to order prior to next visit) and determine approprate goals for daily completion of EMST    Baseline n/a    Time 8    Period Weeks    Status On-going      SLP SHORT TERM GOAL #3   Title Caregivers will report PO consumption 2X daily plus free water 2X daily - will be reported on chart provided by SLP    Baseline reports PO is very minimal ( a few bites one time per day and free water not every day ); reports completion 09/28/20    Time 8    Period Weeks    Status On-going  SLP SHORT TERM GOAL #4   Title Pt will consume puree textures during ST session with noted usage of strategies with minimal cues and support from SLP    Baseline mod cues reqd 9/21    Time 8    Period Weeks    Status On-going      SLP SHORT TERM GOAL #5   Title Pt will complete EMST with 15-20 reps 3 X daily for 4 weeks    Time 4    Period Weeks    Status New              SLP Long Term Goals - 09/28/20 1045       SLP LONG TERM GOAL #1   Title Pt will consume puree textures and free water with optimum safety and efficiency without overt s/sx of aspiration and with consistent usage of recommended strategies in order to improve Pt's overall QOL    Time 8    Period Weeks    Status New              Plan - 09/28/20 1027     Clinical Impression Statement 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 SLP initially provided trials of puree; each bite required 5-10 dry swallows following the initial bite to clear wet vocal quality and globus sensation reported by the Pt. SLP provided cues and support for additional swallows, throat clears and cued coughing to clear all wet vocal quality.  Daughter and caregiver report compliance and consistent completion of HEP provided in first session/evaluation including Shaker, Mosako and effortful swallow. Further, consistent PO trials twice daily were recommended and reportedly Pt/caregivers have complied with this recommendation. Majority of session today was spent with initiation/evaluation of use of EMST 150- Pt required mild/mod verbal cues and support to appropriately complete exercises. Device was placed on 30 cm H2O water pressure and Pt was only able to complete 5 consecutive puffs requiring 20-30 second breathing breaks in between while learning. Pt reports difficulty of 5 on a scale of 1-10 which is appropriate and preferred. Pt was most effective with verbal and visual cues and the clear mouth piece and no nose clip. Further SLP provided in writing that EMST should be completed 15-20 reps 3x daily; this was provided in writing. For upcoming session, requested Pt have thorough oral care prior to session or come bringing toothbrush and mouth wash for thorough oral care to be completed prior to trials and education for free water protocol in upcoming session(family/caregivers have not felt comfortable completing this at home despite recommendations for completion following both MBSS).    Speech Therapy Frequency 1x /week    Duration 8 weeks    Treatment/Interventions Aspiration precaution training;Pharyngeal strengthening exercises;Compensatory techniques;Diet toleration management by SLP;Trials of upgraded texture/liquids    Potential Considerations Ability to learn/carryover information;Severity of impairments    SLP Home Exercise Plan Provided    Consulted and Agree with Plan of Care Patient;Family member/caregiver    Family Member Consulted Daughter, [name]             Patient will benefit from skilled therapeutic intervention in order to improve the following deficits and impairments:   Dysphagia, oropharyngeal phase    Problem  List Patient Active Problem List   Diagnosis Date Noted   Neurocognitive deficits 04/28/2020   History of anesthesia complications 19/41/7408   Recent weight loss 04/14/2020   Normochromic normocytic anemia  04/14/2020   Dysphagia, oropharyngeal 04/14/2020   Chronic cough 04/13/2020   Anorexia 04/07/2020   Failure to thrive in adult 04/07/2020   Protein-calorie malnutrition, severe ([place]) 03/16/2020   Chronic constipation 03/15/2020   Acute blood loss anemia 03/15/2020   Thrombocytopenia ([place]) 03/15/2020   Closed left femoral fracture ([place]) 03/07/2020   Accidental fall 03/07/2020   CKD (chronic kidney disease), stage III ([place]) 03/07/2020   Spinal stenosis, lumbar region, with neurogenic claudication 10/19/2015   Closed right hip fracture ([place]) 12/05/2014   Closed hip fracture ([place]) 12/05/2014   BPH (benign prostatic hyperplasia) 12/05/2014   Hyperlipidemia 12/05/2014   Essential hypertension, benign 06/01/2010   Lyndsy Gilberto H. Roddie Mc, CCC-SLP Speech Language Pathologist  Wende Bushy 09/29/2020, 10:47 AM  [facility] [address] [place], [place], [address] Phone: [phone]   Fax:  [phone]   Name: Victormanuel Mclure MRN: [id number] Date of Birth: 05-05-1927

## 2020-10-03 DIAGNOSIS — N400 Enlarged prostate without lower urinary tract symptoms: Secondary | ICD-10-CM | POA: Diagnosis not present

## 2020-10-03 DIAGNOSIS — Z0001 Encounter for general adult medical examination with abnormal findings: Secondary | ICD-10-CM | POA: Diagnosis not present

## 2020-10-03 DIAGNOSIS — E538 Deficiency of other specified B group vitamins: Secondary | ICD-10-CM | POA: Diagnosis not present

## 2020-10-03 DIAGNOSIS — R053 Chronic cough: Secondary | ICD-10-CM | POA: Diagnosis not present

## 2020-10-03 DIAGNOSIS — Z9189 Other specified personal risk factors, not elsewhere classified: Secondary | ICD-10-CM | POA: Diagnosis not present

## 2020-10-03 DIAGNOSIS — I10 Essential (primary) hypertension: Secondary | ICD-10-CM | POA: Diagnosis not present

## 2020-10-03 DIAGNOSIS — E43 Unspecified severe protein-calorie malnutrition: Secondary | ICD-10-CM | POA: Diagnosis not present

## 2020-10-03 DIAGNOSIS — E782 Mixed hyperlipidemia: Secondary | ICD-10-CM | POA: Diagnosis not present

## 2020-10-03 DIAGNOSIS — E559 Vitamin D deficiency, unspecified: Secondary | ICD-10-CM | POA: Diagnosis not present

## 2020-10-03 DIAGNOSIS — R1310 Dysphagia, unspecified: Secondary | ICD-10-CM | POA: Diagnosis not present

## 2020-10-03 DIAGNOSIS — Z23 Encounter for immunization: Secondary | ICD-10-CM | POA: Diagnosis not present

## 2020-10-05 ENCOUNTER — Encounter (HOSPITAL_COMMUNITY): Payer: Self-pay | Admitting: Speech Pathology

## 2020-10-05 ENCOUNTER — Ambulatory Visit (HOSPITAL_COMMUNITY): Payer: Medicare Other | Admitting: Speech Pathology

## 2020-10-05 ENCOUNTER — Other Ambulatory Visit: Payer: Self-pay

## 2020-10-05 DIAGNOSIS — R1312 Dysphagia, oropharyngeal phase: Secondary | ICD-10-CM

## 2020-10-05 NOTE — Therapy (Signed)
[facility] [address] [place], [place], [address] Phone: [phone]   Fax:  [phone]  Speech Language Pathology Treatment  Patient Details  Name: Dominic Hodges MRN: [id number] Date of Birth: 1927-01-21 No data recorded  Encounter Date: 10/05/2020   End of Session - 10/05/20 1708     Visit Number 3    Number of Visits 16    Authorization Type No auth reqd; No OOP Max, Plan will follow MC guidelines, Plan will cover $233.00 mcade ded, plan will cover 20% of the mcare approve amt    SLP Start Time 8850    SLP Stop Time  1649    SLP Time Calculation (min) 43 min    Activity Tolerance Patient tolerated treatment well             Past Medical History:  Diagnosis Date   Acid reflux    mild-states if doesnt chew food completely he begins to cough- states has weakness in muscles due to ilieus- has been doing exreci this   Adynamic ileus ([place]) 12/2014   ? anesthesia related per pt   BPH (benign prostatic hyperplasia)    Complication of anesthesia    10/11/15- per pt "had [place]- in 11/16-related it to possibly anesthesia"   H/O seasonal allergies    Hyperlipidemia    Hypertension     Past Surgical History:  Procedure Laterality Date   APPENDECTOMY     approx 30 years ago/lwb   CATARACT EXTRACTION W/PHACO Left 09/11/2012   Procedure: CATARACT EXTRACTION PHACO AND INTRAOCULAR LENS PLACEMENT ([place]);  Surgeon: Tonny Branch, MD;  Location: [facility];  Service: Ophthalmology;  Laterality: Left;  CDE: 19.52   ESOPHAGOGASTRODUODENOSCOPY (EGD) WITH PROPOFOL N/A 05/03/2020   Procedure: ESOPHAGOGASTRODUODENOSCOPY (EGD) WITH PROPOFOL;  Surgeon: Aviva Signs, MD;  Location: [facility];  Service: General;  Laterality: N/A;   HIP ARTHROPLASTY Right 12/06/2014   Procedure: PARTIAL RIGHT HIP REPLACEMENT;  Surgeon: Carole Civil, MD;  Location: [facility];  Service: Orthopedics;  Laterality: Right;   HIP ARTHROPLASTY  Left 03/08/2020   Procedure: ARTHROPLASTY BIPOLAR HIP (HEMIARTHROPLASTY);  Surgeon: Mordecai Rasmussen, MD;  Location: [facility];  Service: Orthopedics;  Laterality: Left;   LUMBAR LAMINECTOMY/DECOMPRESSION MICRODISCECTOMY N/A 10/19/2015   Procedure: CENTRAL DECOMPRESSION LUMBAR LAMINECTOMY FOR SPINAL STENOSIS, L3,L4FORAMINOTOMY BILATERAL L3,L4 NERVE ROOT;  Surgeon: Latanya Maudlin, MD;  Location: [facility];  Service: Orthopedics;  Laterality: N/A;   PEG PLACEMENT N/A 05/03/2020   Procedure: PERCUTANEOUS ENDOSCOPIC GASTROSTOMY (PEG) PLACEMENT;  Surgeon: Aviva Signs, MD;  Location: [facility];  Service: General;  Laterality: N/A;   UMBILICAL HERNIA REPAIR     Approx 10 years ago/lwb   ureteral stone extraction      There were no vitals filed for this visit.          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 Per Pt report, he has had dysphagia and cough since intubation for a laminectomy in 2017. After his admission to [facility] (2022), Pt experienced significant weight loss over ~ 1 month and complained of dysphagia; MBSS was completed on 04/27/20  revealing severe dysphagia with recommendation for alternative means of nutrition; PEG tube was placed on 05/03/20. With little ST f/u following the first MBS, Pt was d/c home and was eventually referred for another MBS which was completed on 07/15/20 because Pt was requesting to have his "tube removed" MBS on 07/15/20 with recommendations for diet of "puree/minced textures and no liquids" and initiation of dysphagia therapy. Pt had some HH ST but was d/c after a few sessions. OP ST was initiated to provide dysphagia therapy to provide education and  implement strategies and strengthening exercises including EMST.      Treatment Provided   Treatment provided Dysphagia      Dysphagia Treatment   Temperature Spikes Noted No    Respiratory Status Room air    Oral Cavity - Dentition Adequate natural dentition    Treatment Methods Skilled observation;Therapeutic exercise;Compensation strategy training;Patient/caregiver education    Patient observed directly with PO's Yes    Type of PO's observed Thin liquids    Feeding Able to feed self    Liquids provided via Cup    Pharyngeal Phase Signs & Symptoms Multiple swallows;Wet vocal quality;Delayed throat clear;Delayed cough;Watery eyes    Type of cueing Verbal;Visual    Amount of cueing Moderate      Pain Assessment   Pain Assessment No/denies pain      Assessment / Recommendations / Plan   Plan Continue with current plan of care      Dysphagia Recommendations   Diet recommendations Dysphagia 1 (puree)    Liquids provided via Cup    Medication Administration Via alternative means    Supervision Patient able to self feed;Trained caregiver to feed patient;Full supervision/cueing for compensatory strategies    Compensations Slow rate;Small sips/bites;Hard cough after swallow;Multiple dry swallows after each bite/sip    Postural Changes and/or Swallow Maneuvers Seated upright 90 degrees;Upright 30-60 min after meal      General Recommendations   Oral Care Recommendations Oral care before and after PO;Oral care BID;Staff/trained caregiver to provide oral care      Progression Toward Goals   Progression toward goals Progressing toward goals              SLP Education - 10/05/20 1708     Education Details Provided handout regarding free water protocol    Person(s) Educated Patient;Caregiver(s);Child(ren)    Methods Explanation;Demonstration    Comprehension Verbalized understanding;Returned demonstration;Need further instruction              SLP Short Term Goals -  10/05/20 1714       SLP SHORT TERM GOAL #1   Title Pt will complete home exercise Plan including Modified Shaker, Mosako and effortful swallow 2X/daily per caregiver report    Baseline reports completion 09/28/20    Time 8    Period Weeks    Status On-going      SLP SHORT TERM GOAL #2   Title Will complete EMST evaluation (family plans to order prior to next visit) and determine approprate goals for daily completion of EMST    Baseline n/a    Time 8    Period Weeks    Status On-going      SLP SHORT TERM GOAL #3   Title Caregivers will report PO consumption 2X daily plus free water 2X daily - will be reported on chart provided by SLP    Baseline reports PO is very minimal ( a few bites one time per day and free water not every day );  reports completion 09/28/20    Time 8    Period Weeks    Status On-going      SLP SHORT TERM GOAL #4   Title Pt will consume puree textures during ST session with noted usage of strategies with minimal cues and support from SLP    Baseline mod cues reqd 9/21    Time 8    Period Weeks    Status On-going      SLP SHORT TERM GOAL #5   Title Pt will complete EMST with 15-20 reps 3 X daily for 4 weeks    Time 4    Period Weeks    Status New              SLP Long Term Goals - 10/05/20 1714       SLP LONG TERM GOAL #1   Title Pt will consume puree textures and free water with optimum safety and efficiency without overt s/sx of aspiration and with consistent usage of recommended strategies in order to improve Pt's overall QOL    Time 8    Period Weeks    Status New              Plan - 10/05/20 1710     Clinical Impression Statement SLP provided ongoing diagnostic dysphagia therapy today targeting free water protocol extended education and trials followed by EMST completion and education. Caregiver and daughter report thorough oral care provided prior to coming to session; SLP reviewed handout on Mare Ferrari free water, the benefits,  limitations, guidelines and research. Pt consumed ~120 cc's of water with single cup sips of water with noted wet vocal quality and occasional delayed coughing. SLP provided education and support for caregivers and Pt during trials of free water and request adding free water to daily routine following oral care. Pt then completed EMST requiring mod verbal cues and support to correctly complete 20 reps.    Speech Therapy Frequency 1x /week    Duration 8 weeks    Treatment/Interventions Aspiration precaution training;Pharyngeal strengthening exercises;Compensatory techniques;Diet toleration management by SLP;Trials of upgraded texture/liquids    Potential Considerations Ability to learn/carryover information;Severity of impairments    SLP Home Exercise Plan Provided    Consulted and Agree with Plan of Care Patient;Family member/caregiver    Family Member Consulted Daughter, [name]             Patient will benefit from skilled therapeutic intervention in order to improve the following deficits and impairments:   Dysphagia, oropharyngeal phase    Problem List Patient Active Problem List   Diagnosis Date Noted   Neurocognitive deficits 04/28/2020   History of anesthesia complications 64/40/3474   Recent weight loss 04/14/2020   Normochromic normocytic anemia 04/14/2020   Dysphagia, oropharyngeal 04/14/2020   Chronic cough 04/13/2020   Anorexia 04/07/2020   Failure to thrive in adult 04/07/2020   Protein-calorie malnutrition, severe ([place]) 03/16/2020   Chronic constipation 03/15/2020   Acute blood loss anemia 03/15/2020   Thrombocytopenia ([place]) 03/15/2020   Closed left femoral fracture ([place]) 03/07/2020   Accidental fall 03/07/2020   CKD (chronic kidney disease), stage III ([place]) 03/07/2020   Spinal stenosis, lumbar region, with neurogenic claudication 10/19/2015   Closed right hip fracture ([place]) 12/05/2014   Closed hip fracture ([place]) 12/05/2014   BPH (benign prostatic hyperplasia)  12/05/2014   Hyperlipidemia 12/05/2014   Essential hypertension, benign 06/01/2010   Kathleen Likins H. Roddie Mc, CCC-SLP Speech Language Pathologist  Wende Bushy 10/05/2020, 5:15 PM  [facility] [place], [place], [address] Phone: [phone]   Fax:  [phone]   Name: Warnie Belair MRN: [id number] Date of Birth: 1927-02-19

## 2020-10-06 ENCOUNTER — Encounter (INDEPENDENT_AMBULATORY_CARE_PROVIDER_SITE_OTHER): Payer: Self-pay | Admitting: Internal Medicine

## 2020-10-06 ENCOUNTER — Ambulatory Visit (INDEPENDENT_AMBULATORY_CARE_PROVIDER_SITE_OTHER): Payer: Medicare Other | Admitting: Internal Medicine

## 2020-10-06 VITALS — BP 122/71 | HR 73 | Temp 98.5°F | Ht 77.0 in | Wt 166.0 lb

## 2020-10-06 DIAGNOSIS — R1312 Dysphagia, oropharyngeal phase: Secondary | ICD-10-CM | POA: Diagnosis not present

## 2020-10-06 DIAGNOSIS — Z931 Gastrostomy status: Secondary | ICD-10-CM | POA: Diagnosis not present

## 2020-10-06 NOTE — Progress Notes (Signed)
Presenting complaint;  Follow-up for oropharyngeal dysphagia. Patient has gastrostomy tube in place.  Database and subjective:  Dominic Hodges is a 87 year old Caucasian male who is here for scheduled visit.  I last saw him on 07/21/2020.  He was interested in having gastrostomy tube removed but I felt that he would not be able to sustain without gastric feeding.  He was evaluated by Dominic Hodges of neurology service to pinpoint the source of his dysphagia.  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  He recommended baby aspirin. He is seeing Dominic Hodges of speech pathology regularly.  Last visit was yesterday.  She recommended increasing intake of water by mouth.  Patient is afraid because he complains of bloating.  He is not having any regurgitation or heartburn. He continues to complain of having poor appetite but he is not having any nausea.  He has gained 18 pounds since I last saw him about 10 weeks ago.  He says he feels stronger he is walking more than he has in the past. Dominic Hodges wants to have gastrostomy tube change.  It has clogged couple times but Dominic Hodges who is accompanying him today states that she has never had any problems with his tube.  They have been using Coca-Cola to flush it. Patient said he had 3 skin lesions removed by Dr. [place] Hodges.  1 from right side of his face and 2 from right forearm.  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  Current Medications: Outpatient Encounter Medications as of 10/06/2020  Medication Sig   aspirin EC 81 MG tablet Take 81 mg by mouth daily. Swallow whole.   benzonatate (TESSALON) 100 MG capsule Take 1 capsule (100 mg total) by mouth every 8 (eight) hours.   Cholecalciferol (VITAMIN D) 125 MCG (5000 UT) CAPS 5,000 Units by PEG Tube route daily in the afternoon. 0900   cloNIDine (CATAPRES - DOSED IN MG/24 HR) 0.1 mg/24hr patch Place 1 patch (0.1 mg total) onto the skin once a week. Place on Fridays    famotidine (PEPCID) 40 MG/5ML suspension Take 2.5 mLs (20 mg total) by mouth at bedtime.   fluorometholone (FML) 0.1 % ophthalmic suspension Place 1 drop into both eyes 2 (two) times daily. 0900 & 2100   guaiFENesin (MUCINEX PO) Take by mouth. Mucinex two times a day   Multiple Vitamin (MULTIVITAMIN) tablet Take 1 tablet by mouth daily.   Probiotic Product (PROBIOTIC DAILY PO) Take by mouth. One daily   acetaminophen (TYLENOL) 160 MG/5ML elixir Place 325 mg into feeding tube every 6 (six) hours as needed for fever. Give 650 mg (Patient not taking: Reported on 10/06/2020)   azithromycin (ZITHROMAX) 250 MG tablet Take 1 tablet (250 mg total) by mouth daily. (Patient not taking: Reported on 10/06/2020)   polyethylene glycol (MIRALAX / GLYCOLAX) 17 g packet Place 17 g into feeding tube daily as needed. (Patient not taking: Reported on 10/06/2020)   rosuvastatin (CRESTOR) 20 MG tablet Place 1 tablet (20 mg total) into feeding tube at bedtime. 2100 (Patient not taking: Reported on 10/06/2020)   No facility-administered encounter medications on file as of 10/06/2020.     Objective: Blood pressure 122/71, pulse 73, temperature 98.5 F (36.9 C), temperature source Oral, height _0  (1.956 m), weight 166 lb (75.3 kg). Patient is alert and appears to be comfortable sitting in wheelchair. He has a Band-Aid over right side of face.  Skin lesion removed. Conjunctiva is pink. Sclera is nonicteric Oropharyngeal  mucosa is normal. He has normal pharyngeal reflux. No neck masses or thyromegaly noted. Abdomen.  Gastrostomy tube is in place.  Stoma is dry.  Tube does not show any signs of wear-and-tear such as caliber change or ballooning. No LE edema or clubbing noted.  Labs/studies Results:   CBC Latest Ref Rng & Units 07/21/2020 04/21/2020 04/15/2020  WBC 3.8 - 10.8 Thousand/uL 8.2 8.5 10.0  Hemoglobin 13.2 - 17.1 g/dL 13.3 11.9(L) 13.0  Hematocrit 38.5 - 50.0 % 39.6 38.5(L) 41.2  Platelets 140 - 400  Thousand/uL 205 243 246    CMP Latest Ref Rng & Units 09/19/2020 08/10/2020 07/21/2020  Glucose 65 - 99 mg/dL 122(H) 141(H) 67  BUN 10 - 36 mg/dL 40(H) 49(H) 48(H)  Creatinine 0.76 - 1.27 mg/dL 1.11 1.10 1.12  Sodium 134 - 144 mmol/L 137 138 139  Potassium 3.5 - 5.2 mmol/L 5.1 4.5 4.6  Chloride 96 - 106 mmol/L 97 103 102  CO2 20 - 29 mmol/L _0 Calcium 8.6 - 10.2 mg/dL 8.9 9.0 9.2  Total Protein 6.1 - 8.1 g/dL - - 6.9  Total Bilirubin 0.2 - 1.2 mg/dL - - 0.5  Alkaline Phos 38 - 126 U/L - - -  AST 10 - 35 U/L - - 18  ALT 9 - 46 U/L - - 15    Hepatic Function Latest Ref Rng & Units 07/21/2020 04/21/2020 04/15/2020  Total Protein 6.1 - 8.1 g/dL 6.9 6.3(L) 6.7  Albumin 3.5 - 5.0 g/dL - 3.4(L) 3.6  AST 10 - 35 U/L _1 ALT 9 - 46 U/L _2 Alk Phosphatase 38 - 126 U/L - 116 115  Total Bilirubin 0.2 - 1.2 mg/dL 0.5 0.8 1.0    Lab data from 10/03/2020 B12 847 Vitamin D 35.2 PSA 3.9 Cholesterol 156 HDL 44 and LDL 94 Triglycerides 96  WBC 8.3, H&H 13.6 and 39.3 and platelet count 213K.    Assessment:  #1.  Oropharyngeal dysphagia.  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  He has been evaluated by Dominic Hodges of neurology and underwent MR of brain about 8 weeks ago. He is undergoing periodic evaluation by Dominic Hodges, SLLP. I agree with the recommendations and have asked patient to start drinking water as she recommended and also try to eat more soft foods daily. He remains to be seen if his intake would be sufficient that gastric feeding could be discontinued.  #2.  Gastrostomy tube issues.  Gastrostomy tube reportedly has been occluded intermittently.  According to [facility] her caregiver she is not having any issues.  They should continue to flush it with Coca-Cola daily or or couple times a week.  Would also recommend using the brush and passage through the gastrostomy tube daily for 1 week and thereafter twice a week or as needed. Dominic Hodges was  instructed on how to use brush.  She will straighten the gastrostomy tube measured the length and add 2 cm to the length of brush that she should be pushing through the tube.  #3.  Weight loss.  Very reassuring to note that Dominic Hodges has gained 18 pounds since his last visit.  #4.  Elevated BUN to creatinine ratio.  Ratio has improved since he has been getting more free water through the gastrostomy tube.  We will check metabolic 7 at the time of next visit in 4 weeks.   Plan:  Patient encouraged to drink 6 ounces of water 3  times a day.  He can always start at 3 to 4 ounces and gradually increase the volume as tolerated. If he is able to drink water by mouth, free water that he is getting via gastrostomy tube should be decreased by the same amount. He will also start eating soft foods between bolus feedings we will see how he does. With gastrostomy tube care discussed with patient's daughter Dominic Hodges and Dominic Hodges one of his caregivers. Patient will return in 4 weeks.

## 2020-10-06 NOTE — Patient Instructions (Addendum)
Can drink 6 ounces of water at least 3 times a day.  Can start with 3 to 4 ounces and increase as tolerated. Amount of water that you drink by mouth can be subtracted from water that is administered via gastrostomy tube. Clean gastrostomy tube with brush daily for 1 week and thereafter less often or at least once a week. Keep gastrostomy tube site dry at all times

## 2020-10-10 DIAGNOSIS — R350 Frequency of micturition: Secondary | ICD-10-CM | POA: Diagnosis not present

## 2020-10-12 ENCOUNTER — Other Ambulatory Visit: Payer: Self-pay

## 2020-10-12 ENCOUNTER — Ambulatory Visit (HOSPITAL_COMMUNITY): Payer: Medicare Other | Attending: Internal Medicine | Admitting: Speech Pathology

## 2020-10-12 ENCOUNTER — Encounter (HOSPITAL_COMMUNITY): Payer: Self-pay | Admitting: Speech Pathology

## 2020-10-12 DIAGNOSIS — R1312 Dysphagia, oropharyngeal phase: Secondary | ICD-10-CM | POA: Insufficient documentation

## 2020-10-12 NOTE — Therapy (Signed)
[facility] [address] [place], [place], [address] Phone: [phone]   Fax:  [phone]  Speech Language Pathology Treatment  Patient Details  Name: Dominic Hodges MRN: [id number] Date of Birth: 1927-03-31 No data recorded  Encounter Date: 10/12/2020   End of Session - 10/12/20 1736     Visit Number 4    Number of Visits 16    Authorization Type No auth reqd; No OOP Max, Plan will follow [facility] guidelines, Plan will cover $233.00 mcade ded, plan will cover 20% of the mcare approve amt    SLP Start Time 1600    SLP Stop Time  1655    SLP Time Calculation (min) 55 min    Activity Tolerance Patient tolerated treatment well             Past Medical History:  Diagnosis Date   Acid reflux    mild-states if doesnt chew food completely he begins to cough- states has weakness in muscles due to ilieus- has been doing exreci this   Adynamic ileus ([place]) 12/2014   ? anesthesia related per pt   BPH (benign prostatic hyperplasia)    Complication of anesthesia    10/11/15- per pt "had [facility]- in 11/16-related it to possibly anesthesia"   H/O seasonal allergies    Hyperlipidemia    Hypertension     Past Surgical History:  Procedure Laterality Date   APPENDECTOMY     approx 30 years ago/lwb   CATARACT EXTRACTION W/PHACO Left 09/11/2012   Procedure: CATARACT EXTRACTION PHACO AND INTRAOCULAR LENS PLACEMENT ([place]);  Surgeon: Tonny Branch, MD;  Location: [facility];  Service: Ophthalmology;  Laterality: Left;  CDE: 19.52   ESOPHAGOGASTRODUODENOSCOPY (EGD) WITH PROPOFOL N/A 05/03/2020   Procedure: ESOPHAGOGASTRODUODENOSCOPY (EGD) WITH PROPOFOL;  Surgeon: Aviva Signs, MD;  Location: [facility];  Service: General;  Laterality: N/A;   HIP ARTHROPLASTY Right 12/06/2014   Procedure: PARTIAL RIGHT HIP REPLACEMENT;  Surgeon: Carole Civil, MD;  Location: [facility];  Service: Orthopedics;  Laterality: Right;   HIP ARTHROPLASTY  Left 03/08/2020   Procedure: ARTHROPLASTY BIPOLAR HIP (HEMIARTHROPLASTY);  Surgeon: Mordecai Rasmussen, MD;  Location: [facility];  Service: Orthopedics;  Laterality: Left;   LUMBAR LAMINECTOMY/DECOMPRESSION MICRODISCECTOMY N/A 10/19/2015   Procedure: CENTRAL DECOMPRESSION LUMBAR LAMINECTOMY FOR SPINAL STENOSIS, L3,L4FORAMINOTOMY BILATERAL L3,L4 NERVE ROOT;  Surgeon: Latanya Maudlin, MD;  Location: [facility];  Service: Orthopedics;  Laterality: N/A;   PEG PLACEMENT N/A 05/03/2020   Procedure: PERCUTANEOUS ENDOSCOPIC GASTROSTOMY (PEG) PLACEMENT;  Surgeon: Aviva Signs, MD;  Location: [facility];  Service: General;  Laterality: N/A;   UMBILICAL HERNIA REPAIR     Approx 10 years ago/lwb   ureteral stone extraction      There were no vitals filed for this visit.          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 Per Pt report, he has had dysphagia and cough since intubation for a laminectomy in 2017. After his admission to [facility] (2022), Pt experienced significant weight loss over ~ 1 month and complained of dysphagia; MBSS was completed on 04/27/20  revealing severe dysphagia with recommendation for alternative means of nutrition; PEG tube was placed on 05/03/20. With little ST f/u following the first MBS, Pt was d/c home and was eventually referred for another MBS which was completed on 07/15/20 because Pt was requesting to have his "tube removed" MBS on 07/15/20 with recommendations for diet of "puree/minced textures and no liquids" and initiation of dysphagia therapy. Pt had some HH ST but was d/c after a few sessions. OP ST was initiated to provide dysphagia therapy to provide education and  implement strategies and strengthening exercises including EMST.      Treatment Provided   Treatment provided Dysphagia      Dysphagia Treatment   Temperature Spikes Noted No    Respiratory Status Room air    Oral Cavity - Dentition Adequate natural dentition    Treatment Methods Skilled observation;Therapeutic exercise;Compensation strategy training;Patient/caregiver education    Patient observed directly with PO's Yes    Type of PO's observed Regular    Feeding Able to feed self    Oral Phase Signs & Symptoms Prolonged mastication    Pharyngeal Phase Signs & Symptoms Wet vocal quality;Multiple swallows;Suspected delayed swallow initiation;Delayed cough;Delayed throat clear;Watery eyes    Type of cueing Verbal;Visual    Amount of cueing Moderate      Pain Assessment   Pain Assessment No/denies pain      Assessment / Recommendations / Plan   Plan Continue with current plan of care      Dysphagia Recommendations   Diet recommendations Dysphagia 1 (puree)    Liquids provided via Cup    Medication Administration Via alternative means    Supervision Patient able to self feed;Trained caregiver to feed patient;Full supervision/cueing for compensatory strategies    Compensations Slow rate;Small sips/bites;Hard cough after swallow;Multiple dry swallows after each bite/sip    Postural Changes and/or Swallow Maneuvers Seated upright 90 degrees;Upright 30-60 min after meal      General Recommendations   Oral Care Recommendations Oral care before and after PO;Oral care BID;Staff/trained caregiver to provide oral care      Progression Toward Goals   Progression toward goals Progressing toward goals                SLP Short Term Goals - 10/12/20 1758       SLP SHORT TERM GOAL #1   Title Pt will complete home exercise Plan including Modified [place], [place] and effortful swallow 2X/daily per caregiver report    Baseline reports completion 09/28/20    Time 8    Period Weeks     Status On-going      SLP SHORT TERM GOAL #2   Title Will complete EMST evaluation (family plans to order prior to next visit) and determine approprate goals for daily completion of EMST    Baseline n/a    Time 8    Period Weeks    Status On-going      SLP SHORT TERM GOAL #3   Title Caregivers will report PO consumption 2X daily plus free water 2X daily - will be reported on chart provided by SLP    Baseline reports PO is very minimal ( a few bites one time per day and free water not every day ); reports completion 09/28/20    Time 8    Period Weeks    Status On-going      SLP SHORT TERM GOAL #4   Title Pt will consume puree textures during ST session with noted usage  of strategies with minimal cues and support from SLP    Baseline mod cues reqd 9/21    Time 8    Period Weeks    Status On-going      SLP SHORT TERM GOAL #5   Title Pt will complete EMST with 15-20 reps 3 X daily for 4 weeks    Time 4    Period Weeks    Status New              SLP Long Term Goals - 10/12/20 1758       SLP LONG TERM GOAL #1   Title Pt will consume puree textures and free water with optimum safety and efficiency without overt s/sx of aspiration and with consistent usage of recommended strategies in order to improve Pt's overall QOL    Time 8    Period Weeks    Status New              Plan - 10/12/20 1737     Clinical Impression Statement SLP provided ongoing diagnostic dysphagia therapy today targeting initial discussion with caregiver and Pt's daughter, [name]. Reports Pt is only consuming 1-4 bites of puree/solid textures per day; further reports Pt consumed 2-3 sips of water with each attempt (with free water protocol recommended ~6 oz4 times per day). Pt reports frustration with all soft/puree textures; SLP provided a small bite of a graham cracker and Pt demonstrated prolonged mastication, multiple swallows and residual wet vocal qualty for ~ 10 minutes after one bite trial. After  one bite Pt reports: "I don't want anymore". SLP spent extended time today discussing progression and lack of progress/inability to follow recommendations at home as evidenced by Pt being unable to increase PO intake; further note overt s/sx of pharyngeal residue and oropharyngeal dysphagia during trials during therapy and caregiver reports increased coughing with trials and increased phlegm following all PO. SLP discussed with family that if improvement is not demonstrated over the next month that Pt will be discharged with a home exercise plan to be completed at his leisure, however, structured dysphagia therapy is not indicated if recommendations/exercises cannot consistently be completed in the home/daily. Pt then completed EMST successfully and reported a difficulty of 2-3, however when level was slightly increased in difficulty and Pt was unable to blow through device. Difficulty was returned to 20 cm H20.    Speech Therapy Frequency 1x /week    Duration 8 weeks    Treatment/Interventions Aspiration precaution training;Pharyngeal strengthening exercises;Compensatory techniques;Diet toleration management by SLP;Trials of upgraded texture/liquids    Potential Considerations Ability to learn/carryover information;Severity of impairments    SLP Home Exercise Plan Provided    Consulted and Agree with Plan of Care Patient;Family member/caregiver    Family Member Consulted Daughter, [name]             Patient will benefit from skilled therapeutic intervention in order to improve the following deficits and impairments:   Dysphagia, oropharyngeal phase    Problem List Patient Active Problem List   Diagnosis Date Noted   Gastrostomy tube in place [facility]) 10/06/2020   Neurocognitive deficits 04/28/2020   History of anesthesia complications 00/17/4944   Recent weight loss 04/14/2020   Normochromic normocytic anemia 04/14/2020   Dysphagia, oropharyngeal 04/14/2020   Chronic cough 04/13/2020    Anorexia 04/07/2020   Failure to thrive in adult 04/07/2020   Protein-calorie malnutrition, severe ([place]) 03/16/2020   Chronic constipation 03/15/2020   Acute blood loss anemia 03/15/2020   Thrombocytopenia (  [place]) 03/15/2020   Closed left femoral fracture ([place]) 03/07/2020   Accidental fall 03/07/2020   CKD (chronic kidney disease), stage III ([place]) 03/07/2020   Spinal stenosis, lumbar region, with neurogenic claudication 10/19/2015   Closed right hip fracture ([place]) 12/05/2014   Closed hip fracture ([place]) 12/05/2014   BPH (benign prostatic hyperplasia) 12/05/2014   Hyperlipidemia 12/05/2014   Essential hypertension, benign 06/01/2010   Heath Tesler H. Roddie Mc, CCC-SLP Speech Language Pathologist  Wende Bushy 10/12/2020, 5:59 PM  [facility] [address] [place], [place], [address] Phone: [phone]   Fax:  [phone]   Name: Dominic Hodges MRN: [id number] Date of Birth: [birth year]

## 2020-10-17 ENCOUNTER — Telehealth (INDEPENDENT_AMBULATORY_CARE_PROVIDER_SITE_OTHER): Payer: Self-pay | Admitting: *Deleted

## 2020-10-17 DIAGNOSIS — J069 Acute upper respiratory infection, unspecified: Secondary | ICD-10-CM | POA: Diagnosis not present

## 2020-10-17 NOTE — Telephone Encounter (Signed)
Pt left vm this morning that he had a rough night and wanted dr Laural Golden to call him. Dr Laural Golden not in office today. I called and left pt a message to call back to get more information. Pt did not call back so I called back again and pt states he is having sinus issues and he got in touch with dr hall his pcp and got an antibiotic and he does not need anything from dr Laural Golden at this time.

## 2020-10-19 ENCOUNTER — Ambulatory Visit (HOSPITAL_COMMUNITY): Payer: Medicare Other | Admitting: Speech Pathology

## 2020-10-20 ENCOUNTER — Encounter (INDEPENDENT_AMBULATORY_CARE_PROVIDER_SITE_OTHER): Payer: Self-pay

## 2020-10-26 ENCOUNTER — Ambulatory Visit (HOSPITAL_COMMUNITY): Payer: Medicare Other | Admitting: Speech Pathology

## 2020-10-27 ENCOUNTER — Other Ambulatory Visit (HOSPITAL_COMMUNITY): Payer: Self-pay | Admitting: Internal Medicine

## 2020-10-27 ENCOUNTER — Other Ambulatory Visit: Payer: Self-pay

## 2020-10-27 ENCOUNTER — Ambulatory Visit (HOSPITAL_COMMUNITY)
Admission: RE | Admit: 2020-10-27 | Discharge: 2020-10-27 | Disposition: A | Discharge status: Home / Self Care | Payer: Medicare Other | Source: Ambulatory Visit | Attending: Internal Medicine | Admitting: Internal Medicine

## 2020-10-27 DIAGNOSIS — R0602 Shortness of breath: Secondary | ICD-10-CM | POA: Diagnosis not present

## 2020-10-27 DIAGNOSIS — R059 Cough, unspecified: Secondary | ICD-10-CM

## 2020-11-17 ENCOUNTER — Ambulatory Visit (INDEPENDENT_AMBULATORY_CARE_PROVIDER_SITE_OTHER): Payer: Medicare Other | Admitting: Internal Medicine

## 2021-02-07 DIAGNOSIS — E782 Mixed hyperlipidemia: Secondary | ICD-10-CM | POA: Diagnosis not present

## 2021-02-07 DIAGNOSIS — I10 Essential (primary) hypertension: Secondary | ICD-10-CM | POA: Diagnosis not present

## 2021-02-08 ENCOUNTER — Telehealth: Payer: Self-pay | Admitting: Orthopedic Surgery

## 2021-02-08 NOTE — Telephone Encounter (Signed)
Patient's daughter/designated contact on file, [name], called to relay that patient's extended care insurer may be calling and/or faxing authorization for information; wanted to let us know.

## 2021-02-14 ENCOUNTER — Telehealth (INDEPENDENT_AMBULATORY_CARE_PROVIDER_SITE_OTHER): Payer: Self-pay | Admitting: *Deleted

## 2021-02-14 NOTE — Telephone Encounter (Signed)
Would like to know about water intake also.

## 2021-02-14 NOTE — Telephone Encounter (Signed)
Pt's daughter Manuela Schwartz cotter called to report he was vomited after each feed. Started 2 days ago. Yesterday she told care givers to hold on feeding and let him rest. He had a bi BM yesterday morning. He is using [employer] 2 cartons qid. Cartons are 8 oz. Per dr Laural Golden cut back for 48 hours to half. Then can go 3/4 and then full if doing well. Also check to see if tube has migrated in. Manuela Schwartz verbalized understanding of all and will call back if problems.

## 2021-02-14 NOTE — Telephone Encounter (Signed)
Ok to cut back if needed but no more than 15 percent per dr Laural Golden. Called and discussed with pt's daughter suzanne cotter.

## 2021-03-07 ENCOUNTER — Telehealth (INDEPENDENT_AMBULATORY_CARE_PROVIDER_SITE_OTHER): Payer: Self-pay | Admitting: *Deleted

## 2021-03-07 NOTE — Telephone Encounter (Signed)
Per Dr. Laural Golden patient needs to call pcp for antibiotic. I called and discussed with pt's wife and she verbalized understanding and states she will call pcp.

## 2021-03-07 NOTE — Telephone Encounter (Signed)
Wife Dominic Hodges called and states he has had a cough for about one and a half weeks. States congestion in throat not in chest, first said no sob but then said for about 2 days she has noticed he seems sob in the mornings when getting up. Cough is productive. No fever. Tried mucinex. Does not seem to help. Wonder if Dr.Rehman had any recommendation because it is hard to get him in the office.

## 2021-03-08 NOTE — Telephone Encounter (Signed)
error 

## 2021-03-09 DIAGNOSIS — D0472 Carcinoma in situ of skin of left lower limb, including hip: Secondary | ICD-10-CM | POA: Diagnosis not present

## 2021-03-09 DIAGNOSIS — X32XXXD Exposure to sunlight, subsequent encounter: Secondary | ICD-10-CM | POA: Diagnosis not present

## 2021-03-09 DIAGNOSIS — L570 Actinic keratosis: Secondary | ICD-10-CM | POA: Diagnosis not present

## 2021-04-04 DIAGNOSIS — R1310 Dysphagia, unspecified: Secondary | ICD-10-CM | POA: Diagnosis not present

## 2021-04-04 DIAGNOSIS — R600 Localized edema: Secondary | ICD-10-CM | POA: Diagnosis not present

## 2021-04-04 DIAGNOSIS — E782 Mixed hyperlipidemia: Secondary | ICD-10-CM | POA: Diagnosis not present

## 2021-04-04 DIAGNOSIS — E559 Vitamin D deficiency, unspecified: Secondary | ICD-10-CM | POA: Diagnosis not present

## 2021-04-04 DIAGNOSIS — E43 Unspecified severe protein-calorie malnutrition: Secondary | ICD-10-CM | POA: Diagnosis not present

## 2021-04-04 DIAGNOSIS — M6281 Muscle weakness (generalized): Secondary | ICD-10-CM | POA: Diagnosis not present

## 2021-04-04 DIAGNOSIS — I10 Essential (primary) hypertension: Secondary | ICD-10-CM | POA: Diagnosis not present

## 2021-04-04 DIAGNOSIS — Z9189 Other specified personal risk factors, not elsewhere classified: Secondary | ICD-10-CM | POA: Diagnosis not present

## 2021-04-04 DIAGNOSIS — F411 Generalized anxiety disorder: Secondary | ICD-10-CM | POA: Diagnosis not present

## 2021-04-04 DIAGNOSIS — E538 Deficiency of other specified B group vitamins: Secondary | ICD-10-CM | POA: Diagnosis not present

## 2021-04-04 DIAGNOSIS — R053 Chronic cough: Secondary | ICD-10-CM | POA: Diagnosis not present

## 2021-04-04 DIAGNOSIS — N400 Enlarged prostate without lower urinary tract symptoms: Secondary | ICD-10-CM | POA: Diagnosis not present

## 2021-04-10 DIAGNOSIS — F411 Generalized anxiety disorder: Secondary | ICD-10-CM | POA: Diagnosis not present

## 2021-04-10 DIAGNOSIS — E538 Deficiency of other specified B group vitamins: Secondary | ICD-10-CM | POA: Diagnosis not present

## 2021-04-10 DIAGNOSIS — I129 Hypertensive chronic kidney disease with stage 1 through stage 4 chronic kidney disease, or unspecified chronic kidney disease: Secondary | ICD-10-CM | POA: Diagnosis not present

## 2021-04-10 DIAGNOSIS — N1831 Chronic kidney disease, stage 3a: Secondary | ICD-10-CM | POA: Diagnosis not present

## 2021-04-10 DIAGNOSIS — N400 Enlarged prostate without lower urinary tract symptoms: Secondary | ICD-10-CM | POA: Diagnosis not present

## 2021-04-10 DIAGNOSIS — Z931 Gastrostomy status: Secondary | ICD-10-CM | POA: Diagnosis not present

## 2021-04-10 DIAGNOSIS — R269 Unspecified abnormalities of gait and mobility: Secondary | ICD-10-CM | POA: Diagnosis not present

## 2021-04-10 DIAGNOSIS — E782 Mixed hyperlipidemia: Secondary | ICD-10-CM | POA: Diagnosis not present

## 2021-04-10 DIAGNOSIS — E43 Unspecified severe protein-calorie malnutrition: Secondary | ICD-10-CM | POA: Diagnosis not present

## 2021-04-12 DIAGNOSIS — N1831 Chronic kidney disease, stage 3a: Secondary | ICD-10-CM | POA: Diagnosis not present

## 2021-04-12 DIAGNOSIS — R269 Unspecified abnormalities of gait and mobility: Secondary | ICD-10-CM | POA: Diagnosis not present

## 2021-04-12 DIAGNOSIS — E782 Mixed hyperlipidemia: Secondary | ICD-10-CM | POA: Diagnosis not present

## 2021-04-12 DIAGNOSIS — E538 Deficiency of other specified B group vitamins: Secondary | ICD-10-CM | POA: Diagnosis not present

## 2021-04-12 DIAGNOSIS — I129 Hypertensive chronic kidney disease with stage 1 through stage 4 chronic kidney disease, or unspecified chronic kidney disease: Secondary | ICD-10-CM | POA: Diagnosis not present

## 2021-04-12 DIAGNOSIS — E43 Unspecified severe protein-calorie malnutrition: Secondary | ICD-10-CM | POA: Diagnosis not present

## 2021-04-13 DIAGNOSIS — C44729 Squamous cell carcinoma of skin of left lower limb, including hip: Secondary | ICD-10-CM | POA: Diagnosis not present

## 2021-04-13 DIAGNOSIS — C4442 Squamous cell carcinoma of skin of scalp and neck: Secondary | ICD-10-CM | POA: Diagnosis not present

## 2021-04-18 DIAGNOSIS — R600 Localized edema: Secondary | ICD-10-CM | POA: Diagnosis not present

## 2021-04-18 DIAGNOSIS — Z741 Need for assistance with personal care: Secondary | ICD-10-CM | POA: Diagnosis not present

## 2021-04-19 DIAGNOSIS — I129 Hypertensive chronic kidney disease with stage 1 through stage 4 chronic kidney disease, or unspecified chronic kidney disease: Secondary | ICD-10-CM | POA: Diagnosis not present

## 2021-04-19 DIAGNOSIS — E538 Deficiency of other specified B group vitamins: Secondary | ICD-10-CM | POA: Diagnosis not present

## 2021-04-19 DIAGNOSIS — E43 Unspecified severe protein-calorie malnutrition: Secondary | ICD-10-CM | POA: Diagnosis not present

## 2021-04-19 DIAGNOSIS — N1831 Chronic kidney disease, stage 3a: Secondary | ICD-10-CM | POA: Diagnosis not present

## 2021-04-19 DIAGNOSIS — E782 Mixed hyperlipidemia: Secondary | ICD-10-CM | POA: Diagnosis not present

## 2021-04-19 DIAGNOSIS — R269 Unspecified abnormalities of gait and mobility: Secondary | ICD-10-CM | POA: Diagnosis not present

## 2021-04-21 DIAGNOSIS — E782 Mixed hyperlipidemia: Secondary | ICD-10-CM | POA: Diagnosis not present

## 2021-04-21 DIAGNOSIS — E538 Deficiency of other specified B group vitamins: Secondary | ICD-10-CM | POA: Diagnosis not present

## 2021-04-21 DIAGNOSIS — I129 Hypertensive chronic kidney disease with stage 1 through stage 4 chronic kidney disease, or unspecified chronic kidney disease: Secondary | ICD-10-CM | POA: Diagnosis not present

## 2021-04-21 DIAGNOSIS — N1831 Chronic kidney disease, stage 3a: Secondary | ICD-10-CM | POA: Diagnosis not present

## 2021-04-21 DIAGNOSIS — E43 Unspecified severe protein-calorie malnutrition: Secondary | ICD-10-CM | POA: Diagnosis not present

## 2021-04-21 DIAGNOSIS — R269 Unspecified abnormalities of gait and mobility: Secondary | ICD-10-CM | POA: Diagnosis not present

## 2021-04-24 DIAGNOSIS — Z20822 Contact with and (suspected) exposure to covid-19: Secondary | ICD-10-CM | POA: Diagnosis not present

## 2021-04-25 DIAGNOSIS — E782 Mixed hyperlipidemia: Secondary | ICD-10-CM | POA: Diagnosis not present

## 2021-04-25 DIAGNOSIS — E43 Unspecified severe protein-calorie malnutrition: Secondary | ICD-10-CM | POA: Diagnosis not present

## 2021-04-25 DIAGNOSIS — E538 Deficiency of other specified B group vitamins: Secondary | ICD-10-CM | POA: Diagnosis not present

## 2021-04-25 DIAGNOSIS — R269 Unspecified abnormalities of gait and mobility: Secondary | ICD-10-CM | POA: Diagnosis not present

## 2021-04-25 DIAGNOSIS — I129 Hypertensive chronic kidney disease with stage 1 through stage 4 chronic kidney disease, or unspecified chronic kidney disease: Secondary | ICD-10-CM | POA: Diagnosis not present

## 2021-04-25 DIAGNOSIS — N1831 Chronic kidney disease, stage 3a: Secondary | ICD-10-CM | POA: Diagnosis not present

## 2021-04-27 DIAGNOSIS — E43 Unspecified severe protein-calorie malnutrition: Secondary | ICD-10-CM | POA: Diagnosis not present

## 2021-04-27 DIAGNOSIS — E782 Mixed hyperlipidemia: Secondary | ICD-10-CM | POA: Diagnosis not present

## 2021-04-27 DIAGNOSIS — N1831 Chronic kidney disease, stage 3a: Secondary | ICD-10-CM | POA: Diagnosis not present

## 2021-04-27 DIAGNOSIS — I129 Hypertensive chronic kidney disease with stage 1 through stage 4 chronic kidney disease, or unspecified chronic kidney disease: Secondary | ICD-10-CM | POA: Diagnosis not present

## 2021-04-27 DIAGNOSIS — R269 Unspecified abnormalities of gait and mobility: Secondary | ICD-10-CM | POA: Diagnosis not present

## 2021-04-27 DIAGNOSIS — E538 Deficiency of other specified B group vitamins: Secondary | ICD-10-CM | POA: Diagnosis not present

## 2021-05-02 DIAGNOSIS — R269 Unspecified abnormalities of gait and mobility: Secondary | ICD-10-CM | POA: Diagnosis not present

## 2021-05-02 DIAGNOSIS — I129 Hypertensive chronic kidney disease with stage 1 through stage 4 chronic kidney disease, or unspecified chronic kidney disease: Secondary | ICD-10-CM | POA: Diagnosis not present

## 2021-05-02 DIAGNOSIS — N1831 Chronic kidney disease, stage 3a: Secondary | ICD-10-CM | POA: Diagnosis not present

## 2021-05-02 DIAGNOSIS — E43 Unspecified severe protein-calorie malnutrition: Secondary | ICD-10-CM | POA: Diagnosis not present

## 2021-05-02 DIAGNOSIS — E538 Deficiency of other specified B group vitamins: Secondary | ICD-10-CM | POA: Diagnosis not present

## 2021-05-02 DIAGNOSIS — E782 Mixed hyperlipidemia: Secondary | ICD-10-CM | POA: Diagnosis not present

## 2021-05-04 DIAGNOSIS — E782 Mixed hyperlipidemia: Secondary | ICD-10-CM | POA: Diagnosis not present

## 2021-05-04 DIAGNOSIS — I129 Hypertensive chronic kidney disease with stage 1 through stage 4 chronic kidney disease, or unspecified chronic kidney disease: Secondary | ICD-10-CM | POA: Diagnosis not present

## 2021-05-04 DIAGNOSIS — N1831 Chronic kidney disease, stage 3a: Secondary | ICD-10-CM | POA: Diagnosis not present

## 2021-05-04 DIAGNOSIS — E43 Unspecified severe protein-calorie malnutrition: Secondary | ICD-10-CM | POA: Diagnosis not present

## 2021-05-04 DIAGNOSIS — R269 Unspecified abnormalities of gait and mobility: Secondary | ICD-10-CM | POA: Diagnosis not present

## 2021-05-04 DIAGNOSIS — E538 Deficiency of other specified B group vitamins: Secondary | ICD-10-CM | POA: Diagnosis not present

## 2021-05-09 DIAGNOSIS — I129 Hypertensive chronic kidney disease with stage 1 through stage 4 chronic kidney disease, or unspecified chronic kidney disease: Secondary | ICD-10-CM | POA: Diagnosis not present

## 2021-05-09 DIAGNOSIS — E538 Deficiency of other specified B group vitamins: Secondary | ICD-10-CM | POA: Diagnosis not present

## 2021-05-09 DIAGNOSIS — E43 Unspecified severe protein-calorie malnutrition: Secondary | ICD-10-CM | POA: Diagnosis not present

## 2021-05-09 DIAGNOSIS — N1831 Chronic kidney disease, stage 3a: Secondary | ICD-10-CM | POA: Diagnosis not present

## 2021-05-09 DIAGNOSIS — R269 Unspecified abnormalities of gait and mobility: Secondary | ICD-10-CM | POA: Diagnosis not present

## 2021-05-09 DIAGNOSIS — E782 Mixed hyperlipidemia: Secondary | ICD-10-CM | POA: Diagnosis not present

## 2021-05-10 DIAGNOSIS — E538 Deficiency of other specified B group vitamins: Secondary | ICD-10-CM | POA: Diagnosis not present

## 2021-05-10 DIAGNOSIS — N1831 Chronic kidney disease, stage 3a: Secondary | ICD-10-CM | POA: Diagnosis not present

## 2021-05-10 DIAGNOSIS — E782 Mixed hyperlipidemia: Secondary | ICD-10-CM | POA: Diagnosis not present

## 2021-05-10 DIAGNOSIS — F411 Generalized anxiety disorder: Secondary | ICD-10-CM | POA: Diagnosis not present

## 2021-05-10 DIAGNOSIS — E43 Unspecified severe protein-calorie malnutrition: Secondary | ICD-10-CM | POA: Diagnosis not present

## 2021-05-10 DIAGNOSIS — R269 Unspecified abnormalities of gait and mobility: Secondary | ICD-10-CM | POA: Diagnosis not present

## 2021-05-10 DIAGNOSIS — I129 Hypertensive chronic kidney disease with stage 1 through stage 4 chronic kidney disease, or unspecified chronic kidney disease: Secondary | ICD-10-CM | POA: Diagnosis not present

## 2021-05-10 DIAGNOSIS — N400 Enlarged prostate without lower urinary tract symptoms: Secondary | ICD-10-CM | POA: Diagnosis not present

## 2021-05-10 DIAGNOSIS — Z931 Gastrostomy status: Secondary | ICD-10-CM | POA: Diagnosis not present

## 2021-05-16 DIAGNOSIS — N1831 Chronic kidney disease, stage 3a: Secondary | ICD-10-CM | POA: Diagnosis not present

## 2021-05-16 DIAGNOSIS — R269 Unspecified abnormalities of gait and mobility: Secondary | ICD-10-CM | POA: Diagnosis not present

## 2021-05-16 DIAGNOSIS — I129 Hypertensive chronic kidney disease with stage 1 through stage 4 chronic kidney disease, or unspecified chronic kidney disease: Secondary | ICD-10-CM | POA: Diagnosis not present

## 2021-05-16 DIAGNOSIS — E43 Unspecified severe protein-calorie malnutrition: Secondary | ICD-10-CM | POA: Diagnosis not present

## 2021-05-16 DIAGNOSIS — E538 Deficiency of other specified B group vitamins: Secondary | ICD-10-CM | POA: Diagnosis not present

## 2021-05-16 DIAGNOSIS — E782 Mixed hyperlipidemia: Secondary | ICD-10-CM | POA: Diagnosis not present

## 2021-05-23 DIAGNOSIS — E782 Mixed hyperlipidemia: Secondary | ICD-10-CM | POA: Diagnosis not present

## 2021-05-23 DIAGNOSIS — E538 Deficiency of other specified B group vitamins: Secondary | ICD-10-CM | POA: Diagnosis not present

## 2021-05-23 DIAGNOSIS — R269 Unspecified abnormalities of gait and mobility: Secondary | ICD-10-CM | POA: Diagnosis not present

## 2021-05-23 DIAGNOSIS — E43 Unspecified severe protein-calorie malnutrition: Secondary | ICD-10-CM | POA: Diagnosis not present

## 2021-05-23 DIAGNOSIS — N1831 Chronic kidney disease, stage 3a: Secondary | ICD-10-CM | POA: Diagnosis not present

## 2021-05-23 DIAGNOSIS — I129 Hypertensive chronic kidney disease with stage 1 through stage 4 chronic kidney disease, or unspecified chronic kidney disease: Secondary | ICD-10-CM | POA: Diagnosis not present

## 2021-05-28 DIAGNOSIS — R531 Weakness: Secondary | ICD-10-CM | POA: Diagnosis not present

## 2021-05-28 DIAGNOSIS — R5381 Other malaise: Secondary | ICD-10-CM | POA: Diagnosis not present

## 2021-06-21 DIAGNOSIS — R1310 Dysphagia, unspecified: Secondary | ICD-10-CM | POA: Diagnosis not present

## 2021-06-21 DIAGNOSIS — R635 Abnormal weight gain: Secondary | ICD-10-CM | POA: Diagnosis not present

## 2021-06-21 DIAGNOSIS — R269 Unspecified abnormalities of gait and mobility: Secondary | ICD-10-CM | POA: Diagnosis not present

## 2021-08-15 DIAGNOSIS — L989 Disorder of the skin and subcutaneous tissue, unspecified: Secondary | ICD-10-CM | POA: Diagnosis not present

## 2021-08-15 DIAGNOSIS — E43 Unspecified severe protein-calorie malnutrition: Secondary | ICD-10-CM | POA: Diagnosis not present

## 2021-08-21 DIAGNOSIS — N1831 Chronic kidney disease, stage 3a: Secondary | ICD-10-CM | POA: Diagnosis not present

## 2021-08-21 DIAGNOSIS — E43 Unspecified severe protein-calorie malnutrition: Secondary | ICD-10-CM | POA: Diagnosis not present

## 2021-08-21 DIAGNOSIS — E785 Hyperlipidemia, unspecified: Secondary | ICD-10-CM | POA: Diagnosis not present

## 2021-08-21 DIAGNOSIS — L989 Disorder of the skin and subcutaneous tissue, unspecified: Secondary | ICD-10-CM | POA: Diagnosis not present

## 2021-08-21 DIAGNOSIS — M6281 Muscle weakness (generalized): Secondary | ICD-10-CM | POA: Diagnosis not present

## 2021-08-21 DIAGNOSIS — R059 Cough, unspecified: Secondary | ICD-10-CM | POA: Diagnosis not present

## 2021-08-21 DIAGNOSIS — R1310 Dysphagia, unspecified: Secondary | ICD-10-CM | POA: Diagnosis not present

## 2021-08-26 ENCOUNTER — Encounter (HOSPITAL_COMMUNITY): Payer: Self-pay

## 2021-08-26 ENCOUNTER — Emergency Department (HOSPITAL_COMMUNITY): Payer: Medicare Other

## 2021-08-26 ENCOUNTER — Other Ambulatory Visit: Payer: Self-pay

## 2021-08-26 ENCOUNTER — Inpatient Hospital Stay (HOSPITAL_COMMUNITY)
Admission: EM | Admit: 2021-08-26 | Discharge: 2021-09-02 | DRG: 291 | Disposition: A | Discharge status: Skilled Nursing Facility | Payer: Medicare Other | Attending: Internal Medicine | Admitting: Internal Medicine

## 2021-08-26 DIAGNOSIS — I4891 Unspecified atrial fibrillation: Secondary | ICD-10-CM | POA: Diagnosis not present

## 2021-08-26 DIAGNOSIS — I493 Ventricular premature depolarization: Secondary | ICD-10-CM | POA: Diagnosis present

## 2021-08-26 DIAGNOSIS — R739 Hyperglycemia, unspecified: Secondary | ICD-10-CM | POA: Diagnosis present

## 2021-08-26 DIAGNOSIS — I959 Hypotension, unspecified: Secondary | ICD-10-CM | POA: Diagnosis present

## 2021-08-26 DIAGNOSIS — N400 Enlarged prostate without lower urinary tract symptoms: Secondary | ICD-10-CM | POA: Diagnosis not present

## 2021-08-26 DIAGNOSIS — E873 Alkalosis: Secondary | ICD-10-CM | POA: Diagnosis present

## 2021-08-26 DIAGNOSIS — Z7982 Long term (current) use of aspirin: Secondary | ICD-10-CM

## 2021-08-26 DIAGNOSIS — J9601 Acute respiratory failure with hypoxia: Secondary | ICD-10-CM | POA: Diagnosis present

## 2021-08-26 DIAGNOSIS — I248 Other forms of acute ischemic heart disease: Secondary | ICD-10-CM | POA: Diagnosis present

## 2021-08-26 DIAGNOSIS — R7989 Other specified abnormal findings of blood chemistry: Secondary | ICD-10-CM

## 2021-08-26 DIAGNOSIS — Z931 Gastrostomy status: Secondary | ICD-10-CM | POA: Diagnosis not present

## 2021-08-26 DIAGNOSIS — Z8249 Family history of ischemic heart disease and other diseases of the circulatory system: Secondary | ICD-10-CM | POA: Diagnosis not present

## 2021-08-26 DIAGNOSIS — Z87891 Personal history of nicotine dependence: Secondary | ICD-10-CM

## 2021-08-26 DIAGNOSIS — I5041 Acute combined systolic (congestive) and diastolic (congestive) heart failure: Secondary | ICD-10-CM | POA: Diagnosis present

## 2021-08-26 DIAGNOSIS — K8689 Other specified diseases of pancreas: Secondary | ICD-10-CM | POA: Diagnosis not present

## 2021-08-26 DIAGNOSIS — R059 Cough, unspecified: Secondary | ICD-10-CM | POA: Diagnosis not present

## 2021-08-26 DIAGNOSIS — Z515 Encounter for palliative care: Secondary | ICD-10-CM

## 2021-08-26 DIAGNOSIS — I110 Hypertensive heart disease with heart failure: Secondary | ICD-10-CM | POA: Diagnosis not present

## 2021-08-26 DIAGNOSIS — R1312 Dysphagia, oropharyngeal phase: Secondary | ICD-10-CM | POA: Diagnosis not present

## 2021-08-26 DIAGNOSIS — K5790 Diverticulosis of intestine, part unspecified, without perforation or abscess without bleeding: Secondary | ICD-10-CM | POA: Diagnosis not present

## 2021-08-26 DIAGNOSIS — R627 Adult failure to thrive: Secondary | ICD-10-CM | POA: Diagnosis present

## 2021-08-26 DIAGNOSIS — I509 Heart failure, unspecified: Principal | ICD-10-CM

## 2021-08-26 DIAGNOSIS — Z7189 Other specified counseling: Secondary | ICD-10-CM | POA: Diagnosis not present

## 2021-08-26 DIAGNOSIS — R488 Other symbolic dysfunctions: Secondary | ICD-10-CM | POA: Diagnosis not present

## 2021-08-26 DIAGNOSIS — Z79899 Other long term (current) drug therapy: Secondary | ICD-10-CM | POA: Diagnosis not present

## 2021-08-26 DIAGNOSIS — M47816 Spondylosis without myelopathy or radiculopathy, lumbar region: Secondary | ICD-10-CM | POA: Diagnosis not present

## 2021-08-26 DIAGNOSIS — E43 Unspecified severe protein-calorie malnutrition: Secondary | ICD-10-CM | POA: Diagnosis not present

## 2021-08-26 DIAGNOSIS — R41841 Cognitive communication deficit: Secondary | ICD-10-CM | POA: Diagnosis not present

## 2021-08-26 DIAGNOSIS — E785 Hyperlipidemia, unspecified: Secondary | ICD-10-CM | POA: Diagnosis not present

## 2021-08-26 DIAGNOSIS — M6281 Muscle weakness (generalized): Secondary | ICD-10-CM | POA: Diagnosis not present

## 2021-08-26 DIAGNOSIS — L0390 Cellulitis, unspecified: Secondary | ICD-10-CM

## 2021-08-26 DIAGNOSIS — R079 Chest pain, unspecified: Secondary | ICD-10-CM | POA: Diagnosis not present

## 2021-08-26 DIAGNOSIS — Z961 Presence of intraocular lens: Secondary | ICD-10-CM | POA: Diagnosis present

## 2021-08-26 DIAGNOSIS — E871 Hypo-osmolality and hyponatremia: Secondary | ICD-10-CM

## 2021-08-26 DIAGNOSIS — Z87442 Personal history of urinary calculi: Secondary | ICD-10-CM

## 2021-08-26 DIAGNOSIS — K2100 Gastro-esophageal reflux disease with esophagitis, without bleeding: Secondary | ICD-10-CM | POA: Diagnosis not present

## 2021-08-26 DIAGNOSIS — R0602 Shortness of breath: Secondary | ICD-10-CM | POA: Diagnosis not present

## 2021-08-26 DIAGNOSIS — I5021 Acute systolic (congestive) heart failure: Secondary | ICD-10-CM | POA: Diagnosis not present

## 2021-08-26 DIAGNOSIS — K219 Gastro-esophageal reflux disease without esophagitis: Secondary | ICD-10-CM | POA: Diagnosis present

## 2021-08-26 DIAGNOSIS — L03116 Cellulitis of left lower limb: Secondary | ICD-10-CM | POA: Diagnosis present

## 2021-08-26 DIAGNOSIS — R54 Age-related physical debility: Secondary | ICD-10-CM | POA: Diagnosis not present

## 2021-08-26 DIAGNOSIS — Z66 Do not resuscitate: Secondary | ICD-10-CM | POA: Diagnosis present

## 2021-08-26 DIAGNOSIS — R609 Edema, unspecified: Secondary | ICD-10-CM | POA: Diagnosis not present

## 2021-08-26 DIAGNOSIS — R0600 Dyspnea, unspecified: Secondary | ICD-10-CM | POA: Diagnosis not present

## 2021-08-26 DIAGNOSIS — R531 Weakness: Secondary | ICD-10-CM

## 2021-08-26 DIAGNOSIS — K6389 Other specified diseases of intestine: Secondary | ICD-10-CM | POA: Diagnosis not present

## 2021-08-26 DIAGNOSIS — K5939 Other megacolon: Secondary | ICD-10-CM | POA: Diagnosis not present

## 2021-08-26 DIAGNOSIS — I10 Essential (primary) hypertension: Secondary | ICD-10-CM | POA: Diagnosis present

## 2021-08-26 DIAGNOSIS — Z431 Encounter for attention to gastrostomy: Secondary | ICD-10-CM | POA: Diagnosis not present

## 2021-08-26 DIAGNOSIS — N1832 Chronic kidney disease, stage 3b: Secondary | ICD-10-CM | POA: Diagnosis not present

## 2021-08-26 DIAGNOSIS — I5020 Unspecified systolic (congestive) heart failure: Principal | ICD-10-CM

## 2021-08-26 DIAGNOSIS — Z9842 Cataract extraction status, left eye: Secondary | ICD-10-CM

## 2021-08-26 DIAGNOSIS — N289 Disorder of kidney and ureter, unspecified: Secondary | ICD-10-CM | POA: Diagnosis not present

## 2021-08-26 DIAGNOSIS — K567 Ileus, unspecified: Secondary | ICD-10-CM | POA: Diagnosis present

## 2021-08-26 DIAGNOSIS — R279 Unspecified lack of coordination: Secondary | ICD-10-CM | POA: Diagnosis not present

## 2021-08-26 DIAGNOSIS — R778 Other specified abnormalities of plasma proteins: Secondary | ICD-10-CM | POA: Diagnosis not present

## 2021-08-26 DIAGNOSIS — E010 Iodine-deficiency related diffuse (endemic) goiter: Secondary | ICD-10-CM | POA: Diagnosis not present

## 2021-08-26 DIAGNOSIS — R140 Abdominal distension (gaseous): Secondary | ICD-10-CM | POA: Diagnosis not present

## 2021-08-26 DIAGNOSIS — K7689 Other specified diseases of liver: Secondary | ICD-10-CM | POA: Diagnosis not present

## 2021-08-26 DIAGNOSIS — R293 Abnormal posture: Secondary | ICD-10-CM | POA: Diagnosis not present

## 2021-08-26 DIAGNOSIS — Z6822 Body mass index (BMI) 22.0-22.9, adult: Secondary | ICD-10-CM | POA: Diagnosis not present

## 2021-08-26 DIAGNOSIS — R109 Unspecified abdominal pain: Secondary | ICD-10-CM | POA: Diagnosis not present

## 2021-08-26 DIAGNOSIS — R278 Other lack of coordination: Secondary | ICD-10-CM | POA: Diagnosis not present

## 2021-08-26 DIAGNOSIS — R0689 Other abnormalities of breathing: Secondary | ICD-10-CM | POA: Diagnosis not present

## 2021-08-26 DIAGNOSIS — Z741 Need for assistance with personal care: Secondary | ICD-10-CM | POA: Diagnosis not present

## 2021-08-26 DIAGNOSIS — Z9181 History of falling: Secondary | ICD-10-CM | POA: Diagnosis not present

## 2021-08-26 DIAGNOSIS — Z96643 Presence of artificial hip joint, bilateral: Secondary | ICD-10-CM | POA: Diagnosis present

## 2021-08-26 LAB — COMPREHENSIVE METABOLIC PANEL
ALT: 21 U/L (ref 0–44)
AST: 20 U/L (ref 15–41)
Albumin: 3.7 g/dL (ref 3.5–5.0)
Alkaline Phosphatase: 74 U/L (ref 38–126)
Anion gap: 9 (ref 5–15)
BUN: 36 mg/dL — ABNORMAL HIGH (ref 8–23)
CO2: 27 mmol/L (ref 22–32)
Calcium: 7.9 mg/dL — ABNORMAL LOW (ref 8.9–10.3)
Chloride: 85 mmol/L — ABNORMAL LOW (ref 98–111)
Creatinine, Ser: 0.95 mg/dL (ref 0.61–1.24)
GFR, Estimated: >60 mL/min (ref >60)
Glucose, Bld: 228 mg/dL — ABNORMAL HIGH (ref 70–99)
Potassium: 4.5 mmol/L (ref 3.5–5.1)
Sodium: 121 mmol/L — ABNORMAL LOW (ref 135–145)
Total Bilirubin: 0.6 mg/dL (ref 0.3–1.2)
Total Protein: 6.7 g/dL (ref 6.5–8.1)

## 2021-08-26 LAB — BRAIN NATRIURETIC PEPTIDE: B Natriuretic Peptide: 873 pg/mL — ABNORMAL HIGH (ref 0.0–100.0)

## 2021-08-26 LAB — CBC WITH DIFFERENTIAL/PLATELET
Abs Immature Granulocytes: 0.05 10*3/uL (ref 0.00–0.07)
Basophils Absolute: 0 10*3/uL (ref 0.0–0.1)
Basophils Relative: 1 %
Eosinophils Absolute: 0 10*3/uL (ref 0.0–0.5)
Eosinophils Relative: 0 %
HCT: 38 % — ABNORMAL LOW (ref 39.0–52.0)
Hemoglobin: 13.2 g/dL (ref 13.0–17.0)
Immature Granulocytes: 1 %
Lymphocytes Relative: 5 %
Lymphs Abs: 0.4 10*3/uL — ABNORMAL LOW (ref 0.7–4.0)
MCH: 31.4 pg (ref 26.0–34.0)
MCHC: 34.7 g/dL (ref 30.0–36.0)
MCV: 90.5 fL (ref 80.0–100.0)
Monocytes Absolute: 0.6 10*3/uL (ref 0.1–1.0)
Monocytes Relative: 7 %
Neutro Abs: 7.1 10*3/uL (ref 1.7–7.7)
Neutrophils Relative %: 86 %
Platelets: 217 10*3/uL (ref 150–400)
RBC: 4.2 MIL/uL — ABNORMAL LOW (ref 4.22–5.81)
RDW: 13.3 % (ref 11.5–15.5)
WBC: 8.2 10*3/uL (ref 4.0–10.5)
nRBC: 0 % (ref 0.0–0.2)

## 2021-08-26 LAB — GLUCOSE, CAPILLARY: Glucose-Capillary: 110 mg/dL — ABNORMAL HIGH (ref 70–99)

## 2021-08-26 LAB — TROPONIN I (HIGH SENSITIVITY)
Troponin I (High Sensitivity): 62 ng/L — ABNORMAL HIGH (ref <18)
Troponin I (High Sensitivity): 70 ng/L — ABNORMAL HIGH (ref <18)

## 2021-08-26 LAB — PROCALCITONIN: Procalcitonin: <0.1 ng/mL

## 2021-08-26 MED ORDER — CLONIDINE HCL 0.1 MG/24HR TD PTWK: 0.1000 mg | MEDICATED_PATCH | TRANSDERMAL | Status: DC

## 2021-08-26 MED ORDER — ORAL CARE MOUTH RINSE
15.0000 mL | OROMUCOSAL | Status: DC
  Administered 2021-08-27 – 2021-08-30 (×15): 15 mL via OROMUCOSAL

## 2021-08-26 MED ORDER — FLUOROMETHOLONE 0.1 % OP SUSP
1.0000 [drp] | Freq: Two times a day (BID) | OPHTHALMIC | Status: DC
  Administered 2021-08-27 – 2021-09-02 (×11): 1 [drp] via OPHTHALMIC
  Filled 2021-08-26: qty 5

## 2021-08-26 MED ORDER — ASPIRIN 81 MG PO CHEW: 81.0000 mg | CHEWABLE_TABLET | Freq: Every day | ORAL | Status: DC

## 2021-08-26 MED ORDER — POLYETHYLENE GLYCOL 3350 17 G PO PACK
17.0000 g | PACK | Freq: Every day | ORAL | Status: DC | PRN
  Administered 2021-08-30: 17 g
  Filled 2021-08-26: qty 1

## 2021-08-26 MED ORDER — ACETAMINOPHEN 325 MG PO TABS: 650.0000 mg | ORAL_TABLET | Freq: Four times a day (QID) | ORAL | Status: DC | PRN

## 2021-08-26 MED ORDER — HEPARIN SODIUM (PORCINE) 5000 UNIT/ML IJ SOLN
5000.0000 [IU] | Freq: Three times a day (TID) | INTRAMUSCULAR | Status: DC
  Administered 2021-08-26 – 2021-08-31 (×13): 5000 [IU] via SUBCUTANEOUS
  Filled 2021-08-26 (×13): qty 1

## 2021-08-26 MED ORDER — ASPIRIN 81 MG PO TBEC: 81.0000 mg | DELAYED_RELEASE_TABLET | Freq: Every day | ORAL | Status: DC

## 2021-08-26 MED ORDER — JEVITY 1.2 CAL PO LIQD: 474.0000 mL | ORAL | Status: DC

## 2021-08-26 MED ORDER — ONDANSETRON HCL 4 MG PO TABS: 4.0000 mg | ORAL_TABLET | Freq: Four times a day (QID) | ORAL | Status: DC | PRN

## 2021-08-26 MED ORDER — CHLORHEXIDINE GLUCONATE CLOTH 2 % EX PADS
6.0000 | MEDICATED_PAD | Freq: Every day | CUTANEOUS | Status: DC
  Administered 2021-08-26 – 2021-08-31 (×6): 6 via TOPICAL

## 2021-08-26 MED ORDER — FUROSEMIDE 10 MG/ML IJ SOLN
40.0000 mg | Freq: Two times a day (BID) | INTRAMUSCULAR | Status: DC
  Administered 2021-08-26 – 2021-08-30 (×8): 40 mg via INTRAVENOUS
  Filled 2021-08-26 (×8): qty 4

## 2021-08-26 MED ORDER — ACETAMINOPHEN 650 MG RE SUPP: 650.0000 mg | Freq: Four times a day (QID) | RECTAL | Status: DC | PRN

## 2021-08-26 MED ORDER — JEVITY 1.2 CAL PO LIQD
474.0000 mL | ORAL | Status: AC
  Administered 2021-08-27 (×3): 474 mL

## 2021-08-26 MED ORDER — ALBUTEROL SULFATE (2.5 MG/3ML) 0.083% IN NEBU
INHALATION_SOLUTION | RESPIRATORY_TRACT | Status: AC
  Administered 2021-08-26: 2.5 mg
  Filled 2021-08-26: qty 3

## 2021-08-26 MED ORDER — VITAL HIGH PROTEIN PO LIQD: 1000.0000 mL | ORAL | Status: DC

## 2021-08-26 MED ORDER — ONDANSETRON HCL 4 MG/2ML IJ SOLN: 4.0000 mg | Freq: Four times a day (QID) | INTRAMUSCULAR | Status: DC | PRN

## 2021-08-26 MED ORDER — ALBUTEROL SULFATE (2.5 MG/3ML) 0.083% IN NEBU
2.5000 mg | INHALATION_SOLUTION | RESPIRATORY_TRACT | Status: DC | PRN
  Administered 2021-08-26 – 2021-08-27 (×2): 2.5 mg via RESPIRATORY_TRACT
  Filled 2021-08-26 (×2): qty 3

## 2021-08-26 MED ORDER — IPRATROPIUM-ALBUTEROL 0.5-2.5 (3) MG/3ML IN SOLN: 3.0000 mL | Freq: Once | RESPIRATORY_TRACT | Status: AC

## 2021-08-26 MED ORDER — OXYCODONE HCL 5 MG PO TABS: 5.0000 mg | ORAL_TABLET | ORAL | Status: DC | PRN

## 2021-08-26 MED ORDER — FUROSEMIDE 10 MG/ML IJ SOLN
40.0000 mg | Freq: Once | INTRAMUSCULAR | Status: AC
  Administered 2021-08-26: 40 mg via INTRAVENOUS
  Filled 2021-08-26: qty 4

## 2021-08-26 MED ORDER — IPRATROPIUM-ALBUTEROL 0.5-2.5 (3) MG/3ML IN SOLN
RESPIRATORY_TRACT | Status: AC
  Administered 2021-08-26: 3 mL via RESPIRATORY_TRACT
  Filled 2021-08-26: qty 3

## 2021-08-26 MED ORDER — ORAL CARE MOUTH RINSE: 15.0000 mL | OROMUCOSAL | Status: DC | PRN

## 2021-08-26 MED ORDER — ROSUVASTATIN CALCIUM 20 MG PO TABS
20.0000 mg | ORAL_TABLET | Freq: Every day | ORAL | Status: DC
  Administered 2021-08-26: 20 mg
  Filled 2021-08-26: qty 1

## 2021-08-26 MED ORDER — ASPIRIN 81 MG PO CHEW
81.0000 mg | CHEWABLE_TABLET | Freq: Every day | ORAL | Status: DC
  Administered 2021-08-26 – 2021-09-02 (×8): 81 mg
  Filled 2021-08-26 (×10): qty 1

## 2021-08-26 MED ORDER — MORPHINE SULFATE (PF) 2 MG/ML IV SOLN
2.0000 mg | INTRAVENOUS | Status: DC | PRN
  Administered 2021-08-29 (×2): 2 mg via INTRAVENOUS
  Filled 2021-08-26 (×2): qty 1

## 2021-08-26 MED ORDER — SODIUM CHLORIDE 0.9 % IV SOLN
1.0000 g | INTRAVENOUS | Status: DC
  Administered 2021-08-26 – 2021-08-31 (×6): 1 g via INTRAVENOUS
  Filled 2021-08-26 (×6): qty 10

## 2021-08-26 MED ORDER — ROSUVASTATIN CALCIUM 20 MG PO TABS: 20.0000 mg | ORAL_TABLET | Freq: Every day | ORAL | Status: DC

## 2021-08-26 MED ORDER — FREE WATER
200.0000 mL | Freq: Three times a day (TID) | Status: DC
  Administered 2021-08-26 – 2021-09-02 (×23): 200 mL

## 2021-08-26 MED ORDER — FAMOTIDINE 20 MG PO TABS
20.0000 mg | ORAL_TABLET | Freq: Every day | ORAL | Status: DC
  Administered 2021-08-26 – 2021-09-01 (×7): 20 mg via ORAL
  Filled 2021-08-26 (×7): qty 1

## 2021-08-26 NOTE — Assessment & Plan Note (Signed)
-  US thyroid -TSH: 6.083 -Obtaining free T4 >> 1.30

## 2021-08-26 NOTE — Assessment & Plan Note (Signed)
Continue pepcid

## 2021-08-26 NOTE — ED Provider Notes (Signed)
[facility] Provider Note   CSN: [id number] Arrival date & time: 08/26/21  1722     History {Add pertinent medical, surgical, social history, OB history to HPI:1} Chief Complaint  Patient presents with   Shortness of Breath    D[facility] is a 88 y.o. male.  Patient has a history of hypertension and has a PEG tube for nutrition.  He comes in complaining of swelling in his legs and shortness of breath.   Shortness of Breath      Home Medications Prior to Admission medications   Medication Sig Start Date End Date Taking? Authorizing Provider  acetaminophen (TYLENOL) 160 MG/5ML elixir Place 325 mg into feeding tube every 6 (six) hours as needed for fever. Give 650 mg Patient not taking: Reported on 10/06/2020    [provider]  aspirin EC 81 MG tablet Take 81 mg by mouth daily. Swallow whole.    [provider]  azithromycin (ZITHROMAX) 250 MG tablet Take 1 tablet (250 mg total) by mouth daily. Patient not taking: Reported on 10/06/2020 08/21/20   Veryl Speak, MD  benzonatate (TESSALON) 100 MG capsule Take 1 capsule (100 mg total) by mouth every 8 (eight) hours. 08/21/20   Veryl Speak, MD  Cholecalciferol (VITAMIN D) 125 MCG (5000 UT) CAPS 5,000 Units by PEG Tube route daily in the afternoon. 0900    [provider]  cloNIDine (CATAPRES - DOSED IN MG/24 HR) 0.1 mg/24hr patch Place 1 patch (0.1 mg total) onto the skin once a week. Place on Fridays 05/24/20   Gerlene Fee, NP  famotidine (PEPCID) 40 MG/5ML suspension Take 2.5 mLs (20 mg total) by mouth at bedtime. 06/08/20   Tanda Rockers, MD  fluorometholone (FML) 0.1 % ophthalmic suspension Place 1 drop into both eyes 2 (two) times daily. 0900 & 2100 05/24/20   Gerlene Fee, NP  guaiFENesin (MUCINEX PO) Take by mouth. Mucinex two times a day    [provider]  Multiple Vitamin (MULTIVITAMIN) tablet Take 1 tablet by mouth daily.    [provider]   polyethylene glycol (MIRALAX / GLYCOLAX) 17 g packet Place 17 g into feeding tube daily as needed. Patient not taking: Reported on 10/06/2020    [provider]  Probiotic Product (PROBIOTIC DAILY PO) Take by mouth. One daily    [provider]  rosuvastatin (CRESTOR) 20 MG tablet Place 1 tablet (20 mg total) into feeding tube at bedtime. 2100 Patient not taking: Reported on 10/06/2020 05/24/20   Gerlene Fee, NP      Allergies    Robaxin [methocarbamol]    Review of Systems   Review of Systems  Respiratory:  Positive for shortness of breath.     Physical Exam Updated Vital Signs BP (!) 145/72   Pulse 90   Temp 98.1 F (36.7 C)   Resp (!) 31   Ht '6\' 5"'$  (1.956 m)   Wt 75 kg   SpO2 100%   BMI 19.61 kg/m  Physical Exam  ED Results / Procedures / Treatments   Labs (all labs ordered are listed, but only abnormal results are displayed) Labs Reviewed  CBC WITH DIFFERENTIAL/PLATELET - Abnormal; Notable for the following components:      Result Value   RBC 4.20 (*)    HCT 38.0 (*)    Lymphs Abs 0.4 (*)    All other components within normal limits  COMPREHENSIVE METABOLIC PANEL - Abnormal; Notable for the following components:  Sodium 121 (*)    Chloride 85 (*)    Glucose, Bld 228 (*)    BUN 36 (*)    Calcium 7.9 (*)    All other components within normal limits  BRAIN NATRIURETIC PEPTIDE - Abnormal; Notable for the following components:   B Natriuretic Peptide 873.0 (*)    All other components within normal limits  TROPONIN I (HIGH SENSITIVITY) - Abnormal; Notable for the following components:   Troponin I (High Sensitivity) 62 (*)    All other components within normal limits  TROPONIN I (HIGH SENSITIVITY)    EKG EKG Interpretation  Date/Time:  Saturday August 26 2021 17:37:02 EDT Ventricular Rate:  94 PR Interval:  257 QRS Duration: 124 QT Interval:  390 QTC Calculation: 488 R Axis:   -70 Text Interpretation: Sinus rhythm Prolonged PR  interval Nonspecific IVCD with LAD Consider anterior infarct Confirmed by Milton Ferguson [phone]) on 08/26/2021 7:03:05 PM  Radiology DG Chest Port 1 View  Result Date: 08/26/2021 CLINICAL DATA:  Shortness of breath EXAM: PORTABLE CHEST 1 VIEW COMPARISON:  10/27/2020 FINDINGS: Cardiomegaly. Mild, diffuse bilateral interstitial pulmonary opacity. The visualized skeletal structures are unremarkable. IMPRESSION: Cardiomegaly with mild, diffuse bilateral interstitial pulmonary opacity, likely edema. No focal airspace opacity. Electronically Signed   By: Delanna Ahmadi M.D.   On: 08/26/2021 18:05    Procedures Procedures  {Document cardiac monitor, telemetry assessment procedure when appropriate:1}  Medications Ordered in ED Medications  ipratropium-albuterol (DUONEB) 0.5-2.5 (3) MG/3ML nebulizer solution 3 mL (3 mLs Nebulization Given 08/26/21 1750)  albuterol (PROVENTIL) (2.5 MG/3ML) 0.083% nebulizer solution (2.5 mg  Given 08/26/21 1750)  furosemide (LASIX) injection 40 mg (40 mg Intravenous Given 08/26/21 1757)    ED Course/ Medical Decision Making/ A&P  CRITICAL CARE Performed by: Milton Ferguson Total critical care time: 40 minutes Critical care time was exclusive of separately billable procedures and treating other patients. Critical care was necessary to treat or prevent imminent or life-threatening deterioration. Critical care was time spent personally by me on the following activities: development of treatment plan with patient and/or surrogate as well as nursing, discussions with consultants, evaluation of patient's response to treatment, examination of patient, obtaining history from patient or surrogate, ordering and performing treatments and interventions, ordering and review of laboratory studies, ordering and review of radiographic studies, pulse oximetry and re-evaluation of patient's condition.                          Medical Decision Making Amount and/or Complexity of Data  Reviewed Labs: ordered. Radiology: ordered. ECG/medicine tests: ordered.  Risk Prescription drug management. Decision regarding hospitalization.   Patient in congestive heart failure and also hyponatremia.  He will be admitted to medicine  {Document critical care time when appropriate:1} {Document review of labs and clinical decision tools ie heart score, Chads2Vasc2 etc:1}  {Document your independent review of radiology images, and any outside records:1} {Document your discussion with family members, caretakers, and with consultants:1} {Document social determinants of health affecting pt's care:1} {Document your decision making why or why not admission, treatments were needed:1} Final Clinical Impression(s) / ED Diagnoses Final diagnoses:  None    Rx / DC Orders ED Discharge Orders     None

## 2021-08-26 NOTE — Assessment & Plan Note (Addendum)
-  Per cardiology combined systolic/diastolic CHF  -Elevated BNP 873, >>> 435 - lower extremity edema,  - shortness of breath with Crackles  -Chest x-ray consistent with cardiomegaly, congestion -Cardiomegaly and pulm edema on cxr, dyspnea, crackles -No prior history of CHF -Echo: Reviewed ejection fraction 27-06%, systolic/diastolic dysfunction,  -Strict intake and output 1.1 L, (total of 3.5 L since admission) - Daily weight   -  ReDs clip reading >> 32  -On 40 mg of Lasix twice daily,  -Free water flushes reduced to comply with fluid restrictions -Continue aspirin and crestor   -Cardiology consulted, appreciate input

## 2021-08-26 NOTE — Assessment & Plan Note (Signed)
-  Na 121, previously 137 -Hypervolemic hyponatremia -Fluid restrictions -diuresis -trend in the AM

## 2021-08-26 NOTE — Assessment & Plan Note (Addendum)
-  Severe global weakness, with severe debility -Ambulates with assist only -deconditioning and chronic illness -pt eval and treat>>SNF

## 2021-08-26 NOTE — Progress Notes (Signed)
Down to 35 on BIPAP, No other problems noted for now.

## 2021-08-26 NOTE — ED Triage Notes (Signed)
Pt to ED from home c/o weakness x2 weeks, swelling in BLE and groin, and increased SOB for 1 hr pta. Pt was 92% on RA on EMS arrival. EMS placed pt on 6L [place] for comfort. 100% on arrival to ED. Pt with labored and fast breathing.

## 2021-08-26 NOTE — Assessment & Plan Note (Addendum)
-  BP has much improved/stable but soft - was Hypotensive, discontinude clonidine -On midodrine---discontinued 08/29/2021

## 2021-08-26 NOTE — Assessment & Plan Note (Addendum)
-  Expected much improved Left lower extremity edema erythema -Was on doxycycline as an outpatient with no improvement   -On admission patient was started on Rocephin - Continue -Pro-Cal less than 0.10, WBC 8.9, Resolved, finished 7 days abx

## 2021-08-26 NOTE — H&P (Signed)
History and Physical    Patient: [facility] IRW:[id number] DOB: Jul 30, 1927 DOA: 08/26/2021 DOS: the patient was seen and examined on 08/26/2021 PCP: Celene Squibb, MD  Patient coming from: Home  Chief Complaint:  Chief Complaint  Patient presents with   Shortness of Breath   HPI: [facility] is a 88 y.o. male with medical history significant of acid reflux, BPH, hyperlipidemia, hypertension, presents ED with a chief complaint of peripheral edema, generalized weakness, and dyspnea.  Patient's daughter provides most of the history.  She reports that patient has been getting heavier over the last week.  Its become a problem because his caretakers can no longer lift him to transfer him to commode.  He is also been progressively more short of breath.  He was working harder to breathe today.  This is why he came into the ER.  They report patient has had rattling breath sounds for quite some time and they have been under the impression that small upper airway.  Patient has dysphagia and is NPO.  He has had a PEG tube chronically.  Today they had him evaluated by hospice nurse.  It was the hospice nurse that also advised him to come into the ER when she saw how edematous, dyspneic, and diaphoretic patient was.  They were told that he cannot be in hospice as he does not have a diagnosis that meets the criteria.  Daughter is interested in palliative care.  Patient has not complained of any chest pain or palpitations.  He has no further complaints at this time.  Continues on PEG tube feedings.  He takes 1.2 kcals Jevity.  He needs 2 cartons at 8 AM, noon, 4 and 1 carton at 8 PM.  He has 300 cc of free water flush with each feeding.  Patient is not able to provide more history at this time as he is working hard to breathe and having difficulty understanding questions. Review of Systems: unable to review all systems due to the inability of the patient to answer questions. Past Medical History:   Diagnosis Date   Acid reflux    mild-states if doesnt chew food completely he begins to cough- states has weakness in muscles due to ilieus- has been doing exreci this   Adynamic ileus ([place]) 12/2014   ? anesthesia related per pt   BPH (benign prostatic hyperplasia)    Complication of anesthesia    10/11/15- per pt "had [place]- in 11/16-related it to possibly anesthesia"   H/O seasonal allergies    Hyperlipidemia    Hypertension    Past Surgical History:  Procedure Laterality Date   APPENDECTOMY     approx 30 years ago/lwb   CATARACT EXTRACTION W/PHACO Left 09/11/2012   Procedure: CATARACT EXTRACTION PHACO AND INTRAOCULAR LENS PLACEMENT ([place]);  Surgeon: Tonny Branch, MD;  Location: [facility];  Service: Ophthalmology;  Laterality: Left;  CDE: 19.52   ESOPHAGOGASTRODUODENOSCOPY (EGD) WITH PROPOFOL N/A 05/03/2020   Procedure: ESOPHAGOGASTRODUODENOSCOPY (EGD) WITH PROPOFOL;  Surgeon: Aviva Signs, MD;  Location: [facility];  Service: General;  Laterality: N/A;   HIP ARTHROPLASTY Right 12/06/2014   Procedure: PARTIAL RIGHT HIP REPLACEMENT;  Surgeon: Carole Civil, MD;  Location: [facility];  Service: Orthopedics;  Laterality: Right;   HIP ARTHROPLASTY Left 03/08/2020   Procedure: ARTHROPLASTY BIPOLAR HIP (HEMIARTHROPLASTY);  Surgeon: Mordecai Rasmussen, MD;  Location: [facility];  Service: Orthopedics;  Laterality: Left;   LUMBAR LAMINECTOMY/DECOMPRESSION MICRODISCECTOMY N/A 10/19/2015  Procedure: CENTRAL DECOMPRESSION LUMBAR LAMINECTOMY FOR SPINAL STENOSIS, L3,L4FORAMINOTOMY BILATERAL L3,L4 NERVE ROOT;  Surgeon: Latanya Maudlin, MD;  Location: [facility];  Service: Orthopedics;  Laterality: N/A;   PEG PLACEMENT N/A 05/03/2020   Procedure: PERCUTANEOUS ENDOSCOPIC GASTROSTOMY (PEG) PLACEMENT;  Surgeon: Aviva Signs, MD;  Location: [facility];  Service: General;  Laterality: N/A;   UMBILICAL HERNIA REPAIR     Approx 10 years ago/lwb   ureteral stone extraction     Social History:  reports  that he quit smoking about 62 years ago. His smoking use included pipe. He has never used smokeless tobacco. He reports current alcohol use. He reports that he does not use drugs.  Allergies  Allergen Reactions   Robaxin [Methocarbamol] Other (See Comments)    Mental confusion    Family History  Problem Relation Age of Onset   Coronary artery disease Other        no family history of premature CAD    Prior to Admission medications   Medication Sig Start Date End Date Taking? Authorizing Provider  acetaminophen (TYLENOL) 160 MG/5ML elixir Place 325 mg into feeding tube every 6 (six) hours as needed for fever. Give 650 mg Patient not taking: Reported on 10/06/2020    [provider]  aspirin EC 81 MG tablet Take 81 mg by mouth daily. Swallow whole.    [provider]  azithromycin (ZITHROMAX) 250 MG tablet Take 1 tablet (250 mg total) by mouth daily. Patient not taking: Reported on 10/06/2020 08/21/20   Veryl Speak, MD  benzonatate (TESSALON) 100 MG capsule Take 1 capsule (100 mg total) by mouth every 8 (eight) hours. 08/21/20   Veryl Speak, MD  Cholecalciferol (VITAMIN D) 125 MCG (5000 UT) CAPS 5,000 Units by PEG Tube route daily in the afternoon. 0900    [provider]  cloNIDine (CATAPRES - DOSED IN MG/24 HR) 0.1 mg/24hr patch Place 1 patch (0.1 mg total) onto the skin once a week. Place on Fridays 05/24/20   Gerlene Fee, NP  famotidine (PEPCID) 40 MG/5ML suspension Take 2.5 mLs (20 mg total) by mouth at bedtime. 06/08/20   Tanda Rockers, MD  fluorometholone (FML) 0.1 % ophthalmic suspension Place 1 drop into both eyes 2 (two) times daily. 0900 & 2100 05/24/20   Gerlene Fee, NP  guaiFENesin (MUCINEX PO) Take by mouth. Mucinex two times a day    [provider]  Multiple Vitamin (MULTIVITAMIN) tablet Take 1 tablet by mouth daily.    [provider]  polyethylene glycol (MIRALAX / GLYCOLAX) 17 g packet Place 17 g into feeding tube daily  as needed. Patient not taking: Reported on 10/06/2020    [provider]  Probiotic Product (PROBIOTIC DAILY PO) Take by mouth. One daily    [provider]  rosuvastatin (CRESTOR) 20 MG tablet Place 1 tablet (20 mg total) into feeding tube at bedtime. 2100 Patient not taking: Reported on 10/06/2020 05/24/20   Gerlene Fee, NP    Physical Exam: Vitals:   08/26/21 1820 08/26/21 1830 08/26/21 1900 08/26/21 1930  BP:  133/88 (!) 145/72 (!) 156/86  Pulse: 99 99 90 97  Resp: (!) 40 (!) 38 (!) 31 (!) 41  Temp:    98.1 F (36.7 C)  TempSrc:    Oral  SpO2: 100% 100% 100% 100%  Weight:      Height:       1.  General: Patient lying supine in bed,  ill appearing    2.  Psychiatric: Alert and oriented, mood and behavior normal for situation, pleasant and cooperative with exam   3. Neurologic: Speech and language are normal, face is symmetric, no new asymmetric weakness, at baseline without acute deficits on limited exam   4. HEENMT:  Head is atraumatic, normocephalic, pupils reactive to light, neck is supple, trachea is midline, mucous membranes are moist   5. Respiratory : Upper airway rattling, crackles at the bases, mild wheezes on the right, no rhonchi,  no cyanosis, significant increase in work of breathing - currently refusing BiPAP   6. Cardiovascular : Heart rate normal, rhythm is regular, no murmurs, rubs or gallops, peripheral edema up to lower abdomen, peripheral pulses palpated   7. Gastrointestinal:  Abdomen is soft, mildly distended, nontender to palpation bowel sounds active, no masses or organomegaly palpated   8. Skin:  Erythema and edema to the left lower leg   9.Musculoskeletal:  No acute deformities or trauma, no asymmetry in tone, peripheral pulses palpated, no tenderness to palpation in the extremities  Data Reviewed: Temp 98.1, heart rate 90-100, respiratory rate 26-40, blood pressure 133/72-174/89 No leukocytosis at 8.2, hemoglobin 13.2,  platelets 217 Chemistry reveals a hyponatremia and a hyperglycemia, he is still hyponatremic after correction BNP 873 [place] initially 62, and then 70 -Chest x-ray shows cardiomegaly, interstitial pulmonary edema EKG shows sinus rhythm, QTc 488, heart rate 94 Lasix 40 mg IV given x1 DuoNeb given -Foley placed Admission requested for CHF exacerbation  Assessment and Plan: * CHF (congestive heart failure) (HCC) -Cardiomegaly and pulm edema on cxr, dyspnea, crackles -no history of CHF -Echo in the AM -Strict intake and output -Daily weights -Free water flushes reduced to comply with fluid restrictions -Continue aspirin and crestor -Continue to monitor  GERD (gastroesophageal reflux disease) -Continue pepcid  Generalized weakness -deconditioning and chronic illness -pt eval and treat  Cellulitis -Left lower extremity erythema and edema -Started on doxy outpatient without improvement -start rocephin -no leukocytosis, check procalcitonin -Continue to monitor  Thyromegaly -US thyroid -TSH in the AM  Hyponatremia -Na 121, previously 137 -Hypervolemic hyponatremia -Fluid restrictions -diuresis -trend in the AM   Dysphagia, oropharyngeal -NPO  -continue tube feeds -dietician consult -at home he does 2 cartons of jevity at 8a, 12p, 4p, and one carton at 8p of jevity -He received 334m free water flushes with each feed  Essential hypertension, benign -Continue clonidine      Advance Care Planning:   Code Status: Full Code   Consults: palliative care  Family Communication: Daughter at bedside  Severity of Illness: The appropriate patient status for this patient is INPATIENT. Inpatient status is judged to be reasonable and necessary in order to provide the required intensity of service to ensure the patient's safety. The patient's presenting symptoms, physical exam findings, and initial radiographic and laboratory data in the context of their chronic comorbidities  is felt to place them at high risk for further clinical deterioration. Furthermore, it is not anticipated that the patient will be medically stable for discharge from the hospital within 2 midnights of admission.   * I certify that at the point of admission it is my clinical judgment that the patient will require inpatient hospital care spanning beyond 2 midnights from the point of admission due to high intensity of service, high risk for further deterioration and high frequency of surveillance required.*  Author: ARolla Plate DO 08/26/2021 9:04 PM  For on call review [email or web address]

## 2021-08-26 NOTE — Assessment & Plan Note (Addendum)
-  NPO  -PEG tube in place, continue tube feeds when ileus improved -dietician consult -at home he does 2 cartons of jevity at 8a, 12p, 4p, and one carton at 8p of jevity -He received free water flushes with each feed -continue osmolite 1.5 while inpatient

## 2021-08-27 ENCOUNTER — Other Ambulatory Visit (HOSPITAL_COMMUNITY): Payer: Medicare Other

## 2021-08-27 ENCOUNTER — Inpatient Hospital Stay (HOSPITAL_COMMUNITY): Payer: Medicare Other

## 2021-08-27 DIAGNOSIS — I5021 Acute systolic (congestive) heart failure: Secondary | ICD-10-CM | POA: Diagnosis not present

## 2021-08-27 DIAGNOSIS — I959 Hypotension, unspecified: Secondary | ICD-10-CM | POA: Diagnosis present

## 2021-08-27 DIAGNOSIS — R778 Other specified abnormalities of plasma proteins: Secondary | ICD-10-CM | POA: Diagnosis not present

## 2021-08-27 DIAGNOSIS — I509 Heart failure, unspecified: Secondary | ICD-10-CM | POA: Diagnosis not present

## 2021-08-27 DIAGNOSIS — L03116 Cellulitis of left lower limb: Secondary | ICD-10-CM | POA: Diagnosis not present

## 2021-08-27 DIAGNOSIS — R1312 Dysphagia, oropharyngeal phase: Secondary | ICD-10-CM | POA: Diagnosis not present

## 2021-08-27 DIAGNOSIS — K2100 Gastro-esophageal reflux disease with esophagitis, without bleeding: Secondary | ICD-10-CM

## 2021-08-27 LAB — TSH: TSH: 6.083 u[IU]/mL — ABNORMAL HIGH (ref 0.350–4.500)

## 2021-08-27 LAB — ECHOCARDIOGRAM COMPLETE
AR max vel: 1.84 cm2
AV Area VTI: 1.7 cm2
AV Area mean vel: 1.68 cm2
AV Mean grad: 4.7 mmHg
AV Peak grad: 9.6 mmHg
Ao pk vel: 1.55 m/s
Area-P 1/2: 2.43 cm2
Height: 77 in
MV VTI: 2.49 cm2
S' Lateral: 3.7 cm
Weight: 2649.05 oz

## 2021-08-27 LAB — CBC WITH DIFFERENTIAL/PLATELET
Abs Immature Granulocytes: 0.04 10*3/uL (ref 0.00–0.07)
Basophils Absolute: 0.1 10*3/uL (ref 0.0–0.1)
Basophils Relative: 1 %
Eosinophils Absolute: 0 10*3/uL (ref 0.0–0.5)
Eosinophils Relative: 1 %
HCT: 38.4 % — ABNORMAL LOW (ref 39.0–52.0)
Hemoglobin: 13.3 g/dL (ref 13.0–17.0)
Immature Granulocytes: 1 %
Lymphocytes Relative: 11 %
Lymphs Abs: 1 10*3/uL (ref 0.7–4.0)
MCH: 31.4 pg (ref 26.0–34.0)
MCHC: 34.6 g/dL (ref 30.0–36.0)
MCV: 90.8 fL (ref 80.0–100.0)
Monocytes Absolute: 1 10*3/uL (ref 0.1–1.0)
Monocytes Relative: 11 %
Neutro Abs: 6.8 10*3/uL (ref 1.7–7.7)
Neutrophils Relative %: 75 %
Platelets: 203 10*3/uL (ref 150–400)
RBC: 4.23 MIL/uL (ref 4.22–5.81)
RDW: 13.2 % (ref 11.5–15.5)
WBC: 8.9 10*3/uL (ref 4.0–10.5)
nRBC: 0 % (ref 0.0–0.2)

## 2021-08-27 LAB — MRSA NEXT GEN BY PCR, NASAL: MRSA by PCR Next Gen: NOT DETECTED

## 2021-08-27 LAB — GLUCOSE, CAPILLARY
Glucose-Capillary: 102 mg/dL — ABNORMAL HIGH (ref 70–99)
Glucose-Capillary: 100 mg/dL — ABNORMAL HIGH (ref 70–99)
Glucose-Capillary: 164 mg/dL — ABNORMAL HIGH (ref 70–99)
Glucose-Capillary: 122 mg/dL — ABNORMAL HIGH (ref 70–99)
Glucose-Capillary: 190 mg/dL — ABNORMAL HIGH (ref 70–99)

## 2021-08-27 LAB — T4, FREE: Free T4: 1.3 ng/dL — ABNORMAL HIGH (ref 0.61–1.12)

## 2021-08-27 LAB — MAGNESIUM: Magnesium: 2.2 mg/dL (ref 1.7–2.4)

## 2021-08-27 MED ORDER — MIDODRINE HCL 5 MG PO TABS
5.0000 mg | ORAL_TABLET | Freq: Three times a day (TID) | ORAL | Status: DC
  Administered 2021-08-27 – 2021-08-29 (×6): 5 mg via ORAL
  Filled 2021-08-27 (×7): qty 1

## 2021-08-27 MED ORDER — IPRATROPIUM-ALBUTEROL 0.5-2.5 (3) MG/3ML IN SOLN
3.0000 mL | Freq: Once | RESPIRATORY_TRACT | Status: AC
Start: 2021-08-27 — End: 2021-08-27
  Administered 2021-08-27: 3 mL via RESPIRATORY_TRACT
  Filled 2021-08-27: qty 3

## 2021-08-27 MED ORDER — ORAL CARE MOUTH RINSE
15.0000 mL | OROMUCOSAL | Status: DC | PRN
Start: 2021-08-27 — End: 2021-08-30

## 2021-08-27 MED ORDER — IPRATROPIUM-ALBUTEROL 0.5-2.5 (3) MG/3ML IN SOLN
3.0000 mL | Freq: Four times a day (QID) | RESPIRATORY_TRACT | Status: DC
  Administered 2021-08-28 – 2021-09-02 (×22): 3 mL via RESPIRATORY_TRACT
  Filled 2021-08-27 (×22): qty 3

## 2021-08-27 MED ORDER — ALBUMIN HUMAN 25 % IV SOLN
25.0000 g | Freq: Once | INTRAVENOUS | Status: AC
  Administered 2021-08-27: 25 g via INTRAVENOUS
  Filled 2021-08-27: qty 100

## 2021-08-27 MED ORDER — IPRATROPIUM BROMIDE 0.02 % IN SOLN
RESPIRATORY_TRACT | Status: AC
  Administered 2021-08-27: 0.5 mg
  Filled 2021-08-27: qty 2.5

## 2021-08-27 MED ORDER — SIMETHICONE 40 MG/0.6ML PO SUSP
40.0000 mg | Freq: Once | ORAL | Status: AC
  Administered 2021-08-27: 40 mg
  Filled 2021-08-27: qty 0.6

## 2021-08-27 NOTE — Progress Notes (Signed)
PROGRESS NOTE    Patient: Dominic Hodges                            PCP: Celene Squibb, MD                    DOB: [birth year]            DOA: 08/26/2021 VEL:[id number]             DOS: 08/27/2021, 11:29 AM   LOS: 1 day   Date of Service: The patient was seen and examined on 08/27/2021  Subjective:   The patient was seen and examined this morning. 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   Patient's daughter provides most of the history.  She reports that patient has been getting heavier over the last week.  Its become a problem because his caretakers can no longer lift him to transfer him to commode.  He is also been progressively more short of breath.  He was working harder to breathe today.  This is why he came into the ER.  They report patient has had rattling breath sounds for quite some time and they have been under the impression that small upper airway.  Patient has dysphagia and is NPO.  He has had a PEG tube chronically.  Today they had him evaluated by hospice nurse.  It was the hospice nurse that also advised him to come into the ER when she saw how edematous, dyspneic, and diaphoretic patient was.  They were told that he cannot be in hospice as he does not have a diagnosis that meets the criteria.   Daughter is interested in palliative care.  Patient has not complained of any chest pain or palpitations.  He has no further complaints at this time.   Continues on PEG tube feedings.  He takes 1.2 kcals Jevity.  He needs 2 cartons at 8 AM, noon, 4 and 1 carton at 8 PM.  He has 300 cc of free water flush with each  feeding.   ED: Temp 98.1, heart rate 90-100, respiratory rate 26-40, blood pressure 133/72-174/89 No leukocytosis at 8.2, hemoglobin 13.2, platelets 217 Chemistry reveals a hyponatremia and a hyperglycemia, he is still hyponatremic after correction BNP 873 Trope initially 62, and then 70 -Chest x-ray shows cardiomegaly, interstitial pulmonary edema EKG shows sinus rhythm, QTc 488, heart rate 94 Lasix 40 mg IV given x1 DuoNeb given -Foley placed Admission requested for CHF exacerbation      Assessment & Plan:   Principal Problem:   CHF (congestive heart failure) ([place]) Active Problems:   Hypotensive episode   Cellulitis   Essential hypertension, benign   Dysphagia, oropharyngeal   Hyponatremia   Thyromegaly   Generalized weakness   GERD (gastroesophageal reflux disease)   Elevated troponin     Assessment and Plan: * CHF (congestive heart failure) ([place]) - Presumed new onset CHF -systolic versus diastolic -Elevated BNP 258, lower extremity edema, shortness of breath Crackles  -Chest x-ray consistent with cardiomegaly, congestion -Cardiomegaly and pulm edema on cxr, dyspnea, crackles -no  history of CHF -Echo:  -Strict intake and output/daily weight BMP, ReDs clip reading  -Free water flushes reduced to comply with fluid restrictions -Continue aspirin and crestor -Continue to monitor closely  -We will obtain a cardiology consult for further evaluation recommendation  Hypotensive episode - Currently hypotensive-BP 87/31 -Discontinue clonidine patch -Starting midodrine 5 mg p.o. 3 times daily, replacing albumin -Monitoring closely -Unfortunately needs Lasix for volume overload shortness of breath hypoxia  Cellulitis -Left lower extremity erythema and edema -Was on doxycycline as an outpatient with no improvement   -On admission patient was started on Rocephin-we will continue -Pro-Cal less than 0.10, WBC 8.9, -Continue to monitor  Elevated troponin -  Demand ischemia in the setting of hypoxia and CHF exacerbation - I-troponin 62, 70, 99 >>  -Denies any chest pain -Cardiogram: As needed repeat EKG -Monitor on telemetry  GERD (gastroesophageal reflux disease) -Continue pepcid  Generalized weakness -deconditioning and chronic illness -pt eval and treat  Thyromegaly -US thyroid -TSH: 6.083 -Obtaining free T3/4  Hyponatremia -Na 121, (previously 137) >>> 128  -Hypervolemic hyponatremia -Fluid restrictions -diuresis -Trending  Dysphagia, oropharyngeal -NPO  -PEG tube in place, continue tube feeds -dietician consult -at home he does 2 cartons of jevity at 8a, 12p, 4p, and one carton at 8p of jevity -He received 360m free water flushes with each feed  Essential hypertension, benign - Heavily hypotensive, discontinue clonidine            ----------------------------------------------------------------------------------------------------------------------------------------------- Nutritional status:  The patient's BMI is: Body mass index is 19.63 kg/m. I agree with the assessment and plan as outlined ----------------------------------------------------------------------------------------------------------------------------------------  DVT prophylaxis:  heparin injection 5,000 Units Start: 08/26/21 2200 SCDs Start: 08/26/21 1955   Code Status:   Code Status: Full Code  Family Communication: No family member present at bedside- attempt will be made to update daily The above findings and plan of care has been discussed with patient (and family)  in detail,  they expressed understanding and agreement of above. -Advance care planning has been discussed.   Ethics: Continue discussion with family regarding CODE STATUS, pursuing palliative care/hospice care in near future  With multiple comorbidities and steep decline anticipating poor prognosis possible in-hospital death.    a:dmission status:   Status is:  Inpatient Remains inpatient appropriate because: Needing supplemental oxygen, aggressive diuresis , On IV diuretics, consultants evaluation    Procedures:   No admission procedures for hospital encounter.   Antimicrobials:  Anti-infectives (From admission, onward)    Start     Dose/Rate Route Frequency Ordered Stop   08/26/21 2100  cefTRIAXone (ROCEPHIN) 1 g in sodium chloride 0.9 % 100 mL IVPB        1 g 200 mL/hr over 30 Minutes Intravenous Every 24 hours 08/26/21 2052          Medication:   aspirin  81 mg Per Tube Daily   Chlorhexidine Gluconate Cloth  6 each Topical Daily   famotidine  20 mg Oral QHS   feeding supplement (JEVITY 1.2 CAL)  474 mL Per Tube Q4H   fluorometholone  1 drop Both Eyes BID   free water  200 mL Per Tube TID AC & HS   furosemide  40 mg Intravenous Q12H   heparin  5,000 Units Subcutaneous Q8H   midodrine  5 mg Oral TID WC   mouth rinse  15 mL Mouth Rinse 4 times per day    acetaminophen **OR** acetaminophen, albuterol, morphine injection, ondansetron **OR** ondansetron (ZOFRAN) IV, mouth rinse, mouth rinse,  oxyCODONE, polyethylene glycol   Objective:   Vitals:   08/27/21 0900 08/27/21 1000 08/27/21 1001 08/27/21 1100  BP: (!) 111/54 (!) 111/55 (!) 111/55 (!) 87/31  Pulse: 95 93 93 66  Resp: (!) 23 20 (!) 25 (!) 23  Temp:      TempSrc:      SpO2: 98% 98% 97% 99%  Weight:      Height:        Intake/Output Summary (Last 24 hours) at 08/27/2021 1129 Last data filed at 08/27/2021 0919 Gross per 24 hour  Intake 950 ml  Output 3350 ml  Net -2400 ml   Filed Weights   08/26/21 1732 08/27/21 0300  Weight: 75 kg 75.1 kg     Examination:   Physical Exam  Constitution: In respiratory distress, shortness of breath, awake alert cooperative a Down to 2 L of oxygen by nasal cannula Psychiatric:   Normal and stable mood and affect, cognition intact,   HEENT:        Normocephalic, PERRL, otherwise with in Normal limits  Chest:          Chest symmetric Cardio vascular:  S1/S2, RRR, No murmure, No Rubs or Gallops  pulmonary: Clear to auscultation bilaterally, respirations unlabored, negative wheezes / crackles Abdomen: Soft, non-tender, non-distended, bowel sounds,no masses, no organomegaly Muscular skeletal: Limited exam -severe global generalized weaknesses, Neuro: CNII-XII intact. , normal motor and sensation, reflexes intact  Extremities: No pitting edema lower extremities, +2 pulses  Skin: Dry, warm to touch, negative for any Rashes, No open wounds Wounds: per nursing documentation   ------------------------------------------------------------------------------------------------------------------------------------------    LABs:     Latest Ref Rng & Units 08/27/2021    5:33 AM 08/26/2021    5:47 PM 07/21/2020    3:53 PM  CBC  WBC 4.0 - 10.5 K/uL 8.9  8.2  8.2   Hemoglobin 13.0 - 17.0 g/dL 13.3  13.2  13.3   Hematocrit 39.0 - 52.0 % 38.4  38.0  39.6   Platelets 150 - 400 K/uL 203  217  205       Latest Ref Rng & Units 08/27/2021    5:33 AM 08/26/2021    5:47 PM 09/19/2020   12:04 PM  CMP  Glucose 70 - 99 mg/dL 96  228  122   BUN 8 - 23 mg/dL 33  36  40   Creatinine 0.61 - 1.24 mg/dL 0.94  0.95  1.11   Sodium 135 - 145 mmol/L 128  121  137   Potassium 3.5 - 5.1 mmol/L 3.7  4.5  5.1   Chloride 98 - 111 mmol/L 87  85  97   CO2 22 - 32 mmol/L '31  27  23   '$ Calcium 8.9 - 10.3 mg/dL 8.2  7.9  8.9   Total Protein 6.5 - 8.1 g/dL 6.2  6.7    Total Bilirubin 0.3 - 1.2 mg/dL 0.8  0.6    Alkaline Phos 38 - 126 U/L 67  74    AST 15 - 41 U/L 20  20    ALT 0 - 44 U/L 19  21         Micro Results Recent Results (from the past 240 hour(s))  MRSA Next Gen by PCR, Nasal     Status: None   Collection Time: 08/26/21  8:39 PM   Specimen: Nasal Mucosa; Nasal Swab  Result Value Ref Range Status   MRSA by PCR Next Gen NOT DETECTED NOT DETECTED Final  Comment: (NOTE) The GeneXpert MRSA Assay (FDA approved for NASAL  specimens only), is one component of a comprehensive MRSA colonization surveillance program. It is not intended to diagnose MRSA infection nor to guide or monitor treatment for MRSA infections. Test performance is not FDA approved in patients less than 39 years old. Performed at [facility], [address], [place], [place] [address]     Radiology Reports DG Chest [place] 1 View  Result Date: 08/26/2021 CLINICAL DATA:  Shortness of breath EXAM: PORTABLE CHEST 1 VIEW COMPARISON:  10/27/2020 FINDINGS: Cardiomegaly. Mild, diffuse bilateral interstitial pulmonary opacity. The visualized skeletal structures are unremarkable. IMPRESSION: Cardiomegaly with mild, diffuse bilateral interstitial pulmonary opacity, likely edema. No focal airspace opacity. Electronically Signed   By: Delanna Ahmadi M.D.   On: 08/26/2021 18:05    SIGNED: Deatra James, MD, FHM. [facility],  Pager (please use [email or web address] to page/text) Please use Epic Secure Chat for non-urgent communication (7AM-7PM)  If 7PM-7AM, please contact night-coverage [email or web address], 08/27/2021, 11:29 AM

## 2021-08-27 NOTE — Progress Notes (Signed)
   08/27/21 1427  ReDS Vest / Clip  Station Marker C  Ruler Value 34  ReDS Value Range < 36  ReDS Actual Value 32  Anatomical Comments Chair position in bed

## 2021-08-27 NOTE — TOC Progression Note (Signed)
  Transition of Care [facility]) Screening Note   Patient Details  Name: Dominic Hodges Date of Birth: [birth year]   Transition of Care [facility]) CM/SW Contact:    Boneta Lucks, RN Phone Number: 08/27/2021, 2:15 PM  TOC Consulted for Hospice. TOC waiting of MD to have discussion with family.   Transition of Care Department [facility]) has reviewed patient and no TOC needs have been identified at this time. We will continue to monitor patient advancement through interdisciplinary progression rounds. If new patient transition needs arise, please place a TOC consult.     Expected Discharge Plan and Services        Living arrangements for the past 2 months: [place]

## 2021-08-27 NOTE — Progress Notes (Signed)
*  PRELIMINARY RESULTS* Echocardiogram 2D Echocardiogram has been performed.  Dominic Hodges 08/27/2021, 8:52 AM

## 2021-08-27 NOTE — Assessment & Plan Note (Signed)
-  BP improving and stabilizing -Discontinue clonidine patch -Started midodrine 5 mg p.o. 3 times daily>>> BP stable, anticipating discontinuing midodrine today -Status post repleting  Albumin 25 g x 1 08/27/21 -Monitoring closely -Unfortunately needs Lasix for volume overload shortness of breath hypoxia

## 2021-08-27 NOTE — Hospital Course (Addendum)
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 male with medical history significant of acid reflux, BPH, hyperlipidemia, hypertension, presents ED with a chief complaint of peripheral edema, generalized weakness, and dyspnea.   Patient's daughter provides most of the history.  She reports that patient has been getting heavier over the last week.  Its become a problem because his caretakers can no longer lift him to transfer him to commode.  He is also been progressively more short of breath.  He was working harder to breathe today.  This is why he came into the ER.  They report patient has had rattling breath sounds for quite some time and they have been under the impression that small upper airway.  Patient has dysphagia and is NPO.  He has had a PEG tube chronically.  Today they had him evaluated by hospice nurse.  It was the hospice nurse that also advised him to come into the ER when she saw how edematous, dyspneic, and diaphoretic patient was.  They were told that he cannot be in hospice as he does not have a diagnosis that meets the criteria.   Daughter is interested in palliative care.  Patient has not complained of any chest pain or palpitations.  He has no further complaints at this time.   Continues on PEG tube feedings.  He takes 1.2 kcals Jevity.  He needs 2 cartons at 8 AM, noon, 4 and 1 carton at 8 PM.  He has 300 cc of free water flush with each feeding.   ED: Temp 98.1, heart rate 90-100, respiratory rate 26-40, blood pressure 133/72-174/89 No leukocytosis at 8.2, hemoglobin 13.2, platelets 217 Chemistry reveals a hyponatremia and a hyperglycemia, he is still hyponatremic after correction BNP 873 Trope initially 62, and then 70 -Chest x-ray shows cardiomegaly, interstitial pulmonary edema EKG shows sinus rhythm, QTc 488, heart rate 94 Lasix 40 mg IV given x1 DuoNeb given -Foley placed Admission requested for CHF exacerbation

## 2021-08-27 NOTE — Assessment & Plan Note (Addendum)
-   Demand ischemia in the setting of hypoxia and CHF exacerbation - I-troponin 62, 70, 99 -Denies any chest pain  - Echo: LVE 55-20%, indet diastolic dysfunction but severe LAE would suggest long standing elevated LA pressures   -Monitor on telemetry

## 2021-08-27 NOTE — Progress Notes (Signed)
Patient started on flutter and incentive. He struggles coughing up thick secretions. Can only do about 250 cc on incentive. Flutter is very weak.

## 2021-08-28 ENCOUNTER — Inpatient Hospital Stay (HOSPITAL_COMMUNITY): Payer: Medicare Other

## 2021-08-28 ENCOUNTER — Encounter (HOSPITAL_COMMUNITY): Payer: Self-pay | Admitting: Family Medicine

## 2021-08-28 DIAGNOSIS — I509 Heart failure, unspecified: Secondary | ICD-10-CM | POA: Diagnosis not present

## 2021-08-28 DIAGNOSIS — L03116 Cellulitis of left lower limb: Secondary | ICD-10-CM | POA: Diagnosis not present

## 2021-08-28 DIAGNOSIS — R1312 Dysphagia, oropharyngeal phase: Secondary | ICD-10-CM | POA: Diagnosis not present

## 2021-08-28 DIAGNOSIS — R778 Other specified abnormalities of plasma proteins: Secondary | ICD-10-CM | POA: Diagnosis not present

## 2021-08-28 DIAGNOSIS — I5041 Acute combined systolic (congestive) and diastolic (congestive) heart failure: Secondary | ICD-10-CM | POA: Diagnosis not present

## 2021-08-28 DIAGNOSIS — E871 Hypo-osmolality and hyponatremia: Secondary | ICD-10-CM | POA: Diagnosis not present

## 2021-08-28 DIAGNOSIS — Z7189 Other specified counseling: Secondary | ICD-10-CM

## 2021-08-28 DIAGNOSIS — Z515 Encounter for palliative care: Secondary | ICD-10-CM | POA: Diagnosis not present

## 2021-08-28 LAB — COMPREHENSIVE METABOLIC PANEL
ALT: 19 U/L (ref 0–44)
AST: 20 U/L (ref 15–41)
Albumin: 3.4 g/dL — ABNORMAL LOW (ref 3.5–5.0)
Alkaline Phosphatase: 67 U/L (ref 38–126)
Anion gap: 10 (ref 5–15)
BUN: 33 mg/dL — ABNORMAL HIGH (ref 8–23)
CO2: 31 mmol/L (ref 22–32)
Calcium: 8.2 mg/dL — ABNORMAL LOW (ref 8.9–10.3)
Chloride: 87 mmol/L — ABNORMAL LOW (ref 98–111)
Creatinine, Ser: 0.94 mg/dL (ref 0.61–1.24)
GFR, Estimated: >60 mL/min (ref >60)
Glucose, Bld: 96 mg/dL (ref 70–99)
Potassium: 3.7 mmol/L (ref 3.5–5.1)
Sodium: 128 mmol/L — ABNORMAL LOW (ref 135–145)
Total Bilirubin: 0.8 mg/dL (ref 0.3–1.2)
Total Protein: 6.2 g/dL — ABNORMAL LOW (ref 6.5–8.1)

## 2021-08-28 LAB — BASIC METABOLIC PANEL
Anion gap: 12 (ref 5–15)
BUN: 36 mg/dL — ABNORMAL HIGH (ref 8–23)
CO2: 33 mmol/L — ABNORMAL HIGH (ref 22–32)
Calcium: 8.3 mg/dL — ABNORMAL LOW (ref 8.9–10.3)
Chloride: 87 mmol/L — ABNORMAL LOW (ref 98–111)
Creatinine, Ser: 1.03 mg/dL (ref 0.61–1.24)
GFR, Estimated: >60 mL/min (ref >60)
Glucose, Bld: 106 mg/dL — ABNORMAL HIGH (ref 70–99)
Potassium: 3.5 mmol/L (ref 3.5–5.1)
Sodium: 132 mmol/L — ABNORMAL LOW (ref 135–145)

## 2021-08-28 LAB — GLUCOSE, CAPILLARY
Glucose-Capillary: 105 mg/dL — ABNORMAL HIGH (ref 70–99)
Glucose-Capillary: 106 mg/dL — ABNORMAL HIGH (ref 70–99)
Glucose-Capillary: 107 mg/dL — ABNORMAL HIGH (ref 70–99)
Glucose-Capillary: 108 mg/dL — ABNORMAL HIGH (ref 70–99)
Glucose-Capillary: 110 mg/dL — ABNORMAL HIGH (ref 70–99)
Glucose-Capillary: 127 mg/dL — ABNORMAL HIGH (ref 70–99)

## 2021-08-28 LAB — TROPONIN I (HIGH SENSITIVITY): Troponin I (High Sensitivity): 99 ng/L — ABNORMAL HIGH (ref <18)

## 2021-08-28 LAB — T3, FREE: T3, Free: 2.2 pg/mL (ref 2.0–4.4)

## 2021-08-28 LAB — BRAIN NATRIURETIC PEPTIDE: B Natriuretic Peptide: 435 pg/mL — ABNORMAL HIGH (ref 0.0–100.0)

## 2021-08-28 MED ORDER — POTASSIUM CHLORIDE CRYS ER 10 MEQ PO TBCR
10.0000 meq | EXTENDED_RELEASE_TABLET | Freq: Once | ORAL | Status: AC
  Administered 2021-08-28: 10 meq via ORAL
  Filled 2021-08-28: qty 1

## 2021-08-28 NOTE — Progress Notes (Signed)
Patient placed on BiPAP, after about 45 minutes he was requesting to come off. Asked if he would wear another hour before removing. Hopefully he will comply.

## 2021-08-28 NOTE — Consult Note (Signed)
Consultation Note Date: 08/28/2021   Patient Name: Dominic Hodges  DOB: [birth year]  MRN: [id number]  Age / Sex: 88 y.o., male  PCP: Celene Squibb, MD Referring Physician: Deatra James, MD  Reason for Consultation: Establishing goals of care  HPI/Patient Profile: 88 y.o. male  with past medical history of PEG placement April 2022, left hip March 2022, right hip 2016, HTN/HLD, BPH, GERD, admitted on 08/26/2021 with CHF, diuresing.   Clinical Assessment and Goals of Care: I have reviewed medical records including EPIC notes, labs and imaging, received report from RN, assessed the patient.  Dominic Hodges is lying quietly in bed.  He appears acutely/chronically ill and somewhat frail.  He is alert and oriented x3, making and mostly keeping eye contact.  I believe that he can make his basic needs known.  There is no family at bedside at this time.  We meet at bedside to discuss diagnosis prognosis, GOC, EOL wishes, disposition and options.  I introduced Palliative Medicine as specialized medical care for people living with serious illness. It focuses on providing relief from the symptoms and stress of a serious illness. The goal is to improve quality of life for both the patient and the family.  Somewhat limited interview today due to Dominic Hodges' acute health concerns.  We talked about the treatment plan including, but not limited to, cardiology consults and recommendations, selected lab, diuresis.  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  We talk about suspected ileus, holding tube feeding, assisting with mobility, time.  Dominic Hodges tells me that he would prefer to go home, but is agreeable to short-term rehab if necessary, [facility] first choice.  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  Dominic Hodges names his daughter, Dominic Hodges, as his healthcare surrogate.  Advanced directives, concepts specific  to code status, artifical feeding and hydration, and rehospitalization were considered and discussed.  Dominic Hodges completed MOST form in April of this year stating full scope/full code.  He tells me that his goals have not changed.  With permission, call to daughter/healthcare surrogate, Dominic Hodges.  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  She shares that over the last 2 to 3 weeks he has experienced a decrease in mobility.  We talk in detail about heart failure and the treatment plan, ileus and the treatment plan, disposition, short-term rehab possibility if qualified.  Hospice and Palliative Care services outpatient were explained and offered.  [name] states that Dominic Hodges had an evaluation by hospice of [facility] but was not qualified due to no admitting diagnosis for hospice.  We talk about outpatient palliative care services.  Discussed the importance of continued conversation with family and the medical providers regarding overall plan of care and treatment options, ensuring decisions are within the context of the patient's values and GOCs.  Questions and concerns were addressed.  The patient was encouraged to call with questions or concerns.  PMT will continue to support holistically.  Conference  with attending, cardiologist, registered dietitian, bedside nursing staff, transition of care team related to patient condition, needs, goals of care, disposition.   HCPOA NEXT OF KIN -Dominic Hodges names his daughter, Dominic Hodges, as his healthcare surrogate.    SUMMARY OF RECOMMENDATIONS   At this point full scope/full code Time for outcomes Short-term rehab if qualified Family meeting 8/22 at 10 AM at [place]: Full code -MOST form completed April 2023.  Dominic Hodges shares that his wishes remain the same.  Symptom Management:  Per hospitalist, no additional needs at this time.  Palliative  Prophylaxis:  Oral Care and Turn Reposition  Additional Recommendations (Limitations, Scope, Preferences): Full Scope Treatment  Psycho-social/Spiritual:  Desire for further Chaplaincy support:no Additional Recommendations: Caregiving  Support/Resources  Prognosis:  Unable to determine, based on outcomes.  6 months or less would not be surprising based on chronic illness burden, frailty.  Discharge Planning: To be determined, based on outcomes.  Agreeable to short-term rehab if qualified.      Primary Diagnoses: Present on Admission:  Essential hypertension, benign  Dysphagia, oropharyngeal  Hypotensive episode   I have reviewed the medical record, interviewed the patient and family, and examined the patient. The following aspects are pertinent.  Past Medical History:  Diagnosis Date   Acid reflux    mild-states if doesnt chew food completely he begins to cough- states has weakness in muscles due to ilieus- has been doing exreci this   Adynamic ileus ([place]) 12/2014   ? anesthesia related per pt   BPH (benign prostatic hyperplasia)    Complication of anesthesia    10/11/15- per pt "had [place]- in 11/16-related it to possibly anesthesia"   H/O seasonal allergies    Hyperlipidemia    Hypertension    Social History   Socioeconomic History   Marital status: Married    Spouse name: Not on file   Number of children: Not on file   Years of education: Not on file   Highest education level: Not on file  Occupational History   Not on file  Tobacco Use   Smoking status: Former    Types: Pipe    Quit date: 01/09/1959    Years since quitting: 62.6   Smokeless tobacco: Never   Tobacco comments:    smoked a pipe not sure how long  Vaping Use   Vaping Use: Never used  Substance and Sexual Activity   Alcohol use: Yes    Comment: occasional social drink/lwb   Drug use: No   Sexual activity: Not on file  Other Topics Concern   Not on file   Social History Narrative   Not on file   Social Determinants of Health   Financial Resource Strain: Not on file  Food Insecurity: Not on file  Transportation Needs: Not on file  Physical Activity: Not on file  Stress: Not on file  Social Connections: Not on file   Family History  Problem Relation Age of Onset   Coronary artery disease Other        no family history of premature CAD   Scheduled Meds:  aspirin  81 mg Per Tube Daily   Chlorhexidine Gluconate Cloth  6 each Topical Daily   famotidine  20 mg Oral QHS   fluorometholone  1 drop Both Eyes BID   free water  200 mL Per Tube TID AC & HS   furosemide  40 mg Intravenous Q12H   heparin  5,000 Units Subcutaneous Q8H   ipratropium-albuterol  3 mL Nebulization Q6H WA   midodrine  5 mg Oral TID WC   mouth rinse  15 mL Mouth Rinse 4 times per day   Continuous Infusions:  cefTRIAXone (ROCEPHIN)  IV Stopped (08/27/21 2225)   PRN Meds:.acetaminophen **OR** acetaminophen, albuterol, morphine injection, ondansetron **OR** ondansetron (ZOFRAN) IV, mouth rinse, mouth rinse, oxyCODONE, polyethylene glycol Medications Prior to Admission:  Prior to Admission medications   Medication Sig Start Date End Date Taking? Authorizing Provider  acetaminophen (TYLENOL) 160 MG/5ML elixir Place 325 mg into feeding tube every 6 (six) hours as needed for fever. Give 650 mg   Yes [provider]  aspirin EC 81 MG tablet Take 81 mg by mouth daily. Swallow whole.   Yes [provider]  cloNIDine (CATAPRES - DOSED IN MG/24 HR) 0.1 mg/24hr patch Place 1 patch (0.1 mg total) onto the skin once a week. Place on Fridays 05/24/20  Yes Gerlene Fee, NP  Probiotic Product (PROBIOTIC DAILY PO) Take by mouth. One daily   Yes [provider]  torsemide (DEMADEX) 10 MG tablet Take 10 mg by mouth daily. 04/11/21  Yes [provider]   Allergies  Allergen Reactions   Robaxin [Methocarbamol] Other (See Comments)    Mental  confusion   Review of Systems  Unable to perform ROS: Age    Physical Exam Vitals and nursing note reviewed.  Constitutional:      General: He is not in acute distress.    Appearance: He is ill-appearing.  HENT:     Mouth/Throat:     Mouth: Mucous membranes are moist.  Cardiovascular:     Rate and Rhythm: Normal rate.  Pulmonary:     Effort: Pulmonary effort is normal.     Comments: Wet, weak cough Musculoskeletal:     Right lower leg: No edema.     Left lower leg: No edema.  Skin:    General: Skin is warm and dry.     Findings: Ecchymosis present.     Comments: Multiple areas of open and healing small wounds  Neurological:     Mental Status: He is alert and oriented to person, place, and time.  Psychiatric:        Mood and Affect: Mood normal.        Behavior: Behavior normal.     Vital Signs: BP 132/64   Pulse 79   Temp 98 F (36.7 C) (Oral)   Resp (!) 30   Ht '6\' 2"'$  (1.88 m)   Wt 82.6 kg   SpO2 95%   BMI 23.38 kg/m  Pain Scale: 0-10   Pain Score: 0-No pain   SpO2: SpO2: 95 % O2 Device:SpO2: 95 % O2 Flow Rate: .O2 Flow Rate (L/min): 2 L/min  IO: Intake/output summary:  Intake/Output Summary (Last 24 hours) at 08/28/2021 1232 Last data filed at 08/28/2021 1040 Gross per 24 hour  Intake 971.43 ml  Output 3000 ml  Net -2028.57 ml    LBM: Last BM Date : 08/26/21 Baseline Weight: Weight: 75 kg Most recent weight: Weight: 82.6 kg     Palliative Assessment/Data:   Flowsheet Rows    Flowsheet Row Most Recent Value  Intake Tab   Referral Department Hospitalist  Unit at Time of Referral Intermediate Care Unit  Palliative Care Primary Diagnosis Cardiac  Date Notified 08/26/21  Palliative Care Type New Palliative care  Reason for referral Clarify Goals of Care  Date of Admission 08/26/21  Date first seen by Palliative Care 08/28/21  # of days Palliative referral response time 2 Day(s)  # of days IP prior to Palliative referral 0  Clinical  Assessment   Palliative Performance Scale Score 30%  Pain Max last 24 hours Not able to report  Pain Min Last 24 hours Not able to report  Dyspnea Max Last 24 Hours Not able to report  Dyspnea Min Last 24 hours Not able to report  Psychosocial & Spiritual Assessment   Palliative Care Outcomes        Time In: 0840 Time Out: 0955 Time Total: 75 minutes  Greater than 50%  of this time was spent counseling and coordinating care related to the above assessment and plan.  Signed by: Drue Novel, NP   Please contact Palliative Medicine Team phone at [phone] for questions and concerns.  For individual provider: See Shea Evans

## 2021-08-28 NOTE — Consult Note (Addendum)
Cardiology Consultation:   Patient ID: Dominic Hodges MRN: [id number]; DOB: February 23, 1927  Admit date: 08/26/2021 Date of Consult: 08/28/2021  PCP:  Celene Squibb, MD   [facility] Providers Cardiologist:  None        Patient Profile:   Dominic Hodges is a 88 y.o. male with a hx of HTN, dysphagia who is being seen 08/28/2021 for the evaluation of new CHF at the request of Dr. Roger Shelter.  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 Patient admitted with edema, increased weight gain and shortness of breath. Echo shows new LVEF 45-50% global HK, mildly reduced RV. BNP 873 now 435, Na 121, 128, 132. Has diuresed 3.5 L. He denies chest pain. Says he's short of breath just recently. Walks with walker, lives with wife. Still eats some food-soft diet. Hospice following at home.    Past Medical History:  Diagnosis Date   Acid reflux    mild-states if doesnt chew food completely he begins to cough- states has weakness in muscles due to ilieus- has been doing exreci this   Adynamic ileus ([place]) 12/2014   ? anesthesia related per pt   BPH (benign prostatic hyperplasia)    Complication of anesthesia    10/11/15- per pt "had [place]- in 11/16-related it to possibly anesthesia"   H/O seasonal allergies    Hyperlipidemia    Hypertension     Past Surgical History:  Procedure Laterality Date   APPENDECTOMY     approx 30 years ago/lwb   CATARACT EXTRACTION W/PHACO Left 09/11/2012   Procedure: CATARACT EXTRACTION PHACO AND INTRAOCULAR LENS PLACEMENT ([place]);  Surgeon: Tonny , MD;  Location: [facility];  Service: Ophthalmology;  Laterality: Left;  CDE: 19.52   ESOPHAGOGASTRODUODENOSCOPY (EGD) WITH PROPOFOL N/A 05/03/2020   Procedure: ESOPHAGOGASTRODUODENOSCOPY (EGD) WITH PROPOFOL;  Surgeon: Aviva Signs, MD;  Location: [facility];  Service: General;  Laterality: N/A;   HIP ARTHROPLASTY Right 12/06/2014    Procedure: PARTIAL RIGHT HIP REPLACEMENT;  Surgeon: Carole Civil, MD;  Location: [facility];  Service: Orthopedics;  Laterality: Right;   HIP ARTHROPLASTY Left 03/08/2020   Procedure: ARTHROPLASTY BIPOLAR HIP (HEMIARTHROPLASTY);  Surgeon: Mordecai Rasmussen, MD;  Location: [facility];  Service: Orthopedics;  Laterality: Left;   LUMBAR LAMINECTOMY/DECOMPRESSION MICRODISCECTOMY N/A 10/19/2015   Procedure: CENTRAL DECOMPRESSION LUMBAR LAMINECTOMY FOR SPINAL STENOSIS, L3,L4FORAMINOTOMY BILATERAL L3,L4 NERVE ROOT;  Surgeon: Latanya Maudlin, MD;  Location: [facility];  Service: Orthopedics;  Laterality: N/A;   PEG PLACEMENT N/A 05/03/2020   Procedure: PERCUTANEOUS ENDOSCOPIC GASTROSTOMY (PEG) PLACEMENT;  Surgeon: Aviva Signs, MD;  Location: [facility];  Service: General;  Laterality: N/A;   UMBILICAL HERNIA REPAIR     Approx 10 years ago/lwb   ureteral stone extraction       Home Medications:  Prior to Admission medications   Medication Sig Start Date End Date Taking? Authorizing Provider  acetaminophen (TYLENOL) 160 MG/5ML elixir Place 325 mg into feeding tube every 6 (six) hours as needed for fever. Give 650 mg   Yes [provider]  aspirin EC 81 MG tablet Take 81 mg by mouth daily. Swallow whole.   Yes [provider]  cloNIDine (CATAPRES - DOSED IN MG/24 HR) 0.1 mg/24hr patch Place 1 patch (0.1 mg total) onto the skin once a week. Place on Fridays 05/24/20  Yes Gerlene Fee, NP  Probiotic Product (PROBIOTIC DAILY PO) Take by  mouth. One daily   Yes [provider]  torsemide (DEMADEX) 10 MG tablet Take 10 mg by mouth daily. 04/11/21  Yes [provider]    Inpatient Medications: Scheduled Meds:  aspirin  81 mg Per Tube Daily   Chlorhexidine Gluconate Cloth  6 each Topical Daily   famotidine  20 mg Oral QHS   fluorometholone  1 drop Both Eyes BID   free water  200 mL Per Tube TID AC & HS   furosemide  40 mg Intravenous Q12H   heparin  5,000 Units Subcutaneous Q8H    ipratropium-albuterol  3 mL Nebulization Q6H WA   midodrine  5 mg Oral TID WC   mouth rinse  15 mL Mouth Rinse 4 times per day   Continuous Infusions:  cefTRIAXone (ROCEPHIN)  IV Stopped (08/27/21 2225)   PRN Meds: acetaminophen **OR** acetaminophen, albuterol, morphine injection, ondansetron **OR** ondansetron (ZOFRAN) IV, mouth rinse, mouth rinse, oxyCODONE, polyethylene glycol  Allergies:    Allergies  Allergen Reactions   Robaxin [Methocarbamol] Other (See Comments)    Mental confusion    Social History:   Social History   Socioeconomic History   Marital status: Married    Spouse name: Not on file   Number of children: Not on file   Years of education: Not on file   Highest education level: Not on file  Occupational History   Not on file  Tobacco Use   Smoking status: Former    Types: Pipe    Quit date: 01/09/1959    Years since quitting: 62.6   Smokeless tobacco: Never   Tobacco comments:    smoked a pipe not sure how long  Vaping Use   Vaping Use: Never used  Substance and Sexual Activity   Alcohol use: Yes    Comment: occasional social drink/lwb   Drug use: No   Sexual activity: Not on file  Other Topics Concern   Not on file  Social History Narrative   Not on file   Social Determinants of Health   Financial Resource Strain: Not on file  Food Insecurity: Not on file  Transportation Needs: Not on file  Physical Activity: Not on file  Stress: Not on file  Social Connections: Not on file  Intimate Partner Violence: Not on file    Family History:     Family History  Problem Relation Age of Onset   Coronary artery disease Other        no family history of premature CAD     ROS:  Please see the history of present illness.  Review of Systems  Reason unable to perform ROS: poor historian.  Constitutional: Negative.  HENT: Negative.    Cardiovascular: Negative.   Respiratory: Negative.    Endocrine: Negative.   Hematologic/Lymphatic: Negative.    Musculoskeletal: Negative.   Gastrointestinal: Negative.   Genitourinary: Negative.   Neurological: Negative.     All other ROS reviewed and negative.     Physical Exam/Data:   Vitals:   08/28/21 0600 08/28/21 0700 08/28/21 0800 08/28/21 0826  BP: (!) 129/57 (!) 125/58 (!) 141/64   Pulse: 78 71 71   Resp: (!) 34 (!) 29 (!) 27   Temp:      TempSrc:      SpO2: 95% 96% 97% 97%  Weight:      Height:        Intake/Output Summary (Last 24 hours) at 08/28/2021 0834 Last data filed at 08/28/2021 0654 Gross per 24 hour  Intake 2201.43 ml  Output 3375 ml  Net -1173.57 ml      08/28/2021    3:31 AM 08/27/2021    3:00 AM 08/26/2021    5:32 PM  Last 3 Weights  Weight (lbs) 182 lb 1.6 oz 165 lb 9.1 oz 165 lb 5.5 oz  Weight (kg) 82.6 kg 75.1 kg 75 kg     Body mass index is 23.38 kg/m.  General:  Thin, elderly, in no acute distress  HEENT: normal Neck: no JVD Vascular: No carotid bruits; Distal pulses 2+ bilaterally Cardiac:  normal S1, S2; RRR; no murmur   Lungs:  decreased breath sounds with upper resp wheezing, rhonchi and rales  Abd: soft, nontender, no hepatomegaly  Ext: no edema, cellulitis left LE improving Musculoskeletal:  No deformities, BUE and BLE strength normal and equal Skin: warm and dry  Neuro:  CNs 2-12 intact, no focal abnormalities noted Psych:  Normal affect   EKG:  The EKG was personally reviewed and demonstrates:  NSR poor R wave progression ant Telemetry:  Telemetry was personally reviewed and demonstrates:  NSR with PVC, couplets  Relevant CV Studies:    Laboratory Data:  High Sensitivity Troponin:   Recent Labs  Lab 08/26/21 1747 08/26/21 1944 08/27/21 0533  TROPONINIHS 62* 70* 99*     Chemistry Recent Labs  Lab 08/26/21 1747 08/27/21 0533 08/28/21 0437  NA 121* 128* 132*  K 4.5 3.7 3.5  CL 85* 87* 87*  CO2 27 31 33*  GLUCOSE 228* 96 106*  BUN 36* 33* 36*  CREATININE 0.95 0.94 1.03  CALCIUM 7.9* 8.2* 8.3*  MG  --  2.2  --    GFRNONAA >60 >60 >60  ANIONGAP '9 10 12    '$ Recent Labs  Lab 08/26/21 1747 08/27/21 0533  PROT 6.7 6.2*  ALBUMIN 3.7 3.4*  AST 20 20  ALT 21 19  ALKPHOS 74 67  BILITOT 0.6 0.8   Lipids No results for input(s): "CHOL", "TRIG", "HDL", "LABVLDL", "LDLCALC", "CHOLHDL" in the last 168 hours.  Hematology Recent Labs  Lab 08/26/21 1747 08/27/21 0533  WBC 8.2 8.9  RBC 4.20* 4.23  HGB 13.2 13.3  HCT 38.0* 38.4*  MCV 90.5 90.8  MCH 31.4 31.4  MCHC 34.7 34.6  RDW 13.3 13.2  PLT 217 203   Thyroid  Recent Labs  Lab 08/27/21 0533  TSH 6.083*  FREET4 1.30*    BNP Recent Labs  Lab 08/26/21 1747 08/28/21 0437  BNP 873.0* 435.0*    DDimer No results for input(s): "DDIMER" in the last 168 hours.   Radiology/Studies:  DG Abd 1 View  Result Date: 08/27/2021 CLINICAL DATA:  Abdominal distention EXAM: ABDOMEN - 1 VIEW COMPARISON:  None Available. FINDINGS: Gastrostomy is seen in upper abdomen. There is no significant small bowel dilation. Stomach is not distended. There is gaseous distention of right colon. There is no distention of left colon. Colon is interposed between liver and abdominal wall. Patchy densities are seen in the left lower lung field. There is previous arthroplasty in both hips. Degenerative changes are noted in lumbar spine. IMPRESSION: There is a moderate to marked gaseous distention of right colon measuring up to 10.9 cm in diameter. This may suggest ileus or partial colonic obstruction. There is no significant small bowel dilation. Increased markings in left lower lung field suggest scarring or atelectasis or pneumonia. Electronically Signed   By: Elmer Picker M.D.   On: 08/27/2021 21:04   ECHOCARDIOGRAM COMPLETE  Result Date: 08/27/2021  ECHOCARDIOGRAM REPORT   Patient Name:   Dominic Hodges Date of Exam: 08/27/2021 Medical Rec #:  [id number]              Height:       77.0 in Accession #:    [id number]             Weight:       165.6 lb Date of  Birth:  September 23, 1927              BSA:          2.064 m Patient Age:    72 years               BP:           94/46 mmHg Patient Gender: M                      HR:           83 bpm. Exam Location:  [facility] Procedure: 2D Echo, Cardiac Doppler and Color Doppler Indications:    CHF  History:        Patient has no prior history of Echocardiogram examinations.                 CHF; Risk Factors:Hypertension and Dyslipidemia.  Sonographer:    Wenda Low Referring Phys: [id number] ASIA B  [place]  Sonographer Comments: Suboptimal subcostal window. IMPRESSIONS  1. Left ventricular ejection fraction, by estimation, is 45 to 50%. The left ventricle has mildly decreased function. The left ventricle demonstrates global hypokinesis. There is moderate asymmetric left ventricular hypertrophy of the basal-septal segment. Left ventricular diastolic parameters are indeterminate.  2. Right ventricular systolic function is mildly reduced. The right ventricular size is mildly enlarged.  3. Left atrial size was severely dilated.  4. The mitral valve is normal in structure. Trivial mitral valve regurgitation. No evidence of mitral stenosis.  5. The aortic valve is tricuspid. Aortic valve regurgitation is trivial. Aortic valve sclerosis/calcification is present, without any evidence of aortic stenosis.  6. Aortic dilatation noted. There is mild dilatation of the ascending aorta, measuring 39 mm. FINDINGS  Left Ventricle: Left ventricular ejection fraction, by estimation, is 45 to 50%. The left ventricle has mildly decreased function. The left ventricle demonstrates global hypokinesis. The left ventricular internal cavity size was normal in size. There is  moderate asymmetric left ventricular hypertrophy of the basal-septal segment. Left ventricular diastolic parameters are indeterminate. Right Ventricle: The right ventricular size is mildly enlarged. Right vetricular wall thickness was not well visualized. Right ventricular systolic  function is mildly reduced. The tricuspid regurgitant velocity is 2.65 m/s, and with an assumed right atrial pressure of 8 mmHg, the estimated right ventricular systolic pressure is 34.1 mmHg. Left Atrium: Left atrial size was severely dilated. Right Atrium: Right atrial size was normal in size. Pericardium: There is no evidence of pericardial effusion. Mitral Valve: The mitral valve is normal in structure. Trivial mitral valve regurgitation. No evidence of mitral valve stenosis. MV peak gradient, 2.6 mmHg. The mean mitral valve gradient is 1.0 mmHg. Tricuspid Valve: The tricuspid valve is normal in structure. Tricuspid valve regurgitation is trivial. Aortic Valve: The aortic valve is tricuspid. Aortic valve regurgitation is trivial. Aortic valve sclerosis/calcification is present, without any evidence of aortic stenosis. Aortic valve mean gradient measures 4.7 mmHg. Aortic valve peak gradient measures 9.6 mmHg. Aortic valve area, by VTI measures 1.70 cm. Pulmonic Valve: The pulmonic valve was  not well visualized. Pulmonic valve regurgitation is trivial. Aorta: The aortic root is normal in size and structure and aortic dilatation noted. There is mild dilatation of the ascending aorta, measuring 39 mm. IAS/Shunts: The interatrial septum was not well visualized.  LEFT VENTRICLE PLAX 2D LVIDd:         5.00 cm   Diastology LVIDs:         3.70 cm   LV e' lateral:   8.38 cm/s LV PW:         1.60 cm   LV E/e' lateral: 9.0 LV IVS:        1.15 cm LVOT diam:     2.00 cm LV SV:         52 LV SV Index:   25 LVOT Area:     3.14 cm  RIGHT VENTRICLE RV Basal diam:  4.40 cm RV Mid diam:    3.30 cm RV S prime:     11.60 cm/s LEFT ATRIUM              Index        RIGHT ATRIUM           Index LA diam:        5.30 cm  2.57 cm/m   RA Area:     21.40 cm LA Vol (A2C):   120.0 ml 58.13 ml/m  RA Volume:   59.90 ml  29.02 ml/m LA Vol (A4C):   89.7 ml  43.45 ml/m LA Biplane Vol: 108.0 ml 52.32 ml/m  AORTIC VALVE                      PULMONIC VALVE AV Area (Vmax):    1.84 cm      PV Vmax:       0.66 m/s AV Area (Vmean):   1.68 cm      PV Peak grad:  1.7 mmHg AV Area (VTI):     1.70 cm AV Vmax:           154.67 cm/s AV Vmean:          100.600 cm/s AV VTI:            0.302 m AV Peak Grad:      9.6 mmHg AV Mean Grad:      4.7 mmHg LVOT Vmax:         90.40 cm/s LVOT Vmean:        53.750 cm/s LVOT VTI:          0.164 m LVOT/AV VTI ratio: 0.54  AORTA Ao Root diam: 3.60 cm Ao Asc diam:  3.90 cm MITRAL VALVE               TRICUSPID VALVE MV Area (PHT): 2.43 cm    TR Peak grad:   28.1 mmHg MV Area VTI:   2.49 cm    TR Vmax:        265.00 cm/s MV Peak grad:  2.6 mmHg MV Mean grad:  1.0 mmHg    SHUNTS MV Vmax:       0.81 m/s    Systemic VTI:  0.16 m MV Vmean:      33.2 cm/s   Systemic Diam: 2.00 cm MV Decel Time: 312 msec MV E velocity: 75.40 cm/s MV A velocity: 33.00 cm/s MV E/A ratio:  2.28 Oswaldo Milian MD Electronically signed by Oswaldo Milian MD Signature Date/Time: 08/27/2021/2:07:41 PM    Final    DG Chest  Port 1 View  Result Date: 08/26/2021 CLINICAL DATA:  Shortness of breath EXAM: PORTABLE CHEST 1 VIEW COMPARISON:  10/27/2020 FINDINGS: Cardiomegaly. Mild, diffuse bilateral interstitial pulmonary opacity. The visualized skeletal structures are unremarkable. IMPRESSION: Cardiomegaly with mild, diffuse bilateral interstitial pulmonary opacity, likely edema. No focal airspace opacity. Electronically Signed   By: Delanna Ahmadi M.D.   On: 08/26/2021 18:05     Assessment and Plan:   Acute systolic CHF with edema, dyspnea, BNP 873, echo yest LVEF 45-50%, global HK, mildly reduced RV function, severely dilated LA, has diuresed 3.5L on lasix 40 mg IV bid. Hypotensive episode yest and clonidine stopped. BP better today. Consider stopping midodrine and see if ACEI/ARB could be added.  Dysphagia wiath PEG tube chronically  Hypotension yest-clonidine patch stopped and started on midodrine and replacing  albumin  HTN  Cellulitis on IV Rocephin  Hyponatremia Na 121-now 128     Risk Assessment/Risk Scores:        New York Heart Association (NYHA) Functional Class NYHA Class IV        For questions or updates, please contact [facility] Please consult [email or web address] for contact info under    Signed, Ermalinda Barrios, PA-C  08/28/2021 8:34 AM  Attending note Patient seen and discussed with PA Bonnell Public, I agree with her documentation. 88 yo male history of HL, HTN,  dysphagia with PEG, admitted with LE edema, dyspnea, weight gain, weakness. From notes recent home hospice evaluation, did not qualify and family considering palliative care.    WBC 8.2 Hgb 13.2 Plt 217 K 4.5 Na 121 BUN 36 Cr 0.95 BNP 873 Pro calc neg TSH 6 Free T4 1.3 Trop 62-->70-->99 CXR pulm edema Echo: LVE 02-63%, indet diastolic function, severe LAE, mild RV dysfuncntion    Acute combined systolic/diastolic HF - echo LVE 78-58%, indet diastolic dysfunction but severe LAE would suggest long standing elevated LA pressures - CXR pulm edema, BNP 873-->435  - neg 1.1 L yesterday, neg 3.5 L since admission. He is on IV lasix '40mg'$  bid, slight uptrend in Cr but still WNL. REDs vest value 32 however remains fluid overload by exam and symptomatic, continue IV diuresis - soft bp's at times, started on midodrine by primary team. Not able to start HF meds at this time. GIven advanced age and comorbidities limited benefits from HF meds in general and higher risk profile.    2. Hyponatremia - hypervolemic hyponatremia in setting of HF, Na improving with diuresis  Carlyle Dolly MD

## 2021-08-28 NOTE — Progress Notes (Addendum)
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   Patient's daughter provides most of the history.  She reports that patient has been getting heavier over the last week.  Its become a problem because his caretakers can no longer lift him to transfer him to commode.  He is also been progressively more short of breath.  He was working harder to breathe today.  This is why he came into the ER.  They report patient has had rattling breath sounds for quite some time and they have been under the impression that small upper airway.  Patient has dysphagia and is NPO.  He has had a PEG tube chronically.  Today they had him evaluated by hospice nurse.  It was the hospice nurse that also advised him to come into the ER when she saw how edematous, dyspneic, and diaphoretic patient was.  They were told that he cannot be in hospice as he does not have a diagnosis that meets the criteria.   Daughter is interested in palliative care.  Patient has not complained of any chest pain or palpitations.  He has no further complaints at this time.   Continues on PEG tube feedings.  He takes 1.2 kcals Jevity.  He needs 2 cartons at 8 AM, noon, 4 and 1 carton at 8 PM.  He has 300 cc of free water flush with each  feeding.   ED: Temp 98.1, heart rate 90-100, respiratory rate 26-40, blood pressure 133/72-174/89 No leukocytosis at 8.2, hemoglobin 13.2, platelets 217 Chemistry reveals a hyponatremia and a hyperglycemia, he is still hyponatremic after correction BNP 873 Trope initially 62, and then 70 -Chest x-ray shows cardiomegaly, interstitial pulmonary edema EKG shows sinus rhythm, QTc 488, heart rate 94 Lasix 40 mg IV given x1 DuoNeb given -Foley placed Admission requested for CHF exacerbation      Assessment & Plan:   Principal Problem:   CHF (congestive heart failure) ([place]) Active Problems:   Hypotensive episode   Cellulitis   Essential hypertension, benign   Dysphagia, oropharyngeal   Hyponatremia   Thyromegaly   Generalized weakness   GERD (gastroesophageal reflux disease)   Elevated troponin     Assessment and Plan: * CHF (congestive heart failure) ([place]) -Per cardiology combined systolic/diastolic CHF  -Elevated BNP 873, >>> 435 - lower extremity edema,  - shortness of breath with Crackles  -Chest x-ray consistent with cardiomegaly, congestion -Cardiomegaly and  pulm edema on cxr, dyspnea, crackles -No prior history of CHF -Echo: Reviewed ejection fraction 93-81%, systolic/diastolic dysfunction,  -Strict intake and output 1.1 L, (total of 3.5 L since admission) - Daily weight   -  ReDs clip reading >> 32  -On 40 mg of Lasix twice daily,  -Free water flushes reduced to comply with fluid restrictions -Continue aspirin and crestor   -Cardiology consulted, appreciate input  Hypotensive episode - Currently hypotensive-BP 87/31 -Discontinue clonidine patch -Starting midodrine 5 mg p.o. 3 times daily, -Status post repleting  Albumin 25 g x 1 08/27/21 -Monitoring closely -Unfortunately needs Lasix for volume overload shortness of breath hypoxia  Cellulitis No significant changes, monitoring -Left lower extremity erythema and edema -Was on doxycycline as an  outpatient with no improvement   -On admission patient was started on Rocephin-we will continue -Pro-Cal less than 0.10, WBC 8.9,   Elevated troponin - Demand ischemia in the setting of hypoxia and CHF exacerbation - I-troponin 62, 70, 99 >>  -Denies any chest pain -Cardiogram: As needed repeat EKG -Monitor on telemetry  GERD (gastroesophageal reflux disease) -Continue pepcid  Generalized weakness -Severe global weakness, with severe debility -Ambulates with assist only -deconditioning and chronic illness -pt eval and treat  Thyromegaly -US thyroid -TSH: 6.083 -Obtaining free T4 >> 1.30  Hyponatremia -Na 121, (previously 137) >>> 128 >>132 -Hypervolemic hyponatremia -Fluid restrictions -diuresis -Trending  Dysphagia, oropharyngeal -NPO  -PEG tube in place, continue tube feeds -dietician consult -at home he does 2 cartons of jevity at 8a, 12p, 4p, and one carton at 8p of jevity -He received 360m free water flushes with each feed  Essential hypertension, benign - Heavily hypotensive, discontinue clonidine -On midodrine            ----------------------------------------------------------------------------------------------------------------------------------------------- Nutritional status:  The patient's BMI is: Body mass index is 23.38 kg/m. I agree with the assessment and plan as outlined ----------------------------------------------------------------------------------------------------------------------------------------  DVT prophylaxis:  heparin injection 5,000 Units Start: 08/26/21 2200 SCDs Start: 08/26/21 1955   Code Status:   Code Status: Full Code  Family Communication: No family member present at bedside- attempt will be made to update daily The above findings and plan of care has been discussed with patient (and family)  in detail,  they expressed understanding and agreement of above. -Advance care planning has been discussed.    Ethics: Continue discussion with family regarding CODE STATUS, pursuing palliative care/hospice care in near future  With multiple comorbidities and steep decline anticipating poor prognosis possible in-hospital death.    a:dmission status:   Status is: Inpatient Remains inpatient appropriate because: Needing supplemental oxygen, aggressive diuresis , On IV diuretics, consultants evaluation    Procedures:   No admission procedures for hospital encounter.   Antimicrobials:  Anti-infectives (From admission, onward)    Start     Dose/Rate Route Frequency Ordered Stop   08/26/21 2100  cefTRIAXone (ROCEPHIN) 1 g in sodium chloride 0.9 % 100 mL IVPB        1 g 200 mL/hr over 30 Minutes Intravenous Every 24 hours 08/26/21 2052          Medication:   aspirin  81 mg Per Tube Daily   Chlorhexidine Gluconate Cloth  6 each Topical Daily   famotidine  20 mg Oral QHS   fluorometholone  1 drop Both Eyes BID   free water  200 mL Per Tube TID AC & HS   furosemide  40 mg Intravenous Q12H   heparin  5,000 Units Subcutaneous Q8H  ipratropium-albuterol  3 mL Nebulization Q6H WA   midodrine  5 mg Oral TID WC   mouth rinse  15 mL Mouth Rinse 4 times per day    acetaminophen **OR** acetaminophen, albuterol, morphine injection, ondansetron **OR** ondansetron (ZOFRAN) IV, mouth rinse, mouth rinse, oxyCODONE, polyethylene glycol   Objective:   Vitals:   08/28/21 0826 08/28/21 0900 08/28/21 1000 08/28/21 1113  BP:  (!) 132/52 132/64   Pulse:  73 79   Resp:  (!) 27 (!) 30   Temp:    98 F (36.7 C)  TempSrc:    Oral  SpO2: 97% 95% 95%   Weight:      Height:        Intake/Output Summary (Last 24 hours) at 08/28/2021 1113 Last data filed at 08/28/2021 1040 Gross per 24 hour  Intake 1631.43 ml  Output 3650 ml  Net -2018.57 ml   Filed Weights   08/26/21 1732 08/27/21 0300 08/28/21 0331  Weight: 75 kg 75.1 kg 82.6 kg     Examination:     General:  AAO x 2,  cooperative, no  distress; on 2 L of oxygen satting 92%  HEENT:  Normocephalic, PERRL, otherwise with in Normal limits   Neuro:  CNII-XII intact. , normal motor and sensation, reflexes intact   Lungs:   Labored breathing with scattered rhonchi and congestion,  moderate crackles at lower lobes  Cardio:    S1/S2, RRR, No murmure, No Rubs or Gallops   Abdomen:  Soft, non-tender, bowel sounds active all four quadrants, no guarding or peritoneal signs.  Muscular  skeletal:  Limited exam -global generalized weaknesses - in bed, able to move all 4 extremities,   2+ pulses,  symmetric, No pitting edema  Skin:  Dry, warm to touch, negative for any Rashes,  Wounds: Please see nursing documentation         ------------------------------------------------------------------------------------------------------------------------------------------    LABs:     Latest Ref Rng & Units 08/27/2021    5:33 AM 08/26/2021    5:47 PM 07/21/2020    3:53 PM  CBC  WBC 4.0 - 10.5 K/uL 8.9  8.2  8.2   Hemoglobin 13.0 - 17.0 g/dL 13.3  13.2  13.3   Hematocrit 39.0 - 52.0 % 38.4  38.0  39.6   Platelets 150 - 400 K/uL 203  217  205       Latest Ref Rng & Units 08/28/2021    4:37 AM 08/27/2021    5:33 AM 08/26/2021    5:47 PM  CMP  Glucose 70 - 99 mg/dL 106  96  228   BUN 8 - 23 mg/dL 36  33  36   Creatinine 0.61 - 1.24 mg/dL 1.03  0.94  0.95   Sodium 135 - 145 mmol/L 132  128  121   Potassium 3.5 - 5.1 mmol/L 3.5  3.7  4.5   Chloride 98 - 111 mmol/L 87  87  85   CO2 22 - 32 mmol/L 33  31  27   Calcium 8.9 - 10.3 mg/dL 8.3  8.2  7.9   Total Protein 6.5 - 8.1 g/dL  6.2  6.7   Total Bilirubin 0.3 - 1.2 mg/dL  0.8  0.6   Alkaline Phos 38 - 126 U/L  67  74   AST 15 - 41 U/L  20  20   ALT 0 - 44 U/L  19  21        Micro Results Recent Results (from  the past 240 hour(s))  MRSA Next Gen by PCR, Nasal     Status: None   Collection Time: 08/26/21  8:39 PM   Specimen: Nasal Mucosa; Nasal Swab  Result Value Ref Range  Status   MRSA by PCR Next Gen NOT DETECTED NOT DETECTED Final    Comment: (NOTE) The GeneXpert MRSA Assay (FDA approved for NASAL specimens only), is one component of a comprehensive MRSA colonization surveillance program. It is not intended to diagnose MRSA infection nor to guide or monitor treatment for MRSA infections. Test performance is not FDA approved in patients less than 34 years old. Performed at [facility], [address], [place], [place] [address]     Radiology Reports DG CHEST PORT 1 VIEW  Result Date: 08/28/2021 CLINICAL DATA:  Cough, shortness of breath EXAM: PORTABLE CHEST 1 VIEW COMPARISON:  08/26/2021 FINDINGS: Stable cardiomediastinal contours. Aortic atherosclerosis. Low lung volumes. Mildly prominent interstitial markings, most pronounced in the left lung base, similar to prior. No effusion or pneumothorax. IMPRESSION: Stable chest radiograph with low lung volumes and persistent mildly prominent interstitial markings, most pronounced in the left lung base. Electronically Signed   By: Davina Poke D.O.   On: 08/28/2021 10:14   DG Abd 1 View  Result Date: 08/27/2021 CLINICAL DATA:  Abdominal distention EXAM: ABDOMEN - 1 VIEW COMPARISON:  None Available. FINDINGS: Gastrostomy is seen in upper abdomen. There is no significant small bowel dilation. Stomach is not distended. There is gaseous distention of right colon. There is no distention of left colon. Colon is interposed between liver and abdominal wall. Patchy densities are seen in the left lower lung field. There is previous arthroplasty in both hips. Degenerative changes are noted in lumbar spine. IMPRESSION: There is a moderate to marked gaseous distention of right colon measuring up to 10.9 cm in diameter. This may suggest ileus or partial colonic obstruction. There is no significant small bowel dilation. Increased markings in left lower lung field suggest scarring or atelectasis or pneumonia. Electronically Signed    By: Elmer Picker M.D.   On: 08/27/2021 21:04    SIGNED: Deatra James, MD, FHM. > 75 minutes of critical time was spent stabilizing the patient, reviewing all records labs plan of care discussing the patient with ICU' care team including cardiology and family members.   [facility],  Pager (please use [email or web address] to page/text) Please use Epic Secure Chat for non-urgent communication (7AM-7PM)  If 7PM-7AM, please contact night-coverage [email or web address], 08/28/2021, 11:13 AM

## 2021-08-28 NOTE — Progress Notes (Addendum)
Initial Nutrition Assessment  DOCUMENTATION CODES:      INTERVENTION:  - Discontinue Jevity 1.2    -When patient is cleared to resume tube feeding: Start-Osmolite 1.5 -(bolus feedings-per tube) 474 ml TID   Tube feeding regimen provides 2133 kcal, 90 gr grams of protein, and 1097 ml of H2O.    Free water 200 ml TID   NUTRITION DIAGNOSIS:   Inadequate oral intake related to inability to eat as evidenced by NPO status (chronic and PEG dependent)- TF currently held.    GOAL:  Patient will meet greater than or equal to 90% of their needs- if congruent with patient healthcare wishes.    MONITOR:  Labs, Skin, Weight trends, TF tolerance  REASON FOR ASSESSMENT:   Consult Enteral/tube feeding initiation and management  ASSESSMENT:  Patient is a 88 yo male with HTN, Dysphagia, PEG and presents with shortness of breath, BLE edema, new CHF.   Patient not currently receiving tube feeding- nurse reports concern for ileus.   NPO-chronically. PEG tube with Jevity 1.2 with  (1659 ml) daily and free water 200 ml- TID. Providing 1990 kcal, 92 gr protein and 1338 ml water from formula and 600 ml free water flushes.  When patient is cleared to resume tube feeding will change formula to Osmolite 1.5 to limit excess volume. Continue free water flushes.   Palliative in to see patient this morning.   Weight encounters: 10/06/20-wt 75.3 kg and current- 82.6 kg. Generalized edema. Nutrition needs calculated based on adjusted weight (78 kg).   Medications: Pepcid, lasix (40 mg BID). IV- rocephin.    Intake/Output Summary (Last 24 hours) at 08/28/2021 1108 Last data filed at 08/28/2021 1040 Gross per 24 hour  Intake 1631.43 ml  Output 3650 ml  Net -2018.57 ml        Latest Ref Rng & Units 08/28/2021    4:37 AM 08/27/2021    5:33 AM 08/26/2021    5:47 PM  BMP  Glucose 70 - 99 mg/dL 106  96  228   BUN 8 - 23 mg/dL 36  33  36   Creatinine 0.61 - 1.24 mg/dL 1.03  0.94  0.95   Sodium 135 -  145 mmol/L 132  128  121   Potassium 3.5 - 5.1 mmol/L 3.5  3.7  4.5   Chloride 98 - 111 mmol/L 87  87  85   CO2 22 - 32 mmol/L 33  31  27   Calcium 8.9 - 10.3 mg/dL 8.3  8.2  7.9       NUTRITION - FOCUSED PHYSICAL EXAM: Unable to complete Nutrition-Focused physical exam at this time.     Diet Order:   Diet Order             Diet NPO time specified  Diet effective now                   EDUCATION NEEDS:  Not appropriate for education at this time  Skin:  Skin Assessment: Reviewed RN Assessment (dry, flaky skin)  Last BM:  8/19 bloating per nursing assessment  Height:   Ht Readings from Last 1 Encounters:  08/27/21 '6\' 2"'$  (1.88 m)    Weight:   Wt Readings from Last 1 Encounters:  08/28/21 82.6 kg    Ideal Body Weight:   86 kg  BMI:  Body mass index is 23.38 kg/m.  Estimated Nutritional Needs:   Kcal:  1950- 2100  Protein:  90-95 gr  Fluid:  <  2 liters daily   Colman Cater MS,RD,CSG,LDN Contact: Shea Evans

## 2021-08-29 ENCOUNTER — Inpatient Hospital Stay (HOSPITAL_COMMUNITY): Payer: Medicare Other

## 2021-08-29 DIAGNOSIS — Z7189 Other specified counseling: Secondary | ICD-10-CM | POA: Diagnosis not present

## 2021-08-29 DIAGNOSIS — J9601 Acute respiratory failure with hypoxia: Secondary | ICD-10-CM | POA: Diagnosis present

## 2021-08-29 DIAGNOSIS — Z515 Encounter for palliative care: Secondary | ICD-10-CM | POA: Diagnosis not present

## 2021-08-29 DIAGNOSIS — I5041 Acute combined systolic (congestive) and diastolic (congestive) heart failure: Secondary | ICD-10-CM

## 2021-08-29 DIAGNOSIS — K567 Ileus, unspecified: Secondary | ICD-10-CM | POA: Diagnosis present

## 2021-08-29 LAB — GLUCOSE, CAPILLARY
Glucose-Capillary: 76 mg/dL (ref 70–99)
Glucose-Capillary: 79 mg/dL (ref 70–99)
Glucose-Capillary: 84 mg/dL (ref 70–99)
Glucose-Capillary: 85 mg/dL (ref 70–99)
Glucose-Capillary: 85 mg/dL (ref 70–99)
Glucose-Capillary: 90 mg/dL (ref 70–99)

## 2021-08-29 LAB — BASIC METABOLIC PANEL
Anion gap: 10 (ref 5–15)
BUN: 34 mg/dL — ABNORMAL HIGH (ref 8–23)
CO2: 36 mmol/L — ABNORMAL HIGH (ref 22–32)
Calcium: 8.5 mg/dL — ABNORMAL LOW (ref 8.9–10.3)
Chloride: 89 mmol/L — ABNORMAL LOW (ref 98–111)
Creatinine, Ser: 1.13 mg/dL (ref 0.61–1.24)
GFR, Estimated: >60 mL/min (ref >60)
Glucose, Bld: 88 mg/dL (ref 70–99)
Potassium: 3.4 mmol/L — ABNORMAL LOW (ref 3.5–5.1)
Sodium: 135 mmol/L (ref 135–145)

## 2021-08-29 LAB — BLOOD GAS, ARTERIAL
Acid-Base Excess: 16.5 mmol/L — ABNORMAL HIGH (ref 0.0–2.0)
Bicarbonate: 42.7 mmol/L — ABNORMAL HIGH (ref 20.0–28.0)
Drawn by: 41977
FIO2: 36 %
O2 Saturation: 98.2 %
Patient temperature: 37
pCO2 arterial: 56 mmHg — ABNORMAL HIGH (ref 32–48)
pH, Arterial: 7.49 — ABNORMAL HIGH (ref 7.35–7.45)
pO2, Arterial: 79 mmHg — ABNORMAL LOW (ref 83–108)

## 2021-08-29 LAB — BRAIN NATRIURETIC PEPTIDE: B Natriuretic Peptide: 652 pg/mL — ABNORMAL HIGH (ref 0.0–100.0)

## 2021-08-29 MED ORDER — POTASSIUM CHLORIDE CRYS ER 20 MEQ PO TBCR: 40.0000 meq | EXTENDED_RELEASE_TABLET | Freq: Once | ORAL | Status: DC

## 2021-08-29 MED ORDER — POTASSIUM CHLORIDE 20 MEQ PO PACK
40.0000 meq | PACK | Freq: Once | ORAL | Status: AC
  Administered 2021-08-29: 40 meq
  Filled 2021-08-29: qty 2

## 2021-08-29 MED ORDER — DAPAGLIFLOZIN PROPANEDIOL 10 MG PO TABS
10.0000 mg | ORAL_TABLET | Freq: Every day | ORAL | Status: DC
  Administered 2021-08-29: 10 mg via ORAL
  Filled 2021-08-29 (×2): qty 1

## 2021-08-29 MED ORDER — OSMOLITE 1.5 CAL PO LIQD
1000.0000 mL | ORAL | Status: DC
  Administered 2021-08-30 – 2021-09-01 (×2): 1000 mL

## 2021-08-29 MED ORDER — POTASSIUM CHLORIDE 20 MEQ PO PACK
40.0000 meq | PACK | Freq: Once | ORAL | Status: DC
Start: 2021-08-29 — End: 2021-08-29

## 2021-08-29 NOTE — Assessment & Plan Note (Addendum)
-   Patient acute respiratory failure due to CHF -Patient initially required BiPAP ABG    Component Value Date/Time   PHART 7.49 (H) 08/29/2021 0951   PCO2ART 56 (H) 08/29/2021 0951   PO2ART 79 (L) 08/29/2021 0951   HCO3 42.7 (H) 08/29/2021 0951   O2SAT 98.2 08/29/2021 0951   Now weaned to 2L>>remains stable

## 2021-08-29 NOTE — Progress Notes (Addendum)
PROGRESS NOTE    Patient: Dominic Hodges                            PCP: Celene Squibb, MD                    DOB: 19-Dec-1927            DOA: 08/26/2021 KGU:[id number]             DOS: 08/29/2021, 4:45 PM   LOS: 3 days   Date of Service: The patient was seen and examined on 08/29/2021  Subjective:   The patient was seen and examined at, has been on BiPAP overnight not documented exactly if patient desatted to the low Eliquis, difficulty breathing..  Patient was weaned off to O2 via nasal cannula, was satting 95-97% this morning.  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   Patient's daughter provides most of the history.  She reports that patient has been getting heavier over the last week.  Its become a problem because his caretakers can no longer lift him to transfer him to commode.  He is also been progressively more short of breath.  He was working harder to breathe today.  This is why he came into the ER.  They report patient has had rattling breath sounds for quite some time and they have been under the impression that small upper airway.  Patient has dysphagia and is NPO.  He has had a PEG tube chronically.  Today they had him evaluated by hospice nurse.  It was the hospice nurse that also advised him to come into the ER when she saw how edematous, dyspneic, and diaphoretic patient was.  They were told that he cannot be in hospice as he does not have a diagnosis that meets the criteria.   Daughter is interested in palliative care.  Patient has not complained of any chest pain or palpitations.  He has no further complaints at this time.   Continues on PEG tube feedings.  He  takes 1.2 kcals Jevity.  He needs 2 cartons at 8 AM, noon, 4 and 1 carton at 8 PM.  He has 300 cc of free water flush with each feeding.   ED: Temp 98.1, heart rate 90-100, respiratory rate 26-40, blood pressure 133/72-174/89 No leukocytosis at 8.2, hemoglobin 13.2, platelets 217 Chemistry reveals a hyponatremia and a hyperglycemia, he is still hyponatremic after correction BNP 873 Trope initially 62, and then 70 -Chest x-ray shows cardiomegaly, interstitial pulmonary edema EKG shows sinus rhythm, QTc 488, heart rate 94 Lasix 40 mg IV given x1 DuoNeb given -Foley placed Admission requested for CHF exacerbation      Assessment & Plan:   Principal Problem:   Acute respiratory failure with hypoxia ([facility]) Active Problems:   CHF (congestive heart failure) (HCC)   Hypotensive episode   Ileus (HCC)   Cellulitis   Essential hypertension, benign   Dysphagia, oropharyngeal   Hyponatremia   Thyromegaly   Generalized weakness   GERD (gastroesophageal reflux disease)   Elevated troponin     Assessment and Plan: * Acute respiratory failure with hypoxia ([place]) - Patient  acute respiratory failure was thought to be due to acute onset of systolic/diastolic congestive heart failure and exacerbation -Patient now needing BiPAP.Marland Kitchen ABG    Component Value Date/Time   PHART 7.49 (H) 08/29/2021 0951   PCO2ART 56 (H) 08/29/2021 0951   PO2ART 79 (L) 08/29/2021 0951   HCO3 42.7 (H) 08/29/2021 0951   O2SAT 98.2 08/29/2021 0951   -Patient was on BiPAP overnight, he requested to be taken off, -He has been weaned off to O2 via nasal cannula now on 2 L of oxygen, satting 95-98%  -Treating underlying cause of CHF exacerbation, volume overload -Continue Ensure bronchodilator treatment    Ileus (HCC) KUB: IMPRESSION: Mild improvement in diffuse colonic dilatation compatible with Ileus.  Still holding Tube feeds  Monitoring   Hypotensive episode -BP improving and stabilizing -Discontinue  clonidine patch -Started midodrine 5 mg p.o. 3 times daily>>> BP stable, anticipating discontinuing midodrine today -Status post repleting  Albumin 25 g x 1 08/27/21 -Monitoring closely -Unfortunately needs Lasix for volume overload shortness of breath hypoxia  CHF (congestive heart failure) (HCC) -Per cardiology combined systolic/diastolic CHF  -Elevated BNP 873, >>> 435 >>652 - lower extremity edema >>> improved  - shortness of breath with Crackles  -Chest x-ray consistent with cardiomegaly, congestion -Cardiomegaly and pulm edema on cxr, dyspnea, crackles -No prior history of CHF -Echo: Reviewed ejection fraction 44-81%, systolic/diastolic dysfunction,  -Strict intake and output 2.8 L, (total of 6.4 L since admission) - Daily weight   -  ReDs clip reading >> 32  -On 40 mg of Lasix twice daily,  Filed Weights   08/27/21 0300 08/28/21 0331 08/29/21 0336  Weight: 75.1 kg 82.6 kg 78.4 kg    -Free water flushes reduced to comply with fluid restrictions -Continue aspirin and crestor   -Cardiology consulted, appreciate input  Cellulitis -Expected much improved Left lower extremity edema erythema -Was on doxycycline as an outpatient with no improvement   -On admission patient was started on Rocephin - Continue -Pro-Cal less than 0.10, WBC 8.9,   Elevated troponin - Demand ischemia in the setting of hypoxia and CHF exacerbation - I-troponin 62, 70, 99 -Denies any chest pain  - Echo: LVE 85-63%, indet diastolic dysfunction but severe LAE would suggest long standing elevated LA pressures   -Monitor on telemetry  GERD (gastroesophageal reflux disease) -Continue pepcid  Generalized weakness -Severe global weakness, with severe debility -Ambulates with assist only -deconditioning and chronic illness -pt eval and treat  Thyromegaly -US thyroid -TSH: 6.083 -Obtaining free T4 >> 1.30  Hyponatremia -Na 121, (previously 137) >>> 128 >>132, 135  -Hypervolemic  hyponatremia -Trending  Dysphagia, oropharyngeal -NPO  -PEG tube in place, continue tube feeds -dietician consult -at home he does 2 cartons of jevity at 8a, 12p, 4p, and one carton at 8p of jevity -He received 337m free water flushes with each feed  Essential hypertension, benign -BP has much improved  - was Hypotensive, discontinue clonidine -On midodrine--- anticipating discontinuing today 08/29/2021 If BP stabilizes   Ethics: Remains full code after extensive discussion with family and palliative care.  ----------------------------------------------------------------------------------------------------------------------------------------------- Nutritional status:  The patient's BMI is: Body mass index is 22.19 kg/m. I agree with the assessment and plan as outlined ----------------------------------------------------------------------------------------------------------------------------------------  DVT prophylaxis:  heparin injection 5,000 Units Start: 08/26/21 2200 SCDs Start: 08/26/21 1955   Code Status:   Code Status: Full Code  Family Communication: Daughters and is still patient's son present at bedside updated 08/29/21 The above findings and plan of care has been discussed  with patient (and family)  in detail,  they expressed understanding and agreement of above. -Advance care planning has been discussed.... Remains full code  Ethics: Continue discussion with family regarding CODE STATUS, pursuing palliative care/hospice care in near future  With multiple comorbidities and steep decline anticipating poor prognosis.   Admission status:   Status is: Inpatient Remains inpatient appropriate because: Needing supplemental oxygen, aggressive diuresis , On IV diuretics, consultants evaluation  Disposition: Patient and family is in discussion providing specific for the patient to be discharged home as health versus SNF in the next   Procedures:   No admission  procedures for hospital encounter.   Antimicrobials:  Anti-infectives (From admission, onward)    Start     Dose/Rate Route Frequency Ordered Stop   08/26/21 2100  cefTRIAXone (ROCEPHIN) 1 g in sodium chloride 0.9 % 100 mL IVPB        1 g 200 mL/hr over 30 Minutes Intravenous Every 24 hours 08/26/21 2052          Medication:   aspirin  81 mg Per Tube Daily   Chlorhexidine Gluconate Cloth  6 each Topical Daily   dapagliflozin propanediol  10 mg Oral Daily   famotidine  20 mg Oral QHS   fluorometholone  1 drop Both Eyes BID   free water  200 mL Per Tube TID AC & HS   furosemide  40 mg Intravenous Q12H   heparin  5,000 Units Subcutaneous Q8H   ipratropium-albuterol  3 mL Nebulization Q6H WA   mouth rinse  15 mL Mouth Rinse 4 times per day    acetaminophen **OR** acetaminophen, albuterol, morphine injection, ondansetron **OR** ondansetron (ZOFRAN) IV, mouth rinse, mouth rinse, oxyCODONE, polyethylene glycol   Objective:   Vitals:   08/29/21 1242 08/29/21 1300 08/29/21 1400 08/29/21 1409  BP: (!) 147/52 (!) 135/52 (!) 140/56   Pulse: 66 67 66   Resp: (!) 28 (!) 22 (!) 27   Temp:      TempSrc:      SpO2: 98% 98% 98% 98%  Weight:      Height:        Intake/Output Summary (Last 24 hours) at 08/29/2021 1645 Last data filed at 08/29/2021 1409 Gross per 24 hour  Intake 300 ml  Output 3655 ml  Net -3355 ml   Filed Weights   08/27/21 0300 08/28/21 0331 08/29/21 0336  Weight: 75.1 kg 82.6 kg 78.4 kg     Examination:     General:  Cont. SOB, off BiPAP at this time. AAO x 3,  cooperative,    HEENT:  Normocephalic, PERRL, otherwise with in Normal limits   Neuro:  CNII-XII intact. , normal motor and sensation, reflexes intact   Lungs:   Clear to auscultation BL, Respirations unlabored,  No wheezes / crackles  Cardio:    S1/S2, RRR, No murmure, No Rubs or Gallops   Abdomen:  Soft, non-tender, bowel sounds active all four quadrants, no guarding or peritoneal signs.   Muscular  skeletal:  Limited exam -sever global generalized weaknesses - in bed, able to move all 4 extremities,   2+ pulses,  symmetric, +1 pitting edema  Skin:  Dry, warm to touch, negative for any Rashes, lower extremity erythema edema has improved  Wounds: Please see nursing documentation      ------------------------------------------------------------------------------------------------------------------------------------------    LABs:     Latest Ref Rng & Units 08/27/2021    5:33 AM 08/26/2021    5:47 PM 07/21/2020  3:53 PM  CBC  WBC 4.0 - 10.5 K/uL 8.9  8.2  8.2   Hemoglobin 13.0 - 17.0 g/dL 13.3  13.2  13.3   Hematocrit 39.0 - 52.0 % 38.4  38.0  39.6   Platelets 150 - 400 K/uL 203  217  205       Latest Ref Rng & Units 08/29/2021    5:13 AM 08/28/2021    4:37 AM 08/27/2021    5:33 AM  CMP  Glucose 70 - 99 mg/dL 88  106  96   BUN 8 - 23 mg/dL 34  36  33   Creatinine 0.61 - 1.24 mg/dL 1.13  1.03  0.94   Sodium 135 - 145 mmol/L 135  132  128   Potassium 3.5 - 5.1 mmol/L 3.4  3.5  3.7   Chloride 98 - 111 mmol/L 89  87  87   CO2 22 - 32 mmol/L 36  33  31   Calcium 8.9 - 10.3 mg/dL 8.5  8.3  8.2   Total Protein 6.5 - 8.1 g/dL   6.2   Total Bilirubin 0.3 - 1.2 mg/dL   0.8   Alkaline Phos 38 - 126 U/L   67   AST 15 - 41 U/L   20   ALT 0 - 44 U/L   19        Micro Results Recent Results (from the past 240 hour(s))  MRSA Next Gen by PCR, Nasal     Status: None   Collection Time: 08/26/21  8:39 PM   Specimen: Nasal Mucosa; Nasal Swab  Result Value Ref Range Status   MRSA by PCR Next Gen NOT DETECTED NOT DETECTED Final    Comment: (NOTE) The GeneXpert MRSA Assay (FDA approved for NASAL specimens only), is one component of a comprehensive MRSA colonization surveillance program. It is not intended to diagnose MRSA infection nor to guide or monitor treatment for MRSA infections. Test performance is not FDA approved in patients less than 44 years old. Performed at  [facility], [address], [place], [place] [address]     Radiology Reports DG Abd 1 View  Result Date: 08/29/2021 CLINICAL DATA:  Abdominal pain rule out ileus EXAM: ABDOMEN - 1 VIEW COMPARISON:  Abdomen 08/27/2021 FINDINGS: Colonic dilatation mildly improved.  No small bowel dilatation. Gastrostomy tube overlies the stomach unchanged. Lumbar scoliosis.  Bilateral hip replacement IMPRESSION: Mild improvement in diffuse colonic dilatation compatible with ileus. Electronically Signed   By: Franchot Gallo M.D.   On: 08/29/2021 14:59   DG CHEST PORT 1 VIEW  Result Date: 08/29/2021 CLINICAL DATA:  Shortness of breath EXAM: PORTABLE CHEST 1 VIEW COMPARISON:  Previous studies including the examination of 08/28/2021 FINDINGS: Transverse diameter of heart is increased. There are no signs of pulmonary edema. There is poor inspiration. Increased markings are seen in the lower lung fields, more so on the left side with no significant interval change. Lateral CP angles are clear. There is no pneumothorax. Degenerative changes are noted in both shoulders, more so on the right side. IMPRESSION: Patchy infiltrates are seen in both lower lung fields, more so on the left side suggesting atelectasis/pneumonia. Part of this finding may suggest underlying scarring. No significant interval changes are noted. Electronically Signed   By: Elmer Picker M.D.   On: 08/29/2021 09:33    SIGNED: Deatra James, MD, FHM. > 75 minutes of critical time was spent stabilizing the patient, reviewing all records labs plan of care  discussing the patient with ICU' care team including cardiology and family members.   [facility],  Pager (please use [email or web address] to page/text) Please use Epic Secure Chat for non-urgent communication (7AM-7PM)  If 7PM-7AM, please contact night-coverage [email or web address], 08/29/2021, 4:45 PM

## 2021-08-29 NOTE — Progress Notes (Signed)
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  He appears acutely/chronically ill and quite frail.  He is alert, oriented x3.  I believe that he is able to make his basic needs known.  His children are at bedside, [name], and [name].  We talk in detail about [name] acute health concerns including, but not limited to, heart failure and the treatment plan, cardiology consult and recommendations, medications such as Lasix and Farxiga, amount diuresed, kidney function, the difficulty with toileting while on diuretics.  We talk about electrical dysfunction, vascular dysfunction and mechanical dysfunction (weak pump).  We talk about relatively normal EF.  Dr. [name] has a more productive cough today.  We also talk about Dr. [name] is functional status.  Family states that they have seen a decline over the last 6 months, several months in particular.  They share that Dr. [name] was walking with a walker around Christmas, but now is only able to walk a few feet before resting.  He has caregivers at home, but they are expressing their inability to care for him with his increasingly limited mobility.  I asked Dr. [name] what gives him pleasure in life at this time.  He seems to consider my question, but provides no answer.  I shared that he has the right to say what this time looks like and feels like for himself.  Family had consult with hospice of [facility] for in-home care.  At this point, Dr. [name] does not have an admitting hospice diagnosis.  They are agreeable to outpatient palliative services.  We talked about the benefits of short-term rehab.  Dr. [name] states that his preference is to return home, but he seems to understand that he may need short-term rehab to return safely home.  I shared that it remains to be seen his ability to recover, and to what extent.  We talk about time for outcomes.  We talk about CODE STATUS.  Dr. [name] completed a MOST form in April of this  year requesting full scope/full code.  I ask if he has considered how long he would want to be on life support.  He tells me that he has not.  We talk about tracheotomy after about 2 weeks.  I asked Dr. [name] if he were to be unresponsive, and his children got different information, could then make a different choice.  He states affirmatively that his children can make a different choice if they have new information.  We also talk about preferred place of death.  Dr. [name] states that he has not considered these choices.  I share multiple times that these discussions are difficult, but are held out of respect for him.  Conference with attending, cardiology, bedside nursing staff, transition of care team related to patient condition, needs, goals of care, disposition.  Plan:   At this point continue full scope/full code.  Time for outcomes.  Would prefer to return home, but realizes he may need short-term rehab.  75 minutes, extended time Quinn Axe, NP Palliative Medicine Team  Team Phone [phone] Greater than 50% of this time was spent counseling and coordinating care related to the above assessment and plan.

## 2021-08-29 NOTE — Progress Notes (Signed)
Nurse notified MD of concerns about pt's distended abdomen, pt denies pain, abdomen soft; KUB ordered; MD states hold tube feeding until the morning since KUB showed colonic dilatation compatible to ileus.

## 2021-08-29 NOTE — Plan of Care (Signed)
  Problem: Acute Rehab PT Goals(only PT should resolve) Goal: Pt Will Go Supine/Side To Sit Outcome: Progressing Flowsheets (Taken 08/29/2021 1635) Pt will go Supine/Side to Sit:  with maximum assist  with moderate assist Goal: Patient Will Transfer Sit To/From Stand Outcome: Progressing Flowsheets (Taken 08/29/2021 1635) Patient will transfer sit to/from stand:  with maximum assist  with moderate assist Goal: Pt Will Transfer Bed To Chair/Chair To Bed Outcome: Progressing Flowsheets (Taken 08/29/2021 1635) Pt will Transfer Bed to Chair/Chair to Bed:  with max assist  with mod assist Goal: Pt Will Ambulate Outcome: Progressing Flowsheets (Taken 08/29/2021 1635) Pt will Ambulate:  10 feet  with rolling walker  with moderate assist

## 2021-08-29 NOTE — Progress Notes (Signed)
Patient did not want to go on Bipap for tonight, even after me explaining that it was helping him.  I did convince him to at least wear it for a couple of hours, and RN gave patient some morphine to help patient.

## 2021-08-29 NOTE — Assessment & Plan Note (Addendum)
4/22 KUB--Mild improvement in diffuse colonic dilatation compatible with Ileus. -abd remains mildly distended -8/24 CT abd/pelvis--Distended and markedly redundant sigmoid colon with no focal transition point, possible [facility] consult appreciated>>felt to be due to constipation>>started cathartics --restart enteral feeding 8/26--tolerating enteral feeding; having BMs, abd nondistended --discussed with Dr. Osie Bond to d/c --increase TF to 45 mL/hr and increase by 10 mL/hr every 12 hours for goal rate of 60 mL/hour

## 2021-08-29 NOTE — Progress Notes (Addendum)
Nutrition Follow up  DOCUMENTATION CODES:      INTERVENTION:  - Discontinue Jevity 1.2    -After KUB- if appropriate will  Start-Osmolite 1.5 @ 25 ml/hr advance 10 ml/hr q 12 hr to goal rate of 60 ml/hr per PEG.   Tube feeding regimen provides 2133 kcal, 90 gr grams of protein, and 1097 ml of H2O.    Free water 200 ml TID   NUTRITION DIAGNOSIS:   Inadequate oral intake related to inability to eat as evidenced by NPO status (chronic and PEG dependent)- TF currently held.    GOAL:  Patient will meet greater than or equal to 90% of their needs- if congruent with patient healthcare wishes.    MONITOR:  Labs, Skin, Weight trends, TF tolerance  REASON FOR ASSESSMENT:   Consult Enteral/tube feeding initiation and management  ASSESSMENT:  Patient is a 88 yo male with HTN, Dysphagia, PEG and presents with shortness of breath, BLE edema, new CHF.   Patient not currently receiving tube feeding- nurse reports concern for ileus.   NPO-chronically. PEG tube with Jevity 1.2 with  (1659 ml) daily and free water 200 ml- TID. Providing 1990 kcal, 92 gr protein and 1338 ml water from formula and 600 ml free water flushes.  8/22 Patient is cleared to resume tube feeding- will change formula to Osmolite 1.5 to limit excess volume. Continue free water flushes. Discussed patient with nursing. He has not had a BM since 8/19. Nursing is requesting KUB prior to starting TF. RD will follow for results of imaging.   Palliative in to see patient this morning. Patient desires full scope of care at this time.  Weight encounters: 10/06/20-wt 75.3 kg and current- 82.6 kg. Generalized edema. Nutrition needs calculated based on adjusted weight (78 kg).   Medications: Pepcid, lasix (40 mg BID). IV- rocephin.    Intake/Output Summary (Last 24 hours) at 08/29/2021 1400 Last data filed at 08/29/2021 1328 Gross per 24 hour  Intake 300 ml  Output 3505 ml  Net -3205 ml        Latest Ref Rng & Units  08/29/2021    5:13 AM 08/28/2021    4:37 AM 08/27/2021    5:33 AM  BMP  Glucose 70 - 99 mg/dL 88  106  96   BUN 8 - 23 mg/dL 34  36  33   Creatinine 0.61 - 1.24 mg/dL 1.13  1.03  0.94   Sodium 135 - 145 mmol/L 135  132  128   Potassium 3.5 - 5.1 mmol/L 3.4  3.5  3.7   Chloride 98 - 111 mmol/L 89  87  87   CO2 22 - 32 mmol/L 36  33  31   Calcium 8.9 - 10.3 mg/dL 8.5  8.3  8.2      Diet Order:   Diet Order             Diet NPO time specified  Diet effective now                   EDUCATION NEEDS:  Not appropriate for education at this time  Skin:  Skin Assessment: Reviewed RN Assessment (dry, flaky skin)  Last BM:  8/19 bloating per nursing assessment  Height:   Ht Readings from Last 1 Encounters:  08/27/21 '6\' 2"'$  (1.88 m)    Weight:   Wt Readings from Last 1 Encounters:  08/29/21 78.4 kg    Ideal Body Weight:   86 kg  BMI:  Body mass index is 22.19 kg/m.  Estimated Nutritional Needs:   Kcal:  1950- 2100  Protein:  90-95 gr  Fluid:  < 2 liters daily   Colman Cater MS,RD,CSG,LDN Contact: Shea Evans

## 2021-08-29 NOTE — Progress Notes (Signed)
Progress Note  Patient Name: Dominic Hodges Date of Encounter: 08/29/2021  [facility] Cardiologist: Carlyle Dolly, MD   Subjective   No acute events overnight  Inpatient Medications    Scheduled Meds:  aspirin  81 mg Per Tube Daily   Chlorhexidine Gluconate Cloth  6 each Topical Daily   famotidine  20 mg Oral QHS   fluorometholone  1 drop Both Eyes BID   free water  200 mL Per Tube TID AC & HS   furosemide  40 mg Intravenous Q12H   heparin  5,000 Units Subcutaneous Q8H   ipratropium-albuterol  3 mL Nebulization Q6H WA   midodrine  5 mg Oral TID WC   mouth rinse  15 mL Mouth Rinse 4 times per day   Continuous Infusions:  cefTRIAXone (ROCEPHIN)  IV Stopped (08/28/21 2120)   PRN Meds: acetaminophen **OR** acetaminophen, albuterol, morphine injection, ondansetron **OR** ondansetron (ZOFRAN) IV, mouth rinse, mouth rinse, oxyCODONE, polyethylene glycol   Vital Signs    Vitals:   08/29/21 0358 08/29/21 0400 08/29/21 0500 08/29/21 0600  BP: (!) 121/55 (!) 121/55 (!) 103/49 (!) 115/46  Pulse: (!) 55 (!) 58 (!) 55 (!) 58  Resp: 19 (!) '21 17 18  '$ Temp:      TempSrc:      SpO2: 99% 99% 98% 99%  Weight:      Height:        Intake/Output Summary (Last 24 hours) at 08/29/2021 0815 Last data filed at 08/29/2021 0600 Gross per 24 hour  Intake 180 ml  Output 3055 ml  Net -2875 ml      08/29/2021    3:36 AM 08/28/2021    3:31 AM 08/27/2021    3:00 AM  Last 3 Weights  Weight (lbs) 172 lb 13.5 oz 182 lb 1.6 oz 165 lb 9.1 oz  Weight (kg) 78.4 kg 82.6 kg 75.1 kg      Telemetry    SR and sinus brady - Personally Reviewed  ECG    N/a - Personally Reviewed  Physical Exam   GEN: No acute distress.   Neck: No JVD Cardiac: RRR, no murmurs, rubs, or gallops.  Respiratory: mild crackles bilateral bases GI: Soft, nontender, non-distended  MS: No edema; No deformity. Neuro:  Nonfocal  Psych: Normal affect   Labs    High Sensitivity Troponin:   Recent Labs   Lab 08/26/21 1747 08/26/21 1944 08/27/21 0533  TROPONINIHS 62* 70* 99*     Chemistry Recent Labs  Lab 08/26/21 1747 08/27/21 0533 08/28/21 0437 08/29/21 0513  NA 121* 128* 132* 135  K 4.5 3.7 3.5 3.4*  CL 85* 87* 87* 89*  CO2 27 31 33* 36*  GLUCOSE 228* 96 106* 88  BUN 36* 33* 36* 34*  CREATININE 0.95 0.94 1.03 1.13  CALCIUM 7.9* 8.2* 8.3* 8.5*  MG  --  2.2  --   --   PROT 6.7 6.2*  --   --   ALBUMIN 3.7 3.4*  --   --   AST 20 20  --   --   ALT 21 19  --   --   ALKPHOS 74 67  --   --   BILITOT 0.6 0.8  --   --   GFRNONAA >60 >60 >60 >60  ANIONGAP '9 10 12 10    '$ Lipids No results for input(s): "CHOL", "TRIG", "HDL", "LABVLDL", "LDLCALC", "CHOLHDL" in the last 168 hours.  Hematology Recent Labs  Lab 08/26/21 1747 08/27/21 0533  WBC 8.2 8.9  RBC 4.20* 4.23  HGB 13.2 13.3  HCT 38.0* 38.4*  MCV 90.5 90.8  MCH 31.4 31.4  MCHC 34.7 34.6  RDW 13.3 13.2  PLT 217 203   Thyroid  Recent Labs  Lab 08/27/21 0533  TSH 6.083*  FREET4 1.30*    BNP Recent Labs  Lab 08/26/21 1747 08/28/21 0437 08/29/21 0513  BNP 873.0* 435.0* 652.0*    DDimer No results for input(s): "DDIMER" in the last 168 hours.   Radiology    DG CHEST PORT 1 VIEW  Result Date: 08/28/2021 CLINICAL DATA:  Cough, shortness of breath EXAM: PORTABLE CHEST 1 VIEW COMPARISON:  08/26/2021 FINDINGS: Stable cardiomediastinal contours. Aortic atherosclerosis. Low lung volumes. Mildly prominent interstitial markings, most pronounced in the left lung base, similar to prior. No effusion or pneumothorax. IMPRESSION: Stable chest radiograph with low lung volumes and persistent mildly prominent interstitial markings, most pronounced in the left lung base. Electronically Signed   By: Davina Poke D.O.   On: 08/28/2021 10:14   DG Abd 1 View  Result Date: 08/27/2021 CLINICAL DATA:  Abdominal distention EXAM: ABDOMEN - 1 VIEW COMPARISON:  None Available. FINDINGS: Gastrostomy is seen in upper abdomen. There  is no significant small bowel dilation. Stomach is not distended. There is gaseous distention of right colon. There is no distention of left colon. Colon is interposed between liver and abdominal wall. Patchy densities are seen in the left lower lung field. There is previous arthroplasty in both hips. Degenerative changes are noted in lumbar spine. IMPRESSION: There is a moderate to marked gaseous distention of right colon measuring up to 10.9 cm in diameter. This may suggest ileus or partial colonic obstruction. There is no significant small bowel dilation. Increased markings in left lower lung field suggest scarring or atelectasis or pneumonia. Electronically Signed   By: Elmer Picker M.D.   On: 08/27/2021 21:04   ECHOCARDIOGRAM COMPLETE  Result Date: 08/27/2021    ECHOCARDIOGRAM REPORT   Patient Name:   DR. Brooke Bonito Date of Exam: 08/27/2021 Medical Rec #:  [id number]              Height:       77.0 in Accession #:    [id number]             Weight:       165.6 lb Date of Birth:  [birth year]              BSA:          2.064 m Patient Age:    88 years               BP:           94/46 mmHg Patient Gender: M                      HR:           83 bpm. Exam Location:  [facility] Procedure: 2D Echo, Cardiac Doppler and Color Doppler Indications:    CHF  History:        Patient has no prior history of Echocardiogram examinations.                 CHF; Risk Factors:Hypertension and Dyslipidemia.  Sonographer:    Wenda Low Referring Phys: [id number] ASIA B [place]  Sonographer Comments: Suboptimal subcostal window. IMPRESSIONS  1. Left ventricular ejection fraction, by estimation, is 45 to 50%. The left ventricle  has mildly decreased function. The left ventricle demonstrates global hypokinesis. There is moderate asymmetric left ventricular hypertrophy of the basal-septal segment. Left ventricular diastolic parameters are indeterminate.  2. Right ventricular systolic function is mildly reduced. The  right ventricular size is mildly enlarged.  3. Left atrial size was severely dilated.  4. The mitral valve is normal in structure. Trivial mitral valve regurgitation. No evidence of mitral stenosis.  5. The aortic valve is tricuspid. Aortic valve regurgitation is trivial. Aortic valve sclerosis/calcification is present, without any evidence of aortic stenosis.  6. Aortic dilatation noted. There is mild dilatation of the ascending aorta, measuring 39 mm. FINDINGS  Left Ventricle: Left ventricular ejection fraction, by estimation, is 45 to 50%. The left ventricle has mildly decreased function. The left ventricle demonstrates global hypokinesis. The left ventricular internal cavity size was normal in size. There is  moderate asymmetric left ventricular hypertrophy of the basal-septal segment. Left ventricular diastolic parameters are indeterminate. Right Ventricle: The right ventricular size is mildly enlarged. Right vetricular wall thickness was not well visualized. Right ventricular systolic function is mildly reduced. The tricuspid regurgitant velocity is 2.65 m/s, and with an assumed right atrial pressure of 8 mmHg, the estimated right ventricular systolic pressure is 95.0 mmHg. Left Atrium: Left atrial size was severely dilated. Right Atrium: Right atrial size was normal in size. Pericardium: There is no evidence of pericardial effusion. Mitral Valve: The mitral valve is normal in structure. Trivial mitral valve regurgitation. No evidence of mitral valve stenosis. MV peak gradient, 2.6 mmHg. The mean mitral valve gradient is 1.0 mmHg. Tricuspid Valve: The tricuspid valve is normal in structure. Tricuspid valve regurgitation is trivial. Aortic Valve: The aortic valve is tricuspid. Aortic valve regurgitation is trivial. Aortic valve sclerosis/calcification is present, without any evidence of aortic stenosis. Aortic valve mean gradient measures 4.7 mmHg. Aortic valve peak gradient measures 9.6 mmHg. Aortic valve  area, by VTI measures 1.70 cm. Pulmonic Valve: The pulmonic valve was not well visualized. Pulmonic valve regurgitation is trivial. Aorta: The aortic root is normal in size and structure and aortic dilatation noted. There is mild dilatation of the ascending aorta, measuring 39 mm. IAS/Shunts: The interatrial septum was not well visualized.  LEFT VENTRICLE PLAX 2D LVIDd:         5.00 cm   Diastology LVIDs:         3.70 cm   LV e' lateral:   8.38 cm/s LV PW:         1.60 cm   LV E/e' lateral: 9.0 LV IVS:        1.15 cm LVOT diam:     2.00 cm LV SV:         52 LV SV Index:   25 LVOT Area:     3.14 cm  RIGHT VENTRICLE RV Basal diam:  4.40 cm RV Mid diam:    3.30 cm RV S prime:     11.60 cm/s LEFT ATRIUM              Index        RIGHT ATRIUM           Index LA diam:        5.30 cm  2.57 cm/m   RA Area:     21.40 cm LA Vol (A2C):   120.0 ml 58.13 ml/m  RA Volume:   59.90 ml  29.02 ml/m LA Vol (A4C):   89.7 ml  43.45 ml/m LA Biplane Vol: 108.0 ml 52.[place]  ml/m  AORTIC VALVE                     PULMONIC VALVE AV Area (Vmax):    1.84 cm      PV Vmax:       0.66 m/s AV Area (Vmean):   1.68 cm      PV Peak grad:  1.7 mmHg AV Area (VTI):     1.70 cm AV Vmax:           154.67 cm/s AV Vmean:          100.600 cm/s AV VTI:            0.302 m AV Peak Grad:      9.6 mmHg AV Mean Grad:      4.7 mmHg LVOT Vmax:         90.40 cm/s LVOT Vmean:        53.750 cm/s LVOT VTI:          0.164 m LVOT/AV VTI ratio: 0.54  AORTA Ao Root diam: 3.60 cm Ao Asc diam:  3.90 cm MITRAL VALVE               TRICUSPID VALVE MV Area (PHT): 2.43 cm    TR Peak grad:   28.1 mmHg MV Area VTI:   2.49 cm    TR Vmax:        265.00 cm/s MV Peak grad:  2.6 mmHg MV Mean grad:  1.0 mmHg    SHUNTS MV Vmax:       0.81 m/s    Systemic VTI:  0.16 m MV Vmean:      33.2 cm/s   Systemic Diam: 2.00 cm MV Decel Time: 312 msec MV E velocity: 75.40 cm/s MV A velocity: 33.00 cm/s MV E/A ratio:  2.28 Oswaldo Milian MD Electronically signed by Oswaldo Milian  MD Signature Date/Time: 08/27/2021/2:07:41 PM    Final     Cardiac Studies    Patient Profile     Dominic Hodges is a 88 y.o. male with a hx of HTN, dysphagia who is being seen 08/28/2021 for the evaluation of new CHF at the request of Dr. Roger Shelter.  Assessment & Plan    Acute combined systolic/diastolic HF - echo LVE 99-24%, indet diastolic dysfunction but severe LAE would suggest long standing elevated LA pressures - CXR pulm edema, BNP 873   - neg 2.8 L yesterday, neg 6.4 L since admission. He is on IV lasix '40mg'$  bid, slight uptrend in Cr but still WNL. REDs vest value 32 however remained fluid overload by exam and remains symptomatic. COntinue IV diuretics today  - soft bp's at times, started on midodrine by primary team. Not able to start HF meds at this time. GIven advanced age and comorbidities limited benefits from HF meds in general and higher side effect risk profile. Would start farxiga '10mg'$  daily today, hold on any others at this time.      2. Hyponatremia - hypervolemic hyponatremia in setting of HF, Na improving with diuresis   Palliative care following, family meeting today.    For questions or updates, please contact [place] Please consult [email or web address] for contact info under        Signed, Carlyle Dolly, MD  08/29/2021, 8:15 AM

## 2021-08-29 NOTE — Evaluation (Signed)
Physical Therapy Evaluation Patient Details Name: Dominic Hodges MRN: [id number] DOB: [birth year] Today's Date: 08/29/2021  History of Present Illness  Gavinn G Celestino Ackerman is a 88 y.o. male with medical history significant of acid reflux, BPH, hyperlipidemia, hypertension, presents ED with a chief complaint of peripheral edema, generalized weakness, and dyspnea.  Patient's daughter provides most of the history.  She reports that patient has been getting heavier over the last week.  Its become a problem because his caretakers can no longer lift him to transfer him to commode.  He is also been progressively more short of breath.  He was working harder to breathe today.  This is why he came into the ER.  They report patient has had rattling breath sounds for quite some time and they have been under the impression that small upper airway.  Patient has dysphagia and is NPO.  He has had a PEG tube chronically.  Today they had him evaluated by hospice nurse.  It was the hospice nurse that also advised him to come into the ER when she saw how edematous, dyspneic, and diaphoretic patient was.  They were told that he cannot be in hospice as he does not have a diagnosis that meets the criteria.  Daughter is interested in palliative care.  Patient has not complained of any chest pain or palpitations.  He has no further complaints at this time.     Continues on PEG tube feedings.  He takes 1.2 kcals Jevity.  He needs 2 cartons at 8 AM, noon, 4 and 1 carton at 8 PM.  He has 300 cc of free water flush with each feeding.     Patient is not able to provide more history at this time as he is working hard to breathe and having difficulty understanding questions.     Clinical Impression  Patient lying in bed on therapist arrival.  Multiple members of the patient's family are in the room.  He is able to give a thumbs up to communicate but has difficulty speaking.  Patient is max A for rolling in bed to the left and right.   He is unable to sit up on EOB without max A of 2.  PT guides patient in AAROM ankle pumps and heel slides.  PT discussed with him that he may need to go to inpatient rehab for a few weeks to get stronger before returning home as he and his family want him to return home. Patient will benefit from skilled therapy services to address deficits and promote optimal function.        Recommendations for follow up therapy are one component of a multi-disciplinary discharge planning process, led by the attending physician.  Recommendations may be updated based on patient status, additional functional criteria and insurance authorization.  Follow Up Recommendations Skilled nursing-short term rehab (<3 hours/day) Can patient physically be transported by private vehicle: No    Assistance Recommended at Discharge Frequent or constant Supervision/Assistance  Patient can return home with the following  Two people to help with bathing/dressing/bathroom;Two people to help with walking and/or transfers;Help with stairs or ramp for entrance    Equipment Recommendations None recommended by PT  Recommendations for Other Services       Functional Status Assessment Patient has had a recent decline in their functional status and demonstrates the ability to make significant improvements in function in a reasonable and predictable amount of time.     Precautions / Restrictions Precautions Precautions: None  Restrictions Weight Bearing Restrictions: No      Mobility  Bed Mobility Overal bed mobility: Needs Assistance Bed Mobility: Rolling Rolling: Max assist         General bed mobility comments: max A to roll in bed    Transfers                        Ambulation/Gait                  Stairs            Wheelchair Mobility    Modified Rankin (Stroke Patients Only)       Balance                                             Pertinent Vitals/Pain  Pain Assessment Pain Assessment: No/denies pain    Home Living Family/patient expects to be discharged to:: Private residence Living Arrangements: Spouse/significant other Available Help at Discharge: Family;Personal care attendant;Available 24 hours/day Type of Home: House Home Access: Stairs to enter Entrance Stairs-Rails: Psychiatric nurse of Steps: 2   Home Layout: Two level;Full bath on main level;Able to live on main level with bedroom/bathroom Home Equipment: Wheelchair - Publishing copy (2 wheels);Rollator (4 wheels);Tub bench;Transport chair;Hospital bed;Cane - single point;BSC/3in1;Hand held shower head Additional Comments: Patient is a retired Radiation protection practitioner MD    Prior Function Prior Level of Function : Needs assist       Physical Assist : Mobility (physical);ADLs (physical) Mobility (physical): Transfers;Gait;Stairs ADLs (physical): Grooming;Bathing;Dressing;Toileting Mobility Comments: has a personal care attendent for he and his wife that comes to the home daily to assist with ADLs       Hand Dominance   Dominant Hand: Right    Extremity/Trunk Assessment   Upper Extremity Assessment Upper Extremity Assessment: Generalized weakness    Lower Extremity Assessment Lower Extremity Assessment: Generalized weakness       Communication   Communication: Expressive difficulties  Cognition Arousal/Alertness: Awake/alert Behavior During Therapy: Flat affect Overall Cognitive Status: Difficult to assess                                 General Comments: seems to understand questions; sometimes gives a thumbs up for yes but having difficulty speaking        General Comments General comments (skin integrity, edema, etc.): unable to assess sitting balance patient unable to sit up on EOB    Exercises Other Exercises Other Exercises: PT assists patient with performing ankle pumps; heel slides AAROM with limited motion    Assessment/Plan    PT Assessment Patient needs continued PT services  PT Problem List Decreased strength;Decreased range of motion;Decreased activity tolerance;Decreased balance;Decreased mobility       PT Treatment Interventions Neuromuscular re-education;Functional mobility training;Therapeutic activities;Therapeutic exercise;Patient/family education    PT Goals (Current goals can be found in the Care Plan section)  Acute Rehab PT Goals Patient Stated Goal: to return home PT Goal Formulation: With patient/family Time For Goal Achievement: 09/12/21 Potential to Achieve Goals: Fair    Frequency Min 2X/week     Co-evaluation               AM-PAC PT "6 Clicks" Mobility  Outcome Measure Help needed turning from your back to your side while in  a flat bed without using bedrails?: A Lot Help needed moving from lying on your back to sitting on the side of a flat bed without using bedrails?: Total Help needed moving to and from a bed to a chair (including a wheelchair)?: Total Help needed standing up from a chair using your arms (e.g., wheelchair or bedside chair)?: Total Help needed to walk in hospital room?: Total Help needed climbing 3-5 steps with a railing? : Total 6 Click Score: 7    End of Session Equipment Utilized During Treatment: Oxygen Activity Tolerance: Patient limited by fatigue Patient left: in bed;with call bell/phone within reach Nurse Communication: Mobility status PT Visit Diagnosis: Muscle weakness (generalized) (M62.81);Difficulty in walking, not elsewhere classified (R26.2);Other abnormalities of gait and mobility (R26.89)    Time: 1314-3888 PT Time Calculation (min) (ACUTE ONLY): 20 min   Charges:   PT Evaluation $PT Eval Low Complexity: 1 Low PT Treatments $Therapeutic Exercise: 8-22 mins        4:33 PM, 08/29/21 Azuri Bozard Small Cecia Egge MPT [facility] [place] [phone]

## 2021-08-30 ENCOUNTER — Other Ambulatory Visit: Payer: Self-pay

## 2021-08-30 DIAGNOSIS — Z515 Encounter for palliative care: Secondary | ICD-10-CM | POA: Diagnosis not present

## 2021-08-30 DIAGNOSIS — I5021 Acute systolic (congestive) heart failure: Secondary | ICD-10-CM | POA: Diagnosis not present

## 2021-08-30 DIAGNOSIS — R1312 Dysphagia, oropharyngeal phase: Secondary | ICD-10-CM | POA: Diagnosis not present

## 2021-08-30 DIAGNOSIS — Z7189 Other specified counseling: Secondary | ICD-10-CM | POA: Diagnosis not present

## 2021-08-30 DIAGNOSIS — J9601 Acute respiratory failure with hypoxia: Secondary | ICD-10-CM | POA: Diagnosis not present

## 2021-08-30 DIAGNOSIS — R778 Other specified abnormalities of plasma proteins: Secondary | ICD-10-CM | POA: Diagnosis not present

## 2021-08-30 LAB — BASIC METABOLIC PANEL
Anion gap: 13 (ref 5–15)
BUN: 38 mg/dL — ABNORMAL HIGH (ref 8–23)
CO2: 37 mmol/L — ABNORMAL HIGH (ref 22–32)
Calcium: 8.8 mg/dL — ABNORMAL LOW (ref 8.9–10.3)
Chloride: 88 mmol/L — ABNORMAL LOW (ref 98–111)
Creatinine, Ser: 1.29 mg/dL — ABNORMAL HIGH (ref 0.61–1.24)
GFR, Estimated: 52 mL/min — ABNORMAL LOW (ref >60)
Glucose, Bld: 78 mg/dL (ref 70–99)
Potassium: 3.6 mmol/L (ref 3.5–5.1)
Sodium: 138 mmol/L (ref 135–145)

## 2021-08-30 LAB — GLUCOSE, CAPILLARY
Glucose-Capillary: 81 mg/dL (ref 70–99)
Glucose-Capillary: 71 mg/dL (ref 70–99)
Glucose-Capillary: 71 mg/dL (ref 70–99)
Glucose-Capillary: 165 mg/dL — ABNORMAL HIGH (ref 70–99)
Glucose-Capillary: 162 mg/dL — ABNORMAL HIGH (ref 70–99)
Glucose-Capillary: 177 mg/dL — ABNORMAL HIGH (ref 70–99)

## 2021-08-30 MED ORDER — ORAL CARE MOUTH RINSE: 15.0000 mL | OROMUCOSAL | Status: DC

## 2021-08-30 MED ORDER — ORAL CARE MOUTH RINSE: 15.0000 mL | OROMUCOSAL | Status: DC | PRN

## 2021-08-30 MED ORDER — ORAL CARE MOUTH RINSE
15.0000 mL | OROMUCOSAL | Status: DC
  Administered 2021-08-30 – 2021-09-02 (×10): 15 mL via OROMUCOSAL

## 2021-08-30 NOTE — Progress Notes (Signed)
Cardiology Progress Note  Patient ID: Dominic Hodges MRN: [id number] DOB: [birth year] Date of Encounter: 08/30/2021  Primary Cardiologist: Carlyle Dolly, MD  Subjective   Chief Complaint: SOB  HPI: Good diuresis.  Appears euvolemic.  A-fib overnight.  Patient wishes to be on anticoagulation.  Discussed this with his daughter.  They would like to discuss this with him in detail.  ROS:  All other ROS reviewed and negative. Pertinent positives noted in the HPI.     Inpatient Medications  Scheduled Meds:  aspirin  81 mg Per Tube Daily   Chlorhexidine Gluconate Cloth  6 each Topical Daily   dapagliflozin propanediol  10 mg Oral Daily   famotidine  20 mg Oral QHS   fluorometholone  1 drop Both Eyes BID   free water  200 mL Per Tube TID AC & HS   furosemide  40 mg Intravenous Q12H   heparin  5,000 Units Subcutaneous Q8H   ipratropium-albuterol  3 mL Nebulization Q6H WA   mouth rinse  15 mL Mouth Rinse 4 times per day   Continuous Infusions:  cefTRIAXone (ROCEPHIN)  IV 1 g (08/29/21 2009)   feeding supplement (OSMOLITE 1.5 CAL)     PRN Meds: acetaminophen **OR** acetaminophen, albuterol, morphine injection, ondansetron **OR** ondansetron (ZOFRAN) IV, mouth rinse, mouth rinse, oxyCODONE, polyethylene glycol   Vital Signs   Vitals:   08/30/21 0400 08/30/21 0500 08/30/21 0600 08/30/21 0802  BP: (!) 147/64 (!) 121/49 (!) 124/57   Pulse: (!) 42 62    Resp: (!) 25 (!) 23 (!) 22   Temp:      TempSrc:      SpO2: 99% 99%  100%  Weight:      Height:        Intake/Output Summary (Last 24 hours) at 08/30/2021 0826 Last data filed at 08/29/2021 2341 Gross per 24 hour  Intake 500 ml  Output 2810 ml  Net -2310 ml      08/30/2021    3:51 AM 08/29/2021    3:36 AM 08/28/2021    3:31 AM  Last 3 Weights  Weight (lbs) 170 lb 13.7 oz 172 lb 13.5 oz 182 lb 1.6 oz  Weight (kg) 77.5 kg 78.4 kg 82.6 kg      Telemetry  Overnight telemetry shows A-fib heart rates 50 to 70 bpm, which  I personally reviewed.   ECG  The most recent ECG shows atrial fibrillation heart rate 81, left anterior fascicular block, which I personally reviewed.   Physical Exam   Vitals:   08/30/21 0400 08/30/21 0500 08/30/21 0600 08/30/21 0802  BP: (!) 147/64 (!) 121/49 (!) 124/57   Pulse: (!) 42 62    Resp: (!) 25 (!) 23 (!) 22   Temp:      TempSrc:      SpO2: 99% 99%  100%  Weight:      Height:        Intake/Output Summary (Last 24 hours) at 08/30/2021 0826 Last data filed at 08/29/2021 2341 Gross per 24 hour  Intake 500 ml  Output 2810 ml  Net -2310 ml       08/30/2021    3:51 AM 08/29/2021    3:36 AM 08/28/2021    3:31 AM  Last 3 Weights  Weight (lbs) 170 lb 13.7 oz 172 lb 13.5 oz 182 lb 1.6 oz  Weight (kg) 77.5 kg 78.4 kg 82.6 kg    Body mass index is 21.94 kg/m.  General: Ill-appearing Head: Atraumatic, normal  size  Eyes: PEERLA, EOMI  Neck: Supple, no JVD Endocrine: No thryomegaly Cardiac: Normal S1, S2; irregular, no murmurs Lungs: Breath sounds bilaterally Abd: Soft, nontender, no hepatomegaly  Ext: No edema, pulses 2+ Musculoskeletal: No deformities, BUE and BLE strength normal and equal Skin: Warm and dry, no rashes   Neuro: Alert and oriented to person, place, time, and situation, CNII-XII grossly intact, no focal deficits  Psych: Normal mood and affect   Labs  High Sensitivity Troponin:   Recent Labs  Lab 08/26/21 1747 08/26/21 1944 08/27/21 0533  TROPONINIHS 62* 70* 99*     Cardiac EnzymesNo results for input(s): "TROPONINI" in the last 168 hours. No results for input(s): "TROPIPOC" in the last 168 hours.  Chemistry Recent Labs  Lab 08/26/21 1747 08/27/21 0533 08/28/21 0437 08/29/21 0513 08/30/21 0330  NA 121* 128* 132* 135 138  K 4.5 3.7 3.5 3.4* 3.6  CL 85* 87* 87* 89* 88*  CO2 27 31 33* 36* 37*  GLUCOSE 228* 96 106* 88 78  BUN 36* 33* 36* 34* 38*  CREATININE 0.95 0.94 1.03 1.13 1.29*  CALCIUM 7.9* 8.2* 8.3* 8.5* 8.8*  PROT 6.7 6.2*  --    --   --   ALBUMIN 3.7 3.4*  --   --   --   AST 20 20  --   --   --   ALT 21 19  --   --   --   ALKPHOS 74 67  --   --   --   BILITOT 0.6 0.8  --   --   --   GFRNONAA >60 >60 >60 >60 52*  ANIONGAP '9 10 12 10 13    '$ Hematology Recent Labs  Lab 08/26/21 1747 08/27/21 0533  WBC 8.2 8.9  RBC 4.20* 4.23  HGB 13.2 13.3  HCT 38.0* 38.4*  MCV 90.5 90.8  MCH 31.4 31.4  MCHC 34.7 34.6  RDW 13.3 13.2  PLT 217 203   BNP Recent Labs  Lab 08/26/21 1747 08/28/21 0437 08/29/21 0513  BNP 873.0* 435.0* 652.0*    DDimer No results for input(s): "DDIMER" in the last 168 hours.   Radiology  DG Abd 1 View  Result Date: 08/29/2021 CLINICAL DATA:  Abdominal pain rule out ileus EXAM: ABDOMEN - 1 VIEW COMPARISON:  Abdomen 08/27/2021 FINDINGS: Colonic dilatation mildly improved.  No small bowel dilatation. Gastrostomy tube overlies the stomach unchanged. Lumbar scoliosis.  Bilateral hip replacement IMPRESSION: Mild improvement in diffuse colonic dilatation compatible with ileus. Electronically Signed   By: Franchot Gallo M.D.   On: 08/29/2021 14:59   DG CHEST PORT 1 VIEW  Result Date: 08/29/2021 CLINICAL DATA:  Shortness of breath EXAM: PORTABLE CHEST 1 VIEW COMPARISON:  Previous studies including the examination of 08/28/2021 FINDINGS: Transverse diameter of heart is increased. There are no signs of pulmonary edema. There is poor inspiration. Increased markings are seen in the lower lung fields, more so on the left side with no significant interval change. Lateral CP angles are clear. There is no pneumothorax. Degenerative changes are noted in both shoulders, more so on the right side. IMPRESSION: Patchy infiltrates are seen in both lower lung fields, more so on the left side suggesting atelectasis/pneumonia. Part of this finding may suggest underlying scarring. No significant interval changes are noted. Electronically Signed   By: Elmer Picker M.D.   On: 08/29/2021 09:33    Cardiac Studies   TTE 08/27/2021  1. Left ventricular ejection fraction, by estimation, is 45  to 50%. The  left ventricle has mildly decreased function. The left ventricle  demonstrates global hypokinesis. There is moderate asymmetric left  ventricular hypertrophy of the basal-septal  segment. Left ventricular diastolic parameters are indeterminate.   2. Right ventricular systolic function is mildly reduced. The right  ventricular size is mildly enlarged.   3. Left atrial size was severely dilated.   4. The mitral valve is normal in structure. Trivial mitral valve  regurgitation. No evidence of mitral stenosis.   5. The aortic valve is tricuspid. Aortic valve regurgitation is trivial.  Aortic valve sclerosis/calcification is present, without any evidence of  aortic stenosis.   6. Aortic dilatation noted. There is mild dilatation of the ascending  aorta, measuring 39 mm.   Patient Profile  Dominic Hodges is a 88 y.o. male with dysphagia status post PEG placement, hypertension who was admitted on 08/26/2021 for acute hypoxic respiratory failure secondary to congestive heart failure.  Course also complicated by new onset atrial fibrillation on 08/30/2021.  Assessment & Plan   #Acute hypoxic respiratory failure #New onset systolic heart failure, EF 45 to 50% -Appears effectively euvolemic.  Net -8.7 L since admission.  Currently alkalotic with a serum bicarbonate of 37.  Hold further IV diuresis.  Likely transition to p.o. diuretic tomorrow. -BPs have been soft.  Has been on and off midodrine.  Given advanced age and advanced comorbidities including frailty and PEG tube placement is not a candidate for aggressive cardiovascular measures.  I had a discussion with his daughter Dominic Hodges by phone [phone]).  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  He requires assistance with all ADLs.  He cannot get up without assistance.  He also has a PEG tube placed.  They were planning to  transition to likely hospice care but he was admitted with heart failure and pulmonary edema.  Patient be considered when treating him.  Approach should be palliative.  I did discuss this with the patient and her daughter today. -Does appear that the daughter understands his frailty and advanced age likely preclude a meaningful recovery.  The patient continues to express desires for aggressive care. -Okay to continue SGLT2 inhibitor which was started yesterday. -For now given EF of 45-50% would recommend to hold medical therapy.  He is effectively diuresed.  I do not believe aggressive medical care will benefit him.  He had issues with soft blood pressure and aggressive medical care will not benefit him long-term. -We can follow along to initiate GDMT but again this may not benefit him long-term. -Not a candidate for invasive angiography. -Cardiology follow along.  #PNA? -Chest x-ray with concern for possible infiltrates.  He does have a cough.  He is euvolemic.  Possibly he has pneumonia.  I will defer this to hospital medicine. -Please noticeably still short of breath despite being euvolemic.  Again is a possibility.  #New onset atrial fibrillation -Rate controlled atrial fibrillation  on no medication. Continue to hold AV nodal agents for now give soft Bps.  -Discussed anticoagulation with the patient and daughter.  The patient says this is okay.  I discussed with the daughter that this is likely of no long-term benefit given the patient's frailty and comorbidities.  He is likely appropriate for hospice.  They will discuss as a family if they would like to proceed with anticoagulation. -Continue to hold anticoagulation for now.  Family will let us know final decision.    For questions or updates, please  contact [place] Please consult [email or web address] for contact info under   Signed, [place] T. Audie Box, MD, [place]  08/30/2021 8:26 AM

## 2021-08-30 NOTE — Progress Notes (Addendum)
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 male with medical history significant of acid reflux, BPH, hyperlipidemia, hypertension, presents ED with a chief complaint of peripheral edema, generalized weakness, and dyspnea.   Patient's daughter provides most of the history.  She reports that patient has been getting heavier over the last week.  Its become a problem because his caretakers can no longer lift him to transfer him to commode.  He is also been progressively more short of breath.  He was working harder to breathe today.  This is why he came into the ER.  They report patient has had rattling breath sounds for quite some time and they have been under the impression that small upper airway.  Patient has dysphagia and is NPO.  He has had a PEG tube chronically.  Today they had him evaluated by hospice nurse.  It was the hospice nurse that also advised him to come into the ER when she saw how edematous, dyspneic, and diaphoretic patient was.  They were told that he cannot be in hospice as he does not have a diagnosis that meets the criteria.   Daughter is interested in palliative care.  Patient has not complained of any chest pain or palpitations.  He has no further complaints at this time.   Continues on PEG tube feedings.  He takes 1.2 kcals Jevity.  He needs 2 cartons at 8 AM, noon, 4 and 1 carton at 8 PM.  He has 300 cc of free water flush with each feeding.   ED: Temp 98.1, heart rate 90-100, respiratory rate 26-40, blood pressure 133/72-174/89 No leukocytosis at 8.2, hemoglobin 13.2, platelets 217 Chemistry reveals a hyponatremia and a hyperglycemia, he is still hyponatremic after correction BNP 873 Trope initially 62, and then 70 -Chest x-ray shows cardiomegaly, interstitial pulmonary edema EKG shows sinus rhythm, QTc 488, heart rate 94 Lasix 40 mg IV given  x1 DuoNeb given -Foley placed Admission requested for CHF exacerbation     Assessment/Plan:   Principal Problem:   Acute systolic CHF (congestive heart failure) (HCC) Active Problems:   Hypotensive episode   Acute respiratory failure with hypoxia (HCC)   Ileus (HCC)   Cellulitis   Essential hypertension, benign   Dysphagia, oropharyngeal   Hyponatremia   Generalized weakness   GERD (gastroesophageal reflux disease)   Elevated troponin   Goals of care, counseling/discussion  Assessment and Plan: * Acute systolic CHF (congestive heart failure) ([place]) Appreciate cardiology follow up -hold lasix IV after this am dose -not a candidate for aggressive therapies/invasive angiography -08/27/21 Echo EF 45-50%, global HK NEG 8.7 L BP remains soft  Ileus (HCC) KUB: IMPRESSION: Mild improvement in diffuse colonic dilatation compatible with Ileus. TF restarted, tolerating Monitoring   Acute respiratory failure with hypoxia ([place]) - Patient acute respiratory failure due to CHF -Patient initially required BiPAP ABG    Component Value Date/Time   PHART 7.49 (H) 08/29/2021 0951   PCO2ART 56 (H) 08/29/2021 0951   PO2ART 79 (L) 08/29/2021 0951   HCO3 42.7 (H) 08/29/2021 0951   O2SAT 98.2 08/29/2021 0951   Now weaned to 2L     Hypotensive episode -BP improving and stabilizing -Discontinue clonidine patch -Started midodrine 5 mg p.o. 3 times daily>>> BP stable, anticipating discontinuing midodrine today -Status post repleting  Albumin 25 g  x 1 08/27/21 -Monitoring closely -Unfortunately needs Lasix for volume overload shortness of breath hypoxia  Cellulitis -Expected much improved Left lower extremity edema erythema -Was on doxycycline as an outpatient with no improvement   -On admission patient was started on Rocephin - Continue -Pro-Cal less than 0.10, WBC 8.9,   Goals of care, counseling/discussion Appreciate palliative consult Continue full scope of care Plan d/c  to SNF with palliative following  Elevated troponin - Demand ischemia in the setting of hypoxia and CHF exacerbation - I-troponin 62, 70, 99 -Denies any chest pain  - Echo: LVE 89-38%, indet diastolic dysfunction but severe LAE would suggest long standing elevated LA pressures   -Monitor on telemetry  GERD (gastroesophageal reflux disease) -Continue pepcid  Generalized weakness -Severe global weakness, with severe debility -Ambulates with assist only -deconditioning and chronic illness -pt eval and treat  Hyponatremia -Na 121, (previously 137) >>> 128 >>132, 135  -Hypervolemic hyponatremia -improved with diuresis  Dysphagia, oropharyngeal -NPO  -PEG tube in place, continue tube feeds -dietician consult -at home he does 2 cartons of jevity at 8a, 12p, 4p, and one carton at 8p of jevity -He received 355m free water flushes with each feed -continue osmolite 1.5 while inpatient  Essential hypertension, benign -BP has much improved - was Hypotensive, discontinue clonidine -On midodrine---discontinued 08/29/2021      Family Communication:  daughter updated 8/23  Consultants:  palliative, cardiology  Code Status:  FULL  DVT Prophylaxis:  Thrall Heparin    Procedures: As Listed in Progress Note Above  Antibiotics: Ceftriaxone 8/19>>   Total time spent 50 minutes.  Greater than 50% spent face to face counseling and coordinating care.   Subjective: Patient denies fevers, chills, headache, chest pain, dyspnea, nausea, vomiting, diarrhea, abdominal pain, dysuria, hematuria, hematochezia, and melena.   Objective: Vitals:   08/30/21 0500 08/30/21 0600 08/30/21 0730 08/30/21 0802  BP: (!) 121/49 (!) 124/57    Pulse: 62     Resp: (!) 23 (!) 22    Temp:   97.9 F (36.6 C)   TempSrc:   Oral   SpO2: 99%   100%  Weight:      Height:        Intake/Output Summary (Last 24 hours) at 08/30/2021 1054 Last data filed at 08/29/2021 2341 Gross per 24 hour  Intake 300  ml  Output 2810 ml  Net -2510 ml   Weight change: -0.9 kg Exam:  General:  Pt is alert, follows commands appropriately, not in acute distress HEENT: No icterus, No thrush, No neck mass, [place]/AT Cardiovascular: IRRR, S1/S2, no rubs, no gallops Respiratory: bibasilar crackles.  No wheeze Abdomen: Soft/+BS, non tender, non distended, no guarding Extremities: No edema, No lymphangitis, No petechiae, No rashes, no synovitis   Data Reviewed: I have personally reviewed following labs and imaging studies Basic Metabolic Panel: Recent Labs  Lab 08/26/21 1747 08/27/21 0533 08/28/21 0437 08/29/21 0513 08/30/21 0330  NA 121* 128* 132* 135 138  K 4.5 3.7 3.5 3.4* 3.6  CL 85* 87* 87* 89* 88*  CO2 27 31 33* 36* 37*  GLUCOSE 228* 96 106* 88 78  BUN 36* 33* 36* 34* 38*  CREATININE 0.95 0.94 1.03 1.13 1.29*  CALCIUM 7.9* 8.2* 8.3* 8.5* 8.8*  MG  --  2.2  --   --   --    Liver Function Tests: Recent Labs  Lab 08/26/21 1747 08/27/21 0533  AST 20 20  ALT 21 19  ALKPHOS 74 67  BILITOT 0.6  0.8  PROT 6.7 6.2*  ALBUMIN 3.7 3.4*   No results for input(s): "LIPASE", "AMYLASE" in the last 168 hours. No results for input(s): "AMMONIA" in the last 168 hours. Coagulation Profile: No results for input(s): "INR", "PROTIME" in the last 168 hours. CBC: Recent Labs  Lab 08/26/21 1747 08/27/21 0533  WBC 8.2 8.9  NEUTROABS 7.1 6.8  HGB 13.2 13.3  HCT 38.0* 38.4*  MCV 90.5 90.8  PLT 217 203   Cardiac Enzymes: No results for input(s): "CKTOTAL", "CKMB", "CKMBINDEX", "TROPONINI" in the last 168 hours. BNP: Invalid input(s): "POCBNP" CBG: Recent Labs  Lab 08/29/21 1629 08/29/21 1929 08/29/21 2337 08/30/21 0349 08/30/21 0735  GLUCAP 84 76 79 81 71   HbA1C: No results for input(s): "HGBA1C" in the last 72 hours. Urine analysis:    Component Value Date/Time   COLORURINE YELLOW 03/14/2020 1600   APPEARANCEUR HAZY (A) 03/14/2020 1600   LABSPEC 1.018 03/14/2020 1600   PHURINE 5.0  03/14/2020 1600   GLUCOSEU NEGATIVE 03/14/2020 1600   HGBUR NEGATIVE 03/14/2020 1600   BILIRUBINUR NEGATIVE 03/14/2020 1600   KETONESUR NEGATIVE 03/14/2020 1600   PROTEINUR 30 (A) 03/14/2020 1600   NITRITE NEGATIVE 03/14/2020 1600   LEUKOCYTESUR NEGATIVE 03/14/2020 1600   Sepsis Labs: '@LABRCNTIP'$ (procalcitonin:4,lacticidven:4) ) Recent Results (from the past 240 hour(s))  MRSA Next Gen by PCR, Nasal     Status: None   Collection Time: 08/26/21  8:39 PM   Specimen: Nasal Mucosa; Nasal Swab  Result Value Ref Range Status   MRSA by PCR Next Gen NOT DETECTED NOT DETECTED Final    Comment: (NOTE) The GeneXpert MRSA Assay (FDA approved for NASAL specimens only), is one component of a comprehensive MRSA colonization surveillance program. It is not intended to diagnose MRSA infection nor to guide or monitor treatment for MRSA infections. Test performance is not FDA approved in patients less than 44 years old. Performed at [facility], [address], [place], [place] [address]      Scheduled Meds:  aspirin  81 mg Per Tube Daily   Chlorhexidine Gluconate Cloth  6 each Topical Daily   famotidine  20 mg Oral QHS   fluorometholone  1 drop Both Eyes BID   free water  200 mL Per Tube TID AC & HS   heparin  5,000 Units Subcutaneous Q8H   ipratropium-albuterol  3 mL Nebulization Q6H WA   mouth rinse  15 mL Mouth Rinse 4 times per day   Continuous Infusions:  cefTRIAXone (ROCEPHIN)  IV 1 g (08/29/21 2009)   feeding supplement (OSMOLITE 1.5 CAL) 1,000 mL (08/30/21 0837)    Procedures/Studies: DG Abd 1 View  Result Date: 08/29/2021 CLINICAL DATA:  Abdominal pain rule out ileus EXAM: ABDOMEN - 1 VIEW COMPARISON:  Abdomen 08/27/2021 FINDINGS: Colonic dilatation mildly improved.  No small bowel dilatation. Gastrostomy tube overlies the stomach unchanged. Lumbar scoliosis.  Bilateral hip replacement IMPRESSION: Mild improvement in diffuse colonic dilatation compatible with ileus. Electronically  Signed   By: Franchot Gallo M.D.   On: 08/29/2021 14:59   DG CHEST PORT 1 VIEW  Result Date: 08/29/2021 CLINICAL DATA:  Shortness of breath EXAM: PORTABLE CHEST 1 VIEW COMPARISON:  Previous studies including the examination of 08/28/2021 FINDINGS: Transverse diameter of heart is increased. There are no signs of pulmonary edema. There is poor inspiration. Increased markings are seen in the lower lung fields, more so on the left side with no significant interval change. Lateral CP angles are clear. There is no pneumothorax. Degenerative  changes are noted in both shoulders, more so on the right side. IMPRESSION: Patchy infiltrates are seen in both lower lung fields, more so on the left side suggesting atelectasis/pneumonia. Part of this finding may suggest underlying scarring. No significant interval changes are noted. Electronically Signed   By: Elmer Picker M.D.   On: 08/29/2021 09:33   DG CHEST PORT 1 VIEW  Result Date: 08/28/2021 CLINICAL DATA:  Cough, shortness of breath EXAM: PORTABLE CHEST 1 VIEW COMPARISON:  08/26/2021 FINDINGS: Stable cardiomediastinal contours. Aortic atherosclerosis. Low lung volumes. Mildly prominent interstitial markings, most pronounced in the left lung base, similar to prior. No effusion or pneumothorax. IMPRESSION: Stable chest radiograph with low lung volumes and persistent mildly prominent interstitial markings, most pronounced in the left lung base. Electronically Signed   By: Davina Poke D.O.   On: 08/28/2021 10:14   DG Abd 1 View  Result Date: 08/27/2021 CLINICAL DATA:  Abdominal distention EXAM: ABDOMEN - 1 VIEW COMPARISON:  None Available. FINDINGS: Gastrostomy is seen in upper abdomen. There is no significant small bowel dilation. Stomach is not distended. There is gaseous distention of right colon. There is no distention of left colon. Colon is interposed between liver and abdominal wall. Patchy densities are seen in the left lower lung field. There is  previous arthroplasty in both hips. Degenerative changes are noted in lumbar spine. IMPRESSION: There is a moderate to marked gaseous distention of right colon measuring up to 10.9 cm in diameter. This may suggest ileus or partial colonic obstruction. There is no significant small bowel dilation. Increased markings in left lower lung field suggest scarring or atelectasis or pneumonia. Electronically Signed   By: Elmer Picker M.D.   On: 08/27/2021 21:04   ECHOCARDIOGRAM COMPLETE  Result Date: 08/27/2021    ECHOCARDIOGRAM REPORT   Patient Name:   DR. Brooke Bonito Date of Exam: 08/27/2021 Medical Rec #:  [id number]              Height:       77.0 in Accession #:    [id number]             Weight:       165.6 lb Date of Birth:  1927-04-20              BSA:          2.064 m Patient Age:    28 years               BP:           94/46 mmHg Patient Gender: M                      HR:           83 bpm. Exam Location:  [facility] Procedure: 2D Echo, Cardiac Doppler and Color Doppler Indications:    CHF  History:        Patient has no prior history of Echocardiogram examinations.                 CHF; Risk Factors:Hypertension and Dyslipidemia.  Sonographer:    Wenda Low Referring Phys: [id number] ASIA [place]  Sonographer Comments: Suboptimal subcostal window. IMPRESSIONS  1. Left ventricular ejection fraction, by estimation, is 45 to 50%. The left ventricle has mildly decreased function. The left ventricle demonstrates global hypokinesis. There is moderate asymmetric left ventricular hypertrophy of the basal-septal segment. Left ventricular diastolic parameters are indeterminate.  2. Right  ventricular systolic function is mildly reduced. The right ventricular size is mildly enlarged.  3. Left atrial size was severely dilated.  4. The mitral valve is normal in structure. Trivial mitral valve regurgitation. No evidence of mitral stenosis.  5. The aortic valve is tricuspid. Aortic valve regurgitation is  trivial. Aortic valve sclerosis/calcification is present, without any evidence of aortic stenosis.  6. Aortic dilatation noted. There is mild dilatation of the ascending aorta, measuring 39 mm. FINDINGS  Left Ventricle: Left ventricular ejection fraction, by estimation, is 45 to 50%. The left ventricle has mildly decreased function. The left ventricle demonstrates global hypokinesis. The left ventricular internal cavity size was normal in size. There is  moderate asymmetric left ventricular hypertrophy of the basal-septal segment. Left ventricular diastolic parameters are indeterminate. Right Ventricle: The right ventricular size is mildly enlarged. Right vetricular wall thickness was not well visualized. Right ventricular systolic function is mildly reduced. The tricuspid regurgitant velocity is 2.65 m/s, and with an assumed right atrial pressure of 8 mmHg, the estimated right ventricular systolic pressure is 94.8 mmHg. Left Atrium: Left atrial size was severely dilated. Right Atrium: Right atrial size was normal in size. Pericardium: There is no evidence of pericardial effusion. Mitral Valve: The mitral valve is normal in structure. Trivial mitral valve regurgitation. No evidence of mitral valve stenosis. MV peak gradient, 2.6 mmHg. The mean mitral valve gradient is 1.0 mmHg. Tricuspid Valve: The tricuspid valve is normal in structure. Tricuspid valve regurgitation is trivial. Aortic Valve: The aortic valve is tricuspid. Aortic valve regurgitation is trivial. Aortic valve sclerosis/calcification is present, without any evidence of aortic stenosis. Aortic valve mean gradient measures 4.7 mmHg. Aortic valve peak gradient measures 9.6 mmHg. Aortic valve area, by VTI measures 1.70 cm. Pulmonic Valve: The pulmonic valve was not well visualized. Pulmonic valve regurgitation is trivial. Aorta: The aortic root is normal in size and structure and aortic dilatation noted. There is mild dilatation of the ascending aorta,  measuring 39 mm. IAS/Shunts: The interatrial septum was not well visualized.  LEFT VENTRICLE PLAX 2D LVIDd:         5.00 cm   Diastology LVIDs:         3.70 cm   LV e' lateral:   8.38 cm/s LV PW:         1.60 cm   LV E/e' lateral: 9.0 LV IVS:        1.15 cm LVOT diam:     2.00 cm LV SV:         52 LV SV Index:   25 LVOT Area:     3.14 cm  RIGHT VENTRICLE RV Basal diam:  4.40 cm RV Mid diam:    3.30 cm RV S prime:     11.60 cm/s LEFT ATRIUM              Index        RIGHT ATRIUM           Index LA diam:        5.30 cm  2.57 cm/m   RA Area:     21.40 cm LA Vol (A2C):   120.0 ml 58.13 ml/m  RA Volume:   59.90 ml  29.02 ml/m LA Vol (A4C):   89.7 ml  43.45 ml/m LA Biplane Vol: 108.0 ml 52.32 ml/m  AORTIC VALVE                     PULMONIC VALVE AV Area (Vmax):  1.84 cm      PV Vmax:       0.66 m/s AV Area (Vmean):   1.68 cm      PV Peak grad:  1.7 mmHg AV Area (VTI):     1.70 cm AV Vmax:           154.67 cm/s AV Vmean:          100.600 cm/s AV VTI:            0.302 m AV Peak Grad:      9.6 mmHg AV Mean Grad:      4.7 mmHg LVOT Vmax:         90.40 cm/s LVOT Vmean:        53.750 cm/s LVOT VTI:          0.164 m LVOT/AV VTI ratio: 0.54  AORTA Ao Root diam: 3.60 cm Ao Asc diam:  3.90 cm MITRAL VALVE               TRICUSPID VALVE MV Area (PHT): 2.43 cm    TR Peak grad:   28.1 mmHg MV Area VTI:   2.49 cm    TR Vmax:        265.00 cm/s MV Peak grad:  2.6 mmHg MV Mean grad:  1.0 mmHg    SHUNTS MV Vmax:       0.81 m/s    Systemic VTI:  0.16 m MV Vmean:      33.2 cm/s   Systemic Diam: 2.00 cm MV Decel Time: 312 msec MV E velocity: 75.40 cm/s MV A velocity: 33.00 cm/s MV E/A ratio:  2.28 Oswaldo Milian MD Electronically signed by Oswaldo Milian MD Signature Date/Time: 08/27/2021/2:07:41 PM    Final    DG Chest Port 1 View  Result Date: 08/26/2021 CLINICAL DATA:  Shortness of breath EXAM: PORTABLE CHEST 1 VIEW COMPARISON:  10/27/2020 FINDINGS: Cardiomegaly. Mild, diffuse bilateral interstitial pulmonary  opacity. The visualized skeletal structures are unremarkable. IMPRESSION: Cardiomegaly with mild, diffuse bilateral interstitial pulmonary opacity, likely edema. No focal airspace opacity. Electronically Signed   By: Delanna Ahmadi M.D.   On: 08/26/2021 18:05    Orson Eva, DO  [facility]  If 7PM-7AM, please contact night-coverage [email or web address] Password [facility] 08/30/2021, 10:54 AM   LOS: 4 days

## 2021-08-30 NOTE — Evaluation (Addendum)
Occupational Therapy Evaluation Patient Details Name: Dominic Hodges MRN: [id number] DOB: Jan 02, 1928 Today's Date: 08/30/2021   History of Present Illness Dominic Hodges is a 88 y.o. male with medical history significant of acid reflux, BPH, hyperlipidemia, hypertension, presents ED with a chief complaint of peripheral edema, generalized weakness, and dyspnea.  Patient's daughter provides most of the history.  She reports that patient has been getting heavier over the last week.  Its become a problem because his caretakers can no longer lift him to transfer him to commode.  He is also been progressively more short of breath.  He was working harder to breathe today.  This is why he came into the ER.  They report patient has had rattling breath sounds for quite some time and they have been under the impression that small upper airway.  Patient has dysphagia and is NPO.  He has had a PEG tube chronically.  Today they had him evaluated by hospice nurse.  It was the hospice nurse that also advised him to come into the ER when she saw how edematous, dyspneic, and diaphoretic patient was.  They were told that he cannot be in hospice as he does not have a diagnosis that meets the criteria.  Daughter is interested in palliative care.  Patient has not complained of any chest pain or palpitations.  He has no further complaints at this time.     Continues on PEG tube feedings.  He takes 1.2 kcals Jevity.  He needs 2 cartons at 8 AM, noon, 4 and 1 carton at 8 PM.  He has 300 cc of free water flush with each feeding.     Patient is not able to provide more history at this time as he is working hard to breathe and having difficulty understanding questions.   Clinical Impression   Pt agreeable to OT evaluation. Pt struggled to verbally communicate but did so some with extended time. Pt demonstrates limited B UE shoulder A/ROM and P/ROM with general weakness. Pt able to sit at bedside with fair balance prior to  needing max A for sit to stand with RW. Pt demonstrates poor balance in standing but was able to take a few steps to R side. Pt noted to desaturate to 63% SpO2 briefly during this time  but quickly returned to above 90% SpO2. Pt donned 4 L supplemental O2 throughout session. Pt will benefit from continued OT in the hospital and recommended venue below to increase strength, balance, and endurance for safe ADL's.         Recommendations for follow up therapy are one component of a multi-disciplinary discharge planning process, led by the attending physician.  Recommendations may be updated based on patient status, additional functional criteria and insurance authorization.   Follow Up Recommendations  Skilled nursing-short term rehab (<3 hours/day)    Assistance Recommended at Discharge Frequent or constant Supervision/Assistance  Patient can return home with the following A lot of help with walking and/or transfers;A lot of help with bathing/dressing/bathroom;Assistance with cooking/housework;Assistance with feeding;Assist for transportation;Help with stairs or ramp for entrance    Functional Status Assessment  Patient has had a recent decline in their functional status and demonstrates the ability to make significant improvements in function in a reasonable and predictable amount of time.  Equipment Recommendations  None recommended by OT    Recommendations for Other Services       Precautions / Restrictions Precautions Precautions: Fall Restrictions Weight Bearing Restrictions: No  Mobility Bed Mobility Overal bed mobility: Needs Assistance Bed Mobility: Supine to Sit, Sit to Supine     Supine to sit: Mod assist, Max assist, HOB elevated Sit to supine: Max assist   General bed mobility comments: Pt able to move B LE partially off of bed. Assist to pull to sit. Assist to lift B LE into bed and control trunk.    Transfers Overall transfer level: Needs  assistance Equipment used: Rolling walker (2 wheels) Transfers: Sit to/from Stand Sit to Stand: Max assist           General transfer comment: Able to compelte one sit to stand with RW and max A followed by a few steps to R side.      Balance Overall balance assessment: Needs assistance Sitting-balance support: Bilateral upper extremity supported, Feet supported Sitting balance-Leahy Scale: Fair Sitting balance - Comments: seated EOB   Standing balance support: Bilateral upper extremity supported, During functional activity, Reliant on assistive device for balance Standing balance-Leahy Scale: Poor Standing balance comment: using RW                           ADL either performed or assessed with clinical judgement   ADL Overall ADL's : Needs assistance/impaired     Grooming: Minimal assistance;Moderate assistance;Sitting    Upper body bathing: Mod assistance; sitting   Lower Body Bathing: Maximal assistance;Total assistance;Bed level   Upper Body Dressing : Minimal assistance;Moderate assistance;Sitting   Lower Body Dressing: Maximal assistance;Total assistance;Bed level   Toilet Transfer: Maximal assistance;Stand-pivot;Rolling walker (2 wheels)   Toileting- Clothing Manipulation and Hygiene: Total assistance;Bed level               Vision Baseline Vision/History: 1 Wears glasses Ability to See in Adequate Light: 0 Adequate Patient Visual Report: No change from baseline Vision Assessment?: No apparent visual deficits                Pertinent Vitals/Pain Pain Assessment Pain Assessment: No/denies pain     Hand Dominance Right   Extremity/Trunk Assessment Upper Extremity Assessment Upper Extremity Assessment: RUE deficits/detail;LUE deficits/detail RUE Deficits / Details: 2+/5 shoulder flexion; ~50% of available P/ROM for shoulder flexion and abduction. Good A/ROM of elbow to touch shoulder. LUE Deficits / Details: 2+/5 shoulder flexion.  ~75% P/ROM for shoulder flexion and abduction. Good elbow A/ROM to touch shoulder. 3+/5 grip strength.   Lower Extremity Assessment Lower Extremity Assessment: Defer to PT evaluation   Cervical / Trunk Assessment Cervical / Trunk Assessment: Kyphotic   Communication Communication Communication: Expressive difficulties   Cognition Arousal/Alertness: Awake/alert Behavior During Therapy: WFL for tasks assessed/performed Overall Cognitive Status: Within Functional Limits for tasks assessed                                 General Comments: Follows commands well; expressive difficulties.                      Home Living Family/patient expects to be discharged to:: Private residence Living Arrangements: Spouse/significant other Available Help at Discharge: Family;Personal care attendant;Available 24 hours/day Type of Home: House Home Access: Stairs to enter [employer] of Steps: 2 Entrance Stairs-Rails: Right;Left Home Layout: Two level;Full bath on main level;Able to live on main level with bedroom/bathroom     Bathroom Shower/Tub: Tub/shower unit   Bathroom Toilet: Handicapped height Bathroom Accessibility: Yes  Home Equipment: Wheelchair - Publishing copy (2 wheels);Rollator (4 wheels);Tub bench;Transport chair;Hospital bed;Cane - single point;BSC/3in1;Hand held shower head   Additional Comments: Patient is a retired Radiation protection practitioner MD (History obtained via PT note)      Prior Functioning/Environment Prior Level of Function : Needs assist       Physical Assist : Mobility (physical);ADLs (physical) Mobility (physical): Transfers;Gait;Stairs ADLs (physical): Grooming;Bathing;Dressing;Toileting Mobility Comments: Pt reports ability to ambulate with RW and assist at baseline. ADLs Comments: has a personal care attendent for he and his wife that comes to the home daily to assist with ADLs (per PT note)        OT Problem List: Decreased  strength;Decreased range of motion;Decreased activity tolerance;Impaired balance (sitting and/or standing);Impaired UE functional use      OT Treatment/Interventions: Self-care/ADL training;Therapeutic exercise;Therapeutic activities;Manual therapy;Patient/family education;Balance training    OT Goals(Current goals can be found in the care plan section) Acute Rehab OT Goals Patient Stated Goal: Pt open to rehab stay. OT Goal Formulation: With patient Time For Goal Achievement: 09/13/21 Potential to Achieve Goals: Good  OT Frequency: Min 2X/week                                   End of Session Equipment Utilized During Treatment: Rolling walker (2 wheels);Oxygen Nurse Communication: Other (comment) (Nurse present during much of session.)  Activity Tolerance: Patient tolerated treatment well Patient left: in bed;with call bell/phone within reach;with nursing/sitter in room  OT Visit Diagnosis: Unsteadiness on feet (R26.81);Other abnormalities of gait and mobility (R26.89);Muscle weakness (generalized) (M62.81)                Time: 4967-5916 OT Time Calculation (min): 23 min Charges:  OT General Charges $OT Visit: 1 Visit OT Evaluation $OT Eval Low Complexity: 1 Low  Tadeusz Stahl OT, MOT  Larey Seat 08/30/2021, 9:24 AM

## 2021-08-30 NOTE — TOC Initial Note (Signed)
Transition of Care [facility]) - Initial/Assessment Note    Patient Details  Name: Dominic Hodges MRN: [id number] Date of Birth: 12/19/1927  Transition of Care [facility]) CM/SW Contact:    Boneta Lucks, RN Phone Number: 08/30/2021, 4:01 PM  Clinical Narrative:     Patient admitted with acute respiratory failure with hypoxia. TOC spoke with his daughter, They feel like he will need some rehab before returning home. They requested PNC. FL2 sent. TOC to follow.               Expected Discharge Plan: Skilled Nursing Facility Barriers to Discharge: Continued Medical Work up   Patient Goals and CMS Choice Patient states their goals for this hospitalization and ongoing recovery are:: agreeable to SNF CMS Medicare.gov Compare Post Acute Care list provided to:: Patient Represenative (must comment) Choice offered to / list presented to : Adult Children  Expected Discharge Plan and Services Expected Discharge Plan: [place] arrangements for the past 2 months: Single Family Home                       Prior Living Arrangements/Services Living arrangements for the past 2 months: Single Family Home Lives with:: Spouse Patient language and need for interpreter reviewed:: Yes Do you feel safe going back to the place where you live?: Yes      Need for Family Participation in Patient Care: Yes (Comment) Care giver support system in place?: Yes (comment) Current home services: DME Criminal Activity/Legal Involvement Pertinent to Current Situation/Hospitalization: No - Comment as needed  Activities of Daily Living Home Assistive Devices/Equipment: Enteral Feeding Supplies, Eyeglasses, Environmental consultant (specify type), Wheelchair, Shower chair with back, Bedside commode/3-in-1 ADL Screening (condition at time of admission) Patient's cognitive ability adequate to safely complete daily activities?: Yes Is the patient deaf or have difficulty hearing?: No Does the patient have  difficulty seeing, even when wearing glasses/contacts?: No Does the patient have difficulty concentrating, remembering, or making decisions?: No Patient able to express need for assistance with ADLs?: Yes Does the patient have difficulty dressing or bathing?: Yes Independently performs ADLs?: No Communication: Independent Dressing (OT): Needs assistance Is this a change from baseline?: Pre-admission baseline Grooming: Needs assistance Is this a change from baseline?: Pre-admission baseline Feeding: Needs assistance Is this a change from baseline?: Pre-admission baseline Bathing: Needs assistance Is this a change from baseline?: Pre-admission baseline Toileting: Needs assistance Is this a change from baseline?: Pre-admission baseline In/Out Bed: Needs assistance Is this a change from baseline?: Pre-admission baseline Walks in Home: Independent with device (comment) (uses a walker at home) Does the patient have difficulty walking or climbing stairs?: Yes Weakness of Legs: Both Weakness of Arms/Hands: Both  Permission Sought/Granted      Permission granted to share info w Relationship: Daughter     Emotional Assessment       Orientation: : Oriented to Self, Oriented to Place, Oriented to  Time, Oriented to Situation Alcohol / Substance Use: Not Applicable Psych Involvement: No (comment)  Admission diagnosis:  CHF (congestive heart failure) (HCC) [I50.9] Patient Active Problem List   Diagnosis Date Noted   Acute systolic CHF (congestive heart failure) ([place]) 08/30/2021   Goals of care, counseling/discussion 08/30/2021   Acute respiratory failure with hypoxia ([place]) 08/29/2021    Class: Acute   Ileus ([place]) 08/29/2021    Class: Acute   Elevated troponin 08/27/2021   Hypotensive episode 08/27/2021    Class: Acute  Hyponatremia 08/26/2021   Cellulitis 08/26/2021   Generalized weakness 08/26/2021   GERD (gastroesophageal reflux disease) 08/26/2021   Gastrostomy tube in place  [facility]) 10/06/2020   Neurocognitive deficits 04/28/2020   History of anesthesia complications 37/10/6267   Recent weight loss 04/14/2020   Normochromic normocytic anemia 04/14/2020   Dysphagia, oropharyngeal 04/14/2020   Chronic cough 04/13/2020   Anorexia 04/07/2020   Failure to thrive in adult 04/07/2020   Protein-calorie malnutrition, severe ([place]) 03/16/2020   Chronic constipation 03/15/2020   Acute blood loss anemia 03/15/2020   Thrombocytopenia ([place]) 03/15/2020   Closed left femoral fracture ([place]) 03/07/2020   Accidental fall 03/07/2020   CKD (chronic kidney disease), stage III ([place]) 03/07/2020   Spinal stenosis, lumbar region, with neurogenic claudication 10/19/2015   Closed right hip fracture ([place]) 12/05/2014   Closed hip fracture ([place]) 12/05/2014   BPH (benign prostatic hyperplasia) 12/05/2014   Hyperlipidemia 12/05/2014   Essential hypertension, benign 06/01/2010   PCP:  Celene Squibb, MD Pharmacy:   [facility], [place] [address] Phone: [phone] Fax: [place], [place] [address] Phone: [phone] Fax: [phone]   Readmission Risk Interventions    08/30/2021    4:00 PM  Readmission Risk Prevention Plan  Transportation Screening Complete  PCP or Specialist Appt within 5-7 Days Not Complete  Home Care Screening Complete  Medication Review (RN CM) Complete

## 2021-08-30 NOTE — Progress Notes (Signed)
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  He appears acutely/chronically ill and very frail.  He is alert and oriented, and I believe that he is able to make his needs known given enough time.  There is no family at bedside at this time.  Chart review completed.  We briefly discussed the treatment plan.  No needs identified today.  Conference with attending, bedside nursing staff, transition of care team related to patient condition, needs, goals of care, disposition.  Plan: At this point full scope/full code, time for outcomes.  Anticipate need for short-term rehab  25 minutes minutes  Quinn Axe, NP Palliative medicine team Team phone [phone] Greater than 50% of this time was spent counseling and coordinating care related to the above assessment and plan.

## 2021-08-30 NOTE — Assessment & Plan Note (Signed)
Appreciate palliative consult Continue full scope of care Plan d/c to SNF with palliative following

## 2021-08-30 NOTE — NC FL2 (Signed)
[place] MEDICAID FL2 LEVEL OF CARE SCREENING TOOL     IDENTIFICATION  Patient Name: Dominic Hodges Birthdate: [birth year] Sex: male Admission Date (Current Location): 08/26/2021  [facility] and [place] Number:  [employer] and Address:  [facility] [address] [place]      Provider Number: [id number]  Attending Physician Name and Address:  Orson Eva, MD  Relative Name and Phone Number:  [name] (Daughter)   [phone]    Current Level of Care: Hospital Recommended Level of Care: [place] Prior Approval Number:    Date Approved/Denied:   PASRR Number: [id number]  Discharge Plan: SNF    Current Diagnoses: Patient Active Problem List   Diagnosis Date Noted   Acute systolic CHF (congestive heart failure) ([place]) 08/30/2021   Goals of care, counseling/discussion 08/30/2021   Acute respiratory failure with hypoxia ([place]) 08/29/2021   Ileus ([place]) 08/29/2021   Elevated troponin 08/27/2021   Hypotensive episode 08/27/2021   Hyponatremia 08/26/2021   Cellulitis 08/26/2021   Generalized weakness 08/26/2021   GERD (gastroesophageal reflux disease) 08/26/2021   Gastrostomy tube in place ([employer]) 10/06/2020   Neurocognitive deficits 04/28/2020   History of anesthesia complications 32/12/2480   Recent weight loss 04/14/2020   Normochromic normocytic anemia 04/14/2020   Dysphagia, oropharyngeal 04/14/2020   Chronic cough 04/13/2020   Anorexia 04/07/2020   Failure to thrive in adult 04/07/2020   Protein-calorie malnutrition, severe ([place]) 03/16/2020   Chronic constipation 03/15/2020   Acute blood loss anemia 03/15/2020   Thrombocytopenia ([place]) 03/15/2020   Closed left femoral fracture ([place]) 03/07/2020   Accidental fall 03/07/2020   CKD (chronic kidney disease), stage III ([place]) 03/07/2020   Spinal stenosis, lumbar region, with neurogenic claudication 10/19/2015   Closed right hip fracture ([place]) 12/05/2014    Closed hip fracture ([place]) 12/05/2014   BPH (benign prostatic hyperplasia) 12/05/2014   Hyperlipidemia 12/05/2014   Essential hypertension, benign 06/01/2010    Orientation RESPIRATION BLADDER Height & Weight     Self, Time, Situation, Place  Normal External catheter Weight: 77.5 kg Height:  '6\' 2"'$  (188 cm)  BEHAVIORAL SYMPTOMS/MOOD NEUROLOGICAL BOWEL NUTRITION STATUS      Continent Diet (See DC summary)  AMBULATORY STATUS COMMUNICATION OF NEEDS Skin     Verbally Normal                       Personal Care Assistance Level of Assistance  Bathing, Feeding, Dressing Bathing Assistance: Maximum assistance Feeding assistance: Limited assistance Dressing Assistance: Maximum assistance     Functional Limitations Info  Sight, Hearing, Speech Sight Info: Impaired Hearing Info: Adequate Speech Info: Adequate    SPECIAL CARE FACTORS FREQUENCY  PT (By licensed PT)     PT Frequency: 5 times a week              Contractures Contractures Info: Not present    Additional Factors Info  Allergies, Code Status Code Status Info: FULL Allergies Info: Robaxin           Current Medications (08/30/2021):  This is the current hospital active medication list Current Facility-Administered Medications  Medication Dose Route Frequency Provider Last Rate Last Admin   acetaminophen (TYLENOL) tablet 650 mg  650 mg Oral Q6H PRN Zierle-Ghosh, Asia B, DO       Or   acetaminophen (TYLENOL) suppository 650 mg  650 mg Rectal Q6H PRN Zierle-Ghosh, Asia B, DO       albuterol (PROVENTIL) (2.5  MG/3ML) 0.083% nebulizer solution 2.5 mg  2.5 mg Nebulization Q4H PRN Zierle-Ghosh, Asia B, DO   2.5 mg at 08/27/21 2110   aspirin chewable tablet 81 mg  81 mg Per Tube Daily Zierle-Ghosh, Asia B, DO   81 mg at 08/30/21 0836   cefTRIAXone (ROCEPHIN) 1 g in sodium chloride 0.9 % 100 mL IVPB  1 g Intravenous Q24H Zierle-Ghosh, Asia B, DO   Stopped at 08/29/21 2041   Chlorhexidine Gluconate Cloth 2 % PADS 6  each  6 each Topical Daily Zierle-Ghosh, Asia B, DO   6 each at 08/30/21 0837   famotidine (PEPCID) tablet 20 mg  20 mg Oral QHS Zierle-Ghosh, Asia B, DO   20 mg at 08/29/21 2147   feeding supplement (OSMOLITE 1.5 CAL) liquid 1,000 mL  1,000 mL Per Tube Continuous Skipper Cliche A, MD 25 mL/hr at 08/30/21 1542 Infusion Verify at 08/30/21 1542   fluorometholone (FML) 0.1 % ophthalmic suspension 1 drop  1 drop Both Eyes BID Zierle-Ghosh, Asia B, DO   1 drop at 08/30/21 0836   free water 200 mL  200 mL Per Tube TID AC & HS Zierle-Ghosh, Asia B, DO   200 mL at 08/30/21 1200   heparin injection 5,000 Units  5,000 Units Subcutaneous Q8H Zierle-Ghosh, Asia B, DO   5,000 Units at 08/30/21 1452   ipratropium-albuterol (DUONEB) 0.5-2.5 (3) MG/3ML nebulizer solution 3 mL  3 mL Nebulization Q6H WA Shahmehdi, Seyed A, MD   3 mL at 08/30/21 1308   morphine (PF) 2 MG/ML injection 2 mg  2 mg Intravenous Q2H PRN Zierle-Ghosh, Asia B, DO   2 mg at 08/29/21 2250   ondansetron (ZOFRAN) tablet 4 mg  4 mg Oral Q6H PRN Zierle-Ghosh, Asia B, DO       Or   ondansetron (ZOFRAN) injection 4 mg  4 mg Intravenous Q6H PRN Zierle-Ghosh, Asia B, DO       Oral care mouth rinse  15 mL Mouth Rinse 4 times per day Zierle-Ghosh, Asia B, DO   15 mL at 08/30/21 1200   Oral care mouth rinse  15 mL Mouth Rinse PRN Zierle-Ghosh, Asia B, DO       Oral care mouth rinse  15 mL Mouth Rinse PRN Shahmehdi, Seyed A, MD       oxyCODONE (Oxy IR/ROXICODONE) immediate release tablet 5 mg  5 mg Oral Q4H PRN Zierle-Ghosh, Asia B, DO       polyethylene glycol (MIRALAX / GLYCOLAX) packet 17 g  17 g Per Tube Daily PRN Zierle-Ghosh, Asia B, DO         Discharge Medications: Please see discharge summary for a list of discharge medications.  Relevant Imaging Results:  Relevant Lab Results:   Additional Information SS# [id number]  Boneta Lucks, RN

## 2021-08-30 NOTE — Assessment & Plan Note (Addendum)
Appreciate cardiology follow up -IV lasix transitioned to po torsemide 20 mg bid -not a candidate for aggressive therapies/invasive angiography -08/27/21 Echo EF 45-50%, global HK NEG 10.1 L BP remains soft, but improving

## 2021-08-30 NOTE — Progress Notes (Signed)
Patient wanted to come off Bipap for awhile, so took patient off and placed him back on 4L [place].

## 2021-08-30 NOTE — Progress Notes (Signed)
Patient does not wish to wear BIPAP tonight. Told patient to let RT know if he got in distress. Unit still at bedside.

## 2021-08-30 NOTE — Plan of Care (Signed)
  Problem: Acute Rehab OT Goals (only OT should resolve) Goal: Pt. Will Perform Grooming Flowsheets (Taken 08/30/2021 [phone]) Pt Will Perform Grooming:  with min guard assist  sitting Goal: Pt. Will Perform Upper Body Bathing Flowsheets (Taken 08/30/2021 0927) Pt Will Perform Upper Body Bathing:  with min assist  sitting Goal: Pt. Will Perform Lower Body Bathing Flowsheets (Taken 08/30/2021 0927) Pt Will Perform Lower Body Bathing:  with mod assist  sitting/lateral leans  with adaptive equipment  with min assist Goal: Pt. Will Perform Upper Body Dressing Flowsheets (Taken 08/30/2021 0927) Pt Will Perform Upper Body Dressing:  with min assist  sitting Goal: Pt. Will Transfer To Toilet Flowsheets (Taken 08/30/2021 [phone]) Pt Will Transfer to Toilet:  with min assist  stand pivot transfer Goal: Pt/Caregiver Will Perform Home Exercise Program Flowsheets (Taken 08/30/2021 [phone]) Pt/caregiver will Perform Home Exercise Program:  Increased strength  Increased ROM  Both right and left upper extremity  With minimal assist  Nezzie Manera OT, MOT

## 2021-08-30 NOTE — Progress Notes (Addendum)
Nutrition Follow up  DOCUMENTATION CODES:    INTERVENTION:  -Continue Osmolite 1.5 @ 25 ml/hr for another 24 hr. RD will f/u in the morning  -Free water 200 ml TID   NUTRITION DIAGNOSIS:   Inadequate oral intake related to inability to eat as evidenced by NPO status (chronic and PEG dependent).   -TF resumed.   GOAL:  Patient will meet greater than or equal to 90% of their needs- if congruent with patient healthcare wishes.   -enteral nutrition provided  MONITOR:  Labs, Skin, Weight trends, TF tolerance  REASON FOR ASSESSMENT:   Consult Enteral/tube feeding initiation and management  ASSESSMENT:  Patient is a 88 yo male with HTN, Dysphagia, PEG and presents with shortness of breath, BLE edema, new CHF.   Patient not currently receiving tube feeding- nurse reports concern for ileus.   NPO-chronically. PEG tube with Jevity 1.2 with  (1659 ml) daily and free water 200 ml- TID. Providing 1990 kcal, 92 gr protein and 1338 ml water from formula and 600 ml free water flushes.  8/22 Patient is cleared to resume tube feeding- will change formula to Osmolite 1.5 to limit excess volume. Continue free water flushes. Discussed patient with nursing. He has not had a BM since 8/19. Nursing is requesting KUB prior to starting TF. RD will follow for results of imaging.   Palliative following. Patient desires full scope of care at this time.  8/23 Talked with nursing. Bloating and distention continues and no BM x 4 days.  Patient tube feeding resumed this morning Osmolite 1.5 @ 25 ml/hr. Will continue at current rate another 24 hr and re-assess.   Output since admission Net -8.733 liters.   Weight encounters: 10/06/20-wt 75.3 kg and current- 77.5 kg. Nutrition needs calculated based on adjusted weight (78 kg).   Medications: Pepcid, lasix (40 mg BID). IV- rocephin.   Intake/Output Summary (Last 24 hours) at 08/30/2021 1455 Last data filed at 08/29/2021 2341 Gross per 24 hour  Intake 300  ml  Output 1260 ml  Net -960 ml        Latest Ref Rng & Units 08/30/2021    3:30 AM 08/29/2021    5:13 AM 08/28/2021    4:37 AM  BMP  Glucose 70 - 99 mg/dL 78  88  106   BUN 8 - 23 mg/dL 38  34  36   Creatinine 0.61 - 1.24 mg/dL 1.29  1.13  1.03   Sodium 135 - 145 mmol/L 138  135  132   Potassium 3.5 - 5.1 mmol/L 3.6  3.4  3.5   Chloride 98 - 111 mmol/L 88  89  87   CO2 22 - 32 mmol/L 37  36  33   Calcium 8.9 - 10.3 mg/dL 8.8  8.5  8.3      Diet Order:   Diet Order             Diet NPO time specified  Diet effective now                   EDUCATION NEEDS:  Not appropriate for education at this time  Skin:  Skin Assessment: Reviewed RN Assessment (dry, flaky skin)  Last BM:  8/19 bloating per nursing assessment  Height:   Ht Readings from Last 1 Encounters:  08/27/21 '6\' 2"'$  (1.88 m)    Weight:   Wt Readings from Last 1 Encounters:  08/30/21 77.5 kg    Ideal Body Weight:   86 kg  BMI:  Body mass index is 21.94 kg/m.  Estimated Nutritional Needs:   Kcal:  1950- 2100  Protein:  90-95 gr  Fluid:  < 2 liters daily   Colman Cater MS,RD,CSG,LDN Contact: Shea Evans

## 2021-08-30 NOTE — Progress Notes (Signed)
Change in cardiac rhythm on monitor. Was NSR now in afib. Verified by 12 lead ekg. Notified MD via secure chat. No new orders at this time. Patient is well controlled rate wise, in the 3s. He is on subq heparin.

## 2021-08-31 ENCOUNTER — Inpatient Hospital Stay (HOSPITAL_COMMUNITY): Payer: Medicare Other

## 2021-08-31 DIAGNOSIS — Z515 Encounter for palliative care: Secondary | ICD-10-CM | POA: Diagnosis not present

## 2021-08-31 DIAGNOSIS — R1312 Dysphagia, oropharyngeal phase: Secondary | ICD-10-CM | POA: Diagnosis not present

## 2021-08-31 DIAGNOSIS — J9601 Acute respiratory failure with hypoxia: Secondary | ICD-10-CM | POA: Diagnosis not present

## 2021-08-31 DIAGNOSIS — R778 Other specified abnormalities of plasma proteins: Secondary | ICD-10-CM | POA: Diagnosis not present

## 2021-08-31 DIAGNOSIS — I4891 Unspecified atrial fibrillation: Secondary | ICD-10-CM

## 2021-08-31 DIAGNOSIS — I5021 Acute systolic (congestive) heart failure: Secondary | ICD-10-CM | POA: Diagnosis not present

## 2021-08-31 DIAGNOSIS — I5041 Acute combined systolic (congestive) and diastolic (congestive) heart failure: Secondary | ICD-10-CM | POA: Diagnosis not present

## 2021-08-31 DIAGNOSIS — Z7189 Other specified counseling: Secondary | ICD-10-CM | POA: Diagnosis not present

## 2021-08-31 LAB — BASIC METABOLIC PANEL
Anion gap: 13 (ref 5–15)
BUN: 38 mg/dL — ABNORMAL HIGH (ref 8–23)
CO2: 36 mmol/L — ABNORMAL HIGH (ref 22–32)
Calcium: 8.9 mg/dL (ref 8.9–10.3)
Chloride: 90 mmol/L — ABNORMAL LOW (ref 98–111)
Creatinine, Ser: 1.01 mg/dL (ref 0.61–1.24)
GFR, Estimated: >60 mL/min (ref >60)
Glucose, Bld: 152 mg/dL — ABNORMAL HIGH (ref 70–99)
Potassium: 3.2 mmol/L — ABNORMAL LOW (ref 3.5–5.1)
Sodium: 139 mmol/L (ref 135–145)

## 2021-08-31 LAB — GLUCOSE, CAPILLARY
Glucose-Capillary: 163 mg/dL — ABNORMAL HIGH (ref 70–99)
Glucose-Capillary: 148 mg/dL — ABNORMAL HIGH (ref 70–99)
Glucose-Capillary: 132 mg/dL — ABNORMAL HIGH (ref 70–99)
Glucose-Capillary: 129 mg/dL — ABNORMAL HIGH (ref 70–99)
Glucose-Capillary: 107 mg/dL — ABNORMAL HIGH (ref 70–99)

## 2021-08-31 LAB — MAGNESIUM: Magnesium: 2.3 mg/dL (ref 1.7–2.4)

## 2021-08-31 MED ORDER — POTASSIUM CHLORIDE 20 MEQ PO PACK
40.0000 meq | PACK | Freq: Four times a day (QID) | ORAL | Status: AC
  Administered 2021-08-31 (×2): 40 meq
  Filled 2021-08-31 (×2): qty 2

## 2021-08-31 MED ORDER — POTASSIUM CHLORIDE CRYS ER 20 MEQ PO TBCR
40.0000 meq | EXTENDED_RELEASE_TABLET | Freq: Four times a day (QID) | ORAL | Status: DC
  Filled 2021-08-31: qty 2

## 2021-08-31 MED ORDER — IOHEXOL 9 MG/ML PO SOLN
ORAL | Status: AC
  Filled 2021-08-31: qty 1000

## 2021-08-31 MED ORDER — APIXABAN 5 MG PO TABS
5.0000 mg | ORAL_TABLET | Freq: Two times a day (BID) | ORAL | Status: DC
  Filled 2021-08-31: qty 1

## 2021-08-31 MED ORDER — APIXABAN 5 MG PO TABS: 5.0000 mg | ORAL_TABLET | Freq: Two times a day (BID) | ORAL | Status: DC

## 2021-08-31 MED ORDER — TORSEMIDE 20 MG PO TABS
20.0000 mg | ORAL_TABLET | Freq: Two times a day (BID) | ORAL | Status: DC
Start: 2021-08-31 — End: 2021-08-31

## 2021-08-31 MED ORDER — TORSEMIDE 20 MG PO TABS
20.0000 mg | ORAL_TABLET | Freq: Two times a day (BID) | ORAL | Status: DC
  Administered 2021-08-31 – 2021-09-02 (×4): 20 mg
  Filled 2021-08-31 (×5): qty 1

## 2021-08-31 MED ORDER — APIXABAN 5 MG PO TABS
5.0000 mg | ORAL_TABLET | Freq: Two times a day (BID) | ORAL | Status: DC
  Administered 2021-08-31 – 2021-09-02 (×5): 5 mg
  Filled 2021-08-31 (×6): qty 1

## 2021-08-31 NOTE — Assessment & Plan Note (Addendum)
-  developed during admission, rate controlled without medications.  - seen by Dr Marisue Ivan yesterday with talks with patient and family about anticoagulation, family was to consider and make final decision based on overall prognosis and risk vs benefit evaluation -discussed risks/benefits with pt and daughter>>want to start apixaban -rate controlled

## 2021-08-31 NOTE — Progress Notes (Signed)
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  He appears acutely/chronically ill and quite frail.  He is alert, making and mostly keeping eye contact.  I asked orientation questions, but he is quite slow to answer.  I asked if he has had trouble speaking for quite some time and he acknowledges this to be true.  We talk in generalities about the treatment plan.  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  We talk about his weakness and frailty.   At this point he wants to continue full scope/full code.  I share that if full scope care is his goal, he would benefit from going to short-term rehab.     Call to daughter/healthcare surrogate, [name].  No answer, left somewhat detailed voicemail message.  Conference with attending, bedside nursing staff, transition of care team related to patient condition, needs, goals of care, disposition.  Plan: Continue full scope/full code.  Although he wants to go home, family is unable to care for him at home and he needs short-term rehab.  Time for outcomes.   22 minutes Quinn Axe, NP Palliative medicine team Team phone [phone] Greater than 50% of this time was spent counseling and coordinating care related to the above assessment and plan.

## 2021-08-31 NOTE — Progress Notes (Signed)
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

## 2021-08-31 NOTE — Progress Notes (Addendum)
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   Patient's daughter provides most of the history.  She reports that patient has been getting heavier over the last week.  Its become a problem because his caretakers can no longer lift him to transfer him to commode.  He is also been progressively more short of breath.  He was working harder to breathe today.  This is why he came into the ER.  They report patient has had rattling breath sounds for quite some time and they have been under the impression that small upper airway.  Patient has dysphagia and is NPO.  He has had a PEG tube chronically.  Today they had him evaluated by hospice nurse.  It was the hospice nurse that also advised him to come into the ER when she saw how edematous, dyspneic, and diaphoretic patient was.  They were told that he cannot be in hospice as he does not have a diagnosis that meets the criteria.   Daughter is interested in palliative care.  Patient has not complained of any chest pain or palpitations.  He has no further complaints at this time.   Continues on PEG tube feedings.  He takes 1.2 kcals Jevity.  He needs 2 cartons at 8 AM, noon, 4 and 1 carton at 8 PM.  He has 300 cc of free water flush with each feeding.   ED: Temp 98.1, heart rate 90-100, respiratory rate 26-40, blood pressure 133/72-174/89 No leukocytosis at 8.2, hemoglobin 13.2, platelets 217 Chemistry reveals a hyponatremia and a hyperglycemia, he is still hyponatremic after correction BNP [address] initially 62, and then 70 -Chest x-ray shows cardiomegaly, interstitial pulmonary edema EKG shows sinus rhythm, QTc 488, heart rate 94 Lasix 40 mg IV given  x1 DuoNeb given -Foley placed Admission requested for CHF exacerbation      Assessment and Plan: * Acute systolic CHF (congestive heart failure) ([place]) Appreciate cardiology follow up -IV lasix transitioned to po torsemide 20 mg bid -not a candidate for aggressive therapies/invasive angiography -08/27/21 Echo EF 45-50%, global HK NEG 9.1 L BP remains sof  Ileus ([place]) 4/22 KUB--Mild improvement in diffuse colonic dilatation compatible with Ileus. -abd remains mildly distended -obtain CT abd/pelvis   Acute respiratory failure with hypoxia ([place]) - Patient acute respiratory failure due to CHF -Patient initially required BiPAP ABG    Component Value Date/Time   PHART 7.49 (H) 08/29/2021 0951   PCO2ART 56 (H) 08/29/2021 0951   PO2ART 79 (L) 08/29/2021 0951   HCO3 42.7 (H) 08/29/2021 0951   O2SAT 98.2 08/29/2021 0951   Now weaned to 2L  Hypotensive episode -BP improved and stable albeit soft -Discontinue clonidine patch -Started midodrine 5 mg p.o. 3 times daily>>> BP stable>>stopped -Status post repleting  Albumin 25 g x 1 08/27/21 -Monitoring closely  Cellulitis -Expected much improved Left lower extremity edema erythema -Was on doxycycline as an outpatient with no improvement   -On admission patient was started on Rocephin - Continue -Pro-Cal less than 0.10, WBC 8.9,   New onset atrial fibrillation ([place]) -developed during admission, rate controlled without medications.  - seen by Dr Marisue Ivan yesterday with talks with patient and family  about anticoagulation, family was to consider and make final decision based on overall prognosis and risk vs benefit evaluation -discussed risks/benefits with pt and daughter>>want to start apixaban  Goals of care, counseling/discussion Appreciate palliative consult Continue full scope of care Plan d/c to SNF with palliative following  Elevated troponin - Demand ischemia in the setting of hypoxia and CHF exacerbation - I-troponin  62, 70, 99 -Denies any chest pain  - Echo: LVE 81-82%, indet diastolic dysfunction but severe LAE would suggest long standing elevated LA pressures   -Monitor on telemetry  GERD (gastroesophageal reflux disease) -Continue pepcid  Generalized weakness -Severe global weakness, with severe debility -Ambulates with assist only -deconditioning and chronic illness -pt eval and treat>>SNF  Hyponatremia -Na 121, (previously 137) >>> 128 >>132, 135  -Hypervolemic hyponatremia -improved with diuresis  Dysphagia, oropharyngeal -NPO  -PEG tube in place, continue tube feeds when ileus improved -dietician consult -at home he does 2 cartons of jevity at 8a, 12p, 4p, and one carton at 8p of jevity -He received 338m free water flushes with each feed -continue osmolite 1.5 while inpatient  Essential hypertension, benign -BP has much improved - was Hypotensive, discontinude clonidine -On midodrine---discontinued 08/29/2021   Family Communication:  daughter updated 8/24   Consultants:  palliative, cardiology   Code Status:  FULL   DVT Prophylaxis:  apixaban     Procedures: As Listed in Progress Note Above   Antibiotics: Ceftriaxone 8/19>>     Subjective: Patient denies fevers, chills, headache, chest pain, dyspnea, nausea, vomiting, diarrhea, abdominal pain, dysuria, hematuria, hematochezia, and melena.   Objective: Vitals:   08/31/21 0700 08/31/21 0800 08/31/21 0838 08/31/21 1214  BP: 118/69 130/81    Pulse: 60 79    Resp: 16 19    Temp:    97.8 F (36.6 C)  TempSrc:    Axillary  SpO2: 95% 100% 96%   Weight:      Height:        Intake/Output Summary (Last 24 hours) at 08/31/2021 1222 Last data filed at 08/31/2021 0550 Gross per 24 hour  Intake 1643.3 ml  Output 950 ml  Net 693.3 ml   Weight change: -1.9 kg Exam:  General:  Pt is alert, follows commands appropriately, not in acute distress HEENT: No icterus, No thrush, No neck mass, [place]/AT Cardiovascular:  IRRR, S1/S2, no rubs, no gallops Respiratory: bibasilar rales. No wheeze Abdomen: Soft/+BS, non tender, non distended, no guarding Extremities: No edema, No lymphangitis, No petechiae, No rashes, no synovitis   Data Reviewed: I have personally reviewed following labs and imaging studies Basic Metabolic Panel: Recent Labs  Lab 08/27/21 0533 08/28/21 0437 08/29/21 0513 08/30/21 0330 08/31/21 0309  NA 128* 132* 135 138 139  K 3.7 3.5 3.4* 3.6 3.2*  CL 87* 87* 89* 88* 90*  CO2 31 33* 36* 37* 36*  GLUCOSE 96 106* 88 78 152*  BUN 33* 36* 34* 38* 38*  CREATININE 0.94 1.03 1.13 1.29* 1.01  CALCIUM 8.2* 8.3* 8.5* 8.8* 8.9  MG 2.2  --   --   --  2.3   Liver Function Tests: Recent Labs  Lab 08/26/21 1747 08/27/21 0533  AST 20 20  ALT 21 19  ALKPHOS 74 67  BILITOT 0.6 0.8  PROT 6.7 6.2*  ALBUMIN 3.7 3.4*   No results for input(s): "LIPASE", "AMYLASE" in the last 168 hours. No results for input(s): "AMMONIA" in the last 168 hours. Coagulation Profile: No results for input(s): "INR", "PROTIME" in the last 168  hours. CBC: Recent Labs  Lab 08/26/21 1747 08/27/21 0533  WBC 8.2 8.9  NEUTROABS 7.1 6.8  HGB 13.2 13.3  HCT 38.0* 38.4*  MCV 90.5 90.8  PLT 217 203   Cardiac Enzymes: No results for input(s): "CKTOTAL", "CKMB", "CKMBINDEX", "TROPONINI" in the last 168 hours. BNP: Invalid input(s): "POCBNP" CBG: Recent Labs  Lab 08/30/21 1938 08/30/21 2320 08/31/21 0336 08/31/21 0756 08/31/21 1144  GLUCAP 162* 177* 163* 148* 132*   HbA1C: No results for input(s): "HGBA1C" in the last 72 hours. Urine analysis:    Component Value Date/Time   COLORURINE YELLOW 03/14/2020 1600   APPEARANCEUR HAZY (A) 03/14/2020 1600   LABSPEC 1.018 03/14/2020 1600   PHURINE 5.0 03/14/2020 1600   GLUCOSEU NEGATIVE 03/14/2020 1600   HGBUR NEGATIVE 03/14/2020 1600   BILIRUBINUR NEGATIVE 03/14/2020 1600   KETONESUR NEGATIVE 03/14/2020 1600   PROTEINUR 30 (A) 03/14/2020 1600   NITRITE  NEGATIVE 03/14/2020 1600   LEUKOCYTESUR NEGATIVE 03/14/2020 1600   Sepsis Labs: '@LABRCNTIP'$ (procalcitonin:4,lacticidven:4) ) Recent Results (from the past 240 hour(s))  MRSA Next Gen by PCR, Nasal     Status: None   Collection Time: 08/26/21  8:39 PM   Specimen: Nasal Mucosa; Nasal Swab  Result Value Ref Range Status   MRSA by PCR Next Gen NOT DETECTED NOT DETECTED Final    Comment: (NOTE) The GeneXpert MRSA Assay (FDA approved for NASAL specimens only), is one component of a comprehensive MRSA colonization surveillance program. It is not intended to diagnose MRSA infection nor to guide or monitor treatment for MRSA infections. Test performance is not FDA approved in patients less than 32 years old. Performed at [facility], [address] [place], [place] [address]      Scheduled Meds:  aspirin  81 mg Per Tube Daily   Chlorhexidine Gluconate Cloth  6 each Topical Daily   famotidine  20 mg Oral QHS   fluorometholone  1 drop Both Eyes BID   free water  200 mL Per Tube TID AC & HS   heparin  5,000 Units Subcutaneous Q8H   ipratropium-albuterol  3 mL Nebulization Q6H WA   mouth rinse  15 mL Mouth Rinse 4 times per day   potassium chloride  40 mEq Per Tube Q6H   torsemide  20 mg Per Tube BID   Continuous Infusions:  cefTRIAXone (ROCEPHIN)  IV 1 g (08/30/21 2041)   feeding supplement (OSMOLITE 1.5 CAL) 25 mL/hr at 08/31/21 0550    Procedures/Studies: DG Abd 1 View  Result Date: 08/29/2021 CLINICAL DATA:  Abdominal pain rule out ileus EXAM: ABDOMEN - 1 VIEW COMPARISON:  Abdomen 08/27/2021 FINDINGS: Colonic dilatation mildly improved.  No small bowel dilatation. Gastrostomy tube overlies the stomach unchanged. Lumbar scoliosis.  Bilateral hip replacement IMPRESSION: Mild improvement in diffuse colonic dilatation compatible with ileus. Electronically Signed   By: Franchot Gallo M.D.   On: 08/29/2021 14:59   DG CHEST PORT 1 VIEW  Result Date: 08/29/2021 CLINICAL DATA:  Shortness  of breath EXAM: PORTABLE CHEST 1 VIEW COMPARISON:  Previous studies including the examination of 08/28/2021 FINDINGS: Transverse diameter of heart is increased. There are no signs of pulmonary edema. There is poor inspiration. Increased markings are seen in the lower lung fields, more so on the left side with no significant interval change. Lateral CP angles are clear. There is no pneumothorax. Degenerative changes are noted in both shoulders, more so on the right side. IMPRESSION: Patchy infiltrates are seen in both lower lung fields, more so  on the left side suggesting atelectasis/pneumonia. Part of this finding may suggest underlying scarring. No significant interval changes are noted. Electronically Signed   By: Elmer Picker M.D.   On: 08/29/2021 09:33   DG CHEST PORT 1 VIEW  Result Date: 08/28/2021 CLINICAL DATA:  Cough, shortness of breath EXAM: PORTABLE CHEST 1 VIEW COMPARISON:  08/26/2021 FINDINGS: Stable cardiomediastinal contours. Aortic atherosclerosis. Low lung volumes. Mildly prominent interstitial markings, most pronounced in the left lung base, similar to prior. No effusion or pneumothorax. IMPRESSION: Stable chest radiograph with low lung volumes and persistent mildly prominent interstitial markings, most pronounced in the left lung base. Electronically Signed   By: Davina Poke D.O.   On: 08/28/2021 10:14   DG Abd 1 View  Result Date: 08/27/2021 CLINICAL DATA:  Abdominal distention EXAM: ABDOMEN - 1 VIEW COMPARISON:  None Available. FINDINGS: Gastrostomy is seen in upper abdomen. There is no significant small bowel dilation. Stomach is not distended. There is gaseous distention of right colon. There is no distention of left colon. Colon is interposed between liver and abdominal wall. Patchy densities are seen in the left lower lung field. There is previous arthroplasty in both hips. Degenerative changes are noted in lumbar spine. IMPRESSION: There is a moderate to marked gaseous  distention of right colon measuring up to 10.9 cm in diameter. This may suggest ileus or partial colonic obstruction. There is no significant small bowel dilation. Increased markings in left lower lung field suggest scarring or atelectasis or pneumonia. Electronically Signed   By: Elmer Picker M.D.   On: 08/27/2021 21:04   ECHOCARDIOGRAM COMPLETE  Result Date: 08/27/2021    ECHOCARDIOGRAM REPORT   Patient Name:   DR. Brooke Bonito Date of Exam: 08/27/2021 Medical Rec #:  [id number]              Height:       77.0 in Accession #:    [id number]             Weight:       165.6 lb Date of Birth:  [birth year]              BSA:          2.064 m Patient Age:    41 years               BP:           94/46 mmHg Patient Gender: M                      HR:           83 bpm. Exam Location:  [facility] Procedure: 2D Echo, Cardiac Doppler and Color Doppler Indications:    CHF  History:        Patient has no prior history of Echocardiogram examinations.                 CHF; Risk Factors:Hypertension and Dyslipidemia.  Sonographer:    Wenda Low Referring Phys: [id number] ASIA B [place]  Sonographer Comments: Suboptimal subcostal window. IMPRESSIONS  1. Left ventricular ejection fraction, by estimation, is 45 to 50%. The left ventricle has mildly decreased function. The left ventricle demonstrates global hypokinesis. There is moderate asymmetric left ventricular hypertrophy of the basal-septal segment. Left ventricular diastolic parameters are indeterminate.  2. Right ventricular systolic function is mildly reduced. The right ventricular size is mildly enlarged.  3. Left atrial size was severely dilated.  4. The  mitral valve is normal in structure. Trivial mitral valve regurgitation. No evidence of mitral stenosis.  5. The aortic valve is tricuspid. Aortic valve regurgitation is trivial. Aortic valve sclerosis/calcification is present, without any evidence of aortic stenosis.  6. Aortic dilatation noted. There is  mild dilatation of the ascending aorta, measuring 39 mm. FINDINGS  Left Ventricle: Left ventricular ejection fraction, by estimation, is 45 to 50%. The left ventricle has mildly decreased function. The left ventricle demonstrates global hypokinesis. The left ventricular internal cavity size was normal in size. There is  moderate asymmetric left ventricular hypertrophy of the basal-septal segment. Left ventricular diastolic parameters are indeterminate. Right Ventricle: The right ventricular size is mildly enlarged. Right vetricular wall thickness was not well visualized. Right ventricular systolic function is mildly reduced. The tricuspid regurgitant velocity is 2.65 m/s, and with an assumed right atrial pressure of 8 mmHg, the estimated right ventricular systolic pressure is 23.5 mmHg. Left Atrium: Left atrial size was severely dilated. Right Atrium: Right atrial size was normal in size. Pericardium: There is no evidence of pericardial effusion. Mitral Valve: The mitral valve is normal in structure. Trivial mitral valve regurgitation. No evidence of mitral valve stenosis. MV peak gradient, 2.6 mmHg. The mean mitral valve gradient is 1.0 mmHg. Tricuspid Valve: The tricuspid valve is normal in structure. Tricuspid valve regurgitation is trivial. Aortic Valve: The aortic valve is tricuspid. Aortic valve regurgitation is trivial. Aortic valve sclerosis/calcification is present, without any evidence of aortic stenosis. Aortic valve mean gradient measures 4.7 mmHg. Aortic valve peak gradient measures 9.6 mmHg. Aortic valve area, by VTI measures 1.70 cm. Pulmonic Valve: The pulmonic valve was not well visualized. Pulmonic valve regurgitation is trivial. Aorta: The aortic root is normal in size and structure and aortic dilatation noted. There is mild dilatation of the ascending aorta, measuring 39 mm. IAS/Shunts: The interatrial septum was not well visualized.  LEFT VENTRICLE PLAX 2D LVIDd:         5.00 cm   Diastology  LVIDs:         3.70 cm   LV e' lateral:   8.38 cm/s LV PW:         1.60 cm   LV E/e' lateral: 9.0 LV IVS:        1.15 cm LVOT diam:     2.00 cm LV SV:         52 LV SV Index:   25 LVOT Area:     3.14 cm  RIGHT VENTRICLE RV Basal diam:  4.40 cm RV Mid diam:    3.30 cm RV S prime:     11.60 cm/s LEFT ATRIUM              Index        RIGHT ATRIUM           Index LA diam:        5.30 cm  2.57 cm/m   RA Area:     21.40 cm LA Vol (A2C):   120.0 ml 58.13 ml/m  RA Volume:   59.90 ml  29.02 ml/m LA Vol (A4C):   89.7 ml  43.45 ml/m LA Biplane Vol: 108.0 ml 52.32 ml/m  AORTIC VALVE                     PULMONIC VALVE AV Area (Vmax):    1.84 cm      PV Vmax:       0.66 m/s AV Area (Vmean):  1.68 cm      PV Peak grad:  1.7 mmHg AV Area (VTI):     1.70 cm AV Vmax:           154.67 cm/s AV Vmean:          100.600 cm/s AV VTI:            0.302 m AV Peak Grad:      9.6 mmHg AV Mean Grad:      4.7 mmHg LVOT Vmax:         90.40 cm/s LVOT Vmean:        53.750 cm/s LVOT VTI:          0.164 m LVOT/AV VTI ratio: 0.54  AORTA Ao Root diam: 3.60 cm Ao Asc diam:  3.90 cm MITRAL VALVE               TRICUSPID VALVE MV Area (PHT): 2.43 cm    TR Peak grad:   28.1 mmHg MV Area VTI:   2.49 cm    TR Vmax:        265.00 cm/s MV Peak grad:  2.6 mmHg MV Mean grad:  1.0 mmHg    SHUNTS MV Vmax:       0.81 m/s    Systemic VTI:  0.16 m MV Vmean:      33.2 cm/s   Systemic Diam: 2.00 cm MV Decel Time: 312 msec MV E velocity: 75.40 cm/s MV A velocity: 33.00 cm/s MV E/A ratio:  2.28 Oswaldo Milian MD Electronically signed by Oswaldo Milian MD Signature Date/Time: 08/27/2021/2:07:41 PM    Final    DG Chest Port 1 View  Result Date: 08/26/2021 CLINICAL DATA:  Shortness of breath EXAM: PORTABLE CHEST 1 VIEW COMPARISON:  10/27/2020 FINDINGS: Cardiomegaly. Mild, diffuse bilateral interstitial pulmonary opacity. The visualized skeletal structures are unremarkable. IMPRESSION: Cardiomegaly with mild, diffuse bilateral interstitial pulmonary  opacity, likely edema. No focal airspace opacity. Electronically Signed   By: Delanna Ahmadi M.D.   On: 08/26/2021 18:05    Orson Eva, DO  [facility]  If 7PM-7AM, please contact night-coverage [email or web address] Password [email or web address] 08/31/2021, 12:22 PM   LOS: 5 days

## 2021-08-31 NOTE — Progress Notes (Signed)
Progress Note  Patient Name: Dominic Hodges Date of Encounter: 08/31/2021  [facility] Cardiologist: Carlyle Dolly, MD   Subjective   Breathing improving  Inpatient Medications    Scheduled Meds:  aspirin  81 mg Per Tube Daily   Chlorhexidine Gluconate Cloth  6 each Topical Daily   famotidine  20 mg Oral QHS   fluorometholone  1 drop Both Eyes BID   free water  200 mL Per Tube TID AC & HS   heparin  5,000 Units Subcutaneous Q8H   ipratropium-albuterol  3 mL Nebulization Q6H WA   mouth rinse  15 mL Mouth Rinse 4 times per day   Continuous Infusions:  cefTRIAXone (ROCEPHIN)  IV 1 g (08/30/21 2041)   feeding supplement (OSMOLITE 1.5 CAL) 25 mL/hr at 08/31/21 0550   PRN Meds: acetaminophen **OR** acetaminophen, albuterol, morphine injection, ondansetron **OR** ondansetron (ZOFRAN) IV, mouth rinse, oxyCODONE, polyethylene glycol   Vital Signs    Vitals:   08/30/21 2200 08/30/21 2300 08/31/21 0251 08/31/21 0300  BP: (!) 138/54 (!) 151/64    Pulse: 70 71    Resp: (!) 34 (!) 25    Temp:  97.9 F (36.6 C)  98.1 F (36.7 C)  TempSrc:  Axillary  Axillary  SpO2: 99% 96% 96%   Weight:    75.6 kg  Height:        Intake/Output Summary (Last 24 hours) at 08/31/2021 0820 Last data filed at 08/31/2021 0550 Gross per 24 hour  Intake 1727.88 ml  Output 1475 ml  Net 252.88 ml      08/31/2021    3:00 AM 08/30/2021    3:51 AM 08/29/2021    3:36 AM  Last 3 Weights  Weight (lbs) 166 lb 10.7 oz 170 lb 13.7 oz 172 lb 13.5 oz  Weight (kg) 75.6 kg 77.5 kg 78.4 kg      Telemetry    Rate controlled afib - Personally Reviewed  ECG    N/a - Personally Reviewed  Physical Exam   GEN: No acute distress.   Neck: No JVD Cardiac: irreg Respiratory: Clear to auscultation bilaterally. GI: Soft, nontender, non-distended  MS: No edema; No deformity. Neuro:  Nonfocal  Psych: Normal affect   Labs    High Sensitivity Troponin:   Recent Labs  Lab 08/26/21 1747  08/26/21 1944 08/27/21 0533  TROPONINIHS 62* 70* 99*     Chemistry Recent Labs  Lab 08/26/21 1747 08/27/21 0533 08/28/21 0437 08/29/21 0513 08/30/21 0330 08/31/21 0309  NA 121* 128*   < > 135 138 139  K 4.5 3.7   < > 3.4* 3.6 3.2*  CL 85* 87*   < > 89* 88* 90*  CO2 27 31   < > 36* 37* 36*  GLUCOSE 228* 96   < > 88 78 152*  BUN 36* 33*   < > 34* 38* 38*  CREATININE 0.95 0.94   < > 1.13 1.29* 1.01  CALCIUM 7.9* 8.2*   < > 8.5* 8.8* 8.9  MG  --  2.2  --   --   --  2.3  PROT 6.7 6.2*  --   --   --   --   ALBUMIN 3.7 3.4*  --   --   --   --   AST 20 20  --   --   --   --   ALT 21 19  --   --   --   --   [facility]  74 67  --   --   --   --   BILITOT 0.6 0.8  --   --   --   --   GFRNONAA >60 >60   < > >60 52* >60  ANIONGAP 9 10   < > '10 13 13   '$ < > = values in this interval not displayed.    Lipids No results for input(s): "CHOL", "TRIG", "HDL", "LABVLDL", "LDLCALC", "CHOLHDL" in the last 168 hours.  Hematology Recent Labs  Lab 08/26/21 1747 08/27/21 0533  WBC 8.2 8.9  RBC 4.20* 4.23  HGB 13.2 13.3  HCT 38.0* 38.4*  MCV 90.5 90.8  MCH 31.4 31.4  MCHC 34.7 34.6  RDW 13.3 13.2  PLT 217 203   Thyroid  Recent Labs  Lab 08/27/21 0533  TSH 6.083*  FREET4 1.30*    BNP Recent Labs  Lab 08/26/21 1747 08/28/21 0437 08/29/21 0513  BNP 873.0* 435.0* 652.0*    DDimer No results for input(s): "DDIMER" in the last 168 hours.   Radiology    DG Abd 1 View  Result Date: 08/29/2021 CLINICAL DATA:  Abdominal pain rule out ileus EXAM: ABDOMEN - 1 VIEW COMPARISON:  Abdomen 08/27/2021 FINDINGS: Colonic dilatation mildly improved.  No small bowel dilatation. Gastrostomy tube overlies the stomach unchanged. Lumbar scoliosis.  Bilateral hip replacement IMPRESSION: Mild improvement in diffuse colonic dilatation compatible with ileus. Electronically Signed   By: Franchot Gallo M.D.   On: 08/29/2021 14:59   DG CHEST PORT 1 VIEW  Result Date: 08/29/2021 CLINICAL DATA:  Shortness of  breath EXAM: PORTABLE CHEST 1 VIEW COMPARISON:  Previous studies including the examination of 08/28/2021 FINDINGS: Transverse diameter of heart is increased. There are no signs of pulmonary edema. There is poor inspiration. Increased markings are seen in the lower lung fields, more so on the left side with no significant interval change. Lateral CP angles are clear. There is no pneumothorax. Degenerative changes are noted in both shoulders, more so on the right side. IMPRESSION: Patchy infiltrates are seen in both lower lung fields, more so on the left side suggesting atelectasis/pneumonia. Part of this finding may suggest underlying scarring. No significant interval changes are noted. Electronically Signed   By: Elmer Picker M.D.   On: 08/29/2021 09:33    Cardiac Studies     Patient Profile     Dominic Hodges is a 88 y.o. male with a hx of HTN, dysphagia who is being seen 08/28/2021 for the evaluation of new CHF at the request of Dr. Roger Shelter.  Assessment & Plan    Acute combined systolic/diastolic HF - echo LVE 47-09%, indet diastolic dysfunction but severe LAE would suggest long standing elevated LA pressures - CXR pulm edema, BNP 873   - 372  mL yesterday, neg 9.1 L since admission. Cr uptrend yesterday, IV lasix stopped after AM dose, Cr trending back down.  -had been on oral torsemide '10mg'$  at home and presented massively fluid overlaoded, change to '20mg'$  bid.   - soft bp's at times, started on midodrine by primary team now weaned off. Limits HF meds, overall severely debilitated with dysphagia/PEG tube and decline, being evaluated by palliative. Limited benefits in general to cardiac regimen, had been on farxiga but appears discontinued by pharmacy, checking on what the issue may have been.  - depending on clinical course at f/u could reconsider medication regimen    2. New onset afib - developed during admission, rate controlled without medications.  -  seen by Dr Marisue Ivan  yesterday with talks with patient and family about anticoagulation, family was to consider and make final decision based on overall prognosis and risk vs benefit evaluation - if family agrees to anticoagulation would recommend eliquis '5mg'$  bid.     3. Ileus - per primary team   4. Dysphagia/PEG tube    We will sign of inpatient care. If patient and family decide to start anticoagulation would start eliquis '5mg'$  bid. We will arrange outpatient f/u   For questions or updates, please contact [place] Please consult [email or web address] for contact info under        Signed, Carlyle Dolly, MD  08/31/2021, 8:20 AM

## 2021-08-31 NOTE — TOC Progression Note (Signed)
Transition of Care [facility]) - Progression Note    Patient Details  Name: Darnelle Corp MRN: [id number] Date of Birth: [birth year]  [facility] of Care [facility]) CM/SW Contact  Boneta Lucks, RN Phone Number: 08/31/2021, 10:33 AM  Clinical Narrative:   [facility] made a bed offer and can take him over the weekend or when medically ready. Daughter, [name] updated.   Expected Discharge Plan: [place] Barriers to Discharge: Continued Medical Work up  Expected Discharge Plan and Services Expected Discharge Plan: [place] arrangements for the past 2 months: Single Family Home                    Readmission Risk Interventions    08/30/2021    4:00 PM  Readmission Risk Prevention Plan  Transportation Screening Complete  PCP or Specialist Appt within 5-7 Days Not Complete  Home Care Screening Complete  Medication Review (RN CM) Complete

## 2021-09-01 DIAGNOSIS — R1312 Dysphagia, oropharyngeal phase: Secondary | ICD-10-CM | POA: Diagnosis not present

## 2021-09-01 DIAGNOSIS — I5021 Acute systolic (congestive) heart failure: Secondary | ICD-10-CM | POA: Diagnosis not present

## 2021-09-01 DIAGNOSIS — E871 Hypo-osmolality and hyponatremia: Secondary | ICD-10-CM | POA: Diagnosis not present

## 2021-09-01 DIAGNOSIS — J9601 Acute respiratory failure with hypoxia: Secondary | ICD-10-CM | POA: Diagnosis not present

## 2021-09-01 DIAGNOSIS — K567 Ileus, unspecified: Secondary | ICD-10-CM

## 2021-09-01 LAB — BASIC METABOLIC PANEL
Anion gap: 9 (ref 5–15)
BUN: 37 mg/dL — ABNORMAL HIGH (ref 8–23)
CO2: 35 mmol/L — ABNORMAL HIGH (ref 22–32)
Calcium: 8.9 mg/dL (ref 8.9–10.3)
Chloride: 92 mmol/L — ABNORMAL LOW (ref 98–111)
Creatinine, Ser: 1.16 mg/dL (ref 0.61–1.24)
GFR, Estimated: 59 mL/min — ABNORMAL LOW (ref >60)
Glucose, Bld: 87 mg/dL (ref 70–99)
Potassium: 4.2 mmol/L (ref 3.5–5.1)
Sodium: 136 mmol/L (ref 135–145)

## 2021-09-01 LAB — GLUCOSE, CAPILLARY
Glucose-Capillary: 101 mg/dL — ABNORMAL HIGH (ref 70–99)
Glucose-Capillary: 103 mg/dL — ABNORMAL HIGH (ref 70–99)
Glucose-Capillary: 104 mg/dL — ABNORMAL HIGH (ref 70–99)
Glucose-Capillary: 104 mg/dL — ABNORMAL HIGH (ref 70–99)
Glucose-Capillary: 109 mg/dL — ABNORMAL HIGH (ref 70–99)
Glucose-Capillary: 149 mg/dL — ABNORMAL HIGH (ref 70–99)
Glucose-Capillary: 173 mg/dL — ABNORMAL HIGH (ref 70–99)

## 2021-09-01 LAB — MAGNESIUM: Magnesium: 2.3 mg/dL (ref 1.7–2.4)

## 2021-09-01 MED ORDER — POLYETHYLENE GLYCOL 3350 17 G PO PACK
17.0000 g | PACK | Freq: Every day | ORAL | Status: DC
  Administered 2021-09-02: 17 g
  Filled 2021-09-01: qty 1

## 2021-09-01 MED ORDER — POLYETHYLENE GLYCOL 3350 17 G PO PACK
17.0000 g | PACK | Freq: Once | ORAL | Status: AC
Start: 2021-09-01 — End: 2021-09-01
  Administered 2021-09-01: 17 g
  Filled 2021-09-01: qty 1

## 2021-09-01 NOTE — Progress Notes (Signed)
PROGRESS NOTE  Dominic Hodges NAT:[id number] DOB: 1927-07-10 DOA: 08/26/2021 PCP: Celene Squibb, MD  Brief History:  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 male with medical history significant of acid reflux, BPH, hyperlipidemia, hypertension, presents ED with a chief complaint of peripheral edema, generalized weakness, and dyspnea.   Patient's daughter provides most of the history.  She reports that patient has been getting heavier over the last week.  Its become a problem because his caretakers can no longer lift him to transfer him to commode.  He is also been progressively more short of breath.  He was working harder to breathe today.  This is why he came into the ER.  They report patient has had rattling breath sounds for quite some time and they have been under the impression that small upper airway.  Patient has dysphagia and is NPO.  He has had a PEG tube chronically.  Today they had him evaluated by hospice nurse.  It was the hospice nurse that also advised him to come into the ER when she saw how edematous, dyspneic, and diaphoretic patient was.  They were told that he cannot be in hospice as he does not have a diagnosis that meets the criteria.   Daughter is interested in palliative care.  Patient has not complained of any chest pain or palpitations.  He has no further complaints at this time.   Continues on PEG tube feedings.  He takes 1.2 kcals Jevity.  He needs 2 cartons at 8 AM, noon, 4 and 1 carton at 8 PM.  He has 300 cc of free water flush with each feeding.   ED: Temp 98.1, heart rate 90-100, respiratory rate 26-40, blood pressure 133/72-174/89 No leukocytosis at 8.2, hemoglobin 13.2, platelets 217 Chemistry reveals a hyponatremia and a hyperglycemia, he is still hyponatremic after correction BNP [address] initially 62, and then 70 -Chest x-ray shows cardiomegaly, interstitial pulmonary edema EKG shows sinus rhythm, QTc 488, heart rate 94 Lasix 40 mg IV given  x1 DuoNeb given -Foley placed Admission requested for CHF exacerbation       Assessment and Plan: * Acute systolic CHF (congestive heart failure) ([place]) Appreciate cardiology follow up -IV lasix transitioned to po torsemide 20 mg bid -not a candidate for aggressive therapies/invasive angiography -08/27/21 Echo EF 45-50%, global HK NEG 10.1 L BP remains soft, but improving  Ileus ([place]) 4/22 KUB--Mild improvement in diffuse colonic dilatation compatible with Ileus. -abd remains mildly distended -8/24 CT abd/pelvis--Distended and markedly redundant sigmoid colon with no focal transition point, possible [facility] consult appreciated>>felt to be due to constipation>>started cathartics --restart enteral feeding  Acute respiratory failure with hypoxia ([place]) - Patient acute respiratory failure due to CHF -Patient initially required BiPAP ABG    Component Value Date/Time   PHART 7.49 (H) 08/29/2021 0951   PCO2ART 56 (H) 08/29/2021 0951   PO2ART 79 (L) 08/29/2021 0951   HCO3 42.7 (H) 08/29/2021 0951   O2SAT 98.2 08/29/2021 0951   Now weaned to 2L>>remains stable  Hypotensive episode -BP improved and stable albeit soft -Discontinue clonidine patch -Started midodrine 5 mg p.o. 3 times daily>>> BP stable>>stopped -Status post repleting  Albumin 25 g x 1 08/27/21 -Monitoring closely  Cellulitis -Expected much improved Left lower extremity edema erythema -Was on doxycycline as an outpatient with no improvement   -On admission patient was started on Rocephin - Continue -Pro-Cal less than 0.10, WBC 8.9,  New onset atrial fibrillation ([place]) -developed during admission, rate controlled without medications.  - seen by Dr Marisue Ivan yesterday with talks with patient and family about anticoagulation, family was to consider and make final decision based on overall prognosis and risk vs benefit evaluation -discussed risks/benefits with pt and daughter>>want to start apixaban -rate  controlled  Goals of care, counseling/discussion Appreciate palliative consult Continue full scope of care Plan d/c to SNF with palliative following  Elevated troponin - Demand ischemia in the setting of hypoxia and CHF exacerbation - I-troponin 62, 70, 99 -Denies any chest pain  - Echo: LVE 69-67%, indet diastolic dysfunction but severe LAE would suggest long standing elevated LA pressures   -Monitor on telemetry  GERD (gastroesophageal reflux disease) -Continue pepcid  Generalized weakness -Severe global weakness, with severe debility -Ambulates with assist only -deconditioning and chronic illness -pt eval and treat>>SNF  Hyponatremia -Na 121, (previously 137) >>> 128 >>132, 135  -Hypervolemic hyponatremia -improved with diuresis  Dysphagia, oropharyngeal -NPO  -PEG tube in place, continue tube feeds when ileus improved -dietician consult -at home he does 2 cartons of jevity at 8a, 12p, 4p, and one carton at 8p of jevity -He received 371m free water flushes with each feed -continue osmolite 1.5 while inpatient  Essential hypertension, benign -BP has much improved - was Hypotensive, discontinude clonidine -On midodrine---discontinued 08/29/2021     Family Communication:   Family at bedside updated 8/25  Consultants:  GI, cardiology  Code Status:  FULL   DVT Prophylaxis:  apixaban   Procedures: As Listed in Progress Note Above  Antibiotics: Ceftriaxone 8/19>>8/25       Subjective: Had one BM today.  Denies cp, sob, n/v/d, abd pain  Objective: Vitals:   09/01/21 0235 09/01/21 0606 09/01/21 0911 09/01/21 1338  BP:  110/70    Pulse:  62    Resp:  19    Temp:  98.8 F (37.1 C)    TempSrc:  Oral    SpO2: 92% 96% 95% 92%  Weight:  75.5 kg    Height:        Intake/Output Summary (Last 24 hours) at 09/01/2021 1420 Last data filed at 09/01/2021 0500 Gross per 24 hour  Intake --  Output 1150 ml  Net -1150 ml   Weight change: -0.1  kg Exam:  General:  Pt is alert, follows commands appropriately, not in acute distress HEENT: No icterus, No thrush, No neck mass, [place]/AT Cardiovascular: IRRR, S1/S2, no rubs, no gallops Respiratory: bibasilar, no wheezing, no crackles, no rhonchi Abdomen: Soft/+BS, non tender, non distended, no guarding Extremities: No edema, No lymphangitis, No petechiae, No rashes, no synovitis   Data Reviewed: I have personally reviewed following labs and imaging studies Basic Metabolic Panel: Recent Labs  Lab 08/27/21 0533 08/28/21 0437 08/29/21 0513 08/30/21 0330 08/31/21 0309 09/01/21 0309  NA 128* 132* 135 138 139 136  K 3.7 3.5 3.4* 3.6 3.2* 4.2  CL 87* 87* 89* 88* 90* 92*  CO2 31 33* 36* 37* 36* 35*  GLUCOSE 96 106* 88 78 152* 87  BUN 33* 36* 34* 38* 38* 37*  CREATININE 0.94 1.03 1.13 1.29* 1.01 1.16  CALCIUM 8.2* 8.3* 8.5* 8.8* 8.9 8.9  MG 2.2  --   --   --  2.3 2.3   Liver Function Tests: Recent Labs  Lab 08/26/21 1747 08/27/21 0533  AST 20 20  ALT 21 19  ALKPHOS 74 67  BILITOT 0.6 0.8  PROT 6.7 6.2*  ALBUMIN 3.7 3.4*  No results for input(s): "LIPASE", "AMYLASE" in the last 168 hours. No results for input(s): "AMMONIA" in the last 168 hours. Coagulation Profile: No results for input(s): "INR", "PROTIME" in the last 168 hours. CBC: Recent Labs  Lab 08/26/21 1747 08/27/21 0533  WBC 8.2 8.9  NEUTROABS 7.1 6.8  HGB 13.2 13.3  HCT 38.0* 38.4*  MCV 90.5 90.8  PLT 217 203   Cardiac Enzymes: No results for input(s): "CKTOTAL", "CKMB", "CKMBINDEX", "TROPONINI" in the last 168 hours. BNP: Invalid input(s): "POCBNP" CBG: Recent Labs  Lab 08/31/21 1949 09/01/21 0054 09/01/21 0405 09/01/21 0731 09/01/21 1113  GLUCAP 107* 109* 104* 103* 101*   HbA1C: No results for input(s): "HGBA1C" in the last 72 hours. Urine analysis:    Component Value Date/Time   COLORURINE YELLOW 03/14/2020 1600   APPEARANCEUR HAZY (A) 03/14/2020 1600   LABSPEC 1.018 03/14/2020  1600   PHURINE 5.0 03/14/2020 1600   GLUCOSEU NEGATIVE 03/14/2020 1600   HGBUR NEGATIVE 03/14/2020 1600   BILIRUBINUR NEGATIVE 03/14/2020 1600   KETONESUR NEGATIVE 03/14/2020 1600   PROTEINUR 30 (A) 03/14/2020 1600   NITRITE NEGATIVE 03/14/2020 1600   LEUKOCYTESUR NEGATIVE 03/14/2020 1600   Sepsis Labs: '@LABRCNTIP'$ (procalcitonin:4,lacticidven:4) ) Recent Results (from the past 240 hour(s))  MRSA Next Gen by PCR, Nasal     Status: None   Collection Time: 08/26/21  8:39 PM   Specimen: Nasal Mucosa; Nasal Swab  Result Value Ref Range Status   MRSA by PCR Next Gen NOT DETECTED NOT DETECTED Final    Comment: (NOTE) The GeneXpert MRSA Assay (FDA approved for NASAL specimens only), is one component of a comprehensive MRSA colonization surveillance program. It is not intended to diagnose MRSA infection nor to guide or monitor treatment for MRSA infections. Test performance is not FDA approved in patients less than 22 years old. Performed at [facility], [address], [place], [place] [address]      Scheduled Meds:  apixaban  5 mg Per Tube BID   aspirin  81 mg Per Tube Daily   famotidine  20 mg Oral QHS   fluorometholone  1 drop Both Eyes BID   free water  200 mL Per Tube TID AC & HS   ipratropium-albuterol  3 mL Nebulization Q6H WA   mouth rinse  15 mL Mouth Rinse 4 times per day   [START ON 09/02/2021] polyethylene glycol  17 g Per Tube Daily   polyethylene glycol  17 g Per Tube Once   torsemide  20 mg Per Tube BID   Continuous Infusions:  feeding supplement (OSMOLITE 1.5 CAL) 25 mL/hr at 08/31/21 0550    Procedures/Studies: CT ABDOMEN PELVIS WO CONTRAST  Result Date: 08/31/2021 CLINICAL DATA:  Concern for bowel obstruction EXAM: CT ABDOMEN AND PELVIS WITHOUT CONTRAST TECHNIQUE: Multidetector CT imaging of the abdomen and pelvis was performed following the standard protocol without IV contrast. RADIATION DOSE REDUCTION: This exam was performed according to the departmental  dose-optimization program which includes automated exposure control, adjustment of the mA and/or kV according to patient size and/or use of iterative reconstruction technique. COMPARISON:  None Available. FINDINGS: Lower chest: Bibasilar atelectasis. Mild cardiomegaly and coronary artery calcifications. Hepatobiliary: Scattered low-attenuation liver lesions which are too small to completely characterize but likely simple cysts. No suspicious liver lesion. Gallbladder is unremarkable. No biliary ductal dilation. Pancreas: Cystic lesion of the uncinate process of the pancreas measuring 1.4 cm, unchanged when compared with prior exam. No evidence of main pancreatic duct dilation. Spleen: Normal in size  without focal abnormality. Adrenals/Urinary Tract: Bilateral adrenal glands are unremarkable. No hydronephrosis. Bilateral cystic renal lesions, including an unchanged hyperdense lesion of the right kidney located on series 3, image 38 which is likely a proteinaceous cyst. Cyst with few calcified septa of the left kidney (Bosniak II), no specific follow-up imaging is required. Bladder is unremarkable. Stomach/Bowel: Gastrostomy tube in place. Diverticulosis. Markedly redundant sigmoid colon with gaseous distention. Mild focal fat stranding of the sigmoid colon seen on series 6, image 48. No evidence of bowel wall thickening. Vascular/Lymphatic: Aortic atherosclerosis. No enlarged abdominal or pelvic lymph nodes. Reproductive: Prostatomegaly, measuring up to 5.6 cm. Other: Moderate left and small right fat containing inguinal hernias. Musculoskeletal: Bilateral total hip arthroplasties. Unchanged mild compression deformity of T11. New age indeterminate compression deformity of T12. IMPRESSION: 1. 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 2. Extensive diverticulosis with mild focal fat stranding of the sigmoid  colon, concerning for early acute diverticulitis. 3. 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 Recommend follow-up MRCP in 2 years. 4. New age indeterminate compression deformity of T12. Correlate for point tenderness. 5.  Aortic Atherosclerosis (ICD10-I70.0). Electronically Signed   By: Yetta Glassman M.D.   On: 08/31/2021 17:13   DG Abd 1 View  Result Date: 08/29/2021 CLINICAL DATA:  Abdominal pain rule out ileus EXAM: ABDOMEN - 1 VIEW COMPARISON:  Abdomen 08/27/2021 FINDINGS: Colonic dilatation mildly improved.  No small bowel dilatation. Gastrostomy tube overlies the stomach unchanged. Lumbar scoliosis.  Bilateral hip replacement IMPRESSION: Mild improvement in diffuse colonic dilatation compatible with ileus. Electronically Signed   By: Franchot Gallo M.D.   On: 08/29/2021 14:59   DG CHEST PORT 1 VIEW  Result Date: 08/29/2021 CLINICAL DATA:  Shortness of breath EXAM: PORTABLE CHEST 1 VIEW COMPARISON:  Previous studies including the examination of 08/28/2021 FINDINGS: Transverse diameter of heart is increased. There are no signs of pulmonary edema. There is poor inspiration. Increased markings are seen in the lower lung fields, more so on the left side with no significant interval change. Lateral CP angles are clear. There is no pneumothorax. Degenerative changes are noted in both shoulders, more so on the right side. IMPRESSION: Patchy infiltrates are seen in both lower lung fields, more so on the left side suggesting atelectasis/pneumonia. Part of this finding may suggest underlying scarring. No significant interval changes are noted. Electronically Signed   By: Elmer Picker M.D.   On: 08/29/2021 09:33   DG CHEST PORT 1 VIEW  Result Date: 08/28/2021 CLINICAL DATA:  Cough, shortness of breath EXAM: PORTABLE CHEST 1 VIEW COMPARISON:  08/26/2021 FINDINGS: Stable cardiomediastinal contours. Aortic atherosclerosis.  Low lung volumes. Mildly prominent interstitial markings, most pronounced in the left lung base, similar to prior. No effusion or pneumothorax. IMPRESSION: Stable chest radiograph with low lung volumes and persistent mildly prominent interstitial markings, most pronounced in the left lung base. Electronically Signed   By: Davina Poke D.O.   On: 08/28/2021 10:14   DG Abd 1 View  Result Date: 08/27/2021 CLINICAL DATA:  Abdominal distention EXAM: ABDOMEN - 1 VIEW COMPARISON:  None Available. FINDINGS: Gastrostomy is seen in upper abdomen. There is no significant small bowel dilation. Stomach is not distended. There is gaseous distention of right colon. There is no distention of left colon. Colon is interposed between liver and abdominal wall. Patchy densities are seen in the  left lower lung field. There is previous arthroplasty in both hips. Degenerative changes are noted in lumbar spine. IMPRESSION: There is a moderate to marked gaseous distention of right colon measuring up to 10.9 cm in diameter. This may suggest ileus or partial colonic obstruction. There is no significant small bowel dilation. Increased markings in left lower lung field suggest scarring or atelectasis or pneumonia. Electronically Signed   By: Elmer Picker M.D.   On: 08/27/2021 21:04   ECHOCARDIOGRAM COMPLETE  Result Date: 08/27/2021    ECHOCARDIOGRAM REPORT   Patient Name:   DR. Brooke Bonito Date of Exam: 08/27/2021 Medical Rec #:  [id number]              Height:       77.0 in Accession #:    [id number]             Weight:       165.6 lb Date of Birth:  10/31/1927              BSA:          2.064 m Patient Age:    74 years               BP:           94/46 mmHg Patient Gender: M                      HR:           83 bpm. Exam Location:  [facility] Procedure: 2D Echo, Cardiac Doppler and Color Doppler Indications:    CHF  History:        Patient has no prior history of Echocardiogram examinations.                 CHF; Risk  Factors:Hypertension and Dyslipidemia.  Sonographer:    Wenda Low Referring Phys: [id number] ASIA B McMillin  Sonographer Comments: Suboptimal subcostal window. IMPRESSIONS  1. Left ventricular ejection fraction, by estimation, is 45 to 50%. The left ventricle has mildly decreased function. The left ventricle demonstrates global hypokinesis. There is moderate asymmetric left ventricular hypertrophy of the basal-septal segment. Left ventricular diastolic parameters are indeterminate.  2. Right ventricular systolic function is mildly reduced. The right ventricular size is mildly enlarged.  3. Left atrial size was severely dilated.  4. The mitral valve is normal in structure. Trivial mitral valve regurgitation. No evidence of mitral stenosis.  5. The aortic valve is tricuspid. Aortic valve regurgitation is trivial. Aortic valve sclerosis/calcification is present, without any evidence of aortic stenosis.  6. Aortic dilatation noted. There is mild dilatation of the ascending aorta, measuring 39 mm. FINDINGS  Left Ventricle: Left ventricular ejection fraction, by estimation, is 45 to 50%. The left ventricle has mildly decreased function. The left ventricle demonstrates global hypokinesis. The left ventricular internal cavity size was normal in size. There is  moderate asymmetric left ventricular hypertrophy of the basal-septal segment. Left ventricular diastolic parameters are indeterminate. Right Ventricle: The right ventricular size is mildly enlarged. Right vetricular wall thickness was not well visualized. Right ventricular systolic function is mildly reduced. The tricuspid regurgitant velocity is 2.65 m/s, and with an assumed right atrial pressure of 8 mmHg, the estimated right ventricular systolic pressure is 38.1 mmHg. Left Atrium: Left atrial size was severely dilated. Right Atrium: Right atrial size was normal in size. Pericardium: There is no evidence of pericardial effusion. Mitral Valve: The mitral  valve is normal  in structure. Trivial mitral valve regurgitation. No evidence of mitral valve stenosis. MV peak gradient, 2.6 mmHg. The mean mitral valve gradient is 1.0 mmHg. Tricuspid Valve: The tricuspid valve is normal in structure. Tricuspid valve regurgitation is trivial. Aortic Valve: The aortic valve is tricuspid. Aortic valve regurgitation is trivial. Aortic valve sclerosis/calcification is present, without any evidence of aortic stenosis. Aortic valve mean gradient measures 4.7 mmHg. Aortic valve peak gradient measures 9.6 mmHg. Aortic valve area, by VTI measures 1.70 cm. Pulmonic Valve: The pulmonic valve was not well visualized. Pulmonic valve regurgitation is trivial. Aorta: The aortic root is normal in size and structure and aortic dilatation noted. There is mild dilatation of the ascending aorta, measuring 39 mm. IAS/Shunts: The interatrial septum was not well visualized.  LEFT VENTRICLE PLAX 2D LVIDd:         5.00 cm   Diastology LVIDs:         3.70 cm   LV e' lateral:   8.38 cm/s LV PW:         1.60 cm   LV E/e' lateral: 9.0 LV IVS:        1.15 cm LVOT diam:     2.00 cm LV SV:         52 LV SV Index:   25 LVOT Area:     3.14 cm  RIGHT VENTRICLE RV Basal diam:  4.40 cm RV Mid diam:    3.30 cm RV S prime:     11.60 cm/s LEFT ATRIUM              Index        RIGHT ATRIUM           Index LA diam:        5.30 cm  2.57 cm/m   RA Area:     21.40 cm LA Vol (A2C):   120.0 ml 58.13 ml/m  RA Volume:   59.90 ml  29.02 ml/m LA Vol (A4C):   89.7 ml  43.45 ml/m LA Biplane Vol: 108.0 ml 52.32 ml/m  AORTIC VALVE                     PULMONIC VALVE AV Area (Vmax):    1.84 cm      PV Vmax:       0.66 m/s AV Area (Vmean):   1.68 cm      PV Peak grad:  1.7 mmHg AV Area (VTI):     1.70 cm AV Vmax:           154.67 cm/s AV Vmean:          100.600 cm/s AV VTI:            0.302 m AV Peak Grad:      9.6 mmHg AV Mean Grad:      4.7 mmHg LVOT Vmax:         90.40 cm/s LVOT Vmean:        53.750 cm/s LVOT VTI:           0.164 m LVOT/AV VTI ratio: 0.54  AORTA Ao Root diam: 3.60 cm Ao Asc diam:  3.90 cm MITRAL VALVE               TRICUSPID VALVE MV Area (PHT): 2.43 cm    TR Peak grad:   28.1 mmHg MV Area VTI:   2.49 cm    TR Vmax:        265.00 cm/s MV  Peak grad:  2.6 mmHg MV Mean grad:  1.0 mmHg    SHUNTS MV Vmax:       0.81 m/s    Systemic VTI:  0.16 m MV Vmean:      33.2 cm/s   Systemic Diam: 2.00 cm MV Decel Time: 312 msec MV E velocity: 75.40 cm/s MV A velocity: 33.00 cm/s MV E/A ratio:  2.28 Oswaldo Milian MD Electronically signed by Oswaldo Milian MD Signature Date/Time: 08/27/2021/2:07:41 PM    Final    DG Chest Port 1 View  Result Date: 08/26/2021 CLINICAL DATA:  Shortness of breath EXAM: PORTABLE CHEST 1 VIEW COMPARISON:  10/27/2020 FINDINGS: Cardiomegaly. Mild, diffuse bilateral interstitial pulmonary opacity. The visualized skeletal structures are unremarkable. IMPRESSION: Cardiomegaly with mild, diffuse bilateral interstitial pulmonary opacity, likely edema. No focal airspace opacity. Electronically Signed   By: Delanna Ahmadi M.D.   On: 08/26/2021 18:05    Orson Eva, DO  [facility]  If 7PM-7AM, please contact night-coverage [email or web address] Password [email or web address] 09/01/2021, 2:20 PM   LOS: 6 days

## 2021-09-01 NOTE — Care Management Important Message (Signed)
Important Message  Patient Details  Name: Dominic Hodges MRN: [id number] Date of Birth: November 08, 1927   Medicare Important Message Given:  Yes     [name] 09/01/2021, 10:41 AM

## 2021-09-01 NOTE — Consult Note (Signed)
Gastroenterology Consult   Referring Provider: No ref. provider found Primary Care Physician:  Dominic Squibb, MD Primary Gastroenterologist:  previously Dr. Laural Hodges  Patient ID: Dominic Hodges; [id number]; 1927/08/04   Admit date: 08/26/2021  LOS: 6 days   Date of Consultation: 09/01/2021  Reason for Consultation:  ileus vs Ogilvie's  History of Present Illness   Dominic Hodges is a 88 y.o. year old male with history of GERD, BPH, HLD, HTN, and dysphagia with PEG tube who presented to the ED on 08/26/2021 with peripheral edema, generalized weakness, dyspnea.  Patient's daughter reported he has been progressively short of breath and was evaluated by home hospice in which the nurse advised for him to come to the ED due to his symptoms and stating that he is not a hospice candidate.  GI consulted for abdominal distention and CT imaging with evidence of possible ileus versus Ogilvie's.    ED course: Temperature 98.1, heart rate 90-100, respiratory rate 26-40, stable BP. WBC 8.2, hemoglobin 13.2, platelets 217 CMP sodium 121, glucose 228, BUN 36 BNP 873, and slightly elevated troponins CXR with cardiomegaly and interstitial pulmonary edema He was treated with Lasix IV and given DuoNeb.  Foley also placed   Is being treated for acute systolic heart failure currently.  A KUB was obtained on 8/20 no ring moderate to marked gaseous distention of the right colon measuring up to 10.9 cm in diameter suggesting ileus or partial colonic obstruction without small bowel dilation.  Repeat KUB on 08/29/2021 noting mild improvement in diffuse colonic dilatation compatible with ileus.  Given ongoing abdominal distention CT A/P was performed 8/24 noting markedly redundant sigmoid colon with gaseous distention and mild focal fat stranding in the sigmoid colon without evidence of bowel wall thickening.  Also with cystic lesion of uncinate process of the pancreas measuring 1.4 cm, unchanged from prior exam  and without pancreatic ductal dilation.  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  He also has extensive diverticulosis and mild fat stranding concerning for early acute diverticulitis.  Also suspect IPMN and radiology recommends MRCP in 2 years.  Patient also has had new onset A-fib during this admission and has been started on Eliquis.  Plan of care is also following and currently plan is to continue full code and to DC to SNF for short-term rehab and to continue to be followed by palliative care.  Consult: He denies any abdominal pain, nausea, vomiting, melena, hematochezia. He reports he had a small bowel movement yesterday and a small one the day before that. He also reports passing flatus. He states that he does not feel as though his abdomen is distended and that it looks normal to him. I asked multiple times about any abdominal pain and he continues to deny. He reports he typically does not have significant issues with constipation and denies looser stools. No family at bedside during my exam. I offered to contact family to speak with them and he declined the need to speak with them or to update them.    Past Medical History:  Diagnosis Date   Acid reflux    mild-states if doesnt chew food completely he begins to cough- states has weakness in muscles due to ilieus- has been doing exreci this   Adynamic ileus ([facility]) 12/2014   ? anesthesia related per pt   BPH (benign prostatic hyperplasia)  Complication of anesthesia    10/11/15- per pt "had [place]- in 11/16-related it to possibly anesthesia"   H/O seasonal allergies    Hyperlipidemia    Hypertension     Past Surgical History:  Procedure Laterality Date   APPENDECTOMY     approx 30 years ago/lwb   CATARACT EXTRACTION W/PHACO Left 09/11/2012   Procedure: CATARACT EXTRACTION PHACO AND  INTRAOCULAR LENS PLACEMENT (Dominic Hodges);  Surgeon: Dominic Branch, MD;  Location: [facility];  Service: Ophthalmology;  Laterality: Left;  CDE: 19.52   ESOPHAGOGASTRODUODENOSCOPY (EGD) WITH PROPOFOL N/A 05/03/2020   Procedure: ESOPHAGOGASTRODUODENOSCOPY (EGD) WITH PROPOFOL;  Surgeon: Dominic Signs, MD;  Location: [facility];  Service: General;  Laterality: N/A;   HIP ARTHROPLASTY Right 12/06/2014   Procedure: PARTIAL RIGHT HIP REPLACEMENT;  Surgeon: Dominic Civil, MD;  Location: [facility];  Service: Orthopedics;  Laterality: Right;   HIP ARTHROPLASTY Left 03/08/2020   Procedure: ARTHROPLASTY BIPOLAR HIP (HEMIARTHROPLASTY);  Surgeon: Dominic Rasmussen, MD;  Location: [facility];  Service: Orthopedics;  Laterality: Left;   LUMBAR LAMINECTOMY/DECOMPRESSION MICRODISCECTOMY N/A 10/19/2015   Procedure: CENTRAL DECOMPRESSION LUMBAR LAMINECTOMY FOR SPINAL STENOSIS, L3,L4FORAMINOTOMY BILATERAL L3,L4 NERVE ROOT;  Surgeon: Dominic Maudlin, MD;  Location: [facility];  Service: Orthopedics;  Laterality: N/A;   PEG PLACEMENT N/A 05/03/2020   Procedure: PERCUTANEOUS ENDOSCOPIC GASTROSTOMY (PEG) PLACEMENT;  Surgeon: Dominic Signs, MD;  Location: [facility];  Service: General;  Laterality: N/A;   UMBILICAL HERNIA REPAIR     Approx 10 years ago/lwb   ureteral stone extraction      Prior to Admission medications   Medication Sig Start Date End Date Taking? Authorizing Provider  acetaminophen (TYLENOL) 160 MG/5ML elixir Place 325 mg into feeding tube every 6 (six) hours as needed for fever. Give 650 mg   Yes [provider]  aspirin EC 81 MG tablet Take 81 mg by mouth daily. Swallow whole.   Yes [provider]  cloNIDine (CATAPRES - DOSED IN MG/24 HR) 0.1 mg/24hr patch Place 1 patch (0.1 mg total) onto the skin once a week. Place on Fridays 05/24/20  Yes Gerlene Fee, NP  Probiotic Product (PROBIOTIC DAILY PO) Take by mouth. One daily   Yes [provider]  torsemide (DEMADEX) 10 MG tablet Take 10 mg by mouth daily.  04/11/21  Yes [provider]    Current Facility-Administered Medications  Medication Dose Route Frequency Provider Last Rate Last Admin   acetaminophen (TYLENOL) tablet 650 mg  650 mg Oral Q6H PRN Dominic Hodges, Asia B, DO       Or   acetaminophen (TYLENOL) suppository 650 mg  650 mg Rectal Q6H PRN Dominic Hodges, Asia B, DO       albuterol (PROVENTIL) (2.5 MG/3ML) 0.083% nebulizer solution 2.5 mg  2.5 mg Nebulization Q4H PRN Dominic Hodges, Asia B, DO   2.5 mg at 08/27/21 2110   apixaban (ELIQUIS) tablet 5 mg  5 mg Per Tube BID Tat, Shanon Brow, MD   5 mg at 08/31/21 2217   aspirin chewable tablet 81 mg  81 mg Per Tube Daily Dominic Hodges, Asia B, DO   81 mg at 08/31/21 0939   cefTRIAXone (ROCEPHIN) 1 g in sodium chloride 0.9 % 100 mL IVPB  1 g Intravenous Q24H Dominic Hodges, Asia B, DO 200 mL/hr at 08/31/21 2021 1 g at 08/31/21 2021   Chlorhexidine Gluconate Cloth 2 % PADS 6 each  6 each Topical Daily Dominic Hodges, Asia B, DO   6 each at 08/31/21 [phone]  famotidine (PEPCID) tablet 20 mg  20 mg Oral QHS Dominic Hodges, Asia B, DO   20 mg at 08/31/21 2216   feeding supplement (OSMOLITE 1.5 CAL) liquid 1,000 mL  1,000 mL Per Tube Continuous Deatra James, MD 25 mL/hr at 08/31/21 0550 Infusion Verify at 08/31/21 0550   fluorometholone (FML) 0.1 % ophthalmic suspension 1 drop  1 drop Both Eyes BID Dominic Hodges, Asia B, DO   1 drop at 08/31/21 7253   free water 200 mL  200 mL Per Tube TID AC & HS Dominic Hodges, Asia B, DO   200 mL at 08/31/21 2217   ipratropium-albuterol (DUONEB) 0.5-2.5 (3) MG/3ML nebulizer solution 3 mL  3 mL Nebulization Q6H WA Shahmehdi, Seyed A, MD   3 mL at 09/01/21 0234   morphine (PF) 2 MG/ML injection 2 mg  2 mg Intravenous Q2H PRN Dominic Hodges, Asia B, DO   2 mg at 08/29/21 2250   ondansetron (ZOFRAN) tablet 4 mg  4 mg Oral Q6H PRN Dominic Hodges, Asia B, DO       Or   ondansetron (ZOFRAN) injection 4 mg  4 mg Intravenous Q6H PRN Dominic Hodges, Asia B, DO       Oral care mouth  rinse  15 mL Mouth Rinse 4 times per day Tat, Shanon Brow, MD   15 mL at 08/31/21 2217   Oral care mouth rinse  15 mL Mouth Rinse PRN Tat, Shanon Brow, MD       oxyCODONE (Oxy IR/ROXICODONE) immediate release tablet 5 mg  5 mg Oral Q4H PRN Dominic Hodges, Asia B, DO       polyethylene glycol (MIRALAX / GLYCOLAX) packet 17 g  17 g Per Tube Daily PRN Dominic Hodges, Asia B, DO   17 g at 08/30/21 2144   torsemide (DEMADEX) tablet 20 mg  20 mg Per Tube BID Tat, Shanon Brow, MD   20 mg at 08/31/21 2020    Allergies as of 08/26/2021 - Review Complete 08/26/2021  Allergen Reaction Noted   Robaxin [methocarbamol] Other (See Comments) 10/07/2015    Family History  Problem Relation Age of Onset   Coronary artery disease Other        no family history of premature CAD    Social History   Socioeconomic History   Marital status: Married    Spouse name: Not on file   Number of children: Not on file   Years of education: Not on file   Highest education level: Not on file  Occupational History   Not on file  Tobacco Use   Smoking status: Former    Types: Pipe    Quit date: 01/09/1959    Years since quitting: 62.6   Smokeless tobacco: Never   Tobacco comments:    smoked a pipe not sure how long  Vaping Use   Vaping Use: Never used  Substance and Sexual Activity   Alcohol use: Yes    Comment: occasional social drink/lwb   Drug use: No   Sexual activity: Not on file  Other Topics Concern   Not on file  Social History Narrative   Not on file   Social Determinants of Health   Financial Resource Strain: Not on file  Food Insecurity: Not on file  Transportation Needs: Not on file  Physical Activity: Not on file  Stress: Not on file  Social Connections: Not on file  Intimate Partner Violence: Not on file     Review of Systems   Gen: Denies any fever, chills, loss of appetite, change in  weight or weight loss CV: Denies chest pain, heart palpitations, syncope, edema  Resp: Denies shortness of breath  with rest, cough, wheezing, coughing up blood, and pleurisy. GI: see HPI GU : Denies urinary burning, blood in urine, urinary frequency, and urinary incontinence. MS: Denies joint pain, limitation of movement, swelling, cramps, and atrophy.  Derm: Denies rash, itching, dry skin, hives. Psych: Denies depression, anxiety, memory loss, hallucinations, and confusion. Heme: Denies bruising or bleeding Neuro:  Denies any headaches, dizziness, paresthesias, shaking  Physical Exam   Vital Hodges in last 24 hours: Temp:  [97.8 F (36.6 C)-98.8 F (37.1 C)] 98.8 F (37.1 C) (08/25 0606) Pulse Rate:  [51-72] 62 (08/25 0606) Resp:  [16-20] 19 (08/25 0606) BP: (104-142)/(64-88) 110/70 (08/25 0606) SpO2:  [90 %-99 %] 96 % (08/25 0606) Weight:  [75.5 kg] 75.5 kg (08/25 0606) Last BM Date : 08/26/21  General:  Drowsy, frail appearing, pleasant and cooperative in NAD Head:  Normocephalic and atraumatic. Eyes:  Sclera clear, no icterus.   Conjunctiva pink. Ears:  Normal auditory acuity. Mouth:  Dry mucous membranes.  Neck:  Supple; no masses Lungs:  Clear throughout to auscultation.   No wheezes, crackles, or rhonchi. No acute distress. Heart:  Regular rate and rhythm; no murmurs, clicks, rubs,  or gallops. Abdomen:  Soft, non-tender, mild distention. Mild tympany to upper abdomen. No masses, hepatosplenomegaly or hernias noted. Normal bowel sounds, without guarding, and without rebound. Peg tube in place to LUQ with dry dressing in place.  Rectal: deferred Neurologic:  Alert and oriented x4. Slow to respond to questions.  Skin:  Intact without significant lesions or rashes. Psych:  Alert and cooperative. Normal mood and affect.  Intake/Output from previous day: 08/24 0701 - 08/25 0700 In: -  Out: 1150 [Urine:1150] Intake/Output this shift: No intake/output data recorded.   Labs/Studies   Recent Labs No results for input(s): "WBC", "HGB", "HCT", "PLT" in the last 72 hours. BMET Recent  Labs    08/30/21 0330 08/31/21 0309 09/01/21 0309  NA 138 139 136  K 3.6 3.2* 4.2  CL 88* 90* 92*  CO2 37* 36* 35*  GLUCOSE 78 152* 87  BUN 38* 38* 37*  CREATININE 1.29* 1.01 1.16  CALCIUM 8.8* 8.9 8.9   LFT No results for input(s): "PROT", "ALBUMIN", "AST", "ALT", "ALKPHOS", "BILITOT", "BILIDIR", "IBILI" in the last 72 hours. PT/INR No results for input(s): "LABPROT", "INR" in the last 72 hours. Hepatitis Panel No results for input(s): "HEPBSAG", "HCVAB", "HEPAIGM", "HEPBIGM" in the last 72 hours. C-Diff No results for input(s): "CDIFFTOX" in the last 72 hours.  Radiology/Studies CT ABDOMEN PELVIS WO CONTRAST  Result Date: 08/31/2021 CLINICAL DATA:  Concern for bowel obstruction EXAM: CT ABDOMEN AND PELVIS WITHOUT CONTRAST TECHNIQUE: Multidetector CT imaging of the abdomen and pelvis was performed following the standard protocol without IV contrast. RADIATION DOSE REDUCTION: This exam was performed according to the departmental dose-optimization program which includes automated exposure control, adjustment of the mA and/or kV according to patient size and/or use of iterative reconstruction technique. COMPARISON:  None Available. FINDINGS: Lower chest: Bibasilar atelectasis. Mild cardiomegaly and coronary artery calcifications. Hepatobiliary: Scattered low-attenuation liver lesions which are too small to completely characterize but likely simple cysts. No suspicious liver lesion. Gallbladder is unremarkable. No biliary ductal dilation. Pancreas: Cystic lesion of the uncinate process of the pancreas measuring 1.4 cm, unchanged when compared with prior exam. No evidence of main pancreatic duct dilation. Spleen: Normal in size without focal abnormality. Adrenals/Urinary Tract: Bilateral adrenal  glands are unremarkable. No hydronephrosis. Bilateral cystic renal lesions, including an unchanged hyperdense lesion of the right kidney located on series 3, image 38 which is likely a proteinaceous  cyst. Cyst with few calcified septa of the left kidney (Bosniak II), no specific follow-up imaging is required. Bladder is unremarkable. Stomach/Bowel: Gastrostomy tube in place. Diverticulosis. Markedly redundant sigmoid colon with gaseous distention. Mild focal fat stranding of the sigmoid colon seen on series 6, image 48. No evidence of bowel wall thickening. Vascular/Lymphatic: Aortic atherosclerosis. No enlarged abdominal or pelvic lymph nodes. Reproductive: Prostatomegaly, measuring up to 5.6 cm. Other: Moderate left and small right fat containing inguinal hernias. Musculoskeletal: Bilateral total hip arthroplasties. Unchanged mild compression deformity of T11. New age indeterminate compression deformity of T12. IMPRESSION: 1. 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 2. Extensive diverticulosis with mild focal fat stranding of the sigmoid colon, concerning for early acute diverticulitis. 3. 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 Recommend follow-up MRCP in 2 years. 4. New age indeterminate compression deformity of T12. Correlate for point tenderness. 5.  Aortic Atherosclerosis (ICD10-I70.0). Electronically Signed   By: Yetta Glassman M.D.   On: 08/31/2021 17:13     Assessment   Kaiven G Jontavious Commons is a 88 y.o. year old male with history of GERD, BPH, HLD, HTN, and dysphagia with PEG tube who presented to the ED on 08/26/2021 with peripheral edema, generalized weakness, dyspnea.  Patient's daughter reported he has been progressively short of breath and was evaluated by home hospice in which the nurse advised for him to come to the ED due to his symptoms and stating that he is not a hospice candidate.  GI consulted for abdominal distention and CT imaging with evidence of possible ileus versus Ogilvie's.    Concern for Ileus vs Ogilvie's/early diverticulitis?: Patient has had ongoing abdominal distention this admission.  KUBs as outlined above.  CT A/P was performed yesterday due to ongoing distention and reported distended markedly redundant sigmoid colon with no focal transition point in the sigmoid colon without bowel wall thickening and noted possible concern for pseudo-obstruction (Ogilvie syndrome).    I reviewed case with Dr. Darcella Cheshire who feels as though with only sigmoid distention and scattered stool with prominent stool in the rectum and distal colon representing constipation.  He also has evidence of diverticulosis and some possible subtle early sigmoid diverticulitis.  Given his condition we will treat conservatively with scheduled MiraLAX daily.  IPMN: Cystic lesion of uncinate process of the pancreas measuring 1.4 cm without pancreatic ductal dilation noted on CT A/P performed yesterday 08/31/2021.  CT renal stone study from February 2020 with unremarkable pancreas.  Radiology has recommended an MRCP for follow-up in 2 years.  GERD: Maintained on famotidine.  Dysphagia: Chronic oropharyngeal.  Has remained n.p.o. with PEG tube in place and tube feeds completed at home.  Previously received 2 cartons of Jevity at 8 AM, 12 PM, 4 PM, and 1 carton at 8 PM.  He also received 300 ml free water flush with each feed at home.  While inpatient he will receive Osmolite 1.5 per nutrition.  Management per hospitalist.    Plan / Recommendations   Continue famotidine Continue supportive measures Continue tube feeds via PEG tube MiraLAX 17 g daily Avoid narcotics and anticholinergic agents  Repeat KUB in the morning Could consider follow up MRCP in  2 years if desired, family considering hospice/palliative care     09/01/2021, 8:46 AM  Venetia Night, MSN, FNP-BC, AGACNP-BC [facility]

## 2021-09-01 NOTE — Progress Notes (Signed)
Nutrition Follow up  DOCUMENTATION CODES:    INTERVENTION:   Advance tube feeding per MD  -Osmolite 1.5 @ 25 ml/hr currently when appropriate advance 10 ml/hr q 12 hrs to goal rate of 55 ml/hr.   Provides 1980 kcal, 83 gr protein and 1005 ml water from formula.  -Free water 200 ml TID   NUTRITION DIAGNOSIS:   Inadequate oral intake related to inability to eat as evidenced by NPO status (chronic and PEG dependent).   -continues   GOAL:  Patient will meet greater than or equal to 90% of their needs-   -not met - tube feeding held  MONITOR:  Labs, Skin, Weight trends, TF tolerance   ASSESSMENT:  Patient is a 88 yo male with HTN, Dysphagia, PEG and presents with shortness of breath, BLE edema, new CHF.   Patient not currently receiving tube feeding- nurse reports concern for ileus.   NPO-chronically. PEG tube with Jevity 1.2 with  (1659 ml) daily and free water 200 ml- TID. Providing 1990 kcal, 92 gr protein and 1338 ml water from formula and 600 ml free water flushes.  8/22 Patient is cleared to resume tube feeding- will change formula to Osmolite 1.5 to limit excess volume. Continue free water flushes. Discussed patient with nursing. He has not had a BM since 8/19. Nursing is requesting KUB prior to starting TF. RD will follow for results of imaging.   Palliative following. Patient desires full scope of care at this time.  8/23 Talked with nursing. Bloating and distention continues and no BM x 4 days.  Patient tube feeding resumed this morning Osmolite 1.5 @ 25 ml/hr. Will continue at current rate another 24 hr and re-assess.   Output since admission Net -8.733 liters.   Weight encounters: 10/06/20-wt 75.3 kg and current- 77.5 kg. Nutrition needs calculated based on adjusted weight (78 kg).   8/25 Patient has not been receiving tube feeding. Per nursing he is to resume at 25 ml/hr today at 1600. Goal rate to meet estimated needs provided above. Patient is to discharge to  [facility] over the weekend per SW.  Medications: Pepcid, lasix (40 mg BID). IV- rocephin.   Intake/Output Summary (Last 24 hours) at 09/01/2021 1445 Last data filed at 09/01/2021 0500 Gross per 24 hour  Intake --  Output 1150 ml  Net -1150 ml        Latest Ref Rng & Units 09/01/2021    3:09 AM 08/31/2021    3:09 AM 08/30/2021    3:30 AM  BMP  Glucose 70 - 99 mg/dL 87  152  78   BUN 8 - 23 mg/dL 37  38  38   Creatinine 0.61 - 1.24 mg/dL 1.16  1.01  1.29   Sodium 135 - 145 mmol/L 136  139  138   Potassium 3.5 - 5.1 mmol/L 4.2  3.2  3.6   Chloride 98 - 111 mmol/L 92  90  88   CO2 22 - 32 mmol/L 35  36  37   Calcium 8.9 - 10.3 mg/dL 8.9  8.9  8.8      Diet Order:   Diet Order             Diet NPO time specified  Diet effective now                   EDUCATION NEEDS:  Not appropriate for education at this time  Skin:  Skin Assessment: Reviewed RN Assessment (dry, flaky skin)  Last  BM:  8/19 bloating per nursing assessment  Height:   Ht Readings from Last 1 Encounters:  08/27/21 6' 2" (1.88 m)    Weight:   Wt Readings from Last 1 Encounters:  09/01/21 75.5 kg    Ideal Body Weight:   86 kg  BMI:  Body mass index is 21.37 kg/m.  Estimated Nutritional Needs:   Kcal:  1950- 2100  Protein:  90-95 gr  Fluid:  < 2 liters daily   Colman Cater MS,RD,CSG,LDN Contact: Shea Evans

## 2021-09-01 NOTE — Progress Notes (Signed)
Physical Therapy Treatment Patient Details Name: Dominic Hodges MRN: [id number] DOB: 1928-01-02 Today's Date: 09/01/2021   History of Present Illness Dominic Hodges is a 88 y.o. male with medical history significant of acid reflux, BPH, hyperlipidemia, hypertension, presents ED with a chief complaint of peripheral edema, generalized weakness, and dyspnea.  Patient's daughter provides most of the history.  She reports that patient has been getting heavier over the last week.  Its become a problem because his caretakers can no longer lift him to transfer him to commode.  He is also been progressively more short of breath.  He was working harder to breathe today.  This is why he came into the ER.  They report patient has had rattling breath sounds for quite some time and they have been under the impression that small upper airway.  Patient has dysphagia and is NPO.  He has had a PEG tube chronically.  Today they had him evaluated by hospice nurse.  It was the hospice nurse that also advised him to come into the ER when she saw how edematous, dyspneic, and diaphoretic patient was.  They were told that he cannot be in hospice as he does not have a diagnosis that meets the criteria.  Daughter is interested in palliative care.  Patient has not complained of any chest pain or palpitations.  He has no further complaints at this time.     Continues on PEG tube feedings.  He takes 1.2 kcals Jevity.  He needs 2 cartons at 8 AM, noon, 4 and 1 carton at 8 PM.  He has 300 cc of free water flush with each feeding.     Patient is not able to provide more history at this time as he is working hard to breathe and having difficulty understanding questions.    PT Comments    Patient requires assist for all mobility today and frequent cueing for sequencing. He demonstrates good sitting tolerance today with intermittent cueing for posture while completing seated exercises. Patient assisted to standing with RW and  demonstrates fair standing tolerance with assist for balance/weakness. Patient unable to take steps today secondary to weakness but is assisted back to bed at end of session. Patient will benefit from continued skilled physical therapy in hospital and recommended venue below to increase strength, balance, endurance for safe ADLs and gait.   Recommendations for follow up therapy are one component of a multi-disciplinary discharge planning process, led by the attending physician.  Recommendations may be updated based on patient status, additional functional criteria and insurance authorization.  Follow Up Recommendations  Skilled nursing-short term rehab (<3 hours/day) Can patient physically be transported by private vehicle: No   Assistance Recommended at Discharge Frequent or constant Supervision/Assistance  Patient can return home with the following Two people to help with bathing/dressing/bathroom;Two people to help with walking and/or transfers;Help with stairs or ramp for entrance   Equipment Recommendations  None recommended by PT    Recommendations for Other Services       Precautions / Restrictions Precautions Precautions: Fall Restrictions Weight Bearing Restrictions: No     Mobility  Bed Mobility Overal bed mobility: Needs Assistance Bed Mobility: Supine to Sit, Sit to Supine     Supine to sit: Mod assist, Max assist, HOB elevated Sit to supine: Max assist, Mod assist   General bed mobility comments: Pt able to move B LE partially off of bed. Assist to pull to sit. Assist to lift B LE into  bed and control trunk.    Transfers Overall transfer level: Needs assistance Equipment used: Rolling walker (2 wheels) Transfers: Sit to/from Stand Sit to Stand: Mod assist, Max assist           General transfer comment: performs trasnfer with cueing, RW and assist    Ambulation/Gait                   Stairs             Wheelchair Mobility    Modified  Rankin (Stroke Patients Only)       Balance Overall balance assessment: Needs assistance Sitting-balance support: Bilateral upper extremity supported, Feet supported Sitting balance-Leahy Scale: Fair Sitting balance - Comments: seated EOB   Standing balance support: Bilateral upper extremity supported, During functional activity, Reliant on assistive device for balance Standing balance-Leahy Scale: Poor Standing balance comment: using RW                            Cognition Arousal/Alertness: Awake/alert Behavior During Therapy: WFL for tasks assessed/performed Overall Cognitive Status: Within Functional Limits for tasks assessed                                 General Comments: Follows commands well; expressive difficulties.        Exercises General Exercises - Lower Extremity Ankle Circles/Pumps: AROM, Both, 10 reps, Seated Long Arc Quad: AROM, Both, 10 reps, Seated Hip Flexion/Marching: AROM, Both, 5 reps, Seated    General Comments        Pertinent Vitals/Pain Pain Assessment Pain Assessment: No/denies pain    Home Living                          Prior Function            PT Goals (current goals can now be found in the care plan section) Acute Rehab PT Goals Patient Stated Goal: to return home PT Goal Formulation: With patient/family Time For Goal Achievement: 09/12/21 Potential to Achieve Goals: Fair Progress towards PT goals: Progressing toward goals    Frequency    Min 2X/week      PT Plan Current plan remains appropriate    Co-evaluation              AM-PAC PT "6 Clicks" Mobility   Outcome Measure  Help needed turning from your back to your side while in a flat bed without using bedrails?: A Lot Help needed moving from lying on your back to sitting on the side of a flat bed without using bedrails?: A Lot Help needed moving to and from a bed to a chair (including a wheelchair)?: A Lot Help needed  standing up from a chair using your arms (e.g., wheelchair or bedside chair)?: A Lot Help needed to walk in hospital room?: Total Help needed climbing 3-5 steps with a railing? : Total 6 Click Score: 10    End of Session Equipment Utilized During Treatment: Oxygen Activity Tolerance: Patient limited by fatigue Patient left: in bed;with call bell/phone within reach Nurse Communication: Mobility status PT Visit Diagnosis: Muscle weakness (generalized) (M62.81);Difficulty in walking, not elsewhere classified (R26.2);Other abnormalities of gait and mobility (R26.89)     Time: 2947-6546 PT Time Calculation (min) (ACUTE ONLY): 31 min  Charges:  $Therapeutic Exercise: 8-22 mins $Therapeutic Activity: 8-22 mins  2:36 PM, 09/01/21 Mearl Latin PT, DPT Physical Therapist at [facility]

## 2021-09-02 ENCOUNTER — Inpatient Hospital Stay (HOSPITAL_COMMUNITY): Payer: Medicare Other

## 2021-09-02 DIAGNOSIS — Z7901 Long term (current) use of anticoagulants: Secondary | ICD-10-CM | POA: Diagnosis not present

## 2021-09-02 DIAGNOSIS — J690 Pneumonitis due to inhalation of food and vomit: Secondary | ICD-10-CM | POA: Diagnosis present

## 2021-09-02 DIAGNOSIS — K567 Ileus, unspecified: Secondary | ICD-10-CM | POA: Diagnosis not present

## 2021-09-02 DIAGNOSIS — L0390 Cellulitis, unspecified: Secondary | ICD-10-CM | POA: Diagnosis not present

## 2021-09-02 DIAGNOSIS — I5021 Acute systolic (congestive) heart failure: Secondary | ICD-10-CM | POA: Diagnosis not present

## 2021-09-02 DIAGNOSIS — R0602 Shortness of breath: Secondary | ICD-10-CM | POA: Diagnosis not present

## 2021-09-02 DIAGNOSIS — A419 Sepsis, unspecified organism: Secondary | ICD-10-CM | POA: Diagnosis not present

## 2021-09-02 DIAGNOSIS — K5730 Diverticulosis of large intestine without perforation or abscess without bleeding: Secondary | ICD-10-CM | POA: Diagnosis not present

## 2021-09-02 DIAGNOSIS — E785 Hyperlipidemia, unspecified: Secondary | ICD-10-CM | POA: Diagnosis present

## 2021-09-02 DIAGNOSIS — N400 Enlarged prostate without lower urinary tract symptoms: Secondary | ICD-10-CM | POA: Diagnosis not present

## 2021-09-02 DIAGNOSIS — U071 COVID-19: Secondary | ICD-10-CM | POA: Diagnosis not present

## 2021-09-02 DIAGNOSIS — F05 Delirium due to known physiological condition: Secondary | ICD-10-CM | POA: Diagnosis not present

## 2021-09-02 DIAGNOSIS — N179 Acute kidney failure, unspecified: Secondary | ICD-10-CM | POA: Diagnosis not present

## 2021-09-02 DIAGNOSIS — M47816 Spondylosis without myelopathy or radiculopathy, lumbar region: Secondary | ICD-10-CM | POA: Diagnosis not present

## 2021-09-02 DIAGNOSIS — M419 Scoliosis, unspecified: Secondary | ICD-10-CM | POA: Diagnosis not present

## 2021-09-02 DIAGNOSIS — Z4682 Encounter for fitting and adjustment of non-vascular catheter: Secondary | ICD-10-CM | POA: Diagnosis not present

## 2021-09-02 DIAGNOSIS — Z9181 History of falling: Secondary | ICD-10-CM | POA: Diagnosis not present

## 2021-09-02 DIAGNOSIS — R4189 Other symptoms and signs involving cognitive functions and awareness: Secondary | ICD-10-CM | POA: Diagnosis not present

## 2021-09-02 DIAGNOSIS — R778 Other specified abnormalities of plasma proteins: Secondary | ICD-10-CM | POA: Diagnosis not present

## 2021-09-02 DIAGNOSIS — I129 Hypertensive chronic kidney disease with stage 1 through stage 4 chronic kidney disease, or unspecified chronic kidney disease: Secondary | ICD-10-CM | POA: Diagnosis not present

## 2021-09-02 DIAGNOSIS — I959 Hypotension, unspecified: Secondary | ICD-10-CM | POA: Diagnosis not present

## 2021-09-02 DIAGNOSIS — I517 Cardiomegaly: Secondary | ICD-10-CM | POA: Diagnosis not present

## 2021-09-02 DIAGNOSIS — R0603 Acute respiratory distress: Secondary | ICD-10-CM | POA: Diagnosis not present

## 2021-09-02 DIAGNOSIS — R0600 Dyspnea, unspecified: Secondary | ICD-10-CM | POA: Diagnosis not present

## 2021-09-02 DIAGNOSIS — I5022 Chronic systolic (congestive) heart failure: Secondary | ICD-10-CM | POA: Diagnosis not present

## 2021-09-02 DIAGNOSIS — I4891 Unspecified atrial fibrillation: Secondary | ICD-10-CM | POA: Diagnosis not present

## 2021-09-02 DIAGNOSIS — R41841 Cognitive communication deficit: Secondary | ICD-10-CM | POA: Diagnosis not present

## 2021-09-02 DIAGNOSIS — E871 Hypo-osmolality and hyponatremia: Secondary | ICD-10-CM | POA: Diagnosis not present

## 2021-09-02 DIAGNOSIS — K6389 Other specified diseases of intestine: Secondary | ICD-10-CM | POA: Diagnosis not present

## 2021-09-02 DIAGNOSIS — N183 Chronic kidney disease, stage 3 unspecified: Secondary | ICD-10-CM | POA: Diagnosis not present

## 2021-09-02 DIAGNOSIS — Y95 Nosocomial condition: Secondary | ICD-10-CM | POA: Diagnosis present

## 2021-09-02 DIAGNOSIS — I509 Heart failure, unspecified: Secondary | ICD-10-CM | POA: Diagnosis not present

## 2021-09-02 DIAGNOSIS — R531 Weakness: Secondary | ICD-10-CM | POA: Diagnosis not present

## 2021-09-02 DIAGNOSIS — E1122 Type 2 diabetes mellitus with diabetic chronic kidney disease: Secondary | ICD-10-CM | POA: Diagnosis present

## 2021-09-02 DIAGNOSIS — N281 Cyst of kidney, acquired: Secondary | ICD-10-CM | POA: Diagnosis not present

## 2021-09-02 DIAGNOSIS — Z515 Encounter for palliative care: Secondary | ICD-10-CM | POA: Diagnosis not present

## 2021-09-02 DIAGNOSIS — R293 Abnormal posture: Secondary | ICD-10-CM | POA: Diagnosis not present

## 2021-09-02 DIAGNOSIS — J9811 Atelectasis: Secondary | ICD-10-CM | POA: Diagnosis not present

## 2021-09-02 DIAGNOSIS — E870 Hyperosmolality and hypernatremia: Secondary | ICD-10-CM | POA: Diagnosis present

## 2021-09-02 DIAGNOSIS — R1312 Dysphagia, oropharyngeal phase: Secondary | ICD-10-CM | POA: Diagnosis not present

## 2021-09-02 DIAGNOSIS — J189 Pneumonia, unspecified organism: Secondary | ICD-10-CM | POA: Diagnosis not present

## 2021-09-02 DIAGNOSIS — E43 Unspecified severe protein-calorie malnutrition: Secondary | ICD-10-CM | POA: Diagnosis not present

## 2021-09-02 DIAGNOSIS — Z931 Gastrostomy status: Secondary | ICD-10-CM | POA: Diagnosis not present

## 2021-09-02 DIAGNOSIS — L03116 Cellulitis of left lower limb: Secondary | ICD-10-CM | POA: Diagnosis not present

## 2021-09-02 DIAGNOSIS — Z66 Do not resuscitate: Secondary | ICD-10-CM | POA: Diagnosis present

## 2021-09-02 DIAGNOSIS — R0902 Hypoxemia: Secondary | ICD-10-CM | POA: Diagnosis not present

## 2021-09-02 DIAGNOSIS — M6281 Muscle weakness (generalized): Secondary | ICD-10-CM | POA: Diagnosis not present

## 2021-09-02 DIAGNOSIS — I130 Hypertensive heart and chronic kidney disease with heart failure and stage 1 through stage 4 chronic kidney disease, or unspecified chronic kidney disease: Secondary | ICD-10-CM | POA: Diagnosis present

## 2021-09-02 DIAGNOSIS — Z87891 Personal history of nicotine dependence: Secondary | ICD-10-CM | POA: Diagnosis not present

## 2021-09-02 DIAGNOSIS — E876 Hypokalemia: Secondary | ICD-10-CM | POA: Diagnosis present

## 2021-09-02 DIAGNOSIS — R278 Other lack of coordination: Secondary | ICD-10-CM | POA: Diagnosis not present

## 2021-09-02 DIAGNOSIS — N1832 Chronic kidney disease, stage 3b: Secondary | ICD-10-CM | POA: Diagnosis present

## 2021-09-02 DIAGNOSIS — Z8249 Family history of ischemic heart disease and other diseases of the circulatory system: Secondary | ICD-10-CM | POA: Diagnosis not present

## 2021-09-02 DIAGNOSIS — R918 Other nonspecific abnormal finding of lung field: Secondary | ICD-10-CM | POA: Diagnosis not present

## 2021-09-02 DIAGNOSIS — R627 Adult failure to thrive: Secondary | ICD-10-CM | POA: Diagnosis not present

## 2021-09-02 DIAGNOSIS — E440 Moderate protein-calorie malnutrition: Secondary | ICD-10-CM | POA: Diagnosis not present

## 2021-09-02 DIAGNOSIS — R488 Other symbolic dysfunctions: Secondary | ICD-10-CM | POA: Diagnosis not present

## 2021-09-02 DIAGNOSIS — Z431 Encounter for attention to gastrostomy: Secondary | ICD-10-CM | POA: Diagnosis not present

## 2021-09-02 DIAGNOSIS — E1165 Type 2 diabetes mellitus with hyperglycemia: Secondary | ICD-10-CM | POA: Diagnosis present

## 2021-09-02 DIAGNOSIS — E11649 Type 2 diabetes mellitus with hypoglycemia without coma: Secondary | ICD-10-CM | POA: Diagnosis not present

## 2021-09-02 DIAGNOSIS — Z7189 Other specified counseling: Secondary | ICD-10-CM | POA: Diagnosis not present

## 2021-09-02 DIAGNOSIS — R0689 Other abnormalities of breathing: Secondary | ICD-10-CM | POA: Diagnosis not present

## 2021-09-02 DIAGNOSIS — I4819 Other persistent atrial fibrillation: Secondary | ICD-10-CM | POA: Diagnosis not present

## 2021-09-02 DIAGNOSIS — K5939 Other megacolon: Secondary | ICD-10-CM | POA: Diagnosis not present

## 2021-09-02 DIAGNOSIS — I480 Paroxysmal atrial fibrillation: Secondary | ICD-10-CM | POA: Diagnosis not present

## 2021-09-02 DIAGNOSIS — R739 Hyperglycemia, unspecified: Secondary | ICD-10-CM | POA: Diagnosis not present

## 2021-09-02 DIAGNOSIS — R279 Unspecified lack of coordination: Secondary | ICD-10-CM | POA: Diagnosis not present

## 2021-09-02 DIAGNOSIS — A4189 Other specified sepsis: Secondary | ICD-10-CM | POA: Diagnosis present

## 2021-09-02 DIAGNOSIS — J9611 Chronic respiratory failure with hypoxia: Secondary | ICD-10-CM | POA: Diagnosis not present

## 2021-09-02 DIAGNOSIS — Z7401 Bed confinement status: Secondary | ICD-10-CM | POA: Diagnosis not present

## 2021-09-02 DIAGNOSIS — R6520 Severe sepsis without septic shock: Secondary | ICD-10-CM | POA: Diagnosis not present

## 2021-09-02 DIAGNOSIS — K5909 Other constipation: Secondary | ICD-10-CM | POA: Diagnosis not present

## 2021-09-02 DIAGNOSIS — R29818 Other symptoms and signs involving the nervous system: Secondary | ICD-10-CM | POA: Diagnosis not present

## 2021-09-02 DIAGNOSIS — F0150 Vascular dementia without behavioral disturbance: Secondary | ICD-10-CM | POA: Diagnosis not present

## 2021-09-02 DIAGNOSIS — I10 Essential (primary) hypertension: Secondary | ICD-10-CM | POA: Diagnosis not present

## 2021-09-02 DIAGNOSIS — E872 Acidosis, unspecified: Secondary | ICD-10-CM | POA: Diagnosis present

## 2021-09-02 DIAGNOSIS — E860 Dehydration: Secondary | ICD-10-CM | POA: Diagnosis present

## 2021-09-02 DIAGNOSIS — K9423 Gastrostomy malfunction: Secondary | ICD-10-CM | POA: Diagnosis not present

## 2021-09-02 DIAGNOSIS — Z741 Need for assistance with personal care: Secondary | ICD-10-CM | POA: Diagnosis not present

## 2021-09-02 DIAGNOSIS — R109 Unspecified abdominal pain: Secondary | ICD-10-CM | POA: Diagnosis not present

## 2021-09-02 DIAGNOSIS — J9601 Acute respiratory failure with hypoxia: Secondary | ICD-10-CM | POA: Diagnosis not present

## 2021-09-02 DIAGNOSIS — R1310 Dysphagia, unspecified: Secondary | ICD-10-CM | POA: Diagnosis not present

## 2021-09-02 LAB — BASIC METABOLIC PANEL
Anion gap: 12 (ref 5–15)
BUN: 43 mg/dL — ABNORMAL HIGH (ref 8–23)
CO2: 35 mmol/L — ABNORMAL HIGH (ref 22–32)
Calcium: 8.6 mg/dL — ABNORMAL LOW (ref 8.9–10.3)
Chloride: 90 mmol/L — ABNORMAL LOW (ref 98–111)
Creatinine, Ser: 1.36 mg/dL — ABNORMAL HIGH (ref 0.61–1.24)
GFR, Estimated: 49 mL/min — ABNORMAL LOW (ref >60)
Glucose, Bld: 150 mg/dL — ABNORMAL HIGH (ref 70–99)
Potassium: 3.5 mmol/L (ref 3.5–5.1)
Sodium: 137 mmol/L (ref 135–145)

## 2021-09-02 LAB — GLUCOSE, CAPILLARY
Glucose-Capillary: 149 mg/dL — ABNORMAL HIGH (ref 70–99)
Glucose-Capillary: 156 mg/dL — ABNORMAL HIGH (ref 70–99)
Glucose-Capillary: 203 mg/dL — ABNORMAL HIGH (ref 70–99)

## 2021-09-02 LAB — MAGNESIUM: Magnesium: 2.2 mg/dL (ref 1.7–2.4)

## 2021-09-02 MED ORDER — POLYETHYLENE GLYCOL 3350 17 G PO PACK: 17.0000 g | PACK | Freq: Every day | ORAL | 0 refills | Status: DC

## 2021-09-02 MED ORDER — FREE WATER: 200.0000 mL | Freq: Three times a day (TID) | Status: DC

## 2021-09-02 MED ORDER — OSMOLITE 1.5 CAL PO LIQD: 1000.0000 mL | ORAL | 0 refills | Status: DC

## 2021-09-02 MED ORDER — TORSEMIDE 20 MG PO TABS: 20.0000 mg | ORAL_TABLET | Freq: Every day | ORAL | Status: DC

## 2021-09-02 MED ORDER — APIXABAN 5 MG PO TABS: 5.0000 mg | ORAL_TABLET | Freq: Two times a day (BID) | ORAL | Status: DC

## 2021-09-02 NOTE — Discharge Summary (Signed)
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 male with medical history significant of acid reflux, BPH, hyperlipidemia, hypertension, presents ED with a chief complaint of peripheral edema, generalized weakness, and dyspnea.   Patient's daughter provides most of the history.  She reports that patient has been getting heavier over the last week.  Its become a problem because his caretakers can no longer lift him to transfer him to commode.  He is also been progressively more short of breath.  He was working harder to breathe today.  This is why he came into the ER.  They report patient has had rattling breath sounds for quite some time and they have been under the impression that small upper airway.  Patient has dysphagia and is NPO.  He has had a PEG tube chronically.  Today they had him evaluated by hospice nurse.  It was the hospice nurse that also advised him to come into the ER when she saw how edematous, dyspneic, and diaphoretic patient was.  They were told that he cannot be in hospice as he does not have a diagnosis that meets the criteria.   Daughter is interested in palliative care.  Patient has not complained of any chest pain or palpitations.  He has no further complaints at this time.   Continues on PEG tube feedings.  He takes 1.2 kcals Jevity.  He needs 2 cartons at 8 AM, noon, 4 and 1 carton at 8 PM.  He has 300 cc of free water flush with each feeding.   ED: Temp 98.1, heart rate 90-100, respiratory rate 26-40, blood pressure 133/72-174/89 No leukocytosis at 8.2, hemoglobin 13.2, platelets 217 Chemistry reveals a hyponatremia and a  hyperglycemia, he is still hyponatremic after correction BNP 873 [address]pe initially 62, and then 70 -Chest x-ray shows cardiomegaly, interstitial pulmonary edema EKG shows sinus rhythm, QTc 488, heart rate 94 Lasix 40 mg IV given x1 DuoNeb given -Foley placed Admission requested for CHF exacerbation    Assessment and Plan: * Acute systolic CHF (congestive heart failure) ([place]) Appreciate cardiology follow up -IV lasix transitioned to po torsemide 20 mg bid -change torsemide to 20 mg once daily at time of d/c as serum creatinine trending up last 3 days prior to d/c on torsemide 20 mg bid -repeat BMP in 3-4 days after dc -not a candidate for aggressive therapies/invasive angiography -08/27/21 Echo EF 45-50%, global HK NEG 10.1 L BP remains soft, but improving/stable  Ileus ([place]) 4/22 KUB--Mild improvement in diffuse colonic dilatation compatible with Ileus. -abd remains mildly distended -8/24 CT abd/pelvis--Distended and markedly redundant sigmoid colon with no focal transition point, possible [facility]'s GI consult appreciated>>felt to be due to constipation>>started cathartics --restart enteral feeding 8/26--tolerating enteral feeding; having BMs, abd nondistended --discussed with Dr. Osie Bond to d/c --increase TF to 45 mL/hr and increase by 10 mL/hr every 12 hours for goal rate of 60 mL/hour  Acute respiratory failure with hypoxia ([place]) - Patient acute respiratory failure due to CHF -Patient initially required BiPAP ABG    Component Value Date/Time   PHART 7.49 (H) 08/29/2021 2725  PCO2ART 56 (H) 08/29/2021 0951   PO2ART 79 (L) 08/29/2021 0951   HCO3 42.7 (H) 08/29/2021 0951   O2SAT 98.2 08/29/2021 0951   Now weaned to 2L>>remains stable  Hypotensive episode -BP improved and stable albeit soft -Discontinue clonidine patch -Started midodrine 5 mg p.o. 3 times daily>>> BP stable>>stopped -Status post repleting  Albumin 25 g x 1 08/27/21 -Monitoring  closely  Cellulitis -Expected much improved Left lower extremity edema erythema -Was on doxycycline as an outpatient with no improvement   -On admission patient was started on Rocephin - Continue -Pro-Cal less than 0.10, WBC 8.9, Resolved, finished 7 days abx   New onset atrial fibrillation ([place]) -developed during admission, rate controlled without medications.  - seen by Dr Marisue Ivan yesterday with talks with patient and family about anticoagulation, family was to consider and make final decision based on overall prognosis and risk vs benefit evaluation -discussed risks/benefits with pt and daughter>>want to start apixaban -rate controlled  Goals of care, counseling/discussion Appreciate palliative consult Continue full scope of care Plan d/c to SNF with palliative following  Elevated troponin - Demand ischemia in the setting of hypoxia and CHF exacerbation - I-troponin 62, 70, 99 -Denies any chest pain  - Echo: LVE 75-10%, indet diastolic dysfunction but severe LAE would suggest long standing elevated LA pressures   -Monitor on telemetry  GERD (gastroesophageal reflux disease) -Continue pepcid  Generalized weakness -Severe global weakness, with severe debility -Ambulates with assist only -deconditioning and chronic illness -pt eval and treat>>SNF  Hyponatremia -Na 121, (previously 137) >>> 128 >>132, 135  -Hypervolemic hyponatremia -improved with diuresis  Dysphagia, oropharyngeal -NPO  -PEG tube in place, continue tube feeds when ileus improved -dietician consult -at home he does 2 cartons of jevity at 8a, 12p, 4p, and one carton at 8p of jevity -He received 355m free water flushes with each feed -continue osmolite 1.5 while inpatient  Essential hypertension, benign -BP has much improved/stable but soft - was Hypotensive, discontinude clonidine -On midodrine---discontinued 08/29/2021          Consultants: GI, palliative, cardiology Procedures  performed: none  Disposition: Skilled nursing facility Diet recommendation:  Enteral feeding--Osmolite 1.5 as above DISCHARGE MEDICATION: Allergies as of 09/02/2021       Reactions   Robaxin [methocarbamol] Other (See Comments)   Mental confusion        Medication List     STOP taking these medications    cloNIDine 0.1 mg/24hr patch Commonly known as: CATAPRES - Dosed in mg/24 hr   PROBIOTIC DAILY PO       TAKE these medications    acetaminophen 160 MG/5ML elixir Commonly known as: TYLENOL Place 325 mg into feeding tube every 6 (six) hours as needed for fever. Give 650 mg   apixaban 5 MG Tabs tablet Commonly known as: ELIQUIS Place 1 tablet (5 mg total) into feeding tube 2 (two) times daily.   aspirin EC 81 MG tablet Take 81 mg by mouth daily. Swallow whole.   feeding supplement (OSMOLITE 1.5 CAL) Liqd Place 1,000 mLs into feeding tube continuous. Start at 45 cc/hr and increase by 10 cc/hr every 12 hours to goal rate of 60 cc/hr   free water Soln Place 200 mLs into feeding tube 4 (four) times daily -  before meals and at bedtime.   polyethylene glycol 17 g packet Commonly known as: MIRALAX / GLYCOLAX Place 17 g into feeding tube daily. Start taking on: September 03, 2021 What changed:  when to take this reasons  to take this   torsemide 20 MG tablet Commonly known as: DEMADEX Place 1 tablet (20 mg total) into feeding tube daily. What changed:  medication strength how much to take how to take this        Contact information for follow-up providers     [facility] [facility].   Specialty: Cardiology Why: [facility] - please note this is the [place] as we did not have any availability in our [place] office in the timeframe needed. You will see Nicholes Rough, PA with [facility] on Monday Sep 25, 2021 3:10 PM (Arrive by 2:55 PM). Contact information: [address] [place] [phone]             Contact information for after-discharge care     [facility] .   Service: Skilled Nursing Contact information: [address] [place] [phone]                    Discharge Exam: Danley Danker Weights   08/31/21 0300 09/01/21 0606 09/02/21 0336  Weight: 75.6 kg 75.5 kg 73.9 kg   HEENT:  [place]/AT, No thrush, no icterus CV:  RRR, no rub, no S3, no S4 Lung:  CTA, no wheeze, no rhonchi Abd:  soft/+BS, NT Ext:  No edema, no lymphangitis, no synovitis, no rash   Condition at discharge: stable  The results of significant diagnostics from this hospitalization (including imaging, microbiology, ancillary and laboratory) are listed below for reference.   Imaging Studies: DG Abd 1 View  Result Date: 09/02/2021 CLINICAL DATA:  Abdominal distension. EXAM: ABDOMEN - 1 VIEW COMPARISON:  August 29, 2021 KUB. FINDINGS: Sigmoid colon dilatation is unchanged since previous CT imaging. Moderate fecal loading in the colon. A PEG tube is identified in the left upper quadrant. No other interval changes. IMPRESSION: Continued air-filled dilated sigmoid colon, unchanged. No other changes. Electronically Signed   By: Dorise Bullion III M.D.   On: 09/02/2021 09:59   CT ABDOMEN PELVIS WO CONTRAST  Result Date: 08/31/2021 CLINICAL DATA:  Concern for bowel obstruction EXAM: CT ABDOMEN AND PELVIS WITHOUT CONTRAST TECHNIQUE: Multidetector CT imaging of the abdomen and pelvis was performed following the standard protocol without IV contrast. RADIATION DOSE REDUCTION: This exam was performed according to the departmental dose-optimization program which includes automated exposure control, adjustment of the mA and/or kV according to patient size and/or use of iterative reconstruction technique. COMPARISON:  None Available. FINDINGS: Lower chest: Bibasilar atelectasis. Mild cardiomegaly and coronary  artery calcifications. Hepatobiliary: Scattered low-attenuation liver lesions which are too small to completely characterize but likely simple cysts. No suspicious liver lesion. Gallbladder is unremarkable. No biliary ductal dilation. Pancreas: Cystic lesion of the uncinate process of the pancreas measuring 1.4 cm, unchanged when compared with prior exam. No evidence of main pancreatic duct dilation. Spleen: Normal in size without focal abnormality. Adrenals/Urinary Tract: Bilateral adrenal glands are unremarkable. No hydronephrosis. Bilateral cystic renal lesions, including an unchanged hyperdense lesion of the right kidney located on series 3, image 38 which is likely a proteinaceous cyst. Cyst with few calcified septa of the left kidney (Bosniak II), no specific follow-up imaging is required. Bladder is unremarkable. Stomach/Bowel: Gastrostomy tube in place. Diverticulosis. Markedly redundant sigmoid colon with gaseous distention. Mild focal fat stranding of the sigmoid colon seen on series 6, image 48. No evidence of bowel wall thickening. Vascular/Lymphatic: Aortic  atherosclerosis. No enlarged abdominal or pelvic lymph nodes. Reproductive: Prostatomegaly, measuring up to 5.6 cm. Other: Moderate left and small right fat containing inguinal hernias. Musculoskeletal: Bilateral total hip arthroplasties. Unchanged mild compression deformity of T11. New age indeterminate compression deformity of T12. IMPRESSION: 1. 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 2. Extensive diverticulosis with mild focal fat stranding of the sigmoid colon, concerning for early acute diverticulitis. 3. 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 Recommend follow-up MRCP in 2 years. 4. New age indeterminate compression deformity of T12. Correlate for  point tenderness. 5.  Aortic Atherosclerosis (ICD10-I70.0). Electronically Signed   By: Yetta Glassman M.D.   On: 08/31/2021 17:13   DG Abd 1 View  Result Date: 08/29/2021 CLINICAL DATA:  Abdominal pain rule out ileus EXAM: ABDOMEN - 1 VIEW COMPARISON:  Abdomen 08/27/2021 FINDINGS: Colonic dilatation mildly improved.  No small bowel dilatation. Gastrostomy tube overlies the stomach unchanged. Lumbar scoliosis.  Bilateral hip replacement IMPRESSION: Mild improvement in diffuse colonic dilatation compatible with ileus. Electronically Signed   By: Franchot Gallo M.D.   On: 08/29/2021 14:59   DG CHEST PORT 1 VIEW  Result Date: 08/29/2021 CLINICAL DATA:  Shortness of breath EXAM: PORTABLE CHEST 1 VIEW COMPARISON:  Previous studies including the examination of 08/28/2021 FINDINGS: Transverse diameter of heart is increased. There are no signs of pulmonary edema. There is poor inspiration. Increased markings are seen in the lower lung fields, more so on the left side with no significant interval change. Lateral CP angles are clear. There is no pneumothorax. Degenerative changes are noted in both shoulders, more so on the right side. IMPRESSION: Patchy infiltrates are seen in both lower lung fields, more so on the left side suggesting atelectasis/pneumonia. Part of this finding may suggest underlying scarring. No significant interval changes are noted. Electronically Signed   By: Elmer Picker M.D.   On: 08/29/2021 09:33   DG CHEST PORT 1 VIEW  Result Date: 08/28/2021 CLINICAL DATA:  Cough, shortness of breath EXAM: PORTABLE CHEST 1 VIEW COMPARISON:  08/26/2021 FINDINGS: Stable cardiomediastinal contours. Aortic atherosclerosis. Low lung volumes. Mildly prominent interstitial markings, most pronounced in the left lung base, similar to prior. No effusion or pneumothorax. IMPRESSION: Stable chest radiograph with low lung volumes and persistent mildly prominent interstitial markings, most pronounced in the  left lung base. Electronically Signed   By: Davina Poke D.O.   On: 08/28/2021 10:14   DG Abd 1 View  Result Date: 08/27/2021 CLINICAL DATA:  Abdominal distention EXAM: ABDOMEN - 1 VIEW COMPARISON:  None Available. FINDINGS: Gastrostomy is seen in upper abdomen. There is no significant small bowel dilation. Stomach is not distended. There is gaseous distention of right colon. There is no distention of left colon. Colon is interposed between liver and abdominal wall. Patchy densities are seen in the left lower lung field. There is previous arthroplasty in both hips. Degenerative changes are noted in lumbar spine. IMPRESSION: There is a moderate to marked gaseous distention of right colon measuring up to 10.9 cm in diameter. This may suggest ileus or partial colonic obstruction. There is no significant small bowel dilation. Increased markings in left lower lung field suggest scarring or atelectasis or pneumonia. Electronically Signed   By: Elmer Picker M.D.   On: 08/27/2021 21:04   ECHOCARDIOGRAM COMPLETE  Result Date: 08/27/2021    ECHOCARDIOGRAM REPORT  Patient Name:   DR. Brooke Bonito Date of Exam: 08/27/2021 Medical Rec #:  [id number]              Height:       77.0 in Accession #:    [id number]             Weight:       165.6 lb Date of Birth:  27-Jul-1927              BSA:          2.064 m Patient Age:    31 years               BP:           94/46 mmHg Patient Gender: M                      HR:           83 bpm. Exam Location:  [facility] Procedure: 2D Echo, Cardiac Doppler and Color Doppler Indications:    CHF  History:        Patient has no prior history of Echocardiogram examinations.                 CHF; Risk Factors:Hypertension and Dyslipidemia.  Sonographer:    Wenda Low Referring Phys: [id number] ASIA B [place]  Sonographer Comments: Suboptimal subcostal window. IMPRESSIONS  1. Left ventricular ejection fraction, by estimation, is 45 to 50%. The left ventricle has mildly  decreased function. The left ventricle demonstrates global hypokinesis. There is moderate asymmetric left ventricular hypertrophy of the basal-septal segment. Left ventricular diastolic parameters are indeterminate.  2. Right ventricular systolic function is mildly reduced. The right ventricular size is mildly enlarged.  3. Left atrial size was severely dilated.  4. The mitral valve is normal in structure. Trivial mitral valve regurgitation. No evidence of mitral stenosis.  5. The aortic valve is tricuspid. Aortic valve regurgitation is trivial. Aortic valve sclerosis/calcification is present, without any evidence of aortic stenosis.  6. Aortic dilatation noted. There is mild dilatation of the ascending aorta, measuring 39 mm. FINDINGS  Left Ventricle: Left ventricular ejection fraction, by estimation, is 45 to 50%. The left ventricle has mildly decreased function. The left ventricle demonstrates global hypokinesis. The left ventricular internal cavity size was normal in size. There is  moderate asymmetric left ventricular hypertrophy of the basal-septal segment. Left ventricular diastolic parameters are indeterminate. Right Ventricle: The right ventricular size is mildly enlarged. Right vetricular wall thickness was not well visualized. Right ventricular systolic function is mildly reduced. The tricuspid regurgitant velocity is 2.65 m/s, and with an assumed right atrial pressure of 8 mmHg, the estimated right ventricular systolic pressure is 58.8 mmHg. Left Atrium: Left atrial size was severely dilated. Right Atrium: Right atrial size was normal in size. Pericardium: There is no evidence of pericardial effusion. Mitral Valve: The mitral valve is normal in structure. Trivial mitral valve regurgitation. No evidence of mitral valve stenosis. MV peak gradient, 2.6 mmHg. The mean mitral valve gradient is 1.0 mmHg. Tricuspid Valve: The tricuspid valve is normal in structure. Tricuspid valve regurgitation is trivial.  Aortic Valve: The aortic valve is tricuspid. Aortic valve regurgitation is trivial. Aortic valve sclerosis/calcification is present, without any evidence of aortic stenosis. Aortic valve mean gradient measures 4.7 mmHg. Aortic valve peak gradient measures 9.6 mmHg. Aortic valve area, by VTI measures 1.70 cm. Pulmonic Valve: The pulmonic valve was not well visualized. Pulmonic  valve regurgitation is trivial. Aorta: The aortic root is normal in size and structure and aortic dilatation noted. There is mild dilatation of the ascending aorta, measuring 39 mm. IAS/Shunts: The interatrial septum was not well visualized.  LEFT VENTRICLE PLAX 2D LVIDd:         5.00 cm   Diastology LVIDs:         3.70 cm   LV e' lateral:   8.38 cm/s LV PW:         1.60 cm   LV E/e' lateral: 9.0 LV IVS:        1.15 cm LVOT diam:     2.00 cm LV SV:         52 LV SV Index:   25 LVOT Area:     3.14 cm  RIGHT VENTRICLE RV Basal diam:  4.40 cm RV Mid diam:    3.30 cm RV S prime:     11.60 cm/s LEFT ATRIUM              Index        RIGHT ATRIUM           Index LA diam:        5.30 cm  2.57 cm/m   RA Area:     21.40 cm LA Vol (A2C):   120.0 ml 58.13 ml/m  RA Volume:   59.90 ml  29.02 ml/m LA Vol (A4C):   89.7 ml  43.45 ml/m LA Biplane Vol: 108.0 ml 52.32 ml/m  AORTIC VALVE                     PULMONIC VALVE AV Area (Vmax):    1.84 cm      PV Vmax:       0.66 m/s AV Area (Vmean):   1.68 cm      PV Peak grad:  1.7 mmHg AV Area (VTI):     1.70 cm AV Vmax:           154.67 cm/s AV Vmean:          100.600 cm/s AV VTI:            0.302 m AV Peak Grad:      9.6 mmHg AV Mean Grad:      4.7 mmHg LVOT Vmax:         90.40 cm/s LVOT Vmean:        53.750 cm/s LVOT VTI:          0.164 m LVOT/AV VTI ratio: 0.54  AORTA Ao Root diam: 3.60 cm Ao Asc diam:  3.90 cm MITRAL VALVE               TRICUSPID VALVE MV Area (PHT): 2.43 cm    TR Peak grad:   28.1 mmHg MV Area VTI:   2.49 cm    TR Vmax:        265.00 cm/s MV Peak grad:  2.6 mmHg MV Mean grad:  1.0  mmHg    SHUNTS MV Vmax:       0.81 m/s    Systemic VTI:  0.16 m MV Vmean:      33.2 cm/s   Systemic Diam: 2.00 cm MV Decel Time: 312 msec MV E velocity: 75.40 cm/s MV A velocity: 33.00 cm/s MV E/A ratio:  2.28 Oswaldo Milian MD Electronically signed by Oswaldo Milian MD Signature Date/Time: 08/27/2021/2:07:41 PM    Final    DG Chest [facility]  Result Date: 08/26/2021 CLINICAL DATA:  Shortness of breath EXAM: PORTABLE CHEST 1 VIEW COMPARISON:  10/27/2020 FINDINGS: Cardiomegaly. Mild, diffuse bilateral interstitial pulmonary opacity. The visualized skeletal structures are unremarkable. IMPRESSION: Cardiomegaly with mild, diffuse bilateral interstitial pulmonary opacity, likely edema. No focal airspace opacity. Electronically Signed   By: Delanna Ahmadi M.D.   On: 08/26/2021 18:05    Microbiology: Results for orders placed or performed during the hospital encounter of 08/26/21  MRSA Next Gen by PCR, Nasal     Status: None   Collection Time: 08/26/21  8:39 PM   Specimen: Nasal Mucosa; Nasal Swab  Result Value Ref Range Status   MRSA by PCR Next Gen NOT DETECTED NOT DETECTED Final    Comment: (NOTE) The GeneXpert MRSA Assay (FDA approved for NASAL specimens only), is one component of a comprehensive MRSA colonization surveillance program. It is not intended to diagnose MRSA infection nor to guide or monitor treatment for MRSA infections. Test performance is not FDA approved in patients less than 48 years old. Performed at [facility], [address], [place], [place] [address]     Labs: CBC: Recent Labs  Lab 08/26/21 1747 08/27/21 0533  WBC 8.2 8.9  NEUTROABS 7.1 6.8  HGB 13.2 13.3  HCT 38.0* 38.4*  MCV 90.5 90.8  PLT 217 604   Basic Metabolic Panel: Recent Labs  Lab 08/27/21 0533 08/28/21 0437 08/29/21 0513 08/30/21 0330 08/31/21 0309 09/01/21 0309 09/02/21 0548  NA 128*   < > 135 138 139 136 137  K 3.7   < > 3.4* 3.6 3.2* 4.2 3.5  CL 87*   < > 89* 88* 90* 92*  90*  CO2 31   < > 36* 37* 36* 35* 35*  GLUCOSE 96   < > 88 78 152* 87 150*  BUN 33*   < > 34* 38* 38* 37* 43*  CREATININE 0.94   < > 1.13 1.29* 1.01 1.16 1.36*  CALCIUM 8.2*   < > 8.5* 8.8* 8.9 8.9 8.6*  MG 2.2  --   --   --  2.3 2.3 2.2   < > = values in this interval not displayed.   Liver Function Tests: Recent Labs  Lab 08/26/21 1747 08/27/21 0533  AST 20 20  ALT 21 19  ALKPHOS 74 67  BILITOT 0.6 0.8  PROT 6.7 6.2*  ALBUMIN 3.7 3.4*   CBG: Recent Labs  Lab 09/01/21 2037 09/01/21 2350 09/02/21 0346 09/02/21 0720 09/02/21 1118  GLUCAP 149* 173* 149* 156* 203*    Discharge time spent: greater than 30 minutes.  Signed: Orson Eva, MD [facility] 09/02/2021

## 2021-09-02 NOTE — Plan of Care (Signed)
Problem: Education: Goal: Ability to demonstrate management of disease process will improve 09/02/2021 1248 by Melony Overly, RN Outcome: Adequate for Discharge 09/02/2021 1248 by Melony Overly, RN Outcome: Adequate for Discharge Goal: Ability to verbalize understanding of medication therapies will improve 09/02/2021 1248 by Melony Overly, RN Outcome: Adequate for Discharge 09/02/2021 1248 by Melony Overly, RN Outcome: Adequate for Discharge Goal: Individualized Educational Video(s) 09/02/2021 1248 by Melony Overly, RN Outcome: Adequate for Discharge 09/02/2021 1248 by Melony Overly, RN Outcome: Adequate for Discharge   Problem: Activity: Goal: Capacity to carry out activities will improve 09/02/2021 1248 by Melony Overly, RN Outcome: Adequate for Discharge 09/02/2021 1248 by Melony Overly, RN Outcome: Adequate for Discharge   Problem: Cardiac: Goal: Ability to achieve and maintain adequate cardiopulmonary perfusion will improve 09/02/2021 1248 by Melony Overly, RN Outcome: Adequate for Discharge 09/02/2021 1248 by Melony Overly, RN Outcome: Adequate for Discharge   Problem: Education: Goal: Knowledge of General Education information will improve Description: Including pain rating scale, medication(s)/side effects and non-pharmacologic comfort measures 09/02/2021 1248 by Melony Overly, RN Outcome: Adequate for Discharge 09/02/2021 1248 by Melony Overly, RN Outcome: Adequate for Discharge   Problem: Health Behavior/Discharge Planning: Goal: Ability to manage health-related needs will improve 09/02/2021 1248 by Melony Overly, RN Outcome: Adequate for Discharge 09/02/2021 1248 by Melony Overly, RN Outcome: Adequate for Discharge   Problem: Clinical Measurements: Goal: Ability to maintain clinical measurements within normal limits will improve 09/02/2021 1248 by Melony Overly, RN Outcome: Adequate for Discharge 09/02/2021 1248 by Melony Overly,  RN Outcome: Adequate for Discharge Goal: Will remain free from infection 09/02/2021 1248 by Melony Overly, RN Outcome: Adequate for Discharge 09/02/2021 1248 by Melony Overly, RN Outcome: Adequate for Discharge Goal: Diagnostic test results will improve 09/02/2021 1248 by Melony Overly, RN Outcome: Adequate for Discharge 09/02/2021 1248 by Melony Overly, RN Outcome: Adequate for Discharge Goal: Respiratory complications will improve 09/02/2021 1248 by Melony Overly, RN Outcome: Adequate for Discharge 09/02/2021 1248 by Melony Overly, RN Outcome: Adequate for Discharge Goal: Cardiovascular complication will be avoided 09/02/2021 1248 by Melony Overly, RN Outcome: Adequate for Discharge 09/02/2021 1248 by Melony Overly, RN Outcome: Adequate for Discharge   Problem: Activity: Goal: Risk for activity intolerance will decrease 09/02/2021 1248 by Melony Overly, RN Outcome: Adequate for Discharge 09/02/2021 1248 by Melony Overly, RN Outcome: Adequate for Discharge   Problem: Nutrition: Goal: Adequate nutrition will be maintained 09/02/2021 1248 by Melony Overly, RN Outcome: Adequate for Discharge 09/02/2021 1248 by Melony Overly, RN Outcome: Adequate for Discharge   Problem: Coping: Goal: Level of anxiety will decrease 09/02/2021 1248 by Melony Overly, RN Outcome: Adequate for Discharge 09/02/2021 1248 by Melony Overly, RN Outcome: Adequate for Discharge   Problem: Elimination: Goal: Will not experience complications related to bowel motility 09/02/2021 1248 by Melony Overly, RN Outcome: Adequate for Discharge 09/02/2021 1248 by Melony Overly, RN Outcome: Adequate for Discharge Goal: Will not experience complications related to urinary retention 09/02/2021 1248 by Melony Overly, RN Outcome: Adequate for Discharge 09/02/2021 1248 by Melony Overly, RN Outcome: Adequate for Discharge   Problem: Pain Managment: Goal: General experience of comfort will  improve 09/02/2021 1248 by Melony Overly, RN Outcome: Adequate for Discharge 09/02/2021 1248 by Melony Overly, RN Outcome: Adequate for Discharge   Problem: Safety: Goal: Ability to remain  free from injury will improve 09/02/2021 1248 by Melony Overly, RN Outcome: Adequate for Discharge 09/02/2021 1248 by Melony Overly, RN Outcome: Adequate for Discharge   Problem: Skin Integrity: Goal: Risk for impaired skin integrity will decrease 09/02/2021 1248 by Melony Overly, RN Outcome: Adequate for Discharge 09/02/2021 1248 by Melony Overly, RN Outcome: Adequate for Discharge

## 2021-09-02 NOTE — Progress Notes (Signed)
Patient has had several incontinent episodes of stool. He is aware when he goes. He has been turned frequently throughout the shift. Heels elevated off bed. HOB elevated >30 degrees.

## 2021-09-02 NOTE — TOC Transition Note (Signed)
Transition of Care [facility]) - CM/SW Discharge Note   Patient Details  Name: Dominic Hodges MRN: [id number] Date of Birth: October 29, 1927  [facility] CM/SW Contact:  Shade Flood, LCSW Phone Number: 09/02/2021, 12:21 PM   Clinical Narrative:     Pt stable for dc to [facility] today per MD. DC clinical sent electronically. RN to call report. Family previously updated by MD/RN.  There are no other TOC needs for dc.  Final next level of care: Skilled Nursing Facility Barriers to Discharge: Barriers Resolved   Patient Goals and CMS Choice Patient states their goals for this hospitalization and ongoing recovery are:: agreeable to SNF CMS Medicare.gov Compare Post Acute Care list provided to:: Patient Represenative (must comment) Choice offered to / list presented to : Adult Children  Discharge Placement              Patient chooses bed at: [facility] Patient to be transferred to facility by: w/c   Patient and family notified of of transfer: 09/02/21  Discharge Plan and Services                                     Social Determinants of Health (SDOH) Interventions     Readmission Risk Interventions    08/30/2021    4:00 PM  Readmission Risk Prevention Plan  Transportation Screening Complete  PCP or Specialist Appt within 5-7 Days Not Complete  Home Care Screening Complete  Medication Review (RN CM) Complete

## 2021-09-04 ENCOUNTER — Non-Acute Institutional Stay (SKILLED_NURSING_FACILITY): Payer: Medicare Other | Admitting: Adult Health

## 2021-09-04 ENCOUNTER — Encounter: Payer: Self-pay | Admitting: Adult Health

## 2021-09-04 DIAGNOSIS — I129 Hypertensive chronic kidney disease with stage 1 through stage 4 chronic kidney disease, or unspecified chronic kidney disease: Secondary | ICD-10-CM | POA: Insufficient documentation

## 2021-09-04 DIAGNOSIS — Z931 Gastrostomy status: Secondary | ICD-10-CM | POA: Diagnosis not present

## 2021-09-04 DIAGNOSIS — N183 Chronic kidney disease, stage 3 unspecified: Secondary | ICD-10-CM

## 2021-09-04 DIAGNOSIS — J9601 Acute respiratory failure with hypoxia: Secondary | ICD-10-CM | POA: Diagnosis not present

## 2021-09-04 DIAGNOSIS — R627 Adult failure to thrive: Secondary | ICD-10-CM | POA: Diagnosis not present

## 2021-09-04 DIAGNOSIS — K5909 Other constipation: Secondary | ICD-10-CM | POA: Diagnosis not present

## 2021-09-04 DIAGNOSIS — E871 Hypo-osmolality and hyponatremia: Secondary | ICD-10-CM

## 2021-09-04 DIAGNOSIS — R1312 Dysphagia, oropharyngeal phase: Secondary | ICD-10-CM | POA: Diagnosis not present

## 2021-09-04 DIAGNOSIS — I5021 Acute systolic (congestive) heart failure: Secondary | ICD-10-CM | POA: Diagnosis not present

## 2021-09-04 DIAGNOSIS — R29818 Other symptoms and signs involving the nervous system: Secondary | ICD-10-CM | POA: Diagnosis not present

## 2021-09-04 DIAGNOSIS — E43 Unspecified severe protein-calorie malnutrition: Secondary | ICD-10-CM

## 2021-09-04 DIAGNOSIS — K567 Ileus, unspecified: Secondary | ICD-10-CM | POA: Diagnosis not present

## 2021-09-04 DIAGNOSIS — K5981 Ogilvie syndrome: Secondary | ICD-10-CM

## 2021-09-04 DIAGNOSIS — R4189 Other symptoms and signs involving cognitive functions and awareness: Secondary | ICD-10-CM

## 2021-09-04 DIAGNOSIS — I4891 Unspecified atrial fibrillation: Secondary | ICD-10-CM

## 2021-09-04 NOTE — Progress Notes (Signed)
Location:  [facility] Room Number: 134 Place of Service:  SNF (31)   CODE STATUS: full   Allergies  Allergen Reactions   Robaxin [Methocarbamol] Other (See Comments)    Mental confusion    Chief Complaint  Patient presents with   Hospitalization Follow-up    HPI:  He is a 88 year old man who has been hospitalized from 08-26-21 through 09-02-21. His medical history includes: CHF; dysphagia has peg tube; neurocognitive deficits. He presented with the chief complaint of peripheral edema. Over the past week he has become heavier with his helpers unable to lift him. He was having increased shortness of breath present. He was being followed by hospice nurse at home. He remains a full code with full scope of treatment.  Acute systolic CHF: IV lasix was transitioned to po torsemide 20 mg twice daily; them changed to daily due to his renal function. He is not a candidate for aggressive therapies/invasive angiography. He does have soft blood pressure readings.  2. Ileus: 08-31-21 ct demonstrated: distended and markedly redundant sigmoid colon with no focal transition point possible ogilvie's. GI felt due to constipation. Was given laxatives  and his tube feeding has been restarted. He is presently on 60 cc per hour. His abdomen is distended without bowel sounds present; no tinkling present. I have spoken with the nutritionist will lower his rate to 30 cc per hour   Acute respiratory failure with hypoxia: initially required BIPAP; due to acute CHF.  New onset atrial fibrillation: family has decided to start him on eliquis.    He will continue to be followed for his chronic illnesses including:  Dysphagia oropharyngeal/ gastrotomy tube in place: Ileus/Ogilvie syndrome/chronic constipation:  Stage 3 chronic kidney disease due to hypertension: . Neurocognitive deficits:    Past Medical History:  Diagnosis Date   Acid reflux    mild-states if doesnt chew food completely he  begins to cough- states has weakness in muscles due to ilieus- has been doing exreci this   Adynamic ileus ([place]) 12/2014   ? anesthesia related per pt   BPH (benign prostatic hyperplasia)    Complication of anesthesia    10/11/15- per pt "had [facility]- in 11/16-related it to possibly anesthesia"   H/O seasonal allergies    Hyperlipidemia    Hypertension     Past Surgical History:  Procedure Laterality Date   APPENDECTOMY     approx 30 years ago/lwb   CATARACT EXTRACTION W/PHACO Left 09/11/2012   Procedure: CATARACT EXTRACTION PHACO AND INTRAOCULAR LENS PLACEMENT ([place]);  Surgeon: Tonny Branch, MD;  Location: [facility];  Service: Ophthalmology;  Laterality: Left;  CDE: 19.52   ESOPHAGOGASTRODUODENOSCOPY (EGD) WITH PROPOFOL N/A 05/03/2020   Procedure: ESOPHAGOGASTRODUODENOSCOPY (EGD) WITH PROPOFOL;  Surgeon: Aviva Signs, MD;  Location: [facility];  Service: General;  Laterality: N/A;   HIP ARTHROPLASTY Right 12/06/2014   Procedure: PARTIAL RIGHT HIP REPLACEMENT;  Surgeon: Carole Civil, MD;  Location: [facility];  Service: Orthopedics;  Laterality: Right;   HIP ARTHROPLASTY Left 03/08/2020   Procedure: ARTHROPLASTY BIPOLAR HIP (HEMIARTHROPLASTY);  Surgeon: Mordecai Rasmussen, MD;  Location: [facility];  Service: Orthopedics;  Laterality: Left;   LUMBAR LAMINECTOMY/DECOMPRESSION MICRODISCECTOMY N/A 10/19/2015   Procedure: CENTRAL DECOMPRESSION LUMBAR LAMINECTOMY FOR SPINAL STENOSIS, L3,L4FORAMINOTOMY BILATERAL L3,L4 NERVE ROOT;  Surgeon: Latanya Maudlin, MD;  Location: [facility];  Service: Orthopedics;  Laterality: N/A;   PEG PLACEMENT N/A 05/03/2020   Procedure: PERCUTANEOUS ENDOSCOPIC GASTROSTOMY (PEG) PLACEMENT;  Surgeon: Aviva Signs, MD;  Location: [facility];  Service: General;  Laterality: N/A;   UMBILICAL HERNIA REPAIR     Approx 10 years ago/lwb   ureteral stone extraction      Social History   Socioeconomic History   Marital status: Married    Spouse name: Not on file    Number of children: Not on file   Years of education: Not on file   Highest education level: Not on file  Occupational History   Not on file  Tobacco Use   Smoking status: Former    Types: Pipe    Quit date: 01/09/1959    Years since quitting: 62.6   Smokeless tobacco: Never   Tobacco comments:    smoked a pipe not sure how long  Vaping Use   Vaping Use: Never used  Substance and Sexual Activity   Alcohol use: Yes    Comment: occasional social drink/lwb   Drug use: No   Sexual activity: Not on file  Other Topics Concern   Not on file  Social History Narrative   Not on file   Social Determinants of Health   Financial Resource Strain: Not on file  Food Insecurity: Not on file  Transportation Needs: Not on file  Physical Activity: Not on file  Stress: Not on file  Social Connections: Not on file  Intimate Partner Violence: Not on file   Family History  Problem Relation Age of Onset   Coronary artery disease Other        no family history of premature CAD      VITAL SIGNS BP 114/68   Pulse 60   Temp (!) 97.2 F (36.2 C)   Resp 20   Ht '6\' 2"'$  (1.88 m)   Wt 164 lb 12.8 oz (74.8 kg)   SpO2 100%   BMI 21.16 kg/m   Outpatient Encounter Medications as of 09/04/2021  Medication Sig   acetaminophen (TYLENOL) 160 MG/5ML elixir Place 325 mg into feeding tube every 6 (six) hours as needed for fever. Give 650 mg   apixaban (ELIQUIS) 5 MG TABS tablet Place 1 tablet (5 mg total) into feeding tube 2 (two) times daily.   aspirin EC 81 MG tablet Take 81 mg by mouth daily. Swallow whole.   Nutritional Supplements (FEEDING SUPPLEMENT, OSMOLITE 1.5 CAL,) LIQD Place 1,000 mLs into feeding tube continuous. Start at 45 cc/hr and increase by 10 cc/hr every 12 hours to goal rate of 60 cc/hr   polyethylene glycol (MIRALAX / GLYCOLAX) 17 g packet Place 17 g into feeding tube daily.   torsemide (DEMADEX) 20 MG tablet Place 1 tablet (20 mg total) into feeding tube daily.   Water For  Irrigation, Sterile (FREE WATER) SOLN Place 200 mLs into feeding tube 4 (four) times daily -  before meals and at bedtime.   No facility-administered encounter medications on file as of 09/04/2021.     SIGNIFICANT DIAGNOSTIC EXAMS  TODAY  08-26-21: chest x-ray:  Cardiomegaly with mild, diffuse bilateral interstitial pulmonary opacity, likely edema. No focal airspace opacity.  08-27-21: 2-d echo:  1. Left ventricular ejection fraction, by estimation, is 45 to 50%. The  left ventricle has mildly decreased function. The left ventricle  demonstrates global hypokinesis. There is moderate asymmetric left  ventricular hypertrophy of the basal-septal  segment. Left ventricular diastolic parameters are indeterminate.   2. Right ventricular systolic function is mildly reduced. The right  ventricular size is mildly enlarged.   3. Left atrial size was  severely dilated.   4. The mitral valve is normal in structure. Trivial mitral valve  regurgitation. No evidence of mitral stenosis.   5. The aortic valve is tricuspid. Aortic valve regurgitation is trivial.  Aortic valve sclerosis/calcification is present, without any evidence of  aortic stenosis.   6. Aortic dilatation noted. There is mild dilatation of the ascending  aorta, measuring 39 mm.   08-27-21: kub:  There is a moderate to marked gaseous distention of right colon measuring up to 10.9 cm in diameter. This may suggest ileus or partial colonic obstruction. There is no significant small bowel dilation. Increased markings in left lower lung field suggest scarring or atelectasis or pneumonia.  08-29-21: chest x-ray:  Patchy infiltrates are seen in both lower lung fields, more so on the left side suggesting atelectasis/pneumonia. Part of this finding may suggest underlying scarring. No significant interval changes are noted.  08-31-21: ct of abdomen and pelvis:  1. 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 2. Extensive diverticulosis with mild focal fat stranding of the sigmoid colon, concerning for early acute diverticulitis. 3. 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 Recommend follow-up MRCP in 2 years. 4. New age indeterminate compression deformity of T12. Correlate for point tenderness. 5.  Aortic Atherosclerosis   09-02-21: kub;  Continued air-filled dilated sigmoid colon, unchanged. No other changes  LABS REVIEWED   08-26-21: wbc 8,2; hgb 13.2; hct 38.0; mcv 90.5 plt 217; glucose 228; bun 36; creat 0.95; k+ 4.5; na++ 121; ca 7.9; gfr>60; protein 6.7; albumin 3.7: BNP 873.0 08-27-21: wbc 8.9; hgb 13.3; hct 38.4; mcv 90.8 plt 203; glucose 96; bun 33; creat 0.94; k+ 3.7; na++ 128; ca 8.2; gfr >60; protein 6.2; albumin 3.4; mag 2.2; tsh 6.083 free t4: 1.30 08-30-21: glucose 78; bun 38; creat 1.29; k+ 3.6; na++ 138; ca 8.8; gfr 52 09-02-21: glucose 150; bun 43; creat 1.36; k+ 3.5; na++ 137; ca 8.6; gfr 49; mag 2.2   Review of Systems  Reason unable to perform ROS: nonverbal.   Physical Exam Constitutional:      General: He is not in acute distress.    Appearance: He is well-developed. He is not diaphoretic.  Neck:     Thyroid: No thyromegaly.  Cardiovascular:     Rate and Rhythm: Normal rate and regular rhythm.     Pulses: Normal pulses.     Heart sounds: Normal heart sounds.  Pulmonary:     Effort: Pulmonary effort is normal. No respiratory distress.     Breath sounds: Normal breath sounds.  Abdominal:     General: Bowel sounds are normal. There is distension.     Palpations: Abdomen is soft.     Tenderness: There is no abdominal tenderness.     Comments: No bowel sounds auscultated  Peg tube present   Musculoskeletal:     Cervical back: Neck supple.     Right lower leg: No edema.     Left lower leg: No edema.     Comments:  Generalized weakness present   Lymphadenopathy:     Cervical: No cervical adenopathy.  Skin:    General: Skin is warm and dry.  Neurological:     Mental Status: He is alert.  Psychiatric:        Mood and Affect: Mood normal.      ASSESSMENT/ PLAN:  TODAY:  Acute systolic CHF (congestive heart failure) EF 40-45% (08-27-21) has been transitioned to torsemide 20 mg daily will monitor his status  2. Dysphagia oropharyngeal/ gastrotomy tube in place: will lower is jevity to 30 cc per hour due to distention and lack of bowel sounds  3. Ileus/Ogilvie syndrome/chronic constipation: will repeat KUB through 09-07-21. Will lower his feeding to 30 cc per hour; will monitor his status. Will continue miralax 17 gm daily   4.  Stage 3 chronic kidney disease due to hypertension: bun 43; creat 1.36 gfr 49  5. New onset atrial fibrillation: heart rate is stable has been started on eliqus 5 mg twice daily per family request   6. Acute respiratory failure with hypoxia: 02 sats are stable will monitor   7. Neurocognitive deficits: is nonverbal at this time.   8. Hyponatremia: from 121; to his last reading of 138  9. Failure to thrive in adult; weight is 164 pounds is on tube feeding; protein 6.2 albumin 3.4   10. Protein calorie malnutrition severe: is on tube feeding. Due to lack of bowel sounds has been reduced to 30 cc per hour. Albumin 3.4 protein 6.2   Will check cmp and mag level    Ok Edwards NP [facility]  call [phone]

## 2021-09-05 ENCOUNTER — Non-Acute Institutional Stay (SKILLED_NURSING_FACILITY): Payer: Medicare Other | Admitting: Internal Medicine

## 2021-09-05 ENCOUNTER — Encounter: Payer: Self-pay | Admitting: Internal Medicine

## 2021-09-05 DIAGNOSIS — I959 Hypotension, unspecified: Secondary | ICD-10-CM | POA: Diagnosis not present

## 2021-09-05 DIAGNOSIS — K567 Ileus, unspecified: Secondary | ICD-10-CM | POA: Diagnosis not present

## 2021-09-05 DIAGNOSIS — I4891 Unspecified atrial fibrillation: Secondary | ICD-10-CM | POA: Diagnosis not present

## 2021-09-05 DIAGNOSIS — E43 Unspecified severe protein-calorie malnutrition: Secondary | ICD-10-CM

## 2021-09-05 DIAGNOSIS — L03116 Cellulitis of left lower limb: Secondary | ICD-10-CM

## 2021-09-05 DIAGNOSIS — R29818 Other symptoms and signs involving the nervous system: Secondary | ICD-10-CM | POA: Diagnosis not present

## 2021-09-05 DIAGNOSIS — I5021 Acute systolic (congestive) heart failure: Secondary | ICD-10-CM

## 2021-09-05 DIAGNOSIS — R4189 Other symptoms and signs involving cognitive functions and awareness: Secondary | ICD-10-CM | POA: Diagnosis not present

## 2021-09-05 NOTE — Assessment & Plan Note (Signed)
BP controlled but soft; no change in antihypertensive medications with continued monitor.

## 2021-09-05 NOTE — Assessment & Plan Note (Signed)
CHF clinically compensated; no NVD or peripheral edema. No change in present cardiac regimen indicated.  

## 2021-09-05 NOTE — Assessment & Plan Note (Signed)
Clinically LLE cellulitis has resolved after full course of Rocephin.

## 2021-09-05 NOTE — Assessment & Plan Note (Signed)
Abdomen remains distended with essentially absent bowel sounds but no definite ileus clinically.  Tube feedings will be decreased and monitor continued.

## 2021-09-05 NOTE — Assessment & Plan Note (Signed)
Vocalization is minimal & is almost a whisper, garbled and unintelligible.  He essentially stares at the interviewer/examiner with blank facies.

## 2021-09-05 NOTE — Patient Instructions (Signed)
See assessment and plan under each diagnosis in the problem list and acutely for this visit 

## 2021-09-05 NOTE — Assessment & Plan Note (Signed)
Current albumin 3.4 and total protein 6.2 but he exhibits limb atrophy and interosseous wasting. GI issues limit PEG tube feeding volume .  Nutrition to consult at SNF.

## 2021-09-05 NOTE — Progress Notes (Unsigned)
NURSING HOME LOCATION:  [facility] ROOM NUMBER:  [id number]  CODE STATUS: Full Code   PCP: Celene Squibb MD  This is a comprehensive admission note to this SNFperformed on this date less than 30 days from date of admission. Included are preadmission medical/surgical history; reconciled medication list; family history; social history and comprehensive review of systems.  Corrections and additions to the records were documented. Comprehensive physical exam was also performed. Additionally a clinical summary was entered for each active diagnosis pertinent to this admission in the Problem List to enhance continuity of care.  HPI: He was hospitalized 8/19 - 09/02/2021 because of weight gain and increasing dyspnea, his daughter reported that he had had significant weight gain over the week PTA which prevented his caretakers from lifting him to transfer him to the commode.  Increased work of breathing was present clinically.  Actually the day of admission hospice nurse was evaluating him for intake but advised him to go to the ER because of the edema, dyspnea, and diaphoresis. 8/19 portable chest x-ray revealed cardiomegaly and interstitial edema.  Echo revealed ejection fraction of 45-50% and global hypokinesis.  Clinically he had acute systolic congestive heart failure with acute respiratory failure.  He required BiPAP for adequate oxygenation.  Demand ischemia was suggested by troponin level of 99.  Hyponatremia was present with a nadir of 121. Course was complicated by new onset A-fib.  Rate was controlled without medications.  Eliquis was initiated as stroke prophylaxis.  Clonidine patch was discontinued because of hypotension.  Also he received albumin 25 g on 8/20. Midodrine was temporarily initiated for the hypotension. LLE cellulitis has been treated with doxycycline PTA; he was transitioned to Rocephin as an inpatient. With diuresis sodium did improve to 137.  Glucoses while  hospitalized were 71-203; no current A1c was documented.  He did exhibit CKD 3A with a GFR nadir of 49 and creatinine peak of 1.36.  Protein/caloric malnutrition was documented with an albumin of 3.4 and total protein 6.2.  He exhibited minimal anemia with a nadir hematocrit of 38.4.  TSH was mildly elevated at 6.083. Because of his advanced age and advanced complex comorbidities he was not felt to be a candidate for aggressive therapies such as invasive angiography.  His IV Lasix was transitioned to torsemide 20 mg p.o. twice daily with subsequent decrease to once daily at discharge because of increasing creatinine.  With the aggressive diuresis he had a negative I/O of 10.1 L. Course was also complicated by ileus; CT of the abdomen/pelvis 8/24 revealed distended and markedly redundant sigmoid colon possibly Ogilvie's.  GI consulted and felt the findings were related to constipation and car thought tics were initiated.  Enteral feedings were reinitiated; GI felt he was stable for discharge. We have care consulted prior to discharge to SNF with palliative care to follow-up there. He was discharged to SNF for PT/OT rehab because of severe global weakness with severe debilitation.  He was able to ambulate with assistance only.  Past medical and surgical history please GERD, BPH, dyslipidemia, hypertension,  Surgeries and procedures include EGD, lumbar laminectomy and microdiscectomy, and PEG placement.  Social history: Per pipe smoker; social alcohol ingestion.  Family history: Noncontributory due to advanced age.   Review of systems not be completed.  He exhibited dysarthria and speech was essentially a whisper with garbled, indiscernible verbalization.  For the most part he was nonverbal simply staring at the examiner with blank facies.  Physical exam:  Pertinent  or positive findings: He appears chronically ill.  He was initially asleep and exhibited hypopnea without snoring or frank apnea.  Upon  awakening he exhibited blank facies with somewhat of a quizzical character pattern alopecia is present.  He is wearing nasal oxygen.  Responses were slow and indiscernible as noted.  Teeth are coated.  Heart rhythm is irregular but rate is controlled.  He has low-grade scattered rhonchi.  Abdomen essentially silent and distended without clinical ileus.  PEG tube is present.  Pedal pulses are decreased.  There is irregular bruising at the upper central chest.  He has eschar and bruising over the forearms.  There is a dressing over the left shin.  This the skin over the shins is dry with eschar and keratoses.  There is atrophy of the calves and interosseous wasting.  He exhibits an intention tremor when he attempted to gesture with the left hand. General appearance: Adequately nourished; no acute distress, increased work of breathing is present.   Lymphatic: No lymphadenopathy about the head, neck, axilla. Eyes: No conjunctival inflammation or lid edema is present. There is no scleral icterus. Ears:  External ear exam shows no significant lesions or deformities.   Nose:  External nasal examination shows no deformity or inflammation. Nasal mucosa are pink and moist without lesions, exudates Oral exam: Lips and gums are healthy appearing.There is no oropharyngeal erythema or exudate. Neck:  No thyromegaly, masses, tenderness noted.    Heart:  Normal rate and regular rhythm. S1 and S2 normal without gallop, murmur, click, rub.  Lungs: Chest clear to auscultation without wheezes, rhonchi, rales, rubs. Abdomen: Bowel sounds are normal.  Abdomen is soft and nontender with no organomegaly, hernias, masses. GU: Deferred  Extremities:  No cyanosis, clubbing, edema. Neurologic exam:  Strength equal  in upper & lower extremities. Balance, Rhomberg, finger to nose testing could not be completed due to clinical state Deep tendon reflexes are equal Skin: Warm & dry w/o tenting. No significant lesions or  rash.  See clinical summary under each active problem in the Problem List with associated updated therapeutic plan

## 2021-09-05 NOTE — Assessment & Plan Note (Signed)
Rate adequately controlled; continue Eliquis prophylaxis.

## 2021-09-06 DIAGNOSIS — R1310 Dysphagia, unspecified: Secondary | ICD-10-CM | POA: Diagnosis not present

## 2021-09-06 DIAGNOSIS — Z515 Encounter for palliative care: Secondary | ICD-10-CM | POA: Diagnosis not present

## 2021-09-06 DIAGNOSIS — E440 Moderate protein-calorie malnutrition: Secondary | ICD-10-CM | POA: Diagnosis not present

## 2021-09-06 DIAGNOSIS — I5021 Acute systolic (congestive) heart failure: Secondary | ICD-10-CM | POA: Diagnosis not present

## 2021-09-07 ENCOUNTER — Encounter: Payer: Self-pay | Admitting: Adult Health

## 2021-09-07 ENCOUNTER — Other Ambulatory Visit (HOSPITAL_COMMUNITY): Payer: Self-pay | Admitting: Adult Health

## 2021-09-07 ENCOUNTER — Ambulatory Visit (HOSPITAL_COMMUNITY)
Admission: RE | Admit: 2021-09-07 | Discharge: 2021-09-07 | Disposition: A | Discharge status: Home / Self Care | Payer: Medicare Other | Source: Ambulatory Visit | Attending: Adult Health | Admitting: Adult Health

## 2021-09-07 ENCOUNTER — Non-Acute Institutional Stay (SKILLED_NURSING_FACILITY): Payer: Medicare Other | Admitting: Adult Health

## 2021-09-07 DIAGNOSIS — K567 Ileus, unspecified: Secondary | ICD-10-CM

## 2021-09-07 DIAGNOSIS — K5909 Other constipation: Secondary | ICD-10-CM | POA: Diagnosis not present

## 2021-09-07 DIAGNOSIS — K6389 Other specified diseases of intestine: Secondary | ICD-10-CM | POA: Diagnosis not present

## 2021-09-07 NOTE — Progress Notes (Signed)
Location:  [facility] Room Number: 134 Place of Service:  SNF (31)   CODE STATUS: full   Allergies  Allergen Reactions   Robaxin [Methocarbamol] Other (See Comments)    Mental confusion    Chief Complaint  Patient presents with   Acute Visit    Follow up on his status     HPI:  He continues with tube feeding at a low rate. He continues to have air filled sigmoid colon with moderate stool burden. He has had bowel movements. He does have bowel sounds left lower quad no tinkling present.   Past Medical History:  Diagnosis Date   Acid reflux    mild-states if doesnt chew food completely he begins to cough- states has weakness in muscles due to ilieus- has been doing exreci this   Adynamic ileus ([place]) 12/2014   ? anesthesia related per pt   BPH (benign prostatic hyperplasia)    Complication of anesthesia    10/11/15- per pt "had [place]- in 11/16-related it to possibly anesthesia"   H/O seasonal allergies    Hyperlipidemia    Hypertension     Past Surgical History:  Procedure Laterality Date   APPENDECTOMY     approx 30 years ago/lwb   CATARACT EXTRACTION W/PHACO Left 09/11/2012   Procedure: CATARACT EXTRACTION PHACO AND INTRAOCULAR LENS PLACEMENT ([place]);  Surgeon: Tonny Branch, MD;  Location: [facility];  Service: Ophthalmology;  Laterality: Left;  CDE: 19.52   ESOPHAGOGASTRODUODENOSCOPY (EGD) WITH PROPOFOL N/A 05/03/2020   Procedure: ESOPHAGOGASTRODUODENOSCOPY (EGD) WITH PROPOFOL;  Surgeon: Aviva Signs, MD;  Location: [facility];  Service: General;  Laterality: N/A;   HIP ARTHROPLASTY Right 12/06/2014   Procedure: PARTIAL RIGHT HIP REPLACEMENT;  Surgeon: Carole Civil, MD;  Location: [facility];  Service: Orthopedics;  Laterality: Right;   HIP ARTHROPLASTY Left 03/08/2020   Procedure: ARTHROPLASTY BIPOLAR HIP (HEMIARTHROPLASTY);  Surgeon: Mordecai Rasmussen, MD;  Location: [facility];  Service: Orthopedics;  Laterality: Left;   LUMBAR  LAMINECTOMY/DECOMPRESSION MICRODISCECTOMY N/A 10/19/2015   Procedure: CENTRAL DECOMPRESSION LUMBAR LAMINECTOMY FOR SPINAL STENOSIS, L3,L4FORAMINOTOMY BILATERAL L3,L4 NERVE ROOT;  Surgeon: Latanya Maudlin, MD;  Location: [facility];  Service: Orthopedics;  Laterality: N/A;   PEG PLACEMENT N/A 05/03/2020   Procedure: PERCUTANEOUS ENDOSCOPIC GASTROSTOMY (PEG) PLACEMENT;  Surgeon: Aviva Signs, MD;  Location: [facility];  Service: General;  Laterality: N/A;   UMBILICAL HERNIA REPAIR     Approx 10 years ago/lwb   ureteral stone extraction      Social History   Socioeconomic History   Marital status: Married    Spouse name: Not on file   Number of children: Not on file   Years of education: Not on file   Highest education level: Not on file  Occupational History   Not on file  Tobacco Use   Smoking status: Former    Types: Pipe    Quit date: 01/09/1959    Years since quitting: 62.7   Smokeless tobacco: Never   Tobacco comments:    smoked a pipe not sure how long  Vaping Use   Vaping Use: Never used  Substance and Sexual Activity   Alcohol use: Yes    Comment: occasional social drink/lwb   Drug use: No   Sexual activity: Not on file  Other Topics Concern   Not on file  Social History Narrative   Not on file   Social Determinants of Health   Financial Resource Strain: Not on file  Food Insecurity: Not on file  Transportation Needs: Not on file  Physical Activity: Not on file  Stress: Not on file  Social Connections: Not on file  Intimate Partner Violence: Not on file   Family History  Problem Relation Age of Onset   Coronary artery disease Other        no family history of premature CAD      VITAL SIGNS BP 100/64   Pulse 80   Temp (!) 97.2 F (36.2 C)   Resp 20   Ht 6' 0.5" (1.842 m)   Wt 164 lb 9.6 oz (74.7 kg)   SpO2 92%   BMI 22.02 kg/m   Outpatient Encounter Medications as of 09/07/2021  Medication Sig   acetaminophen (TYLENOL) 160 MG/5ML elixir Place 325 mg  into feeding tube every 6 (six) hours as needed for fever. Give 650 mg   apixaban (ELIQUIS) 5 MG TABS tablet Place 1 tablet (5 mg total) into feeding tube 2 (two) times daily.   aspirin EC 81 MG tablet Take 81 mg by mouth daily. Swallow whole.   Nutritional Supplements (FEEDING SUPPLEMENT, OSMOLITE 1.5 CAL,) LIQD Place 1,000 mLs into feeding tube continuous. Start at 45 cc/hr and increase by 10 cc/hr every 12 hours to goal rate of 60 cc/hr   polyethylene glycol (MIRALAX / GLYCOLAX) 17 g packet Place 17 g into feeding tube daily.   torsemide (DEMADEX) 20 MG tablet Place 1 tablet (20 mg total) into feeding tube daily.   Water For Irrigation, Sterile (FREE WATER) SOLN Place 200 mLs into feeding tube 4 (four) times daily -  before meals and at bedtime.   No facility-administered encounter medications on file as of 09/07/2021.     SIGNIFICANT DIAGNOSTIC EXAMS  PREVIOUS   08-26-21: chest x-ray:  Cardiomegaly with mild, diffuse bilateral interstitial pulmonary opacity, likely edema. No focal airspace opacity.  08-27-21: 2-d echo:  1. Left ventricular ejection fraction, by estimation, is 45 to 50%. The  left ventricle has mildly decreased function. The left ventricle  demonstrates global hypokinesis. There is moderate asymmetric left  ventricular hypertrophy of the basal-septal  segment. Left ventricular diastolic parameters are indeterminate.   2. Right ventricular systolic function is mildly reduced. The right  ventricular size is mildly enlarged.   3. Left atrial size was severely dilated.   4. The mitral valve is normal in structure. Trivial mitral valve  regurgitation. No evidence of mitral stenosis.   5. The aortic valve is tricuspid. Aortic valve regurgitation is trivial.  Aortic valve sclerosis/calcification is present, without any evidence of  aortic stenosis.   6. Aortic dilatation noted. There is mild dilatation of the ascending  aorta, measuring 39 mm.   08-27-21: kub:  There is a  moderate to marked gaseous distention of right colon measuring up to 10.9 cm in diameter. This may suggest ileus or partial colonic obstruction. There is no significant small bowel dilation. Increased markings in left lower lung field suggest scarring or atelectasis or pneumonia.  08-29-21: chest x-ray:  Patchy infiltrates are seen in both lower lung fields, more so on the left side suggesting atelectasis/pneumonia. Part of this finding may suggest underlying scarring. No significant interval changes are noted.  08-31-21: ct of abdomen and pelvis:  1. 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 2. Extensive diverticulosis with mild focal fat stranding of the sigmoid colon, concerning for early acute diverticulitis. 3. 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 Recommend follow-up MRCP in 2 years. 4. New age indeterminate compression deformity of T12. Correlate for point tenderness. 5.  Aortic Atherosclerosis   TODAY  09-02-21: KUB: continued air filled dilated sigmoid colon without change; no other changes  09-07-21: KUB; no significant change in the air filled sigmoid colon moderate stool burden again seen throughout colon   LABS REVIEWED PREVIOUS   08-26-21: wbc 8,2; hgb 13.2; hct 38.0; mcv 90.5 plt 217; glucose 228; bun 36; creat 0.95; k+ 4.5; na++ 121; ca 7.9; gfr>60; protein 6.7; albumin 3.7: BNP 873.0 08-27-21: wbc 8.9; hgb 13.3; hct 38.4; mcv 90.8 plt 203; glucose 96; bun 33; creat 0.94; k+ 3.7; na++ 128; ca 8.2; gfr >60; protein 6.2; albumin 3.4; mag 2.2; tsh 6.083 free t4: 1.30 08-30-21: glucose 78; bun 38; creat 1.29; k+ 3.6; na++ 138; ca 8.8; gfr 52 09-02-21: glucose 150; bun 43; creat 1.36; k+ 3.5; na++ 137; ca 8.6; gfr 49; mag 2.2   NO NEW LABS.   Review of Systems  Reason unable to perform ROS:  unable to fully participate.   Physical Exam Constitutional:      General: He is not in acute distress.    Appearance: He is well-developed. He is not diaphoretic.  Neck:     Thyroid: No thyromegaly.  Cardiovascular:     Rate and Rhythm: Normal rate and regular rhythm.     Heart sounds: Normal heart sounds.  Pulmonary:     Effort: Pulmonary effort is normal. No respiratory distress.     Breath sounds: Normal breath sounds.  Abdominal:     General: There is distension.     Palpations: Abdomen is soft.     Tenderness: There is no abdominal tenderness.     Comments: Bowel sounds left lower quad; no tinkling present.  Peg tube present   Musculoskeletal:        General: Normal range of motion.     Cervical back: Neck supple.  Lymphadenopathy:     Cervical: No cervical adenopathy.  Skin:    General: Skin is warm and dry.  Neurological:     Mental Status: He is alert.  Psychiatric:        Mood and Affect: Mood normal.      ASSESSMENT/ PLAN:  TODAY  Ileus Chronic constipation  Will begin lactulose 30 mL daily through 09-10-21. Will repeat KUB on 09-11-21    Ok Edwards NP [facility]  call [phone]

## 2021-09-08 ENCOUNTER — Telehealth: Payer: Self-pay | Admitting: Cardiology

## 2021-09-08 NOTE — Telephone Encounter (Signed)
Left message to return call 

## 2021-09-08 NOTE — Telephone Encounter (Signed)
Pt's daughter is requesting a call back to get advice on pt's upcoming appt with PA on 09/18 in [place]. She would like to know if this would be okay for the patient to travel in his fragile state. Please advise.

## 2021-09-11 ENCOUNTER — Encounter (HOSPITAL_COMMUNITY)
Admission: RE | Admit: 2021-09-11 | Discharge: 2021-09-11 | Disposition: A | Discharge status: Home / Self Care | Payer: Medicare Other | Source: Skilled Nursing Facility | Attending: Adult Health | Admitting: Adult Health

## 2021-09-11 DIAGNOSIS — I5021 Acute systolic (congestive) heart failure: Secondary | ICD-10-CM | POA: Insufficient documentation

## 2021-09-11 DIAGNOSIS — U071 COVID-19: Secondary | ICD-10-CM | POA: Insufficient documentation

## 2021-09-11 LAB — COMPREHENSIVE METABOLIC PANEL
ALT: 24 U/L (ref 0–44)
AST: 25 U/L (ref 15–41)
Albumin: 3.3 g/dL — ABNORMAL LOW (ref 3.5–5.0)
Alkaline Phosphatase: 74 U/L (ref 38–126)
Anion gap: 10 (ref 5–15)
BUN: 51 mg/dL — ABNORMAL HIGH (ref 8–23)
CO2: 36 mmol/L — ABNORMAL HIGH (ref 22–32)
Calcium: 8.5 mg/dL — ABNORMAL LOW (ref 8.9–10.3)
Chloride: 94 mmol/L — ABNORMAL LOW (ref 98–111)
Creatinine, Ser: 1.15 mg/dL (ref 0.61–1.24)
GFR, Estimated: 60 mL/min — ABNORMAL LOW (ref >60)
Glucose, Bld: 228 mg/dL — ABNORMAL HIGH (ref 70–99)
Potassium: 3.6 mmol/L (ref 3.5–5.1)
Sodium: 140 mmol/L (ref 135–145)
Total Bilirubin: 1.1 mg/dL (ref 0.3–1.2)
Total Protein: 6.6 g/dL (ref 6.5–8.1)

## 2021-09-11 LAB — MAGNESIUM: Magnesium: 2.6 mg/dL — ABNORMAL HIGH (ref 1.7–2.4)

## 2021-09-12 ENCOUNTER — Ambulatory Visit (HOSPITAL_COMMUNITY)
Admission: RE | Admit: 2021-09-12 | Discharge: 2021-09-12 | Disposition: A | Discharge status: Home / Self Care | Payer: Medicare Other | Source: Ambulatory Visit | Attending: Adult Health | Admitting: Adult Health

## 2021-09-12 ENCOUNTER — Other Ambulatory Visit (HOSPITAL_COMMUNITY): Payer: Self-pay | Admitting: Adult Health

## 2021-09-12 DIAGNOSIS — K567 Ileus, unspecified: Secondary | ICD-10-CM | POA: Insufficient documentation

## 2021-09-12 DIAGNOSIS — M47816 Spondylosis without myelopathy or radiculopathy, lumbar region: Secondary | ICD-10-CM | POA: Diagnosis not present

## 2021-09-12 DIAGNOSIS — M419 Scoliosis, unspecified: Secondary | ICD-10-CM | POA: Diagnosis not present

## 2021-09-12 DIAGNOSIS — K6389 Other specified diseases of intestine: Secondary | ICD-10-CM | POA: Diagnosis not present

## 2021-09-12 NOTE — Telephone Encounter (Signed)
I spoke with daughter and they will go to [facility] office.

## 2021-09-12 NOTE — Telephone Encounter (Signed)
Agree with [facility] appt in order to keep time frame of f/u  Zandra Abts MD

## 2021-09-13 ENCOUNTER — Ambulatory Visit: Payer: Medicare Other | Attending: Physician Assistant | Admitting: Physician Assistant

## 2021-09-13 ENCOUNTER — Encounter: Payer: Self-pay | Admitting: Adult Health

## 2021-09-13 ENCOUNTER — Encounter: Payer: Self-pay | Admitting: Physician Assistant

## 2021-09-13 ENCOUNTER — Non-Acute Institutional Stay (SKILLED_NURSING_FACILITY): Payer: Medicare Other | Admitting: Adult Health

## 2021-09-13 VITALS — BP 118/70 | HR 70 | Ht 72.5 in | Wt 164.0 lb

## 2021-09-13 DIAGNOSIS — I10 Essential (primary) hypertension: Secondary | ICD-10-CM | POA: Insufficient documentation

## 2021-09-13 DIAGNOSIS — I5022 Chronic systolic (congestive) heart failure: Secondary | ICD-10-CM | POA: Insufficient documentation

## 2021-09-13 DIAGNOSIS — J189 Pneumonia, unspecified organism: Secondary | ICD-10-CM

## 2021-09-13 DIAGNOSIS — I959 Hypotension, unspecified: Secondary | ICD-10-CM | POA: Insufficient documentation

## 2021-09-13 DIAGNOSIS — E871 Hypo-osmolality and hyponatremia: Secondary | ICD-10-CM | POA: Insufficient documentation

## 2021-09-13 DIAGNOSIS — I4819 Other persistent atrial fibrillation: Secondary | ICD-10-CM | POA: Diagnosis not present

## 2021-09-13 NOTE — Progress Notes (Signed)
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  He remotely saw Dr. Burt Knack for HTN and had normal renal artery duplex. He was more recently admitted 08/2021 for increasing care requirements, edema, dyspnea, and acute combined CHF. 2D echo showed EF 45-50% with global HK, moderate asymmetric basal-septal hypertrophy, mildly reduced RVSF, mildly enlarged RV, severe LAE, mild dilation of ascending aorta. Additional hospital issues include ileus treated conservatively, acute respiratory failure with hypoxia due to CHF, hypotension requiring midodrine and albumin (with discontinuation of clonidine), cellulitis, mildly elevated troponin felt due to demand ischemia, hyponatremia (nadir 121), new onset AFib. He was treated with IV Lasix eventually transitioned to torsemide. GDMT was limited by soft BPs. He was felt to be overall severely debilitated with dysphagia/PEG tube and medical decline, so hospice was engaged. His afib was managed with rate control and anticoagulation.  He is seen for follow-up today accompanied by his daughter Dominic Hodges. They had requested a closer appointment than [facility] because getting him to outside appointments has been physically challenging. Overall he is doing OK. Denies CP, SOB, palpitations. HR, BP controlled. Participating in minimal Hodges PT. Remains very deconditioned. Daughter reports that his LLE looks a little darker than when he left the hospital. Per hospital notes he had not responded to doxycycline as an outpatient and  required Rocephin in the hospital. He has not had any fevers or chills.  Labwork independently reviewed: 8-09/2021 Mg 2.6, K 3.6, Cr 1.15, albumin 3.3, AST/ALT wnl, TSH 6.6, FT4 1.3, Free T3 wnl, Hgb 13.3, plt 203, hsTrop 62-70 09/2020 LDL 96 (PCP)   Cardiology Studies:   Studies reviewed are outlined and summarized above. Reports included below if pertinent.   2d echo 08/27/21    1. Left ventricular ejection fraction, by estimation, is 45 to 50%. The  left ventricle has mildly decreased function. The left ventricle  demonstrates global hypokinesis. There is moderate asymmetric left  ventricular hypertrophy of the basal-septal  segment. Left ventricular diastolic parameters are indeterminate.   2. Right ventricular systolic function is mildly reduced. The right  ventricular size is mildly enlarged.   3. Left atrial size was severely dilated.   4. The mitral valve is normal in structure. Trivial mitral valve  regurgitation. No evidence of mitral stenosis.   5. The aortic valve is tricuspid. Aortic valve regurgitation is trivial.  Aortic valve sclerosis/calcification is present, without any evidence of  aortic stenosis.   6. Aortic dilatation noted. There is mild dilatation of the ascending  aorta, measuring 39 mm.   Carotid duplex 2020 IMPRESSION: Color duplex indicates minimal heterogeneous and calcified plaque, with no hemodynamically significant stenosis by duplex criteria in the extracranial cerebrovascular circulation.  Renal duplex 2012 no RAS    Past Medical History:  Diagnosis Date   Acid reflux    mild-states if doesnt chew food completely he begins to cough- states has weakness in muscles due to ilieus- has been doing exreci this   Adynamic ileus ([place]) 12/2014   ? anesthesia related per pt  BPH (benign prostatic hyperplasia)    Complication of anesthesia    10/11/15- per pt "had [place]- in 11/16-related it to possibly anesthesia"    H/O seasonal allergies    Heart failure with mid-range ejection fraction (HFmEF) (HCC)    Hyperlipidemia    Hypertension    Hyponatremia    Persistent atrial fibrillation [facility])     Past Surgical History:  Procedure Laterality Date   APPENDECTOMY     approx 30 years ago/lwb   CATARACT EXTRACTION W/PHACO Left 09/11/2012   Procedure: CATARACT EXTRACTION PHACO AND INTRAOCULAR LENS PLACEMENT ([place]);  Surgeon: Tonny Branch, MD;  Location: [facility];  Service: Ophthalmology;  Laterality: Left;  CDE: 19.52   ESOPHAGOGASTRODUODENOSCOPY (EGD) WITH PROPOFOL N/A 05/03/2020   Procedure: ESOPHAGOGASTRODUODENOSCOPY (EGD) WITH PROPOFOL;  Surgeon: Aviva Signs, MD;  Location: [facility];  Service: General;  Laterality: N/A;   HIP ARTHROPLASTY Right 12/06/2014   Procedure: PARTIAL RIGHT HIP REPLACEMENT;  Surgeon: Carole Civil, MD;  Location: [facility];  Service: Orthopedics;  Laterality: Right;   HIP ARTHROPLASTY Left 03/08/2020   Procedure: ARTHROPLASTY BIPOLAR HIP (HEMIARTHROPLASTY);  Surgeon: Mordecai Rasmussen, MD;  Location: [facility];  Service: Orthopedics;  Laterality: Left;   LUMBAR LAMINECTOMY/DECOMPRESSION MICRODISCECTOMY N/A 10/19/2015   Procedure: CENTRAL DECOMPRESSION LUMBAR LAMINECTOMY FOR SPINAL STENOSIS, L3,L4FORAMINOTOMY BILATERAL L3,L4 NERVE ROOT;  Surgeon: Latanya Maudlin, MD;  Location: [facility];  Service: Orthopedics;  Laterality: N/A;   PEG PLACEMENT N/A 05/03/2020   Procedure: PERCUTANEOUS ENDOSCOPIC GASTROSTOMY (PEG) PLACEMENT;  Surgeon: Aviva Signs, MD;  Location: [facility];  Service: General;  Laterality: N/A;   UMBILICAL HERNIA REPAIR     Approx 10 years ago/lwb   ureteral stone extraction      Current Medications: Current Meds  Medication Sig   acetaminophen (TYLENOL) 325 MG tablet Take 650 mg by mouth every 6 (six) hours as needed.   apixaban (ELIQUIS) 5 MG TABS tablet Place 1 tablet (5 mg total) into feeding tube 2 (two) times daily.   aspirin EC 81 MG tablet Take 81 mg by mouth daily. Swallow  whole.   Nutritional Supplements (FEEDING SUPPLEMENT, OSMOLITE 1.5 CAL,) LIQD Place 1,000 mLs into feeding tube continuous. Start at 45 cc/hr and increase by 10 cc/hr every 12 hours to goal rate of 60 cc/hr   polyethylene glycol (MIRALAX / GLYCOLAX) 17 g packet Place 17 g into feeding tube daily.   torsemide (DEMADEX) 20 MG tablet Place 1 tablet (20 mg total) into feeding tube daily.      Allergies:   Robaxin [methocarbamol]   Social History   Socioeconomic History   Marital status: Married    Spouse name: Not on file   Number of children: Not on file   Years of education: Not on file   Highest education Hodges: Not on file  Occupational History   Not on file  Tobacco Use   Smoking status: Former    Types: Pipe    Quit date: 01/09/1959    Years since quitting: 62.7   Smokeless tobacco: Never   Tobacco comments:    smoked a pipe not sure how long  Vaping Use   Vaping Use: Never used  Substance and Sexual Activity   Alcohol use: Yes    Comment: occasional social drink/lwb   Drug use: No   Sexual activity: Not on file  Other Topics Concern   Not on file  Social History Narrative   Not on file   Social Determinants of Health  Financial Resource Strain: Not on file  Food Insecurity: Not on file  Transportation Needs: Not on file  Physical Activity: Not on file  Stress: Not on file  Social Connections: Not on file     Family History:  The patient's family history includes Coronary artery disease in an other family member.  ROS:   Please see the history of present illness.  All other systems are reviewed and otherwise negative.    EKG(s)/Additional Labs   EKG:  EKG is not ordered today  Recent Labs: 08/27/2021: Hemoglobin 13.3; Platelets 203; TSH 6.083 08/29/2021: B Natriuretic Peptide 652.0 09/11/2021: ALT 24; BUN 51; Creatinine, Ser 1.15; Magnesium 2.6; Potassium 3.6; Sodium 140  Recent Lipid Panel    Component Value Date/Time   CHOL 178 07/21/2020 1553    TRIG 93 07/21/2020 1553   HDL 46 07/21/2020 1553   CHOLHDL 3.9 07/21/2020 1553   VLDL 6 10/09/2017 0740   LDLCALC 113 (H) 07/21/2020 1553    PHYSICAL EXAM:    VS:  BP 118/70   Pulse 70   Ht 6' 0.5" (1.842 m)   Wt 164 lb (74.4 kg)   SpO2 98%   BMI 21.94 kg/m   BMI: Body mass index is 21.94 kg/m.  GEN: Frail elderly male in no acute distress HEENT: normocephalic, atraumatic Neck: no JVD, carotid bruits, or masses Cardiac: irregularly irregular, rate controlled; no murmurs, rubs, or gallops, no edema  Respiratory:  clear to auscultation bilaterally, normal work of breathing GI: soft, nontender, nondistended, + BS MS: generalized atrophy Skin: warm and dry, scattered senile purpura with thin skin, hyperpigmentation of the LLE without overt warmth, fluctuance or suppuration Neuro:  Alert and Oriented x 3, Strength and sensation are intact, follows commands Psych: euthymic mood, full affect  Wt Readings from Last 3 Encounters:  09/13/21 164 lb (74.4 kg)  09/13/21 164 lb 9.6 oz (74.7 kg)  09/07/21 164 lb 9.6 oz (74.7 kg)     ASSESSMENT & PLAN:   1. Chronic HFmrEF - volume status looks OK. Guideline directed medical therapy was otherwise limited in the hospital recently by his hypotension. Would hold off on adding anything further at this point, with a focus primarily on symptom management. Continue torsemide at present dose. He is not requiring any rate controlling agents for his afib.  2. Persistent atrial fib - exam c/w rate controlled atrial fib. Given advanced age, comorbidities, controlled HR and general tolerance to afib, rate control strategy is most appropriate. He would likely be a poor candidate for anesthesia/sedation for cardioversion. He will continue Eliquis for now. Need to continue to follow BMET and weight closely in the future to ensure that '5mg'$  BID remains the correct dose. I would recommend to discontinue ASA, no strong indication to continue this and only  increases bleeding risk at this time. F/u CBC today.  3. Essential HTN more recently with hypotension - avoid aggressive BP lowering. Required midodrine, albumin in the hospital. Continue torsemide monotherapy.  4. Hyponatremia - recheck sodium 09/11/21 had normalized.  His daughter also expresses concern that his LLE appears more pigmented than when he was in the hospital. He had a complex course recently. Per hospital notes he had not responded to doxycycline as an outpatient and required Rocephin in the hospital. He has not had any fevers or chills. There is no swelling or fluctuance. We are getting CBC as above. I suggested that the patient request to have this evaluated by the primary medical team at his facility (  daughter aware). I also reviewed with Dr. Harl Bowie who agrees.    Disposition: F/u with APP or Dr. Harl Bowie in 6-8 weeks. [place] location preferred.   Medication Adjustments/Labs and Tests Ordered: Current medicines are reviewed at length with the patient today.  Concerns regarding medicines are outlined above. Medication changes, Labs and Tests ordered today are summarized above and listed in the Patient Instructions accessible in Encounters.    Signed, Charlie Pitter, PA-C  09/13/2021 3:42 PM    [facility] Location in [place]. [place], [place] [address] Ph: [phone]; Fax [phone]

## 2021-09-13 NOTE — Patient Instructions (Addendum)
Medication Instructions:  STOP Aspirin   Labwork: CBC  Testing/Procedures: None today  Follow-Up:  10/23/21 at 3:30 pm with Cadence Kathlen Mody, PA-C  Any Other Special Instructions Will Be Listed Below (If Applicable).  If you need a refill on your cardiac medications before your next appointment, please call your pharmacy.

## 2021-09-13 NOTE — Progress Notes (Signed)
Location:  [facility] Room Number: 195-K Place of Service:  SNF (31) Provider: Ok Edwards, NP  CODE STATUS: FULL CODE  Allergies  Allergen Reactions   Robaxin [Methocarbamol] Other (See Comments)    Mental confusion    Chief Complaint  Patient presents with   Acute Visit    Follow up Chest X-Ray.    HPI:  In previous kub; he has had air filled sigmoid colon with stool burden throughout. Today there is no stool burden; however there is a left lower lobe infiltrate. There are no reports of coughing; no increased shortness of breath; remains on 02; no fevers.   Past Medical History:  Diagnosis Date   Acid reflux    mild-states if doesnt chew food completely he begins to cough- states has weakness in muscles due to ilieus- has been doing exreci this   Adynamic ileus ([place]) 12/2014   ? anesthesia related per pt   BPH (benign prostatic hyperplasia)    Complication of anesthesia    10/11/15- per pt "had [place]- in 11/16-related it to possibly anesthesia"   H/O seasonal allergies    Heart failure with mid-range ejection fraction (HFmEF) (HCC)    Hyperlipidemia    Hypertension    Hyponatremia    Persistent atrial fibrillation [facility])     Past Surgical History:  Procedure Laterality Date   APPENDECTOMY     approx 30 years ago/lwb   CATARACT EXTRACTION W/PHACO Left 09/11/2012   Procedure: CATARACT EXTRACTION PHACO AND INTRAOCULAR LENS PLACEMENT ([place]);  Surgeon: Tonny Branch, MD;  Location: [facility];  Service: Ophthalmology;  Laterality: Left;  CDE: 19.52   ESOPHAGOGASTRODUODENOSCOPY (EGD) WITH PROPOFOL N/A 05/03/2020   Procedure: ESOPHAGOGASTRODUODENOSCOPY (EGD) WITH PROPOFOL;  Surgeon: Aviva Signs, MD;  Location: [facility];  Service: General;  Laterality: N/A;   HIP ARTHROPLASTY Right 12/06/2014   Procedure: PARTIAL RIGHT HIP REPLACEMENT;  Surgeon: Carole Civil, MD;  Location: [facility];  Service: Orthopedics;  Laterality: Right;    HIP ARTHROPLASTY Left 03/08/2020   Procedure: ARTHROPLASTY BIPOLAR HIP (HEMIARTHROPLASTY);  Surgeon: Mordecai Rasmussen, MD;  Location: [facility];  Service: Orthopedics;  Laterality: Left;   LUMBAR LAMINECTOMY/DECOMPRESSION MICRODISCECTOMY N/A 10/19/2015   Procedure: CENTRAL DECOMPRESSION LUMBAR LAMINECTOMY FOR SPINAL STENOSIS, L3,L4FORAMINOTOMY BILATERAL L3,L4 NERVE ROOT;  Surgeon: Latanya Maudlin, MD;  Location: [facility];  Service: Orthopedics;  Laterality: N/A;   PEG PLACEMENT N/A 05/03/2020   Procedure: PERCUTANEOUS ENDOSCOPIC GASTROSTOMY (PEG) PLACEMENT;  Surgeon: Aviva Signs, MD;  Location: [facility];  Service: General;  Laterality: N/A;   UMBILICAL HERNIA REPAIR     Approx 10 years ago/lwb   ureteral stone extraction      Social History   Socioeconomic History   Marital status: Married    Spouse name: Not on file   Number of children: Not on file   Years of education: Not on file   Highest education level: Not on file  Occupational History   Not on file  Tobacco Use   Smoking status: Former    Types: Pipe    Quit date: 01/09/1959    Years since quitting: 62.7   Smokeless tobacco: Never   Tobacco comments:    smoked a pipe not sure how long  Vaping Use   Vaping Use: Never used  Substance and Sexual Activity   Alcohol use: Yes    Comment: occasional social drink/lwb   Drug use: No   Sexual activity: Not on file  Other Topics  Concern   Not on file  Social History Narrative   Not on file   Social Determinants of Health   Financial Resource Strain: Not on file  Food Insecurity: Not on file  Transportation Needs: Not on file  Physical Activity: Not on file  Stress: Not on file  Social Connections: Not on file  Intimate Partner Violence: Not on file   Family History  Problem Relation Age of Onset   Coronary artery disease Other        no family history of premature CAD      VITAL SIGNS BP 122/70   Pulse 66   Temp 98.5 F (36.9 C)   Resp 20   Ht 6' 0.5" (1.842 m)    Wt 164 lb 9.6 oz (74.7 kg)   SpO2 98%   BMI 22.02 kg/m   Outpatient Encounter Medications as of 09/13/2021  Medication Sig   acetaminophen (TYLENOL) 325 MG tablet Take 650 mg by mouth every 6 (six) hours as needed.   apixaban (ELIQUIS) 5 MG TABS tablet Place 1 tablet (5 mg total) into feeding tube 2 (two) times daily.   aspirin EC 81 MG tablet Take 81 mg by mouth daily. Swallow whole.   Nutritional Supplements (FEEDING SUPPLEMENT, OSMOLITE 1.5 CAL,) LIQD Place 1,000 mLs into feeding tube continuous. Start at 45 cc/hr and increase by 10 cc/hr every 12 hours to goal rate of 60 cc/hr   polyethylene glycol (MIRALAX / GLYCOLAX) 17 g packet Place 17 g into feeding tube daily.   torsemide (DEMADEX) 20 MG tablet Place 1 tablet (20 mg total) into feeding tube daily.   [DISCONTINUED] acetaminophen (TYLENOL) 160 MG/5ML elixir Place 325 mg into feeding tube every 6 (six) hours as needed for fever. Give 650 mg   [DISCONTINUED] Water For Irrigation, Sterile (FREE WATER) SOLN Place 200 mLs into feeding tube 4 (four) times daily -  before meals and at bedtime.   No facility-administered encounter medications on file as of 09/13/2021.     SIGNIFICANT DIAGNOSTIC EXAMS  PREVIOUS   08-26-21: chest x-ray:  Cardiomegaly with mild, diffuse bilateral interstitial pulmonary opacity, likely edema. No focal airspace opacity.  08-27-21: 2-d echo:  1. Left ventricular ejection fraction, by estimation, is 45 to 50%. The  left ventricle has mildly decreased function. The left ventricle  demonstrates global hypokinesis. There is moderate asymmetric left  ventricular hypertrophy of the basal-septal  segment. Left ventricular diastolic parameters are indeterminate.   2. Right ventricular systolic function is mildly reduced. The right  ventricular size is mildly enlarged.   3. Left atrial size was severely dilated.   4. The mitral valve is normal in structure. Trivial mitral valve  regurgitation. No evidence of mitral  stenosis.   5. The aortic valve is tricuspid. Aortic valve regurgitation is trivial.  Aortic valve sclerosis/calcification is present, without any evidence of  aortic stenosis.   6. Aortic dilatation noted. There is mild dilatation of the ascending  aorta, measuring 39 mm.   08-27-21: kub:  There is a moderate to marked gaseous distention of right colon measuring up to 10.9 cm in diameter. This may suggest ileus or partial colonic obstruction. There is no significant small bowel dilation. Increased markings in left lower lung field suggest scarring or atelectasis or pneumonia.  08-29-21: chest x-ray:  Patchy infiltrates are seen in both lower lung fields, more so on the left side suggesting atelectasis/pneumonia. Part of this finding may suggest underlying scarring. No significant interval changes are noted.  08-31-21: ct of abdomen and pelvis:  1. 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 2. Extensive diverticulosis with mild focal fat stranding of the sigmoid colon, concerning for early acute diverticulitis. 3. 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 Recommend follow-up MRCP in 2 years. 4. New age indeterminate compression deformity of T12. Correlate for point tenderness. 5.  Aortic Atherosclerosis   TODAY  09-02-21: KUB: continued air filled dilated sigmoid colon without change; no other changes  09-07-21: KUB; no significant change in the air filled sigmoid colon moderate stool burden again seen throughout colon  09-12-21: kub: air filled sigmoid colon no stool burden; left lower lobe infiltrate    LABS REVIEWED PREVIOUS   08-26-21: wbc 8,2; hgb 13.2; hct 38.0; mcv 90.5 plt 217; glucose 228; bun 36; creat 0.95; k+ 4.5; na++ 121; ca 7.9; gfr>60; protein 6.7; albumin 3.7: BNP 873.0 08-27-21: wbc 8.9;  hgb 13.3; hct 38.4; mcv 90.8 plt 203; glucose 96; bun 33; creat 0.94; k+ 3.7; na++ 128; ca 8.2; gfr >60; protein 6.2; albumin 3.4; mag 2.2; tsh 6.083 free t4: 1.30 08-30-21: glucose 78; bun 38; creat 1.29; k+ 3.6; na++ 138; ca 8.8; gfr 52 09-02-21: glucose 150; bun 43; creat 1.36; k+ 3.5; na++ 137; ca 8.6; gfr 49; mag 2.2   TODAY  09-11-21: glucose 228; bun 51; creat 1.15; k+ 3.6; na++ 140; ca 8.5; gfr 60; protein 6.6; albumin 3.3; mag 2.6   Review of Systems  Constitutional:  Negative for malaise/fatigue.  Respiratory:  Negative for cough and shortness of breath.   Cardiovascular:  Negative for chest pain, palpitations and leg swelling.  Gastrointestinal:  Negative for abdominal pain, constipation and heartburn.  Musculoskeletal:  Negative for back pain, joint pain and myalgias.  Skin: Negative.   Neurological:  Negative for dizziness.  Psychiatric/Behavioral:  The patient is not nervous/anxious.     Physical Exam Constitutional:      General: He is not in acute distress.    Appearance: He is well-developed. He is not diaphoretic.  Neck:     Thyroid: No thyromegaly.  Cardiovascular:     Rate and Rhythm: Normal rate and regular rhythm.     Pulses: Normal pulses.     Heart sounds: Normal heart sounds.  Pulmonary:     Effort: Pulmonary effort is normal. No respiratory distress.     Breath sounds: Rhonchi present.  Abdominal:     General: There is distension.     Palpations: Abdomen is soft.     Tenderness: There is no abdominal tenderness.     Comments: Bowel sounds hypoactive  Peg tube present    Musculoskeletal:        General: Normal range of motion.     Cervical back: Neck supple.  Lymphadenopathy:     Cervical: No cervical adenopathy.  Skin:    General: Skin is warm and dry.  Neurological:     Mental Status: He is alert. Mental status is at baseline.  Psychiatric:        Mood and Affect: Mood normal.       ASSESSMENT/ PLAN:  TODAY  Left lower lobe infiltrate :  will begin augmentin 875 mg twice daily through 09-20-21.    Ok Edwards NP [facility]   call [phone]

## 2021-09-14 ENCOUNTER — Other Ambulatory Visit (HOSPITAL_COMMUNITY)
Admission: RE | Admit: 2021-09-14 | Discharge: 2021-09-14 | Disposition: A | Discharge status: Home / Self Care | Payer: Medicare Other | Source: Skilled Nursing Facility | Attending: Internal Medicine | Admitting: Internal Medicine

## 2021-09-14 DIAGNOSIS — I5021 Acute systolic (congestive) heart failure: Secondary | ICD-10-CM | POA: Insufficient documentation

## 2021-09-14 DIAGNOSIS — J189 Pneumonia, unspecified organism: Secondary | ICD-10-CM | POA: Insufficient documentation

## 2021-09-14 LAB — CBC
HCT: 45.7 % (ref 39.0–52.0)
Hemoglobin: 15 g/dL (ref 13.0–17.0)
MCH: 31.3 pg (ref 26.0–34.0)
MCHC: 32.8 g/dL (ref 30.0–36.0)
MCV: 95.2 fL (ref 80.0–100.0)
Platelets: 233 10*3/uL (ref 150–400)
RBC: 4.8 MIL/uL (ref 4.22–5.81)
RDW: 13.2 % (ref 11.5–15.5)
WBC: 8.9 10*3/uL (ref 4.0–10.5)
nRBC: 0 % (ref 0.0–0.2)

## 2021-09-15 ENCOUNTER — Encounter (HOSPITAL_COMMUNITY): Payer: Self-pay

## 2021-09-15 ENCOUNTER — Emergency Department (HOSPITAL_COMMUNITY): Payer: Medicare Other

## 2021-09-15 ENCOUNTER — Emergency Department (HOSPITAL_COMMUNITY)
Admission: EM | Admit: 2021-09-15 | Discharge: 2021-09-15 | Disposition: A | Discharge status: Skilled Nursing Facility | Payer: Medicare Other | Attending: Emergency Medicine | Admitting: Emergency Medicine

## 2021-09-15 ENCOUNTER — Other Ambulatory Visit: Payer: Self-pay

## 2021-09-15 DIAGNOSIS — Z4682 Encounter for fitting and adjustment of non-vascular catheter: Secondary | ICD-10-CM | POA: Diagnosis not present

## 2021-09-15 DIAGNOSIS — R531 Weakness: Secondary | ICD-10-CM | POA: Diagnosis not present

## 2021-09-15 DIAGNOSIS — K9423 Gastrostomy malfunction: Secondary | ICD-10-CM | POA: Insufficient documentation

## 2021-09-15 DIAGNOSIS — Z7901 Long term (current) use of anticoagulants: Secondary | ICD-10-CM | POA: Insufficient documentation

## 2021-09-15 DIAGNOSIS — N281 Cyst of kidney, acquired: Secondary | ICD-10-CM | POA: Diagnosis not present

## 2021-09-15 DIAGNOSIS — K5730 Diverticulosis of large intestine without perforation or abscess without bleeding: Secondary | ICD-10-CM | POA: Diagnosis not present

## 2021-09-15 DIAGNOSIS — K6389 Other specified diseases of intestine: Secondary | ICD-10-CM | POA: Diagnosis not present

## 2021-09-15 DIAGNOSIS — T85598A Other mechanical complication of other gastrointestinal prosthetic devices, implants and grafts, initial encounter: Secondary | ICD-10-CM

## 2021-09-15 DIAGNOSIS — Z931 Gastrostomy status: Secondary | ICD-10-CM | POA: Diagnosis not present

## 2021-09-15 DIAGNOSIS — R109 Unspecified abdominal pain: Secondary | ICD-10-CM | POA: Diagnosis not present

## 2021-09-15 MED ORDER — DIATRIZOATE MEGLUMINE & SODIUM 66-10 % PO SOLN
ORAL | Status: AC
  Filled 2021-09-15: qty 30

## 2021-09-15 NOTE — Discharge Instructions (Signed)
You can use that new tube for your feeding and follow-up as needed

## 2021-09-15 NOTE — ED Triage Notes (Signed)
Patient brought from [facility] after staff report that G-Tube is clogged. Last used on 11-7 per staff. No meds/nutrition via tube today. Patient denies pain or other complaints.

## 2021-09-15 NOTE — ED Provider Notes (Signed)
[facility] EMERGENCY DEPARTMENT Provider Note   CSN: [id number] Arrival date & time: 09/15/21  1453     History {Add pertinent medical, surgical, social history, OB history to HPI:1} Chief Complaint  Patient presents with   G-Tube clogged    Dominic Hodges is a 88 y.o. male.  Patient came to the ED because his feeding tube was clogged   Abdominal Pain      Home Medications Prior to Admission medications   Medication Sig Start Date End Date Taking? Authorizing Provider  acetaminophen (TYLENOL) 325 MG tablet Take 650 mg by mouth every 6 (six) hours as needed.    [provider]  apixaban (ELIQUIS) 5 MG TABS tablet Place 1 tablet (5 mg total) into feeding tube 2 (two) times daily. 09/02/21   Orson Eva, MD  Nutritional Supplements (FEEDING SUPPLEMENT, OSMOLITE 1.5 CAL,) LIQD Place 1,000 mLs into feeding tube continuous. Start at 45 cc/hr and increase by 10 cc/hr every 12 hours to goal rate of 60 cc/hr 09/02/21   Tat, Shanon Brow, MD  polyethylene glycol (MIRALAX / GLYCOLAX) 17 g packet Place 17 g into feeding tube daily. 09/03/21   Orson Eva, MD  torsemide (DEMADEX) 20 MG tablet Place 1 tablet (20 mg total) into feeding tube daily. 09/02/21   Orson Eva, MD      Allergies    Robaxin [methocarbamol]    Review of Systems   Review of Systems  Gastrointestinal:  Positive for abdominal pain.    Physical Exam Updated Vital Signs BP 120/73   Pulse 90   Temp 98.7 F (37.1 C) (Oral)   Resp 17   SpO2 98%  Physical Exam  ED Results / Procedures / Treatments   Labs (all labs ordered are listed, but only abnormal results are displayed) Labs Reviewed - No data to display  EKG None  Radiology DG ABDOMEN PEG TUBE LOCATION  Result Date: 09/15/2021 CLINICAL DATA:  Peg tube placement EXAM: ABDOMEN - 1 VIEW COMPARISON:  CT 09/15/2021 FINDINGS: 30 mL Gastrografin injected in the patient's gastrostomy, a single overhead images obtained. Contrast injection opacifies the  duodenum. No significant contrast in the stomach. Mild diffuse air distension of the bowel. IMPRESSION: Gastrostomy tube projects over the proximal duodenum with contrast only opacifying the duodenal C loop. Electronically Signed   By: Donavan Foil M.D.   On: 09/15/2021 19:15   CT ABDOMEN PELVIS WO CONTRAST  Result Date: 09/15/2021 CLINICAL DATA:  Abdominal pain EXAM: CT ABDOMEN AND PELVIS WITHOUT CONTRAST TECHNIQUE: Multidetector CT imaging of the abdomen and pelvis was performed following the standard protocol without IV contrast. RADIATION DOSE REDUCTION: This exam was performed according to the departmental dose-optimization program which includes automated exposure control, adjustment of the mA and/or kV according to patient size and/or use of iterative reconstruction technique. COMPARISON:  08/31/2021 FINDINGS: Lower chest: There are small linear patchy densities in both lower lung fields. There is mild ectasia of bronchi in the lower lung fields. Coronary artery calcifications are seen. Hepatobiliary: There are few small low-density lesions in liver with no significant interval change, possibly cysts or hemangiomas. There is no dilation of bile ducts. Gallbladder is unremarkable. Pancreas: There is a 1.5 cm smooth marginated nodule in the region of uncinate process of head of the pancreas. This finding has not changed. Pancreas is otherwise unremarkable. Spleen: Unremarkable. Adrenals/Urinary Tract: Adrenals are unremarkable. There is no hydronephrosis. Parapelvic cysts are seen largest in the lower pole of right kidney measuring 3.9 cm. There is  12 mm smooth marginated high density structure in the posteromedial midportion of right kidney. There is peripheral rim of calcifications in is cyst in the lower pole of left kidney. Ureters are not dilated. Urinary bladder is not distended. Stomach/Bowel: Small hiatal hernia is seen. Tip of gastrostomy tube appears to be within the body of the uterus in left  upper abdomen. There is no abnormal fluid collection or pockets of air adjacent to the gastrostomy tube. Small bowel loops are not dilated. Appendix is not seen. Scattered diverticula are seen in colon. Sigmoid colon is less distended. There is no significant wall thickening and colon. There is no pericolic fluid collection. Vascular/Lymphatic: Scattered arterial calcifications are seen. Reproductive: There are small calcifications in prostate. Other: There is no ascites or pneumoperitoneum. A large left inguinal hernia containing fat. Musculoskeletal: There is decrease in height of upper endplates of bodies of S01 and T12 vertebrae with no significant change. There are pockets of air in T11-T12 disc space. Degenerative changes are noted in lower lumbar spine with encroachment of neural foramina at multiple levels. Overall, no significant interval changes are noted. There is previous arthroplasty in both hips. IMPRESSION: There is no evidence of intestinal obstruction or pneumoperitoneum. There is no hydronephrosis. Coronary artery disease. Diverticulosis of colon without signs of diverticulitis. Tip of gastrostomy tube is noted in the lumen of body of stomach. Small hiatal hernia is seen. There is 1.5 cm smooth marginated fluid density structure in the region of uncinate process of head of the pancreas with no interval change. This may suggest a benign cyst or neoplastic process. Follow-up CT in 1 year may be considered if appropriate for the patient's age and clinical condition. Decrease in height of the bodies of T11 and T12 vertebrae has not changed. There are multiple cysts in both kidneys with no significant interval change. There are small linear densities in the lower lung fields suggesting scarring or subsegmental atelectasis. Other findings as described in the body of the report. Electronically Signed   By: Elmer Picker M.D.   On: 09/15/2021 18:23    Procedures Procedures  {Document cardiac  monitor, telemetry assessment procedure when appropriate:1}  Medications Ordered in ED Medications  diatrizoate meglumine-sodium (GASTROGRAFIN) 66-10 % solution ( Gastric Tube Given 09/15/21 1901)    ED Course/ Medical Decision Making/ A&P                           Medical Decision Making Amount and/or Complexity of Data Reviewed Radiology: ordered.  Old feeding tube was removed and a Foley catheter was put in place.  It can be used for his feedings and his medicines.  {Document critical care time when appropriate:1} {Document review of labs and clinical decision tools ie heart score, Chads2Vasc2 etc:1}  {Document your independent review of radiology images, and any outside records:1} {Document your discussion with family members, caretakers, and with consultants:1} {Document social determinants of health affecting pt's care:1} {Document your decision making why or why not admission, treatments were needed:1} Final Clinical Impression(s) / ED Diagnoses Final diagnoses:  None    Rx / DC Orders ED Discharge Orders     None

## 2021-09-18 ENCOUNTER — Non-Acute Institutional Stay (SKILLED_NURSING_FACILITY): Payer: Medicare Other | Admitting: Adult Health

## 2021-09-18 ENCOUNTER — Encounter: Payer: Self-pay | Admitting: Adult Health

## 2021-09-18 ENCOUNTER — Other Ambulatory Visit (HOSPITAL_COMMUNITY): Payer: Self-pay | Admitting: Adult Health

## 2021-09-18 DIAGNOSIS — R1310 Dysphagia, unspecified: Secondary | ICD-10-CM

## 2021-09-18 DIAGNOSIS — Z931 Gastrostomy status: Secondary | ICD-10-CM

## 2021-09-18 NOTE — Progress Notes (Signed)
Location:  [facility] Room Number: 134 Place of Service:  SNF (31)   CODE STATUS: full   Allergies  Allergen Reactions   Robaxin [Methocarbamol] Other (See Comments)    Mental confusion    Chief Complaint  Patient presents with   Acute Visit    Diarrhea     HPI:  He has had a recent ileus. That has resolved. His bowel sounds are now present in all quads. His abdomen remains slightly distended. He is on 55 cc per hour of tube feeding. He is now having diarrhea. He states that he has not had any stools today. He denies any nausea; no abdominal pain. There are no reports of fevers present.   Past Medical History:  Diagnosis Date   Acid reflux    mild-states if doesnt chew food completely he begins to cough- states has weakness in muscles due to ilieus- has been doing exreci this   Adynamic ileus ([place]) 12/2014   ? anesthesia related per pt   BPH (benign prostatic hyperplasia)    Complication of anesthesia    10/11/15- per pt "had [place]- in 11/16-related it to possibly anesthesia"   H/O seasonal allergies    Heart failure with mid-range ejection fraction (HFmEF) (HCC)    Hyperlipidemia    Hypertension    Hyponatremia    Persistent atrial fibrillation [facility])     Past Surgical History:  Procedure Laterality Date   APPENDECTOMY     approx 30 years ago/lwb   CATARACT EXTRACTION W/PHACO Left 09/11/2012   Procedure: CATARACT EXTRACTION PHACO AND INTRAOCULAR LENS PLACEMENT ([facility]);  Surgeon: Tonny Branch, MD;  Location: [facility];  Service: Ophthalmology;  Laterality: Left;  CDE: 19.52   ESOPHAGOGASTRODUODENOSCOPY (EGD) WITH PROPOFOL N/A 05/03/2020   Procedure: ESOPHAGOGASTRODUODENOSCOPY (EGD) WITH PROPOFOL;  Surgeon: Aviva Signs, MD;  Location: [facility];  Service: General;  Laterality: N/A;   HIP ARTHROPLASTY Right 12/06/2014   Procedure: PARTIAL RIGHT HIP REPLACEMENT;  Surgeon: Carole Civil, MD;  Location: [facility];  Service:  Orthopedics;  Laterality: Right;   HIP ARTHROPLASTY Left 03/08/2020   Procedure: ARTHROPLASTY BIPOLAR HIP (HEMIARTHROPLASTY);  Surgeon: Mordecai Rasmussen, MD;  Location: [facility];  Service: Orthopedics;  Laterality: Left;   LUMBAR LAMINECTOMY/DECOMPRESSION MICRODISCECTOMY N/A 10/19/2015   Procedure: CENTRAL DECOMPRESSION LUMBAR LAMINECTOMY FOR SPINAL STENOSIS, L3,L4FORAMINOTOMY BILATERAL L3,L4 NERVE ROOT;  Surgeon: Latanya Maudlin, MD;  Location: [facility];  Service: Orthopedics;  Laterality: N/A;   PEG PLACEMENT N/A 05/03/2020   Procedure: PERCUTANEOUS ENDOSCOPIC GASTROSTOMY (PEG) PLACEMENT;  Surgeon: Aviva Signs, MD;  Location: [facility];  Service: General;  Laterality: N/A;   UMBILICAL HERNIA REPAIR     Approx 10 years ago/lwb   ureteral stone extraction      Social History   Socioeconomic History   Marital status: Married    Spouse name: Not on file   Number of children: Not on file   Years of education: Not on file   Highest education level: Not on file  Occupational History   Not on file  Tobacco Use   Smoking status: Former    Types: Pipe    Quit date: 01/09/1959    Years since quitting: 62.7   Smokeless tobacco: Never   Tobacco comments:    smoked a pipe not sure how long  Vaping Use   Vaping Use: Never used  Substance and Sexual Activity   Alcohol use: Yes    Comment: occasional social drink/lwb  Drug use: No   Sexual activity: Not on file  Other Topics Concern   Not on file  Social History Narrative   Not on file   Social Determinants of Health   Financial Resource Strain: Not on file  Food Insecurity: Not on file  Transportation Needs: Not on file  Physical Activity: Not on file  Stress: Not on file  Social Connections: Not on file  Intimate Partner Violence: Not on file   Family History  Problem Relation Age of Onset   Coronary artery disease Other        no family history of premature CAD      VITAL SIGNS BP (!) 143/75   Pulse 75   Temp 97.6 F (36.4 C)    Resp 20   Ht 6' 0.5" (1.842 m)   Wt 164 lb 9.6 oz (74.7 kg)   SpO2 96%   BMI 22.02 kg/m   Outpatient Encounter Medications as of 09/18/2021  Medication Sig   acetaminophen (TYLENOL) 325 MG tablet Take 650 mg by mouth every 6 (six) hours as needed.   apixaban (ELIQUIS) 5 MG TABS tablet Place 1 tablet (5 mg total) into feeding tube 2 (two) times daily.   Nutritional Supplements (FEEDING SUPPLEMENT, OSMOLITE 1.5 CAL,) LIQD Place 1,000 mLs into feeding tube continuous. Start at 45 cc/hr and increase by 10 cc/hr every 12 hours to goal rate of 60 cc/hr (Patient taking differently: Place 1,000 mLs into feeding tube continuous. Start at 55 cc/hr and increase by 10 cc/hr every 12 hours to goal rate of 60 cc/hr)   polyethylene glycol (MIRALAX / GLYCOLAX) 17 g packet Place 17 g into feeding tube daily.   torsemide (DEMADEX) 20 MG tablet Place 1 tablet (20 mg total) into feeding tube daily.   No facility-administered encounter medications on file as of 09/18/2021.     SIGNIFICANT DIAGNOSTIC EXAMS  PREVIOUS   08-26-21: chest x-ray:  Cardiomegaly with mild, diffuse bilateral interstitial pulmonary opacity, likely edema. No focal airspace opacity.  08-27-21: 2-d echo:  1. Left ventricular ejection fraction, by estimation, is 45 to 50%. The  left ventricle has mildly decreased function. The left ventricle  demonstrates global hypokinesis. There is moderate asymmetric left  ventricular hypertrophy of the basal-septal  segment. Left ventricular diastolic parameters are indeterminate.   2. Right ventricular systolic function is mildly reduced. The right  ventricular size is mildly enlarged.   3. Left atrial size was severely dilated.   4. The mitral valve is normal in structure. Trivial mitral valve  regurgitation. No evidence of mitral stenosis.   5. The aortic valve is tricuspid. Aortic valve regurgitation is trivial.  Aortic valve sclerosis/calcification is present, without any evidence of  aortic  stenosis.   6. Aortic dilatation noted. There is mild dilatation of the ascending  aorta, measuring 39 mm.   08-27-21: kub:  There is a moderate to marked gaseous distention of right colon measuring up to 10.9 cm in diameter. This may suggest ileus or partial colonic obstruction. There is no significant small bowel dilation. Increased markings in left lower lung field suggest scarring or atelectasis or pneumonia.  08-29-21: chest x-ray:  Patchy infiltrates are seen in both lower lung fields, more so on the left side suggesting atelectasis/pneumonia. Part of this finding may suggest underlying scarring. No significant interval changes are noted.  08-31-21: ct of abdomen and pelvis:  1. 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 2. Extensive diverticulosis with mild focal fat stranding of the sigmoid colon, concerning for early acute diverticulitis. 3. 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 Recommend follow-up MRCP in 2 years. 4. New age indeterminate compression deformity of T12. Correlate for point tenderness. 5.  Aortic Atherosclerosis   09-02-21: KUB: continued air filled dilated sigmoid colon without change; no other changes  09-07-21: KUB; no significant change in the air filled sigmoid colon moderate stool burden again seen throughout colon  09-12-21: kub: air filled sigmoid colon no stool burden; left lower lobe infiltrate  NO NEW EXAM     LABS REVIEWED PREVIOUS   08-26-21: wbc 8,2; hgb 13.2; hct 38.0; mcv 90.5 plt 217; glucose 228; bun 36; creat 0.95; k+ 4.5; na++ 121; ca 7.9; gfr>60; protein 6.7; albumin 3.7: BNP 873.0 08-27-21: wbc 8.9; hgb 13.3; hct 38.4; mcv 90.8 plt 203; glucose 96; bun 33; creat 0.94; k+ 3.7; na++ 128; ca 8.2; gfr >60; protein 6.2; albumin 3.4; mag 2.2; tsh 6.083 free t4:  1.30 08-30-21: glucose 78; bun 38; creat 1.29; k+ 3.6; na++ 138; ca 8.8; gfr 52 09-02-21: glucose 150; bun 43; creat 1.36; k+ 3.5; na++ 137; ca 8.6; gfr 49; mag 2.2  09-11-21: glucose 228; bun 51; creat 1.15; k+ 3.6; na++ 140; ca 8.5; gfr 60; protein 6.6; albumin 3.3; mag 2.6   NO NEW LABS.   Review of Systems  Constitutional:  Negative for malaise/fatigue.  Respiratory:  Negative for cough and shortness of breath.   Cardiovascular:  Negative for chest pain, palpitations and leg swelling.  Gastrointestinal:  Positive for diarrhea. Negative for abdominal pain, constipation and heartburn.       Has not had stools today   Musculoskeletal:  Negative for back pain, joint pain and myalgias.  Skin: Negative.   Neurological:  Negative for dizziness.  Psychiatric/Behavioral:  The patient is not nervous/anxious.     Physical Exam Constitutional:      General: He is not in acute distress.    Appearance: He is well-developed. He is not diaphoretic.  Neck:     Thyroid: No thyromegaly.  Cardiovascular:     Rate and Rhythm: Normal rate and regular rhythm.     Pulses: Normal pulses.     Heart sounds: Normal heart sounds.  Pulmonary:     Effort: Pulmonary effort is normal. No respiratory distress.     Breath sounds: Normal breath sounds.  Abdominal:     General: Bowel sounds are normal. There is no distension.     Palpations: Abdomen is soft.     Tenderness: There is no abdominal tenderness.     Comments: Peg tube present Bowel sounds X four quads  Less distention present   Musculoskeletal:        General: Normal range of motion.     Cervical back: Neck supple.  Lymphadenopathy:     Cervical: No cervical adenopathy.  Skin:    General: Skin is warm and dry.  Neurological:     Mental Status: He is alert. Mental status is at baseline.  Psychiatric:        Mood and Affect: Mood normal.       ASSESSMENT/ PLAN:  TODAY  G tube feeding Gastrotomy tube in place  Will stop miralax at  this time and will continue to monitor his status. He may need bulking medications.    Ok Edwards NP [facility]  call  [phone]   

## 2021-09-20 ENCOUNTER — Ambulatory Visit (HOSPITAL_COMMUNITY)
Admission: RE | Admit: 2021-09-20 | Discharge: 2021-09-20 | Disposition: A | Discharge status: Home / Self Care | Payer: Medicare Other | Source: Ambulatory Visit | Attending: Adult Health | Admitting: Adult Health

## 2021-09-20 ENCOUNTER — Telehealth (HOSPITAL_COMMUNITY): Payer: Self-pay | Admitting: *Deleted

## 2021-09-20 DIAGNOSIS — R1310 Dysphagia, unspecified: Secondary | ICD-10-CM | POA: Diagnosis not present

## 2021-09-20 DIAGNOSIS — Z431 Encounter for attention to gastrostomy: Secondary | ICD-10-CM | POA: Diagnosis not present

## 2021-09-20 HISTORY — PX: IR REPLC GASTRO/COLONIC TUBE PERCUT W/FLUORO: IMG2333

## 2021-09-20 MED ORDER — IOHEXOL 300 MG/ML  SOLN
100.0000 mL | Freq: Once | INTRAMUSCULAR | Status: AC | PRN
Start: 2021-09-20 — End: 2021-09-20
  Administered 2021-09-20: 15 mL

## 2021-09-20 NOTE — Procedures (Signed)
Pre procedural Dx: Inadvertent removal of G-tube. Post procedural Dx: Same  Successful fluoroscopic guided replacement of existing 20 Fr gastrostomy tube.   The feeding tube is ready for immediate use.  EBL: None  Complications: None immediate.  Ronny Bacon, MD Pager #: [phone]

## 2021-09-21 ENCOUNTER — Non-Acute Institutional Stay (SKILLED_NURSING_FACILITY): Payer: Medicare Other | Admitting: Adult Health

## 2021-09-21 ENCOUNTER — Encounter: Payer: Self-pay | Admitting: Adult Health

## 2021-09-21 DIAGNOSIS — R29818 Other symptoms and signs involving the nervous system: Secondary | ICD-10-CM

## 2021-09-21 DIAGNOSIS — N183 Chronic kidney disease, stage 3 unspecified: Secondary | ICD-10-CM | POA: Diagnosis not present

## 2021-09-21 DIAGNOSIS — I129 Hypertensive chronic kidney disease with stage 1 through stage 4 chronic kidney disease, or unspecified chronic kidney disease: Secondary | ICD-10-CM | POA: Diagnosis not present

## 2021-09-21 DIAGNOSIS — R4189 Other symptoms and signs involving cognitive functions and awareness: Secondary | ICD-10-CM | POA: Diagnosis not present

## 2021-09-21 DIAGNOSIS — F0150 Vascular dementia without behavioral disturbance: Secondary | ICD-10-CM

## 2021-09-21 NOTE — Progress Notes (Signed)
Location:  [facility] Room Number: 134 Place of Service:  SNF (31) Provider: Ok Edwards, NP  CODE STATUS: FULL CODE  Allergies  Allergen Reactions   Robaxin [Methocarbamol] Other (See Comments)    Mental confusion    Chief Complaint  Patient presents with   Acute Visit    Care plan meeting     HPI:  We have come together for her plan meeting. Family present. BIMS 7/15 mood 0/30. He is nonambulatory with no falls. He requires extensive to dependent assist with his adls. He is incontinent of bladder and bowel. Dietary:  weight is 164.6  pounds. he is npo with tube feeding osmolyte 1.5 at 55 cc per hour.he has had his peg tube replaced yesterday.  Therapy: no standing; moderate assist upper body; max assist for lower body; bed mobility mod assist; sitting tolerance is improving for 10-15 minutes; transfers are dependent using lift. Is using wheelchair 2-4 hours at a time. He will need mechanical lift upon discharge . Activities does not want to participate.  He will continue to be followed for his chronic illnesses including:  Benign hypertension with  chronic kidney disease (CKD) stage III  Vascular dementia without behavioral disturbance  Neurocognitive deficits   Stage 3 chronic kidney disease due to benign hypertension. We have discussed his advanced directives to include cpr. After our discussion his family will discuss among themselves and will let us know their decisions.   Past Medical History:  Diagnosis Date   Acid reflux    mild-states if doesnt chew food completely he begins to cough- states has weakness in muscles due to ilieus- has been doing exreci this   Adynamic ileus ([place]) 12/2014   ? anesthesia related per pt   BPH (benign prostatic hyperplasia)    Complication of anesthesia    10/11/15- per pt "had [place]- in 11/16-related it to possibly anesthesia"   H/O seasonal allergies    Heart failure with mid-range  ejection fraction (HFmEF) (HCC)    Hyperlipidemia    Hypertension    Hyponatremia    Persistent atrial fibrillation [facility])     Past Surgical History:  Procedure Laterality Date   APPENDECTOMY     approx 30 years ago/lwb   CATARACT EXTRACTION W/PHACO Left 09/11/2012   Procedure: CATARACT EXTRACTION PHACO AND INTRAOCULAR LENS PLACEMENT ([place]);  Surgeon: Tonny Branch, MD;  Location: [facility];  Service: Ophthalmology;  Laterality: Left;  CDE: 19.52   ESOPHAGOGASTRODUODENOSCOPY (EGD) WITH PROPOFOL N/A 05/03/2020   Procedure: ESOPHAGOGASTRODUODENOSCOPY (EGD) WITH PROPOFOL;  Surgeon: Aviva Signs, MD;  Location: [facility];  Service: General;  Laterality: N/A;   HIP ARTHROPLASTY Right 12/06/2014   Procedure: PARTIAL RIGHT HIP REPLACEMENT;  Surgeon: Carole Civil, MD;  Location: [facility];  Service: Orthopedics;  Laterality: Right;   HIP ARTHROPLASTY Left 03/08/2020   Procedure: ARTHROPLASTY BIPOLAR HIP (HEMIARTHROPLASTY);  Surgeon: Mordecai Rasmussen, MD;  Location: [facility];  Service: Orthopedics;  Laterality: Left;   IR REPLC GASTRO/COLONIC TUBE PERCUT W/FLUORO  09/20/2021   LUMBAR LAMINECTOMY/DECOMPRESSION MICRODISCECTOMY N/A 10/19/2015   Procedure: CENTRAL DECOMPRESSION LUMBAR LAMINECTOMY FOR SPINAL STENOSIS, L3,L4FORAMINOTOMY BILATERAL L3,L4 NERVE ROOT;  Surgeon: Latanya Maudlin, MD;  Location: [facility];  Service: Orthopedics;  Laterality: N/A;   PEG PLACEMENT N/A 05/03/2020   Procedure: PERCUTANEOUS ENDOSCOPIC GASTROSTOMY (PEG) PLACEMENT;  Surgeon: Aviva Signs, MD;  Location: [facility];  Service: General;  Laterality: N/A;   UMBILICAL HERNIA REPAIR     Approx 10  years ago/lwb   ureteral stone extraction      Social History   Socioeconomic History   Marital status: Married    Spouse name: Not on file   Number of children: Not on file   Years of education: Not on file   Highest education level: Not on file  Occupational History   Not on file  Tobacco Use   Smoking status: Former    Types: Pipe    Quit  date: 01/09/1959    Years since quitting: 62.7   Smokeless tobacco: Never   Tobacco comments:    smoked a pipe not sure how long  Vaping Use   Vaping Use: Never used  Substance and Sexual Activity   Alcohol use: Yes    Comment: occasional social drink/lwb   Drug use: No   Sexual activity: Not on file  Other Topics Concern   Not on file  Social History Narrative   Not on file   Social Determinants of Health   Financial Resource Strain: Not on file  Food Insecurity: Not on file  Transportation Needs: Not on file  Physical Activity: Not on file  Stress: Not on file  Social Connections: Not on file  Intimate Partner Violence: Not on file   Family History  Problem Relation Age of Onset   Coronary artery disease Other        no family history of premature CAD      VITAL SIGNS BP (!) 140/76   Pulse 70   Temp 97.6 F (36.4 C)   Resp 18   Ht 6' 0.5" (1.842 m)   Wt 164 lb 9.6 oz (74.7 kg)   SpO2 95%   BMI 22.02 kg/m   Outpatient Encounter Medications as of 09/21/2021  Medication Sig   acetaminophen (TYLENOL) 325 MG tablet Take 650 mg by mouth every 6 (six) hours as needed.   apixaban (ELIQUIS) 5 MG TABS tablet Place 1 tablet (5 mg total) into feeding tube 2 (two) times daily.   Nutritional Supplements (FEEDING SUPPLEMENT, OSMOLITE 1.5 CAL,) LIQD Place 55 mLs into feeding tube continuous. Every shift; Day, Evening, and Night.   torsemide (DEMADEX) 20 MG tablet Place 1 tablet (20 mg total) into feeding tube daily.   [DISCONTINUED] Nutritional Supplements (FEEDING SUPPLEMENT, OSMOLITE 1.5 CAL,) LIQD Place 1,000 mLs into feeding tube continuous. Start at 45 cc/hr and increase by 10 cc/hr every 12 hours to goal rate of 60 cc/hr (Patient taking differently: Place 1,000 mLs into feeding tube continuous. Start at 55 cc/hr and increase by 10 cc/hr every 12 hours to goal rate of 60 cc/hr)   No facility-administered encounter medications on file as of 09/21/2021.     SIGNIFICANT  DIAGNOSTIC EXAMS  PREVIOUS   08-26-21: chest x-ray:  Cardiomegaly with mild, diffuse bilateral interstitial pulmonary opacity, likely edema. No focal airspace opacity.  08-27-21: 2-d echo:  1. Left ventricular ejection fraction, by estimation, is 45 to 50%. The  left ventricle has mildly decreased function. The left ventricle  demonstrates global hypokinesis. There is moderate asymmetric left  ventricular hypertrophy of the basal-septal  segment. Left ventricular diastolic parameters are indeterminate.   2. Right ventricular systolic function is mildly reduced. The right  ventricular size is mildly enlarged.   3. Left atrial size was severely dilated.   4. The mitral valve is normal in structure. Trivial mitral valve  regurgitation. No evidence of mitral stenosis.   5. The aortic valve is tricuspid. Aortic valve regurgitation is trivial.  Aortic valve sclerosis/calcification is present, without any evidence of  aortic stenosis.   6. Aortic dilatation noted. There is mild dilatation of the ascending  aorta, measuring 39 mm.   08-27-21: kub:  There is a moderate to marked gaseous distention of right colon measuring up to 10.9 cm in diameter. This may suggest ileus or partial colonic obstruction. There is no significant small bowel dilation. Increased markings in left lower lung field suggest scarring or atelectasis or pneumonia.  08-29-21: chest x-ray:  Patchy infiltrates are seen in both lower lung fields, more so on the left side suggesting atelectasis/pneumonia. Part of this finding may suggest underlying scarring. No significant interval changes are noted.  08-31-21: ct of abdomen and pelvis:  1. 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 2. Extensive diverticulosis with mild focal fat stranding of the sigmoid colon, concerning for early acute diverticulitis. 3. 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 Recommend follow-up MRCP in 2 years. 4. New age indeterminate compression deformity of T12. Correlate for point tenderness. 5.  Aortic Atherosclerosis   09-02-21: KUB: continued air filled dilated sigmoid colon without change; no other changes  09-07-21: KUB; no significant change in the air filled sigmoid colon moderate stool burden again seen throughout colon  09-12-21: kub: air filled sigmoid colon no stool burden; left lower lobe infiltrate  NO NEW EXAM     LABS REVIEWED PREVIOUS   08-26-21: wbc 8,2; hgb 13.2; hct 38.0; mcv 90.5 plt 217; glucose 228; bun 36; creat 0.95; k+ 4.5; na++ 121; ca 7.9; gfr>60; protein 6.7; albumin 3.7: BNP 873.0 08-27-21: wbc 8.9; hgb 13.3; hct 38.4; mcv 90.8 plt 203; glucose 96; bun 33; creat 0.94; k+ 3.7; na++ 128; ca 8.2; gfr >60; protein 6.2; albumin 3.4; mag 2.2; tsh 6.083 free t4: 1.30 08-30-21: glucose 78; bun 38; creat 1.29; k+ 3.6; na++ 138; ca 8.8; gfr 52 09-02-21: glucose 150; bun 43; creat 1.36; k+ 3.5; na++ 137; ca 8.6; gfr 49; mag 2.2  09-11-21: glucose 228; bun 51; creat 1.15; k+ 3.6; na++ 140; ca 8.5; gfr 60; protein 6.6; albumin 3.3; mag 2.6   NO NEW LABS.   Review of Systems  Constitutional:  Negative for malaise/fatigue.  Respiratory:  Negative for cough and shortness of breath.   Cardiovascular:  Negative for chest pain, palpitations and leg swelling.  Gastrointestinal:  Negative for abdominal pain, constipation and heartburn.  Musculoskeletal:  Negative for back pain, joint pain and myalgias.  Skin: Negative.   Neurological:  Negative for dizziness.  Psychiatric/Behavioral:  The patient is not nervous/anxious.    Physical Exam Constitutional:      General: He is not in acute distress.    Appearance: He is well-developed. He is not diaphoretic.  Neck:     Thyroid: No thyromegaly.  Cardiovascular:     Rate and Rhythm: Normal rate and  regular rhythm.     Heart sounds: Normal heart sounds.  Pulmonary:     Effort: Pulmonary effort is normal. No respiratory distress.     Breath sounds: Normal breath sounds.  Abdominal:     General: Bowel sounds are normal. There is no distension.     Palpations: Abdomen is soft.     Tenderness: There is no abdominal tenderness.     Comments: Peg tube present   Musculoskeletal:        General:  Normal range of motion.     Cervical back: Neck supple.     Right lower leg: No edema.     Left lower leg: No edema.  Lymphadenopathy:     Cervical: No cervical adenopathy.  Skin:    General: Skin is warm and dry.  Neurological:     Mental Status: He is alert. Mental status is at baseline.  Psychiatric:        Mood and Affect: Mood normal.      ASSESSMENT/ PLAN:  TODAY  Benign hypertension with  chronic kidney disease (CKD) stage III Vascular dementia without behavioral disturbance Neurocognitive deficits  Stage 3 chronic kidney disease due to benign hypertension  Will continue current plan of care Will continue current medications Will continue to monitor his status   Time spent with patient: 40 minutes:( 20 minutes spent with advanced directives: CPR)  medications; plan of care; therapy    Ok Edwards NP [facility]  call [phone]

## 2021-09-25 ENCOUNTER — Ambulatory Visit: Payer: Medicare Other | Admitting: Physician Assistant

## 2021-09-29 ENCOUNTER — Encounter: Payer: Self-pay | Admitting: Adult Health

## 2021-09-29 ENCOUNTER — Non-Acute Institutional Stay (SKILLED_NURSING_FACILITY): Payer: Medicare Other | Admitting: Adult Health

## 2021-09-29 DIAGNOSIS — J9611 Chronic respiratory failure with hypoxia: Secondary | ICD-10-CM

## 2021-09-29 DIAGNOSIS — I5021 Acute systolic (congestive) heart failure: Secondary | ICD-10-CM

## 2021-09-29 DIAGNOSIS — U071 COVID-19: Secondary | ICD-10-CM | POA: Diagnosis not present

## 2021-09-29 DIAGNOSIS — I4891 Unspecified atrial fibrillation: Secondary | ICD-10-CM | POA: Diagnosis not present

## 2021-09-29 DIAGNOSIS — F0150 Vascular dementia without behavioral disturbance: Secondary | ICD-10-CM

## 2021-09-29 DIAGNOSIS — R1312 Dysphagia, oropharyngeal phase: Secondary | ICD-10-CM

## 2021-09-29 NOTE — Progress Notes (Unsigned)
Location:  [facility] Room Number: NO/133/P Place of Service:  SNF (31) Provider:  Durenda Age, DNP, FNP-BC  Patient Care Team: Celene Squibb, MD as PCP - General (Internal Medicine) Harl Bowie Alphonse Guild, MD as PCP - Cardiology (Cardiology) Sinda Du, MD (Internal Medicine)  Extended Emergency Contact Information Primary Emergency Contact: [place], [place] [address] Johnnette Litter of [place] Phone: [phone] Relation: Daughter Secondary Emergency Contact: McInnis,Peggy Address: [address]          [place], [place] [address] Johnnette Litter of [place] Phone: [phone] Relation: Spouse  Code Status:  FULL  Goals of care: Advanced Directive information    09/29/2021   11:02 AM  Advanced Directives  Does Patient Have a Medical Advance Directive? No  Would patient like information on creating a medical advance directive? No - Patient declined     Chief Complaint  Patient presents with   Discharge Note    Patient is for discharge home on 10/02/21 and will have hospice.    HPI:  Pt is a 88 y.o. male who is for discharge home on 10/02/21 and will have hospice care.  He was admitted to [facility] on 09/02/21 post hospital admission 08/26/21 to 8/78/67 for acute systolic CHF.  He had acute respiratory failure due to CHF.  He initially required BiPAP.  Cardiology was consulted.  He was treated with IV Lasix then transitioned to oral Torsemide.  He is not a candidate for aggressive therapies/invasive angiography.  CT abdomen/pelvis, only 08/31/2021, showed distended and markedly redundant sigmoid colon with no focal transition point, possible [place].  GI was consulted and was felt to be due to constipation.  He was started on cathartics.    At [facility], he had short-term rehabilitation. He had PT, OT and ST treatments and was followed by palliative care.  Today, he was reported to have  productive cough. He has continuous O2 @ 2L/min via . Chest x-ray was negative for infiltrates.  Addendum: 10/02/21 Daughter called facility and reported that wife tested positive for COVID-19. Resident was tested and was positive for COVID-19, as well.  He was immediately started on Molnupiravir, Zinc, vitamin C and Vitamin D2.  He will discharge home with hospice care.   Past Medical History:  Diagnosis Date   Acid reflux    mild-states if doesnt chew food completely he begins to cough- states has weakness in muscles due to ilieus- has been doing exreci this   Adynamic ileus ([place]) 12/2014   ? anesthesia related per pt   BPH (benign prostatic hyperplasia)    Complication of anesthesia    10/11/15- per pt "had [place]- in 11/16-related it to possibly anesthesia"   H/O seasonal allergies    Heart failure with mid-range ejection fraction (HFmEF) (HCC)    Hyperlipidemia    Hypertension    Hyponatremia    Persistent atrial fibrillation [facility])    Past Surgical History:  Procedure Laterality Date   APPENDECTOMY     approx 30 years ago/lwb   CATARACT EXTRACTION W/PHACO Left 09/11/2012   Procedure: CATARACT EXTRACTION PHACO AND INTRAOCULAR LENS PLACEMENT ([place]);  Surgeon: Tonny Branch, MD;  Location: [facility] ORS;  Service: Ophthalmology;  Laterality: Left;  CDE: 19.52   ESOPHAGOGASTRODUODENOSCOPY (EGD) WITH PROPOFOL N/A 05/03/2020   Procedure: ESOPHAGOGASTRODUODENOSCOPY (EGD) WITH PROPOFOL;  Surgeon: Aviva Signs, MD;  Location: [facility];  Service: General;  Laterality: N/A;   HIP ARTHROPLASTY Right 12/06/2014   Procedure: PARTIAL RIGHT HIP REPLACEMENT;  Surgeon: Carole Civil, MD;  Location: [facility];  Service: Orthopedics;  Laterality: Right;   HIP ARTHROPLASTY Left 03/08/2020   Procedure: ARTHROPLASTY BIPOLAR HIP (HEMIARTHROPLASTY);  Surgeon: Mordecai Rasmussen, MD;  Location: [facility];  Service: Orthopedics;  Laterality: Left;   IR REPLC GASTRO/COLONIC TUBE PERCUT W/FLUORO   09/20/2021   LUMBAR LAMINECTOMY/DECOMPRESSION MICRODISCECTOMY N/A 10/19/2015   Procedure: CENTRAL DECOMPRESSION LUMBAR LAMINECTOMY FOR SPINAL STENOSIS, L3,L4FORAMINOTOMY BILATERAL L3,L4 NERVE ROOT;  Surgeon: Latanya Maudlin, MD;  Location: [facility];  Service: Orthopedics;  Laterality: N/A;   PEG PLACEMENT N/A 05/03/2020   Procedure: PERCUTANEOUS ENDOSCOPIC GASTROSTOMY (PEG) PLACEMENT;  Surgeon: Aviva Signs, MD;  Location: [facility];  Service: General;  Laterality: N/A;   UMBILICAL HERNIA REPAIR     Approx 10 years ago/lwb   ureteral stone extraction      Allergies  Allergen Reactions   Robaxin [Methocarbamol] Other (See Comments)    Mental confusion    Outpatient Encounter Medications as of 09/29/2021  Medication Sig   acetaminophen (TYLENOL) 325 MG tablet Take 650 mg by mouth every 6 (six) hours as needed.   apixaban (ELIQUIS) 5 MG TABS tablet Place 1 tablet (5 mg total) into feeding tube 2 (two) times daily.   Nutritional Supplements (FEEDING SUPPLEMENT, OSMOLITE 1.5 CAL,) LIQD Place 55 mLs into feeding tube continuous. Every shift; Day, Evening, and Night.   torsemide (DEMADEX) 20 MG tablet Place 1 tablet (20 mg total) into feeding tube daily.   No facility-administered encounter medications on file as of 09/29/2021.    Review of Systems  Constitutional:  Negative for activity change, appetite change and fever.  HENT:  Negative for sore throat.   Eyes: Negative.   Respiratory:  Positive for cough.   Cardiovascular:  Negative for chest pain and leg swelling.  Gastrointestinal:  Negative for abdominal distention, diarrhea and vomiting.  Genitourinary:  Negative for dysuria, frequency and urgency.  Skin:  Negative for color change.  Neurological:  Negative for dizziness and headaches.  Psychiatric/Behavioral:  Negative for behavioral problems and sleep disturbance. The patient is not nervous/anxious.        Immunization History  Administered Date(s) Administered    Influenza,inj,Quad PF,6+ Mos 10/20/2015   Influenza-Unspecified 10/08/2017, 11/12/2019   Moderna SARS-COV2 Booster Vaccination 04/20/2020   Moderna Sars-Covid-2 Vaccination 01/20/2019, 03/02/2019, 12/18/2019   PNEUMOCOCCAL CONJUGATE-20 09/08/2021   Tdap 08/15/2016   Pertinent  Health Maintenance Due  Topic Date Due   INFLUENZA VACCINE  08/08/2021      08/31/2021    8:00 PM 09/01/2021    8:00 AM 09/01/2021   10:00 PM 09/15/2021    2:58 PM 09/29/2021   11:02 AM  Fall Risk  Falls in the past year?     1  Patient Fall Risk Level High fall risk High fall risk High fall risk High fall risk High fall risk  Patient at Risk for Falls Due to     History of fall(s)  Fall risk Follow up     Falls evaluation completed     Vitals:   09/29/21 1103  BP: 118/68  Pulse: 94  Resp: 20  SpO2: 98%  Weight: 164 lb (74.4 kg)  Height: '6\' 1"'$  (1.854 m)   Body mass index is 21.64 kg/m.  Physical Exam Constitutional:      General: He is not in acute distress.    Appearance: Normal appearance.  HENT:  Head: Normocephalic and atraumatic.     Mouth/Throat:     Mouth: Mucous membranes are moist.  Eyes:     Conjunctiva/sclera: Conjunctivae normal.  Cardiovascular:     Rate and Rhythm: Normal rate. Rhythm irregular.     Pulses: Normal pulses.     Heart sounds: Normal heart sounds.  Pulmonary:     Effort: Pulmonary effort is normal.     Breath sounds: Rales present.  Abdominal:     General: Bowel sounds are normal.     Palpations: Abdomen is soft.     Comments: Has peg tube  Musculoskeletal:        General: No swelling.     Cervical back: Normal range of motion.  Skin:    General: Skin is warm and dry.     Comments: Left shin with open wound.  Neurological:     General: No focal deficit present.     Mental Status: He is alert.     Comments: Alert to self and place, disoriented to time.  Psychiatric:        Mood and Affect: Mood normal.        Behavior: Behavior normal.        Thought  Content: Thought content normal.        Judgment: Judgment normal.        Labs reviewed: Recent Labs    09/01/21 0309 09/02/21 0548 09/11/21 0728 10/01/21 1240  NA 136 137 140 145  K 4.2 3.5 3.6 3.2*  CL 92* 90* 94* 100  CO2 35* 35* 36* 32  GLUCOSE 87 150* 228* 257*  BUN 37* 43* 51* 70*  CREATININE 1.16 1.36* 1.15 1.55*  CALCIUM 8.9 8.6* 8.5* 8.0*  MG 2.3 2.2 2.6*  --    Recent Labs    08/26/21 1747 08/27/21 0533 09/11/21 0728  AST '20 20 25  '$ ALT '21 19 24  '$ ALKPHOS 74 67 74  BILITOT 0.6 0.8 1.1  PROT 6.7 6.2* 6.6  ALBUMIN 3.7 3.4* 3.3*   Recent Labs    08/26/21 1747 08/27/21 0533 09/14/21 0555 10/01/21 1240  WBC 8.2 8.9 8.9 7.3  NEUTROABS 7.1 6.8  --   --   HGB 13.2 13.3 15.0 14.4  HCT 38.0* 38.4* 45.7 43.4  MCV 90.5 90.8 95.2 94.3  PLT 217 203 233 147*   Lab Results  Component Value Date   TSH 6.083 (H) 08/27/2021   Lab Results  Component Value Date   HGBA1C 6.2 (H) 09/19/2020   Lab Results  Component Value Date   CHOL 178 07/21/2020   HDL 46 07/21/2020   LDLCALC 113 (H) 07/21/2020   TRIG 93 07/21/2020   CHOLHDL 3.9 07/21/2020    Significant Diagnostic Results in last 30 days:  IR Replc Gastro/Colonic Tube Percut W/Fluoro  Result Date: 09/20/2021 INDICATION: Inadvertent removal of gastrostomy tube. Gastrostomy tube track is maintained with a 20 French Foley catheter. Please perform fluoroscopic guided conversion to a balloon retention gastrostomy tube. EXAM: FLUOROSCOPIC GUIDED REPLACEMENT OF GASTROSTOMY TUBE COMPARISON:  CT abdomen pelvis-09/15/2021 MEDICATIONS: None. CONTRAST:  78m OMNIPAQUE IOHEXOL 300 MG/ML SOLN - administered into the gastric lumen FLUOROSCOPY TIME:  6 seconds (2 mGy) COMPLICATIONS: None immediate. PROCEDURE: A timeout was performed prior to the initiation of the procedure. The existing Foley catheter, utilized to maintain patency of the gastrostomy tube track was removed intact. A 20 French balloon retention gastrostomy tube  was inserted via the gastrostomy tube track. The retention balloon was insufflated with a  small amount of saline and dilute contrast. The external disc was cinched. Contrast was injected and a post procedural spot fluoroscopic image was obtained confirming appropriate positioning and functionality of the new gastrostomy tube. A dressing was applied. The patient tolerated the procedure well without immediate postprocedural complication. IMPRESSION: Successful fluoroscopic guided replacement of a new 20-French gastrostomy tube. The gastrostomy tube is ready for immediate use. Electronically Signed   By: Sandi Mariscal M.D.   On: 09/20/2021 16:38   DG ABDOMEN PEG TUBE LOCATION  Result Date: 09/15/2021 CLINICAL DATA:  Peg tube placement EXAM: ABDOMEN - 1 VIEW COMPARISON:  CT 09/15/2021 FINDINGS: 30 mL Gastrografin injected in the patient's gastrostomy, a single overhead images obtained. Contrast injection opacifies the duodenum. No significant contrast in the stomach. Mild diffuse air distension of the bowel. IMPRESSION: Gastrostomy tube projects over the proximal duodenum with contrast only opacifying the duodenal C loop. Electronically Signed   By: Donavan Foil M.D.   On: 09/15/2021 19:15   CT ABDOMEN PELVIS WO CONTRAST  Result Date: 09/15/2021 CLINICAL DATA:  Abdominal pain EXAM: CT ABDOMEN AND PELVIS WITHOUT CONTRAST TECHNIQUE: Multidetector CT imaging of the abdomen and pelvis was performed following the standard protocol without IV contrast. RADIATION DOSE REDUCTION: This exam was performed according to the departmental dose-optimization program which includes automated exposure control, adjustment of the mA and/or kV according to patient size and/or use of iterative reconstruction technique. COMPARISON:  08/31/2021 FINDINGS: Lower chest: There are small linear patchy densities in both lower lung fields. There is mild ectasia of bronchi in the lower lung fields. Coronary artery calcifications are seen.  Hepatobiliary: There are few small low-density lesions in liver with no significant interval change, possibly cysts or hemangiomas. There is no dilation of bile ducts. Gallbladder is unremarkable. Pancreas: There is a 1.5 cm smooth marginated nodule in the region of uncinate process of head of the pancreas. This finding has not changed. Pancreas is otherwise unremarkable. Spleen: Unremarkable. Adrenals/Urinary Tract: Adrenals are unremarkable. There is no hydronephrosis. Parapelvic cysts are seen largest in the lower pole of right kidney measuring 3.9 cm. There is 12 mm smooth marginated high density structure in the posteromedial midportion of right kidney. There is peripheral rim of calcifications in is cyst in the lower pole of left kidney. Ureters are not dilated. Urinary bladder is not distended. Stomach/Bowel: Small hiatal hernia is seen. Tip of gastrostomy tube appears to be within the body of the uterus in left upper abdomen. There is no abnormal fluid collection or pockets of air adjacent to the gastrostomy tube. Small bowel loops are not dilated. Appendix is not seen. Scattered diverticula are seen in colon. Sigmoid colon is less distended. There is no significant wall thickening and colon. There is no pericolic fluid collection. Vascular/Lymphatic: Scattered arterial calcifications are seen. Reproductive: There are small calcifications in prostate. Other: There is no ascites or pneumoperitoneum. A large left inguinal hernia containing fat. Musculoskeletal: There is decrease in height of upper endplates of bodies of Z12 and T12 vertebrae with no significant change. There are pockets of air in T11-T12 disc space. Degenerative changes are noted in lower lumbar spine with encroachment of neural foramina at multiple levels. Overall, no significant interval changes are noted. There is previous arthroplasty in both hips. IMPRESSION: There is no evidence of intestinal obstruction or pneumoperitoneum. There is no  hydronephrosis. Coronary artery disease. Diverticulosis of colon without signs of diverticulitis. Tip of gastrostomy tube is noted in the lumen of body of stomach. Small  hiatal hernia is seen. There is 1.5 cm smooth marginated fluid density structure in the region of uncinate process of head of the pancreas with no interval change. This may suggest a benign cyst or neoplastic process. Follow-up CT in 1 year may be considered if appropriate for the patient's age and clinical condition. Decrease in height of the bodies of T11 and T12 vertebrae has not changed. There are multiple cysts in both kidneys with no significant interval change. There are small linear densities in the lower lung fields suggesting scarring or subsegmental atelectasis. Other findings as described in the body of the report. Electronically Signed   By: Elmer Picker M.D.   On: 09/15/2021 18:23   DG Abd 1 View  Result Date: 09/13/2021 CLINICAL DATA:  Ileus follow-up. EXAM: ABDOMEN - 1 VIEW COMPARISON:  Abdomen 09/07/2021.  CT 08/31/2021. FINDINGS: Gastrostomy tube noted in stable position. Persistent distention of the sigmoid colon again noted. Colonic gas pattern otherwise unremarkable. No small bowel distention noted. No free air. Lumbar spine scoliosis concave right. Diffuse osteopenia degenerative change lumbar spine. Postsurgical changes both hips again noted. No acute bony abnormality. Left base subsegmental atelectasis and or infiltrate. IMPRESSION: 1. Persistent distention of the sigmoid colon again noted. No significant interim change. 2.  Gastrostomy tube in stable position. 3.  Left base subsegmental atelectasis and or infiltrate. Electronically Signed   By: Marcello Moores  Register M.D.   On: 09/13/2021 07:15   DG Abd 1 View  Result Date: 09/07/2021 CLINICAL DATA:  Ileus. EXAM: ABDOMEN - 1 VIEW COMPARISON:  KUB 09/02/2021; CT abdomen and pelvis 08/31/2021 FINDINGS: Patent of gastrostomy tube again overlies the left upper abdominal  quadrant. There is again gaseous distention of the tortuous sigmoid colon there is again moderate stool seen throughout the ascending, transverse, and descending colon. No definite dilated loops of small bowel are seen to indicate small bowel obstruction. Mild levocurvature centered at L3. Moderate multilevel degenerative disc changes. Bilateral total hip arthroplasty hardware with proximal femoral cerclage wires. IMPRESSION: No significant change in air-filled sigmoid colon. Moderate stool burden again seen throughout the colon. Electronically Signed   By: Yvonne Kendall M.D.   On: 09/07/2021 11:32   DG Abd 1 View  Result Date: 09/02/2021 CLINICAL DATA:  Abdominal distension. EXAM: ABDOMEN - 1 VIEW COMPARISON:  August 29, 2021 KUB. FINDINGS: Sigmoid colon dilatation is unchanged since previous CT imaging. Moderate fecal loading in the colon. A PEG tube is identified in the left upper quadrant. No other interval changes. IMPRESSION: Continued air-filled dilated sigmoid colon, unchanged. No other changes. Electronically Signed   By: Dorise Bullion III M.D.   On: 09/02/2021 09:59    Assessment/Plan  1. Acute systolic CHF (congestive heart failure) (HCC) -Not a candidate for aggressive therapies/invasive angiography -   Will have hospice care at home - torsemide (DEMADEX) 20 MG tablet; Place 1 tablet (20 mg total) into feeding tube daily.  Dispense: 30 tablet; Refill: 0  2. New onset atrial fibrillation (HCC) - apixaban (ELIQUIS) 5 MG TABS tablet; Place 1 tablet (5 mg total) into feeding tube 2 (two) times daily.  Dispense: 60 tablet; Refill: 0  3. Chronic respiratory failure with hypoxia (HCC) -  continue O2 @ 2L/min via [place] continuously  4. COVID-19 - ascorbic acid (VITAMIN C) 500 MG tablet; Place 1 tablet (500 mg total) into feeding tube 2 (two) times daily for 13 days.  Dispense: 26 tablet; Refill: 0 - guaiFENesin (ROBITUSSIN) 100 MG/5ML liquid; Place 10 mLs into feeding  tube in the morning, at  noon, and at bedtime for 11 days.  Dispense: 330 mL; Refill: 0 - molnupiravir EUA (LAGEVRIO) 200 mg CAPS capsule; Give 4 capsules = 800 mg Via peg tube twice a day X 5 days  Dispense: 40 capsule; Refill: 0 - ergocalciferol (VITAMIN D2) 1.25 MG (50000 UT) capsule; Place 1 capsule (50,000 Units total) into feeding tube once a week for 2 doses.  Dispense: 2 capsule; Refill: 0 - zinc gluconate 50 MG tablet; Place 1 tablet (50 mg total) into feeding tube daily.  Dispense: 30 tablet; Refill: 0  5. Vascular dementia without behavioral disturbance ([place]) -  will have hospice care at home  6. Dysphagia, oropharyngeal -  has bolus tube feeding with Osmolite 1.5, 320 ml @ 8am, 12pm, 4pm and 8pm -  aspiration precautions    I have e-prescribed medications.  Patient will have hospice care at home.   Total discharge time: Greater than 30 minutes  Discharge time involved coordination of the discharge process with Education officer, museum and nursing staff.    Durenda Age, DNP, MSN, FNP-BC [facility] [phone] (Monday-Friday 8:00 a.m. - 5:00 p.m.) [phone] (after hours)

## 2021-10-01 ENCOUNTER — Encounter (HOSPITAL_COMMUNITY)
Admission: RE | Admit: 2021-10-01 | Discharge: 2021-10-01 | Disposition: A | Discharge status: Home / Self Care | Payer: Medicare Other | Source: Skilled Nursing Facility | Attending: Adult Health | Admitting: Adult Health

## 2021-10-01 DIAGNOSIS — I5022 Chronic systolic (congestive) heart failure: Secondary | ICD-10-CM | POA: Diagnosis not present

## 2021-10-01 DIAGNOSIS — A4189 Other specified sepsis: Secondary | ICD-10-CM | POA: Diagnosis present

## 2021-10-01 DIAGNOSIS — E872 Acidosis, unspecified: Secondary | ICD-10-CM | POA: Diagnosis present

## 2021-10-01 DIAGNOSIS — R918 Other nonspecific abnormal finding of lung field: Secondary | ICD-10-CM | POA: Diagnosis not present

## 2021-10-01 DIAGNOSIS — J9811 Atelectasis: Secondary | ICD-10-CM | POA: Diagnosis not present

## 2021-10-01 DIAGNOSIS — R0689 Other abnormalities of breathing: Secondary | ICD-10-CM | POA: Diagnosis not present

## 2021-10-01 DIAGNOSIS — Z87891 Personal history of nicotine dependence: Secondary | ICD-10-CM | POA: Diagnosis not present

## 2021-10-01 DIAGNOSIS — Z8249 Family history of ischemic heart disease and other diseases of the circulatory system: Secondary | ICD-10-CM | POA: Diagnosis not present

## 2021-10-01 DIAGNOSIS — J690 Pneumonitis due to inhalation of food and vomit: Secondary | ICD-10-CM | POA: Diagnosis not present

## 2021-10-01 DIAGNOSIS — Z7189 Other specified counseling: Secondary | ICD-10-CM | POA: Diagnosis not present

## 2021-10-01 DIAGNOSIS — J189 Pneumonia, unspecified organism: Secondary | ICD-10-CM | POA: Diagnosis present

## 2021-10-01 DIAGNOSIS — I480 Paroxysmal atrial fibrillation: Secondary | ICD-10-CM | POA: Diagnosis not present

## 2021-10-01 DIAGNOSIS — E870 Hyperosmolality and hypernatremia: Secondary | ICD-10-CM | POA: Diagnosis present

## 2021-10-01 DIAGNOSIS — R0902 Hypoxemia: Secondary | ICD-10-CM | POA: Diagnosis not present

## 2021-10-01 DIAGNOSIS — E785 Hyperlipidemia, unspecified: Secondary | ICD-10-CM | POA: Diagnosis present

## 2021-10-01 DIAGNOSIS — E11649 Type 2 diabetes mellitus with hypoglycemia without coma: Secondary | ICD-10-CM | POA: Diagnosis not present

## 2021-10-01 DIAGNOSIS — E1122 Type 2 diabetes mellitus with diabetic chronic kidney disease: Secondary | ICD-10-CM | POA: Diagnosis present

## 2021-10-01 DIAGNOSIS — Z515 Encounter for palliative care: Secondary | ICD-10-CM | POA: Diagnosis not present

## 2021-10-01 DIAGNOSIS — N1832 Chronic kidney disease, stage 3b: Secondary | ICD-10-CM | POA: Diagnosis present

## 2021-10-01 DIAGNOSIS — A419 Sepsis, unspecified organism: Secondary | ICD-10-CM | POA: Diagnosis not present

## 2021-10-01 DIAGNOSIS — F05 Delirium due to known physiological condition: Secondary | ICD-10-CM | POA: Diagnosis not present

## 2021-10-01 DIAGNOSIS — E1165 Type 2 diabetes mellitus with hyperglycemia: Secondary | ICD-10-CM | POA: Diagnosis present

## 2021-10-01 DIAGNOSIS — E43 Unspecified severe protein-calorie malnutrition: Secondary | ICD-10-CM | POA: Diagnosis present

## 2021-10-01 DIAGNOSIS — J9601 Acute respiratory failure with hypoxia: Secondary | ICD-10-CM | POA: Diagnosis not present

## 2021-10-01 DIAGNOSIS — R0602 Shortness of breath: Secondary | ICD-10-CM | POA: Diagnosis not present

## 2021-10-01 DIAGNOSIS — I517 Cardiomegaly: Secondary | ICD-10-CM | POA: Diagnosis not present

## 2021-10-01 DIAGNOSIS — R6520 Severe sepsis without septic shock: Secondary | ICD-10-CM | POA: Diagnosis not present

## 2021-10-01 DIAGNOSIS — I509 Heart failure, unspecified: Secondary | ICD-10-CM | POA: Diagnosis not present

## 2021-10-01 DIAGNOSIS — Y95 Nosocomial condition: Secondary | ICD-10-CM | POA: Diagnosis present

## 2021-10-01 DIAGNOSIS — Z66 Do not resuscitate: Secondary | ICD-10-CM | POA: Diagnosis present

## 2021-10-01 DIAGNOSIS — R1312 Dysphagia, oropharyngeal phase: Secondary | ICD-10-CM | POA: Diagnosis not present

## 2021-10-01 DIAGNOSIS — E876 Hypokalemia: Secondary | ICD-10-CM | POA: Diagnosis present

## 2021-10-01 DIAGNOSIS — I130 Hypertensive heart and chronic kidney disease with heart failure and stage 1 through stage 4 chronic kidney disease, or unspecified chronic kidney disease: Secondary | ICD-10-CM | POA: Diagnosis present

## 2021-10-01 DIAGNOSIS — R0603 Acute respiratory distress: Secondary | ICD-10-CM | POA: Diagnosis not present

## 2021-10-01 DIAGNOSIS — R739 Hyperglycemia, unspecified: Secondary | ICD-10-CM | POA: Diagnosis not present

## 2021-10-01 DIAGNOSIS — I959 Hypotension, unspecified: Secondary | ICD-10-CM | POA: Diagnosis not present

## 2021-10-01 DIAGNOSIS — I4819 Other persistent atrial fibrillation: Secondary | ICD-10-CM | POA: Diagnosis present

## 2021-10-01 DIAGNOSIS — N179 Acute kidney failure, unspecified: Secondary | ICD-10-CM | POA: Diagnosis not present

## 2021-10-01 DIAGNOSIS — U071 COVID-19: Secondary | ICD-10-CM | POA: Diagnosis not present

## 2021-10-01 DIAGNOSIS — E860 Dehydration: Secondary | ICD-10-CM | POA: Diagnosis present

## 2021-10-01 LAB — CBC
HCT: 43.4 % (ref 39.0–52.0)
Hemoglobin: 14.4 g/dL (ref 13.0–17.0)
MCH: 31.3 pg (ref 26.0–34.0)
MCHC: 33.2 g/dL (ref 30.0–36.0)
MCV: 94.3 fL (ref 80.0–100.0)
Platelets: 147 10*3/uL — ABNORMAL LOW (ref 150–400)
RBC: 4.6 MIL/uL (ref 4.22–5.81)
RDW: 14.6 % (ref 11.5–15.5)
WBC: 7.3 10*3/uL (ref 4.0–10.5)
nRBC: 0 % (ref 0.0–0.2)

## 2021-10-01 LAB — BASIC METABOLIC PANEL
Anion gap: 13 (ref 5–15)
BUN: 70 mg/dL — ABNORMAL HIGH (ref 8–23)
CO2: 32 mmol/L (ref 22–32)
Calcium: 8 mg/dL — ABNORMAL LOW (ref 8.9–10.3)
Chloride: 100 mmol/L (ref 98–111)
Creatinine, Ser: 1.55 mg/dL — ABNORMAL HIGH (ref 0.61–1.24)
GFR, Estimated: 42 mL/min — ABNORMAL LOW (ref >60)
Glucose, Bld: 257 mg/dL — ABNORMAL HIGH (ref 70–99)
Potassium: 3.2 mmol/L — ABNORMAL LOW (ref 3.5–5.1)
Sodium: 145 mmol/L (ref 135–145)

## 2021-10-01 LAB — D-DIMER, QUANTITATIVE: D-Dimer, Quant: 0.44 ug/mL-FEU (ref 0.00–0.50)

## 2021-10-01 LAB — C-REACTIVE PROTEIN: CRP: 25.4 mg/dL — ABNORMAL HIGH (ref <1.0)

## 2021-10-02 ENCOUNTER — Non-Acute Institutional Stay (SKILLED_NURSING_FACILITY): Payer: Medicare Other | Admitting: Internal Medicine

## 2021-10-02 ENCOUNTER — Encounter: Payer: Self-pay | Admitting: Internal Medicine

## 2021-10-02 DIAGNOSIS — U071 COVID-19: Secondary | ICD-10-CM | POA: Diagnosis not present

## 2021-10-02 DIAGNOSIS — J9601 Acute respiratory failure with hypoxia: Secondary | ICD-10-CM | POA: Diagnosis not present

## 2021-10-02 MED ORDER — TORSEMIDE 20 MG PO TABS: 20.0000 mg | ORAL_TABLET | Freq: Every day | ORAL | 0 refills | Status: DC

## 2021-10-02 MED ORDER — GUAIFENESIN 100 MG/5ML PO LIQD: 10.0000 mL | Freq: Three times a day (TID) | ORAL | 0 refills | Status: AC

## 2021-10-02 MED ORDER — ASCORBIC ACID 500 MG PO TABS: 500.0000 mg | ORAL_TABLET | Freq: Two times a day (BID) | ORAL | 0 refills | Status: AC

## 2021-10-02 MED ORDER — ERGOCALCIFEROL 1.25 MG (50000 UT) PO CAPS: 50000.0000 [IU] | ORAL_CAPSULE | ORAL | 0 refills | Status: DC

## 2021-10-02 MED ORDER — FLUOROMETHOLONE ACETATE 0.1 % OP SUSP: 1.0000 [drp] | Freq: Two times a day (BID) | OPHTHALMIC | 0 refills | Status: DC

## 2021-10-02 MED ORDER — MOLNUPIRAVIR EUA 200MG CAPSULE: ORAL_CAPSULE | ORAL | 0 refills | Status: DC

## 2021-10-02 MED ORDER — ZINC GLUCONATE 50 MG PO TABS: 50.0000 mg | ORAL_TABLET | Freq: Every day | ORAL | 0 refills | Status: DC

## 2021-10-02 MED ORDER — APIXABAN 5 MG PO TABS: 5.0000 mg | ORAL_TABLET | Freq: Two times a day (BID) | ORAL | 0 refills | Status: DC

## 2021-10-02 NOTE — Progress Notes (Signed)
   NURSING HOME LOCATION:  [facility] ROOM NUMBER:  [id number] P  CODE STATUS:  Full Code  PCP: Celene Squibb MD  This is a nursing facility follow up visit for specific acute issue of + SARS screen.  Interim medical record and care since last SNF visit was updated with review of diagnostic studies and change in clinical status since last visit were documented.  HPI:  Review of systems: Dementia invalidated responses. Date given as   Constitutional: No fever, significant weight change, fatigue  Eyes: No redness, discharge, pain, vision change ENT/mouth: No nasal congestion,  purulent discharge, earache, change in hearing, sore throat  Cardiovascular: No chest pain, palpitations, paroxysmal nocturnal dyspnea, claudication, edema  Respiratory: No cough, sputum production, hemoptysis, DOE, significant snoring, apnea   Gastrointestinal: No heartburn, dysphagia, abdominal pain, nausea /vomiting, rectal bleeding, melena, change in bowels Genitourinary: No dysuria, hematuria, pyuria, incontinence, nocturia Musculoskeletal: No joint stiffness, joint swelling, weakness, pain Dermatologic: No rash, pruritus, change in appearance of skin Neurologic: No dizziness, headache, syncope, seizures, numbness, tingling Psychiatric: No significant anxiety, depression, insomnia, anorexia Endocrine: No change in hair/skin/nails, excessive thirst, excessive hunger, excessive urination  Hematologic/lymphatic: No significant bruising, lymphadenopathy, abnormal bleeding Allergy/immunology: No itchy/watery eyes, significant sneezing, urticaria, angioedema  Physical exam:  Pertinent or positive findings: General appearance: Adequately nourished; no acute distress, increased work of breathing is present.   Lymphatic: No lymphadenopathy about the head, neck, axilla. Eyes: No conjunctival inflammation or lid edema is present. There is no scleral icterus. Ears:  External ear exam shows no significant  lesions or deformities.   Nose:  External nasal examination shows no deformity or inflammation. Nasal mucosa are pink and moist without lesions, exudates Oral exam:  Lips and gums are healthy appearing. There is no oropharyngeal erythema or exudate. Neck:  No thyromegaly, masses, tenderness noted.    Heart:  Normal rate and regular rhythm. S1 and S2 normal without gallop, murmur, click, rub .  Lungs: Chest clear to auscultation without wheezes, rhonchi, rales, rubs. Abdomen: Bowel sounds are normal. Abdomen is soft and nontender with no organomegaly, hernias, masses. GU: Deferred  Extremities:  No cyanosis, clubbing, edema  Neurologic exam : Cn 2-7 intact Strength equal  in upper & lower extremities Balance, Rhomberg, finger to nose testing could not be completed due to clinical state Deep tendon reflexes are equal Skin: Warm & dry w/o tenting. No significant lesions or rash.  See summary under each active problem in the Problem List with associated updated therapeutic plan

## 2021-10-03 ENCOUNTER — Inpatient Hospital Stay (HOSPITAL_COMMUNITY)
Admission: EM | Admit: 2021-10-03 | Discharge: 2021-10-11 | DRG: 871 | Disposition: A | Discharge status: Home / Self Care | Payer: Medicare Other | Attending: Family Medicine | Admitting: Family Medicine

## 2021-10-03 ENCOUNTER — Emergency Department (HOSPITAL_COMMUNITY): Payer: Medicare Other

## 2021-10-03 ENCOUNTER — Inpatient Hospital Stay (HOSPITAL_COMMUNITY): Payer: Medicare Other

## 2021-10-03 ENCOUNTER — Encounter (HOSPITAL_COMMUNITY): Payer: Self-pay | Admitting: Emergency Medicine

## 2021-10-03 ENCOUNTER — Other Ambulatory Visit: Payer: Self-pay

## 2021-10-03 DIAGNOSIS — E876 Hypokalemia: Secondary | ICD-10-CM | POA: Diagnosis present

## 2021-10-03 DIAGNOSIS — E870 Hyperosmolality and hypernatremia: Secondary | ICD-10-CM | POA: Diagnosis present

## 2021-10-03 DIAGNOSIS — R6520 Severe sepsis without septic shock: Secondary | ICD-10-CM | POA: Diagnosis present

## 2021-10-03 DIAGNOSIS — E1122 Type 2 diabetes mellitus with diabetic chronic kidney disease: Secondary | ICD-10-CM | POA: Diagnosis present

## 2021-10-03 DIAGNOSIS — E872 Acidosis, unspecified: Secondary | ICD-10-CM | POA: Diagnosis present

## 2021-10-03 DIAGNOSIS — Z66 Do not resuscitate: Secondary | ICD-10-CM | POA: Diagnosis present

## 2021-10-03 DIAGNOSIS — A419 Sepsis, unspecified organism: Secondary | ICD-10-CM | POA: Diagnosis not present

## 2021-10-03 DIAGNOSIS — U071 COVID-19: Secondary | ICD-10-CM | POA: Diagnosis present

## 2021-10-03 DIAGNOSIS — A4189 Other specified sepsis: Principal | ICD-10-CM | POA: Diagnosis present

## 2021-10-03 DIAGNOSIS — I480 Paroxysmal atrial fibrillation: Secondary | ICD-10-CM | POA: Diagnosis not present

## 2021-10-03 DIAGNOSIS — R0902 Hypoxemia: Secondary | ICD-10-CM | POA: Diagnosis not present

## 2021-10-03 DIAGNOSIS — Z7189 Other specified counseling: Secondary | ICD-10-CM | POA: Diagnosis not present

## 2021-10-03 DIAGNOSIS — N400 Enlarged prostate without lower urinary tract symptoms: Secondary | ICD-10-CM | POA: Diagnosis present

## 2021-10-03 DIAGNOSIS — Z515 Encounter for palliative care: Secondary | ICD-10-CM | POA: Diagnosis not present

## 2021-10-03 DIAGNOSIS — I4891 Unspecified atrial fibrillation: Secondary | ICD-10-CM

## 2021-10-03 DIAGNOSIS — J9601 Acute respiratory failure with hypoxia: Secondary | ICD-10-CM | POA: Diagnosis present

## 2021-10-03 DIAGNOSIS — Z96643 Presence of artificial hip joint, bilateral: Secondary | ICD-10-CM | POA: Diagnosis present

## 2021-10-03 DIAGNOSIS — J690 Pneumonitis due to inhalation of food and vomit: Secondary | ICD-10-CM | POA: Diagnosis present

## 2021-10-03 DIAGNOSIS — E860 Dehydration: Secondary | ICD-10-CM | POA: Diagnosis present

## 2021-10-03 DIAGNOSIS — J189 Pneumonia, unspecified organism: Secondary | ICD-10-CM | POA: Diagnosis present

## 2021-10-03 DIAGNOSIS — E785 Hyperlipidemia, unspecified: Secondary | ICD-10-CM | POA: Diagnosis present

## 2021-10-03 DIAGNOSIS — E1165 Type 2 diabetes mellitus with hyperglycemia: Secondary | ICD-10-CM | POA: Diagnosis present

## 2021-10-03 DIAGNOSIS — N1832 Chronic kidney disease, stage 3b: Secondary | ICD-10-CM | POA: Diagnosis present

## 2021-10-03 DIAGNOSIS — I959 Hypotension, unspecified: Secondary | ICD-10-CM | POA: Diagnosis not present

## 2021-10-03 DIAGNOSIS — F05 Delirium due to known physiological condition: Secondary | ICD-10-CM | POA: Diagnosis not present

## 2021-10-03 DIAGNOSIS — I4819 Other persistent atrial fibrillation: Secondary | ICD-10-CM | POA: Diagnosis present

## 2021-10-03 DIAGNOSIS — R1312 Dysphagia, oropharyngeal phase: Secondary | ICD-10-CM | POA: Diagnosis not present

## 2021-10-03 DIAGNOSIS — K219 Gastro-esophageal reflux disease without esophagitis: Secondary | ICD-10-CM | POA: Diagnosis present

## 2021-10-03 DIAGNOSIS — R0603 Acute respiratory distress: Secondary | ICD-10-CM | POA: Diagnosis present

## 2021-10-03 DIAGNOSIS — Z8249 Family history of ischemic heart disease and other diseases of the circulatory system: Secondary | ICD-10-CM

## 2021-10-03 DIAGNOSIS — E878 Other disorders of electrolyte and fluid balance, not elsewhere classified: Secondary | ICD-10-CM | POA: Diagnosis present

## 2021-10-03 DIAGNOSIS — Y95 Nosocomial condition: Secondary | ICD-10-CM | POA: Diagnosis present

## 2021-10-03 DIAGNOSIS — R739 Hyperglycemia, unspecified: Secondary | ICD-10-CM | POA: Diagnosis present

## 2021-10-03 DIAGNOSIS — Z87891 Personal history of nicotine dependence: Secondary | ICD-10-CM

## 2021-10-03 DIAGNOSIS — I2510 Atherosclerotic heart disease of native coronary artery without angina pectoris: Secondary | ICD-10-CM | POA: Diagnosis present

## 2021-10-03 DIAGNOSIS — R0689 Other abnormalities of breathing: Secondary | ICD-10-CM | POA: Diagnosis not present

## 2021-10-03 DIAGNOSIS — I130 Hypertensive heart and chronic kidney disease with heart failure and stage 1 through stage 4 chronic kidney disease, or unspecified chronic kidney disease: Secondary | ICD-10-CM | POA: Diagnosis present

## 2021-10-03 DIAGNOSIS — E11649 Type 2 diabetes mellitus with hypoglycemia without coma: Secondary | ICD-10-CM | POA: Diagnosis not present

## 2021-10-03 DIAGNOSIS — N179 Acute kidney failure, unspecified: Secondary | ICD-10-CM | POA: Diagnosis present

## 2021-10-03 DIAGNOSIS — E43 Unspecified severe protein-calorie malnutrition: Secondary | ICD-10-CM | POA: Diagnosis present

## 2021-10-03 DIAGNOSIS — R0602 Shortness of breath: Secondary | ICD-10-CM | POA: Diagnosis not present

## 2021-10-03 DIAGNOSIS — R918 Other nonspecific abnormal finding of lung field: Secondary | ICD-10-CM | POA: Diagnosis not present

## 2021-10-03 DIAGNOSIS — I5022 Chronic systolic (congestive) heart failure: Secondary | ICD-10-CM | POA: Diagnosis present

## 2021-10-03 LAB — URINALYSIS, ROUTINE W REFLEX MICROSCOPIC
Bilirubin Urine: NEGATIVE
Glucose, UA: NEGATIVE mg/dL
Hgb urine dipstick: NEGATIVE
Ketones, ur: NEGATIVE mg/dL
Leukocytes,Ua: NEGATIVE
Nitrite: NEGATIVE
Protein, ur: NEGATIVE mg/dL
Specific Gravity, Urine: 1.014 (ref 1.005–1.030)
pH: 5 (ref 5.0–8.0)

## 2021-10-03 LAB — COMPREHENSIVE METABOLIC PANEL
ALT: 23 U/L (ref 0–44)
AST: 40 U/L (ref 15–41)
Albumin: 2.6 g/dL — ABNORMAL LOW (ref 3.5–5.0)
Alkaline Phosphatase: 67 U/L (ref 38–126)
Anion gap: 15 (ref 5–15)
BUN: 100 mg/dL — ABNORMAL HIGH (ref 8–23)
CO2: 33 mmol/L — ABNORMAL HIGH (ref 22–32)
Calcium: 8.3 mg/dL — ABNORMAL LOW (ref 8.9–10.3)
Chloride: 100 mmol/L (ref 98–111)
Creatinine, Ser: 2.29 mg/dL — ABNORMAL HIGH (ref 0.61–1.24)
GFR, Estimated: 26 mL/min — ABNORMAL LOW (ref >60)
Glucose, Bld: 337 mg/dL — ABNORMAL HIGH (ref 70–99)
Potassium: 4.3 mmol/L (ref 3.5–5.1)
Sodium: 148 mmol/L — ABNORMAL HIGH (ref 135–145)
Total Bilirubin: 1.3 mg/dL — ABNORMAL HIGH (ref 0.3–1.2)
Total Protein: 7 g/dL (ref 6.5–8.1)

## 2021-10-03 LAB — CBC WITH DIFFERENTIAL/PLATELET
Abs Immature Granulocytes: 0 10*3/uL (ref 0.00–0.07)
Band Neutrophils: 50 %
Basophils Absolute: 0 10*3/uL (ref 0.0–0.1)
Basophils Relative: 0 %
Eosinophils Absolute: 0 10*3/uL (ref 0.0–0.5)
Eosinophils Relative: 0 %
HCT: 44.6 % (ref 39.0–52.0)
Hemoglobin: 14.4 g/dL (ref 13.0–17.0)
Lymphocytes Relative: 10 %
Lymphs Abs: 0.9 10*3/uL (ref 0.7–4.0)
MCH: 31 pg (ref 26.0–34.0)
MCHC: 32.3 g/dL (ref 30.0–36.0)
MCV: 96.1 fL (ref 80.0–100.0)
Monocytes Absolute: 0.3 10*3/uL (ref 0.1–1.0)
Monocytes Relative: 3 %
Neutro Abs: 7.7 10*3/uL (ref 1.7–7.7)
Neutrophils Relative %: 37 %
Platelets: 195 10*3/uL (ref 150–400)
RBC: 4.64 MIL/uL (ref 4.22–5.81)
RDW: 14.6 % (ref 11.5–15.5)
WBC Morphology: INCREASED
WBC: 8.9 10*3/uL (ref 4.0–10.5)
nRBC: 0 % (ref 0.0–0.2)

## 2021-10-03 LAB — BRAIN NATRIURETIC PEPTIDE: B Natriuretic Peptide: 455 pg/mL — ABNORMAL HIGH (ref 0.0–100.0)

## 2021-10-03 LAB — BLOOD GAS, ARTERIAL
Acid-Base Excess: 14.2 mmol/L — ABNORMAL HIGH (ref 0.0–2.0)
Bicarbonate: 39.2 mmol/L — ABNORMAL HIGH (ref 20.0–28.0)
Drawn by: 41977
O2 Saturation: 89.5 %
Patient temperature: 36.9
pCO2 arterial: 48 mmHg (ref 32–48)
pH, Arterial: 7.52 — ABNORMAL HIGH (ref 7.35–7.45)
pO2, Arterial: 58 mmHg — ABNORMAL LOW (ref 83–108)

## 2021-10-03 LAB — LACTIC ACID, PLASMA
Lactic Acid, Venous: 3 mmol/L (ref 0.5–1.9)
Lactic Acid, Venous: 2.8 mmol/L (ref 0.5–1.9)
Lactic Acid, Venous: 2.1 mmol/L (ref 0.5–1.9)

## 2021-10-03 LAB — LIPASE, BLOOD: Lipase: 33 U/L (ref 11–51)

## 2021-10-03 LAB — RESP PANEL BY RT-PCR (FLU A&B, COVID) ARPGX2
Influenza A by PCR: NEGATIVE
Influenza B by PCR: NEGATIVE
SARS Coronavirus 2 by RT PCR: POSITIVE — AB

## 2021-10-03 LAB — PROCALCITONIN: Procalcitonin: 2.76 ng/mL

## 2021-10-03 LAB — APTT: aPTT: 55 seconds — ABNORMAL HIGH (ref 24–36)

## 2021-10-03 LAB — PROTIME-INR
INR: 3 — ABNORMAL HIGH (ref 0.8–1.2)
Prothrombin Time: 30.6 seconds — ABNORMAL HIGH (ref 11.4–15.2)

## 2021-10-03 LAB — GLUCOSE, CAPILLARY: Glucose-Capillary: 203 mg/dL — ABNORMAL HIGH (ref 70–99)

## 2021-10-03 MED ORDER — SODIUM CHLORIDE 0.9 % IV SOLN: 200.0000 mg | Freq: Once | INTRAVENOUS | Status: DC

## 2021-10-03 MED ORDER — FLUOROMETHOLONE 0.1 % OP SUSP
1.0000 [drp] | Freq: Two times a day (BID) | OPHTHALMIC | Status: DC
  Administered 2021-10-03 – 2021-10-11 (×16): 1 [drp] via OPHTHALMIC
  Filled 2021-10-03: qty 5

## 2021-10-03 MED ORDER — SODIUM CHLORIDE 0.9% FLUSH: 3.0000 mL | INTRAVENOUS | Status: DC | PRN

## 2021-10-03 MED ORDER — SODIUM CHLORIDE 0.9 % IV SOLN: 1.0000 g | Freq: Once | INTRAVENOUS | Status: DC

## 2021-10-03 MED ORDER — ONDANSETRON HCL 4 MG/2ML IJ SOLN: 4.0000 mg | Freq: Four times a day (QID) | INTRAMUSCULAR | Status: DC | PRN

## 2021-10-03 MED ORDER — ALBUTEROL SULFATE HFA 108 (90 BASE) MCG/ACT IN AERS: 2.0000 | INHALATION_SPRAY | Freq: Four times a day (QID) | RESPIRATORY_TRACT | Status: DC

## 2021-10-03 MED ORDER — CHLORHEXIDINE GLUCONATE CLOTH 2 % EX PADS
6.0000 | MEDICATED_PAD | Freq: Every day | CUTANEOUS | Status: DC
  Administered 2021-10-04 – 2021-10-11 (×8): 6 via TOPICAL

## 2021-10-03 MED ORDER — SODIUM CHLORIDE 0.9 % IV SOLN
100.0000 mg | Freq: Every day | INTRAVENOUS | Status: AC
  Administered 2021-10-04 – 2021-10-05 (×2): 100 mg via INTRAVENOUS
  Filled 2021-10-03 (×2): qty 20

## 2021-10-03 MED ORDER — ACETAMINOPHEN 325 MG PO TABS: 650.0000 mg | ORAL_TABLET | Freq: Four times a day (QID) | ORAL | Status: DC | PRN

## 2021-10-03 MED ORDER — INSULIN DETEMIR 100 UNIT/ML ~~LOC~~ SOLN
0.0750 [IU]/kg | Freq: Two times a day (BID) | SUBCUTANEOUS | Status: DC
  Administered 2021-10-03 – 2021-10-04 (×2): 6 [IU] via SUBCUTANEOUS
  Filled 2021-10-03 (×5): qty 0.06

## 2021-10-03 MED ORDER — FUROSEMIDE 10 MG/ML IJ SOLN
80.0000 mg | Freq: Once | INTRAMUSCULAR | Status: AC
  Administered 2021-10-03: 80 mg via INTRAVENOUS
  Filled 2021-10-03: qty 8

## 2021-10-03 MED ORDER — VANCOMYCIN HCL 1500 MG/300ML IV SOLN
1500.0000 mg | Freq: Once | INTRAVENOUS | Status: AC
  Administered 2021-10-03: 1500 mg via INTRAVENOUS
  Filled 2021-10-03: qty 300

## 2021-10-03 MED ORDER — PREDNISONE 20 MG PO TABS
50.0000 mg | ORAL_TABLET | Freq: Every day | ORAL | Status: DC
  Administered 2021-10-06 – 2021-10-08 (×3): 50 mg via ORAL
  Filled 2021-10-03 (×3): qty 1

## 2021-10-03 MED ORDER — SODIUM CHLORIDE 0.9 % IV SOLN: 2.0000 g | INTRAVENOUS | Status: DC

## 2021-10-03 MED ORDER — FLUOROMETHOLONE ACETATE 0.1 % OP SUSP: 1.0000 [drp] | Freq: Two times a day (BID) | OPHTHALMIC | Status: DC

## 2021-10-03 MED ORDER — ACETAMINOPHEN 650 MG RE SUPP
650.0000 mg | Freq: Once | RECTAL | Status: AC
  Administered 2021-10-03: 650 mg via RECTAL
  Filled 2021-10-03: qty 1

## 2021-10-03 MED ORDER — SODIUM CHLORIDE 0.9 % IV SOLN: INTRAVENOUS | Status: DC

## 2021-10-03 MED ORDER — METRONIDAZOLE 500 MG/100ML IV SOLN
500.0000 mg | Freq: Once | INTRAVENOUS | Status: AC
  Administered 2021-10-03: 500 mg via INTRAVENOUS
  Filled 2021-10-03: qty 100

## 2021-10-03 MED ORDER — SODIUM CHLORIDE 0.9 % IV SOLN: 500.0000 mg | Freq: Once | INTRAVENOUS | Status: DC

## 2021-10-03 MED ORDER — SODIUM CHLORIDE 0.9 % IV SOLN: 250.0000 mL | INTRAVENOUS | Status: DC | PRN

## 2021-10-03 MED ORDER — INSULIN ASPART 100 UNIT/ML IJ SOLN
0.0000 [IU] | INTRAMUSCULAR | Status: DC
  Administered 2021-10-03: 3 [IU] via SUBCUTANEOUS
  Administered 2021-10-05: 1 [IU] via SUBCUTANEOUS
  Administered 2021-10-06: 3 [IU] via SUBCUTANEOUS
  Administered 2021-10-06: 5 [IU] via SUBCUTANEOUS
  Administered 2021-10-06: 7 [IU] via SUBCUTANEOUS
  Administered 2021-10-06: 5 [IU] via SUBCUTANEOUS
  Administered 2021-10-06: 7 [IU] via SUBCUTANEOUS
  Administered 2021-10-06 – 2021-10-07 (×4): 3 [IU] via SUBCUTANEOUS
  Administered 2021-10-07: 5 [IU] via SUBCUTANEOUS
  Administered 2021-10-07 (×3): 3 [IU] via SUBCUTANEOUS
  Administered 2021-10-08 (×2): 1 [IU] via SUBCUTANEOUS
  Administered 2021-10-08: 2 [IU] via SUBCUTANEOUS
  Administered 2021-10-08: 1 [IU] via SUBCUTANEOUS
  Administered 2021-10-08: 2 [IU] via SUBCUTANEOUS
  Administered 2021-10-09: 1 [IU] via SUBCUTANEOUS
  Administered 2021-10-09: 3 [IU] via SUBCUTANEOUS
  Administered 2021-10-09: 1 [IU] via SUBCUTANEOUS
  Administered 2021-10-09: 3 [IU] via SUBCUTANEOUS
  Administered 2021-10-09 – 2021-10-10 (×4): 2 [IU] via SUBCUTANEOUS
  Administered 2021-10-10: 1 [IU] via SUBCUTANEOUS

## 2021-10-03 MED ORDER — SODIUM CHLORIDE 0.9 % IV SOLN
INTRAVENOUS | Status: AC
  Filled 2021-10-03: qty 8

## 2021-10-03 MED ORDER — LACTATED RINGERS IV BOLUS (SEPSIS)
1000.0000 mL | Freq: Once | INTRAVENOUS | Status: AC
  Administered 2021-10-03: 1000 mL via INTRAVENOUS

## 2021-10-03 MED ORDER — VANCOMYCIN HCL 1250 MG/250ML IV SOLN: 1250.0000 mg | INTRAVENOUS | Status: DC

## 2021-10-03 MED ORDER — ZINC GLUCONATE 50 MG PO TABS
50.0000 mg | ORAL_TABLET | Freq: Every day | ORAL | Status: DC
  Filled 2021-10-03 (×3): qty 1

## 2021-10-03 MED ORDER — SODIUM CHLORIDE 0.9 % IV SOLN
2.0000 g | Freq: Once | INTRAVENOUS | Status: AC
  Administered 2021-10-03: 2 g via INTRAVENOUS
  Filled 2021-10-03: qty 12.5

## 2021-10-03 MED ORDER — SODIUM CHLORIDE 0.9 % IV SOLN: 100.0000 mg | Freq: Every day | INTRAVENOUS | Status: DC

## 2021-10-03 MED ORDER — METHYLPREDNISOLONE SODIUM SUCC 40 MG IJ SOLR
0.5000 mg/kg | Freq: Two times a day (BID) | INTRAMUSCULAR | Status: AC
  Administered 2021-10-03 – 2021-10-06 (×6): 37.2 mg via INTRAVENOUS
  Filled 2021-10-03 (×6): qty 1

## 2021-10-03 MED ORDER — SODIUM CHLORIDE 0.9 % IV SOLN
100.0000 mg | INTRAVENOUS | Status: AC
  Administered 2021-10-03: 100 mg via INTRAVENOUS
  Filled 2021-10-03: qty 20

## 2021-10-03 MED ORDER — ONDANSETRON HCL 4 MG PO TABS: 4.0000 mg | ORAL_TABLET | Freq: Four times a day (QID) | ORAL | Status: DC | PRN

## 2021-10-03 MED ORDER — GUAIFENESIN 100 MG/5ML PO LIQD
10.0000 mL | Freq: Three times a day (TID) | ORAL | Status: DC
  Administered 2021-10-03 – 2021-10-11 (×22): 10 mL
  Filled 2021-10-03 (×22): qty 10

## 2021-10-03 MED ORDER — SODIUM CHLORIDE 0.9 % IV SOLN
3.0000 g | Freq: Two times a day (BID) | INTRAVENOUS | Status: DC
  Administered 2021-10-04 – 2021-10-09 (×11): 3 g via INTRAVENOUS
  Filled 2021-10-03: qty 8
  Filled 2021-10-03: qty 3
  Filled 2021-10-03 (×9): qty 8
  Filled 2021-10-03: qty 3
  Filled 2021-10-03: qty 8

## 2021-10-03 MED ORDER — VANCOMYCIN HCL IN DEXTROSE 1-5 GM/200ML-% IV SOLN: 1000.0000 mg | Freq: Once | INTRAVENOUS | Status: DC

## 2021-10-03 MED ORDER — LACTATED RINGERS IV BOLUS (SEPSIS): 250.0000 mL | Freq: Once | INTRAVENOUS | Status: DC

## 2021-10-03 MED ORDER — VITAMIN C 500 MG PO TABS
500.0000 mg | ORAL_TABLET | Freq: Two times a day (BID) | ORAL | Status: DC
  Administered 2021-10-03 – 2021-10-11 (×16): 500 mg
  Filled 2021-10-03 (×16): qty 1

## 2021-10-03 MED ORDER — LACTATED RINGERS IV SOLN: INTRAVENOUS | Status: AC

## 2021-10-03 MED ORDER — APIXABAN 5 MG PO TABS
5.0000 mg | ORAL_TABLET | Freq: Two times a day (BID) | ORAL | Status: DC
  Administered 2021-10-03 – 2021-10-04 (×2): 5 mg
  Filled 2021-10-03 (×2): qty 1

## 2021-10-03 MED ORDER — SODIUM CHLORIDE 0.9% FLUSH
3.0000 mL | Freq: Two times a day (BID) | INTRAVENOUS | Status: DC
  Administered 2021-10-03 – 2021-10-11 (×16): 3 mL via INTRAVENOUS

## 2021-10-03 NOTE — Assessment & Plan Note (Addendum)
-  Last known LVEF of 45 to 50% from a 2D echocardiogram which was done 08/27/21 -Stable/compensated currently -Continue to follow daily weights and strict I's and O's -Continue judicious fluid resuscitation in the setting of acute sepsis.

## 2021-10-03 NOTE — Assessment & Plan Note (Signed)
Continue Eliquis as primary prophylaxis for an acute stroke

## 2021-10-03 NOTE — Assessment & Plan Note (Addendum)
-  As evidenced by fever with a Tmax of 102.8, tachycardia, tachypnea, hypotension that responded to IV fluid resuscitation as well as lactic acidosis. -Source of sepsis appears to be secondary to aspiration pneumonia with associated acute respiratory failure with hypoxia requiring the need of BiPAP at time of admission.. -no BIPAP for almost over 72 hours. -continue to demonstrate intermittent tachypnea -Patient with COVID infection. -Continue empiric antibiotic therapy with Unasyn for aspiration PNA. -Follow-up results of blood cultures -Currently with good saturation on 1 L [place] to room air; still with intermittent episodes of tachypnea but otherwise improved and adequately progressing/stabilizing. -Continue bronchodilator management and supportive care. -currently has remained afebrile and his WBC's WNL.

## 2021-10-03 NOTE — Plan of Care (Signed)
  Problem: Clinical Measurements: Goal: Diagnostic test results will improve Outcome: Progressing   Problem: Respiratory: Goal: Will maintain a patent airway Outcome: Progressing   

## 2021-10-03 NOTE — Assessment & Plan Note (Addendum)
O2 sats are only 90% on supplemental oxygen.  He has coarse rhonchi which obscure the heart sounds.  Initial chest x-ray did not reveal COVID-pneumonia.  He remains at high risk of aspiration due to dysphagia which has required the placement of the PEG tube. Suction as necessary, DuoNeb 4 times daily x5 days, and steroids via PEG tube initiated.

## 2021-10-03 NOTE — Progress Notes (Signed)
Pharmacy Antibiotic Note  Dominic Hodges is a 88 y.o. male admitted on 10/03/2021 with  unknown source of infection .  Pharmacy has been consulted for vancomycin and cefepime dosing.  Plan: Vancomycin 1500 mg iV x 1 Vancomycin 1250 mg IV every 48 hours. Cefepime 2000 mg IV every 24 hours. Monitor labs, c/s, and vanco level as indicated.  Height: '6\' 5"'$  (195.6 cm) Weight: 74.4 kg (164 lb) IBW/kg (Calculated) : 89.1  Temp (24hrs), Avg:102.8 F (39.3 C), Min:102.8 F (39.3 C), Max:102.8 F (39.3 C)  Recent Labs  Lab 10/01/21 1240 10/03/21 1432 10/03/21 1516  WBC 7.3 8.9  --   CREATININE 1.55* 2.29*  --   LATICACIDVEN  --   --  3.0*    Estimated Creatinine Clearance: 21.7 mL/min (A) (by C-G formula based on SCr of 2.29 mg/dL (H)).    Allergies  Allergen Reactions   Robaxin [Methocarbamol] Other (See Comments)    Mental confusion    Antimicrobials this admission: Vanco 9/26 >> Cefepime 9/26 >> Flagyl 9/26  Microbiology results: 9/26 BCx: pending 9/26 UCx: pending    Thank you for allowing pharmacy to be a part of this patient's care.  Margot Ables, PharmD Clinical Pharmacist 10/03/2021 4:46 PM

## 2021-10-03 NOTE — Patient Instructions (Signed)
See assessment and plan under each diagnosis in the problem list and acutely for this visit 

## 2021-10-03 NOTE — Progress Notes (Signed)
Elink following code sepsis °

## 2021-10-03 NOTE — Progress Notes (Signed)
Pharmacy Antibiotic Note  Dominic Hodges is a 88 y.o. male admitted on 10/03/2021 with  unknown source of infection suspicious of aspiration pneumonia . Pharmacy has been consulted for Unasyn dosing. Patient received x1 dose of vancomycin, cefepime, and Flagyl in ED.   Plan: Unasyn 3g Q12h Trend WBC, fever, renal function F/u cultures, clinical progress, levels as indicated F/u MRSA nares  De-escalate when able   Height: '6\' 5"'$  (195.6 cm) Weight: 74.4 kg (164 lb) IBW/kg (Calculated) : 89.1  Temp (24hrs), Avg:102.8 F (39.3 C), Min:102.8 F (39.3 C), Max:102.8 F (39.3 C)  Recent Labs  Lab 10/01/21 1240 10/03/21 1432 10/03/21 1516  WBC 7.3 8.9  --   CREATININE 1.55* 2.29*  --   LATICACIDVEN  --   --  3.0*    Estimated Creatinine Clearance: 21.7 mL/min (A) (by C-G formula based on SCr of 2.29 mg/dL (H)).    Allergies  Allergen Reactions   Robaxin [Methocarbamol] Other (See Comments)    Mental confusion    Antimicrobials this admission: Vanco 9/26 x1 Cefepime 9/26 x1 Flagyl 9/26 x1  Microbiology results: 9/26 BCx: pending 9/26 UCx: pending    Thank you for allowing pharmacy to be a part of this patient's care.  Ardyth Harps, PharmD Clinical Pharmacist

## 2021-10-03 NOTE — Assessment & Plan Note (Addendum)
-  Secondary to known history of oropharyngeal dysphagia status post PEG tube insertion -continue Strict aspiration precaution -Transition antibiotics to Augmentin suspension on 10/09/2021; so far well tolerated (planning a total of 10 days of antibiotics). -continue adjusted rate tube feedings velocity and follow response.

## 2021-10-03 NOTE — Progress Notes (Signed)
Notified bedside nurse of need to administer antibiotics and fluid bolus.  

## 2021-10-03 NOTE — Assessment & Plan Note (Addendum)
-  Patient tested positive for COVID-19 virus on 10/01/21 -Was started on molnupiravir as an outpatient for 3 days prior to admission. -Patient has completed treatment with remdesivir; continue steroids tapering and supportive care. -Continue to wean off oxygen supplementation as tolerated -Continue contact precaution -Continue bronchodilator management and the use of  antitussive/mucolytic regimen as required. -continue to Follow inflammatory markers (demonstarting adequate response and overall improvement). -continue chest physiotherapy to further assist with secretion management.

## 2021-10-03 NOTE — Assessment & Plan Note (Addendum)
-  Patient presented to the ER in respiratory distress and was noted to to be tachypneic with increased work of breathing and use of accessory muscles. -at time of admission 86% oxygen saturation on room air; patient required to be placed on BiPAP -Continue current supportive care, bronchodilator management and IV antibiotic. -continue to wean off oxygen as tolerated; currently off BIPAP for almost 24 hours -still with intermittent tachypnea. -follow clinical response and continue CPT

## 2021-10-03 NOTE — Progress Notes (Signed)
Lab called critical value of Lactic Acid 2.1 at 2329 This value is improving over previous. Provider made aware via secure chat at 2330

## 2021-10-03 NOTE — Assessment & Plan Note (Addendum)
-  Patient with known history of oropharyngeal dysphagia admitted to the hospital with aspiration pneumonia. -continue Strict aspiration precautions -continue current tube feedings rate -continue prosource -follow response

## 2021-10-03 NOTE — ED Triage Notes (Signed)
Pt dx with Covid on Sunday at [facility]. Worsening sob today. Pt placed on Bipap. Family at bedside.

## 2021-10-03 NOTE — Progress Notes (Signed)
Notified bedside nurse of need to draw repeat lactic acid. 

## 2021-10-03 NOTE — ED Provider Notes (Signed)
[facility] EMERGENCY DEPARTMENT Provider Note   CSN: [id number] Arrival date & time: 10/03/21  1420     History  Chief Complaint  Patient presents with   Shortness of Breath    Dominic Hodges is a 88 y.o. male.  Patient brought over by EMS from [facility] in respiratory distress.  According to patient's family they stated that on Sunday he was diagnosed with COVID but lab review does not show a positive test.  Patient's wife was diagnosed with COVID the day before and is currently upstairs with respiratory difficulties but showing signs of improvement.  They tested him at the [facility] because his wife was positive and he was positive as well.  Started to run into some respiratory difficulties yesterday and got very severe today patient arrived here in significant respiratory distress this temp was 102.8 oxygen sats were 86% on high flow oxygen.  Blood pressure 94/55 respirations 50 heart rate 109.  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  Was admitted back in August for congestive heart failure problems.  Has been at the [facility] now for a while.  Patient is a full code.  Past medical history significant for hypertension hyperlipidemia CHF and persistent atrial fibrillation.  Past surgical history significant for appendectomy PEG placement in April 2022 hip arthroplasty in 2016 on the right.  Patient used to smoke a pipe quit in [place] Medications Prior to Admission medications   Medication Sig Start Date End Date Taking? Authorizing Provider  acetaminophen (TYLENOL) 325 MG tablet Take 650 mg by mouth every 6 (six) hours as needed.    [provider]  apixaban (ELIQUIS) 5 MG TABS tablet Place 1 tablet (5 mg total) into feeding tube 2 (two) times daily. 10/02/21   Medina-Vargas, Monina C, NP  ascorbic acid (VITAMIN C) 500 MG tablet Place 1 tablet (500 mg total) into feeding tube 2 (two) times daily for 13 days. 10/02/21  10/15/21  Medina-Vargas, Monina C, NP  ergocalciferol (VITAMIN D2) 1.25 MG (50000 UT) capsule Place 1 capsule (50,000 Units total) into feeding tube once a week for 2 doses. 10/02/21 10/10/21  Medina-Vargas, Monina C, NP  fluorometholone (EFLONE) 0.1 % ophthalmic suspension Place 1 drop into both eyes 2 (two) times daily. 10/02/21   Medina-Vargas, Monina C, NP  fluorometholone (FML) 0.1 % ophthalmic suspension SMARTSIG:In Eye(s) 10/02/21   [provider]  guaiFENesin (ROBITUSSIN) 100 MG/5ML liquid Place 10 mLs into feeding tube in the morning, at noon, and at bedtime for 11 days. 10/02/21 10/13/21  Medina-Vargas, Monina C, NP  molnupiravir EUA (LAGEVRIO) 200 mg CAPS capsule Give 4 capsules = 800 mg Via peg tube twice a day X 5 days 10/02/21   Medina-Vargas, Monina C, NP  Nutritional Supplements (FEEDING SUPPLEMENT, OSMOLITE 1.5 CAL,) LIQD Place 55 mLs into feeding tube continuous. Every shift; Day, Evening, and Night.    [provider]  torsemide (DEMADEX) 20 MG tablet Place 1 tablet (20 mg total) into feeding tube daily. 10/02/21   Medina-Vargas, Monina C, NP  zinc gluconate 50 MG tablet Place 1 tablet (50 mg total) into feeding tube daily. 10/02/21   Medina-Vargas, Monina C, NP      Allergies    Robaxin [methocarbamol]    Review of Systems   Review of Systems  Unable to perform ROS: Severe respiratory distress    Physical Exam Updated Vital Signs BP 102/65  Pulse 97   Temp (!) 102.8 F (39.3 C) (Rectal)   Resp (!) 47   Ht 1.956 m ('6\' 5"'$ )   Wt 74.4 kg   SpO2 90%   BMI 19.45 kg/m  Physical Exam Vitals and nursing note reviewed.  Constitutional:      General: He is in acute distress.     Appearance: He is well-developed. He is ill-appearing and toxic-appearing. He is not diaphoretic.  HENT:     Head: Normocephalic and atraumatic.  Eyes:     Conjunctiva/sclera: Conjunctivae normal.     Pupils: Pupils are equal, round, and reactive to light.  Cardiovascular:     Rate  and Rhythm: Regular rhythm. Tachycardia present.     Heart sounds: No murmur heard. Pulmonary:     Effort: Pulmonary effort is normal. No respiratory distress.     Breath sounds: Decreased breath sounds and rhonchi present. No wheezing or rales.  Chest:     Chest wall: No tenderness.  Abdominal:     Palpations: Abdomen is soft.     Tenderness: There is no abdominal tenderness.  Musculoskeletal:        General: No swelling.     Cervical back: Normal range of motion and neck supple.     Right lower leg: Edema present.     Left lower leg: Edema present.  Skin:    General: Skin is warm and dry.     Capillary Refill: Capillary refill takes less than 2 seconds.  Neurological:     Mental Status: He is alert.     Comments: Patient alert.  According to family somewhat baseline but difficult to assess completely because he is in such severe respiratory distress.  Psychiatric:        Mood and Affect: Mood normal.     ED Results / Procedures / Treatments   Labs (all labs ordered are listed, but only abnormal results are displayed) Labs Reviewed  BRAIN NATRIURETIC PEPTIDE - Abnormal; Notable for the following components:      Result Value   B Natriuretic Peptide 455.0 (*)    All other components within normal limits  COMPREHENSIVE METABOLIC PANEL - Abnormal; Notable for the following components:   Sodium 148 (*)    CO2 33 (*)    Glucose, Bld 337 (*)    BUN 100 (*)    Creatinine, Ser 2.29 (*)    Calcium 8.3 (*)    Albumin 2.6 (*)    Total Bilirubin 1.3 (*)    GFR, Estimated 26 (*)    All other components within normal limits  URINALYSIS, ROUTINE W REFLEX MICROSCOPIC - Abnormal; Notable for the following components:   APPearance HAZY (*)    All other components within normal limits  LACTIC ACID, PLASMA - Abnormal; Notable for the following components:   Lactic Acid, Venous 3.0 (*)    All other components within normal limits  BLOOD GAS, ARTERIAL - Abnormal; Notable for the  following components:   pH, Arterial 7.52 (*)    pO2, Arterial 58 (*)    Bicarbonate 39.2 (*)    Acid-Base Excess 14.2 (*)    All other components within normal limits  CULTURE, BLOOD (ROUTINE X 2)  CULTURE, BLOOD (ROUTINE X 2)  RESP PANEL BY RT-PCR (FLU A&B, COVID) ARPGX2  URINE CULTURE  CBC WITH DIFFERENTIAL/PLATELET  LIPASE, BLOOD  LACTIC ACID, PLASMA  PROTIME-INR  APTT  CBG MONITORING, ED    EKG EKG Interpretation  Date/Time:  Tuesday October 03 2021 15:43:54 EDT Ventricular Rate:  122 PR Interval:    QRS Duration: 110 QT Interval:  329 QTC Calculation: 469 R Axis:   -69 Text Interpretation: Atrial fibrillation Left anterior fascicular block Abnormal R-wave progression, late transition Confirmed by Fredia Sorrow [phone]) on 10/03/2021 3:48:27 PM  Radiology DG Chest Port 1 View  Result Date: 10/03/2021 CLINICAL DATA:  Shortness of breath, COVID EXAM: PORTABLE CHEST 1 VIEW COMPARISON:  08/29/2021, 10/27/2020 FINDINGS: Heterogeneous airspace opacity at the left greater than right lung base. The heart is slightly enlarged. Aortic atherosclerosis. No pleural effusion or pneumothorax. IMPRESSION: Similar heterogeneous airspace opacities in the left greater than right lung base suggestive of pneumonia. Electronically Signed   By: Donavan Foil M.D.   On: 10/03/2021 15:21    Procedures Procedures    Medications Ordered in ED Medications  0.9 %  sodium chloride infusion ( Intravenous New Bag/Given 10/03/21 1541)  acetaminophen (TYLENOL) tablet 650 mg (has no administration in time range)  lactated ringers infusion (has no administration in time range)  ceFEPIme (MAXIPIME) 2 g in sodium chloride 0.9 % 100 mL IVPB (has no administration in time range)  metroNIDAZOLE (FLAGYL) IVPB 500 mg (has no administration in time range)  lactated ringers bolus 1,000 mL (has no administration in time range)    And  lactated ringers bolus 1,000 mL (has no administration in time range)     And  lactated ringers bolus 250 mL (has no administration in time range)  vancomycin (VANCOREADY) IVPB 1500 mg/300 mL (has no administration in time range)  ceFEPIme (MAXIPIME) 2 g in sodium chloride 0.9 % 100 mL IVPB (has no administration in time range)  vancomycin (VANCOREADY) IVPB 1250 mg/250 mL (has no administration in time range)  furosemide (LASIX) injection 80 mg (80 mg Intravenous Given 10/03/21 1506)  acetaminophen (TYLENOL) suppository 650 mg (650 mg Rectal Given 10/03/21 1507)    ED Course/ Medical Decision Making/ A&P                           Medical Decision Making Amount and/or Complexity of Data Reviewed Labs: ordered. Radiology: ordered. ECG/medicine tests: ordered.  Risk OTC drugs. Prescription drug management. Decision regarding hospitalization.   CRITICAL CARE Performed by: Fredia Sorrow Total critical care time: 60 minutes Critical care time was exclusive of separately billable procedures and treating other patients. Critical care was necessary to treat or prevent imminent or life-threatening deterioration. Critical care was time spent personally by me on the following activities: development of treatment plan with patient and/or surrogate as well as nursing, discussions with consultants, evaluation of patient's response to treatment, examination of patient, obtaining history from patient or surrogate, ordering and performing treatments and interventions, ordering and review of laboratory studies, ordering and review of radiographic studies, pulse oximetry and re-evaluation of patient's condition.   Patient arrived in significant respiratory distress.  Immediately started on BiPAP.  Vital signs suggestive of sepsis.  Although not truly hypotensive blood pressure was in the 90s.  On the BiPAP patient did improve some but still was struggling with respirations.  Arterial blood gas showed a pH of 7.52 with a PO2 of 58 PCO2 of 48 bicarb 39.2.  So clearly he needed  the BiPAP.  Chest x-ray I expected to find significant airspace opacities but he had similar changes from back into August.  No significant change but showed airspace opacities in the left greater than right base suggestive of pneumonia.  But  unchanged.  Patient clearly had a positive COVID diagnosis we did not have it in our system so we will repeat that for confirmation.  Patient's lungs not suggestive of multifocal pneumonia.  But still could have developing pneumonia could be COVID-pneumonia developing as well.  Patient was started on sepsis protocol although his white blood cell count was not elevated his lactic acid was elevated at 3.  And CBC showed a white count of 8.9 hemoglobin was good at 03.2 complete metabolic panel showed sodium to be up some at 148 glucose 337 CO2 33 potassium was normal at 4.3 his creatinine was 2.29 for a GFR 26 and that normally his GFR's are in the 40s.  Lipase was normal liver function tests were normal other than a bilirubin of 1.3.  Urinalysis hazy but negative for urinary tract infection.  BNP was elevated at 455 because we had some concerns about CHF.  The chest x-ray did not show any significant evidence of that.  And when he was admitted with a CSF exacerbation his BNP was 800 something.  Hospitalist will admit patient with the broad-spectrum antibiotics and initial fluids blood pressure was up to 122 systolic.  He was showing some improvement on the BiPAP.  He got Tylenol for the fever.  Family was updated and informed on what we were finding.  At this time no indication for intubation presentation could be due to [place] could be due to's sepsis could be due to developing pneumonia.  Patient also had acute kidney injury so I think you will benefit from the 30 cc/kg fluid.  Hopefully will not get fluid overloaded.   Final Clinical Impression(s) / ED Diagnoses Final diagnoses:  COVID  Acute respiratory distress  Acute kidney injury ([place])  Sepsis, due to  unspecified organism, unspecified whether acute organ dysfunction present [facility])    Rx / DC Orders ED Discharge Orders     None         Fredia Sorrow, MD 10/03/21 1706

## 2021-10-03 NOTE — Assessment & Plan Note (Addendum)
-  Patient has a baseline serum creatinine of 1.9 and creatinine peaked at 2.29 at time of admission. -Creatinine trending down.  Current creatinine level 2.20 -Worsening renal function appears to be secondary to prerenal azotemia, hypotension and continue use of nephrotoxic agents -Continue to avoid/minimize nephrotoxic agents -Maintain adequate hydration and the use of free water through PEG tube. -Follow renal function trend.

## 2021-10-03 NOTE — Assessment & Plan Note (Addendum)
Suction as necessary.  DuoNeb 4 times daily x5 days.  As he remains a full code; transport to ED if there is progressive respiratory compromise. Hospice placement was to be pursued once he was discharged from the SNF to his home.

## 2021-10-03 NOTE — Assessment & Plan Note (Signed)
Unclear etiology. No history of diabetes mellitus and unclear if patient was started on systemic steroids at the skilled nursing facility. Blood sugar was over 300 upon arrival to the hospital and is likely to get worse since he is currently on systemic steroids. Start patient on long-acting insulin Sliding scale for blood sugar check every 4 hours

## 2021-10-03 NOTE — H&P (Signed)
History and Physical    Patient: Dominic Hodges UXN:[id number] DOB: 1927-11-07 DOA: 10/03/2021 DOS: the patient was seen and examined on 10/03/2021 PCP: Celene Squibb, MD  Patient coming from: SNF  Chief Complaint:  Chief Complaint  Patient presents with   Shortness of Breath   HPI: Dominic Hodges is a 88 y.o. male with medical history significant for persistent atrial fibrillation, chronic systolic CHF, hypertension, history of oropharyngeal dysphagia status post PEG tube placement who was brought into the ER by EMS from [facility] where he resides in respiratory distress. Patient tested positive for COVID-19 viral infection on 10/01/21.  His wife is currently hospitalized for COVID-19 infection and so patient was tested and his results came back positive.  He was brought into the ER for worsening respiratory distress and upon arrival was noted to have a fever with a Tmax of 102.8, pulse oximetry on high flow oxygen of 86%, respiratory rate of 15, heart rate 109 and blood pressure 94/55. He was immediately placed on BiPAP in the ER with some improvement in his work of breathing.  Pulse oximetry on BiPAP is anywhere from 88 to 90%. ABG 7.52/48/58/39.2/89.5. Labs show lactic acid level of 3.0 Chest x-ray reviewed by me shows Similar heterogeneous airspace opacities in the left greater than right lung base suggestive of pneumonia. CT scan of the chest without contrast showed new areas of dense basilar consolidation in the lower lobes,endobronchial material and patchy areas of ground-glass and discrete nodularity discussed above, compatible with aspiration related pneumonia. 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 Mild cardiomegaly, aortic atherosclerosis and coronary artery disease. He received sepsis IV fluids in the ER as well as empiric antibiotic therapy with Flagyl, vancomycin and cefepime. I am unable to do a review of systems on this patient due to his  underlying dysarthria and respiratory failure requiring BiPAP   Review of Systems: unable to review all systems due to the inability of the patient to answer questions. Past Medical History:  Diagnosis Date   Acid reflux    mild-states if doesnt chew food completely he begins to cough- states has weakness in muscles due to ilieus- has been doing exreci this   Adynamic ileus ([place]) 12/2014   ? anesthesia related per pt   BPH (benign prostatic hyperplasia)    Complication of anesthesia    10/11/15- per pt "had Keddie- in 11/16-related it to possibly anesthesia"   H/O seasonal allergies    Heart failure with mid-range ejection fraction (HFmEF) (HCC)    Hyperlipidemia    Hypertension    Hyponatremia    Persistent atrial fibrillation [facility])    Past Surgical History:  Procedure Laterality Date   APPENDECTOMY     approx 30 years ago/lwb   CATARACT EXTRACTION W/PHACO Left 09/11/2012   Procedure: CATARACT EXTRACTION PHACO AND INTRAOCULAR LENS PLACEMENT ([place]);  Surgeon: Tonny Branch, MD;  Location: [facility];  Service: Ophthalmology;  Laterality: Left;  CDE: 19.52   ESOPHAGOGASTRODUODENOSCOPY (EGD) WITH PROPOFOL N/A 05/03/2020   Procedure: ESOPHAGOGASTRODUODENOSCOPY (EGD) WITH PROPOFOL;  Surgeon: Aviva Signs, MD;  Location: [facility];  Service: General;  Laterality: N/A;   HIP ARTHROPLASTY Right 12/06/2014   Procedure: PARTIAL RIGHT HIP REPLACEMENT;  Surgeon: Carole Civil, MD;  Location: [facility];  Service: Orthopedics;  Laterality: Right;   HIP ARTHROPLASTY Left 03/08/2020   Procedure: ARTHROPLASTY BIPOLAR HIP (HEMIARTHROPLASTY);  Surgeon: Mordecai Rasmussen, MD;  Location: [facility];  Service: Orthopedics;  Laterality: Left;   IR REPLC GASTRO/COLONIC TUBE PERCUT W/FLUORO  09/20/2021   LUMBAR LAMINECTOMY/DECOMPRESSION MICRODISCECTOMY N/A 10/19/2015   Procedure: CENTRAL DECOMPRESSION LUMBAR LAMINECTOMY FOR SPINAL STENOSIS, L3,L4FORAMINOTOMY BILATERAL L3,L4 NERVE ROOT;  Surgeon:  Latanya Maudlin, MD;  Location: [facility];  Service: Orthopedics;  Laterality: N/A;   PEG PLACEMENT N/A 05/03/2020   Procedure: PERCUTANEOUS ENDOSCOPIC GASTROSTOMY (PEG) PLACEMENT;  Surgeon: Aviva Signs, MD;  Location: [facility];  Service: General;  Laterality: N/A;   UMBILICAL HERNIA REPAIR     Approx 10 years ago/lwb   ureteral stone extraction     Social History:  reports that he quit smoking about 62 years ago. His smoking use included pipe. He has never used smokeless tobacco. He reports current alcohol use. He reports that he does not use drugs.  Allergies  Allergen Reactions   Robaxin [Methocarbamol] Other (See Comments)    Mental confusion    Family History  Problem Relation Age of Onset   Coronary artery disease Other        no family history of premature CAD    Prior to Admission medications   Medication Sig Start Date End Date Taking? Authorizing Provider  acetaminophen (TYLENOL) 325 MG tablet Take 650 mg by mouth every 6 (six) hours as needed.    [provider]  apixaban (ELIQUIS) 5 MG TABS tablet Place 1 tablet (5 mg total) into feeding tube 2 (two) times daily. 10/02/21   Medina-Vargas, Monina C, NP  ascorbic acid (VITAMIN C) 500 MG tablet Place 1 tablet (500 mg total) into feeding tube 2 (two) times daily for 13 days. 10/02/21 10/15/21  Medina-Vargas, Monina C, NP  ergocalciferol (VITAMIN D2) 1.25 MG (50000 UT) capsule Place 1 capsule (50,000 Units total) into feeding tube once a week for 2 doses. 10/02/21 10/10/21  Medina-Vargas, Monina C, NP  fluorometholone (EFLONE) 0.1 % ophthalmic suspension Place 1 drop into both eyes 2 (two) times daily. 10/02/21   Medina-Vargas, Monina C, NP  fluorometholone (FML) 0.1 % ophthalmic suspension SMARTSIG:In Eye(s) 10/02/21   [provider]  guaiFENesin (ROBITUSSIN) 100 MG/5ML liquid Place 10 mLs into feeding tube in the morning, at noon, and at bedtime for 11 days. 10/02/21 10/13/21  Medina-Vargas, Monina C, NP  molnupiravir EUA  (LAGEVRIO) 200 mg CAPS capsule Give 4 capsules = 800 mg Via peg tube twice a day X 5 days 10/02/21   Medina-Vargas, Monina C, NP  Nutritional Supplements (FEEDING SUPPLEMENT, OSMOLITE 1.5 CAL,) LIQD Place 55 mLs into feeding tube continuous. Every shift; Day, Evening, and Night.    [provider]  torsemide (DEMADEX) 20 MG tablet Place 1 tablet (20 mg total) into feeding tube daily. 10/02/21   Medina-Vargas, Monina C, NP  zinc gluconate 50 MG tablet Place 1 tablet (50 mg total) into feeding tube daily. 10/02/21   Medina-Vargas, Jaymes Graff C, NP    Physical Exam: Vitals:   10/03/21 1449 10/03/21 1500 10/03/21 1530 10/03/21 1600  BP: (!) '94/55 91/65 98/68 '$ 102/65  Pulse: (!) 109 95 (!) 103 97  Resp: (!) 50 (!) 38 (!) 55 (!) 47  Temp: (!) 102.8 F (39.3 C)     TempSrc: Rectal     SpO2: (!) 86% 92% 96% 90%  Weight:      Height:       Physical Exam Vitals and nursing note reviewed.  Constitutional:      Comments: Thin and frail elderly male lying in bed on BiPAP  HENT:  Head: Normocephalic and atraumatic.  Eyes:     Comments: Pale conjunctiva  Cardiovascular:     Rate and Rhythm: Tachycardia present.  Pulmonary:     Effort: Tachypnea present.     Breath sounds: Examination of the right-upper field reveals rhonchi. Examination of the left-upper field reveals rhonchi. Examination of the right-middle field reveals rhonchi. Examination of the left-middle field reveals rhonchi. Examination of the right-lower field reveals rhonchi. Examination of the left-lower field reveals rhonchi. Rhonchi present.  Abdominal:     General: Bowel sounds are normal.     Palpations: Abdomen is soft.     Comments: PEG tube in place  Musculoskeletal:        General: Normal range of motion.     Cervical back: Normal range of motion and neck supple.  Skin:    General: Skin is warm and dry.  Neurological:     Comments: Unable to assess  Psychiatric:     Comments: Unable to assess     Data  Reviewed: Relevant notes from primary care and specialist visits, past discharge summaries as available in EHR, including Care Everywhere. Prior diagnostic testing as pertinent to current admission diagnoses Updated medications and problem lists for reconciliation ED course, including vitals, labs, imaging, treatment and response to treatment Triage notes, nursing and pharmacy notes and ED provider's notes Notable results as noted in HPI Labs reviewed.  Lactic acid 3.0, sodium 148, potassium 4.3, chloride 100, bicarb 33, glucose 237, BUN 100, creatinine 2.29 above a baseline of 1.5, calcium 8.3, total protein 7.0, albumin 2.6, AST 40, ALT 23, alkaline phosphatase 67, total bilirubin 1.3, white count 8.9, hemoglobin 14.4, hematocrit 44.6, platelet count 195 Chest x-ray reviewed by me shows Similar heterogeneous airspace opacities in the left greater than right lung base suggestive of pneumonia. Twelve-lead EKG reviewed by me shows A-fib with a rapid ventricular rate There are no new results to review at this time.  Assessment and Plan: * Sepsis ([place]) As evidenced by fever with a Tmax of 102.8, tachycardia, tachypnea, hypotension that responded to IV fluid resuscitation as well as lactic acidosis. Source of sepsis appears to be secondary to aspiration pneumonia with associated acute respiratory failure as patient is currently on BiPAP to reduce work of breathing and improve oxygenation. Continue empiric antibiotic therapy with Unasyn Follow-up results of blood cultures   Aspiration pneumonia ([place]) Secondary to known history of oropharyngeal dysphagia status post PEG tube insertion Hold tube feeds for now Strict aspiration precaution Place patient on IV Unasyn  Acute respiratory failure with hypoxia [facility]) Patient presented to the ER in respiratory distress and was noted to to be tachypneic with increased work of breathing and use of accessory muscles. On high flow nasal cannula his pulse  oximetry was 86% and patient is currently on BiPAP to reduce work of breathing and improve oxygenation. We will attempt to wean off BiPAP once acute illness improves  Dysphagia, oropharyngeal Patient with known history of oropharyngeal dysphagia admitted to the hospital with aspiration pneumonia. Hold tube feeds for now Strict aspiration precautions  COVID-19 virus infection Patient tested positive for COVID-19 virus on 10/01/21 Was started on molnupiravir as an outpatient We will place patient on remdesivir as well as systemic steroids due to acute respiratory failure requiring BiPAP Supportive care with antitussives, bronchodilator therapy and vitamins  Hyperglycemia Unclear etiology. No history of diabetes mellitus and unclear if patient was started on systemic steroids at the skilled nursing facility. Blood sugar was over 300 upon arrival  to the hospital and is likely to get worse since he is currently on systemic steroids. Start patient on long-acting insulin Sliding scale for blood sugar check every 4 hours  Chronic systolic CHF (congestive heart failure) (HCC) Last known LVEF of 45 to 50% from a 2D echocardiogram which was done 08/27/21 Not acutely exacerbated Hold torsemide for now due to AKI and hypotension  AKI (acute kidney injury) [facility]) Patient has a baseline serum creatinine of 1.9 and today on admission it is 2.29 Worsening renal function appears to be secondary to hypotension from sepsis Hold torsemide Judicious IV fluid resuscitation Renal parameters in a.m.  Atrial fibrillation ([place]) Continue Eliquis as primary prophylaxis for an acute stroke      Advance Care Planning:   Code Status: DNR   Consults: Palliative care  Family Communication: Greater than 50% of time was spent discussing patient's condition and plan of care with his daughter [name] over the phone.  All questions and concerns have been addressed.  She verbalizes understanding and agrees  with the plan.  CODE STATUS was discussed and she wants him to be a DO NOT RESUSCITATE.  Severity of Illness: The appropriate patient status for this patient is INPATIENT. Inpatient status is judged to be reasonable and necessary in order to provide the required intensity of service to ensure the patient's safety. The patient's presenting symptoms, physical exam findings, and initial radiographic and laboratory data in the context of their chronic comorbidities is felt to place them at high risk for further clinical deterioration. Furthermore, it is not anticipated that the patient will be medically stable for discharge from the hospital within 2 midnights of admission.   * I certify that at the point of admission it is my clinical judgment that the patient will require inpatient hospital care spanning beyond 2 midnights from the point of admission due to high intensity of service, high risk for further deterioration and high frequency of surveillance required.*  Author: Collier Bullock, MD 10/03/2021 5:42 PM  For on call review [email or web address].

## 2021-10-03 NOTE — Progress Notes (Signed)
Patient transported from ED to CT and back without any complications. 

## 2021-10-04 ENCOUNTER — Encounter (HOSPITAL_COMMUNITY): Payer: Self-pay | Admitting: Internal Medicine

## 2021-10-04 DIAGNOSIS — Z515 Encounter for palliative care: Secondary | ICD-10-CM

## 2021-10-04 DIAGNOSIS — U071 COVID-19: Secondary | ICD-10-CM

## 2021-10-04 DIAGNOSIS — Z7189 Other specified counseling: Secondary | ICD-10-CM

## 2021-10-04 DIAGNOSIS — N179 Acute kidney failure, unspecified: Secondary | ICD-10-CM | POA: Diagnosis not present

## 2021-10-04 DIAGNOSIS — J9601 Acute respiratory failure with hypoxia: Secondary | ICD-10-CM | POA: Diagnosis not present

## 2021-10-04 DIAGNOSIS — R739 Hyperglycemia, unspecified: Secondary | ICD-10-CM

## 2021-10-04 DIAGNOSIS — I5022 Chronic systolic (congestive) heart failure: Secondary | ICD-10-CM

## 2021-10-04 DIAGNOSIS — J690 Pneumonitis due to inhalation of food and vomit: Secondary | ICD-10-CM

## 2021-10-04 DIAGNOSIS — I480 Paroxysmal atrial fibrillation: Secondary | ICD-10-CM

## 2021-10-04 DIAGNOSIS — A419 Sepsis, unspecified organism: Secondary | ICD-10-CM | POA: Diagnosis not present

## 2021-10-04 LAB — CBC WITH DIFFERENTIAL/PLATELET
Abs Immature Granulocytes: 1.9 10*3/uL — ABNORMAL HIGH (ref 0.00–0.07)
Band Neutrophils: 26 %
Basophils Absolute: 0 10*3/uL (ref 0.0–0.1)
Basophils Relative: 0 %
Eosinophils Absolute: 0 10*3/uL (ref 0.0–0.5)
Eosinophils Relative: 0 %
HCT: 43 % (ref 39.0–52.0)
Hemoglobin: 13.7 g/dL (ref 13.0–17.0)
Lymphocytes Relative: 9 %
Lymphs Abs: 1 10*3/uL (ref 0.7–4.0)
MCH: 30.9 pg (ref 26.0–34.0)
MCHC: 31.9 g/dL (ref 30.0–36.0)
MCV: 97.1 fL (ref 80.0–100.0)
Metamyelocytes Relative: 15 %
Monocytes Absolute: 0.4 10*3/uL (ref 0.1–1.0)
Monocytes Relative: 4 %
Myelocytes: 2 %
Neutro Abs: 7.8 10*3/uL — ABNORMAL HIGH (ref 1.7–7.7)
Neutrophils Relative %: 44 %
Platelets: 180 10*3/uL (ref 150–400)
RBC: 4.43 MIL/uL (ref 4.22–5.81)
RDW: 14.7 % (ref 11.5–15.5)
WBC: 11.2 10*3/uL — ABNORMAL HIGH (ref 4.0–10.5)
nRBC: 0 % (ref 0.0–0.2)

## 2021-10-04 LAB — COMPREHENSIVE METABOLIC PANEL
ALT: 20 U/L (ref 0–44)
AST: 35 U/L (ref 15–41)
Albumin: 2.4 g/dL — ABNORMAL LOW (ref 3.5–5.0)
Alkaline Phosphatase: 60 U/L (ref 38–126)
Anion gap: 11 (ref 5–15)
BUN: 104 mg/dL — ABNORMAL HIGH (ref 8–23)
CO2: 31 mmol/L (ref 22–32)
Calcium: 8.5 mg/dL — ABNORMAL LOW (ref 8.9–10.3)
Chloride: 109 mmol/L (ref 98–111)
Creatinine, Ser: 1.94 mg/dL — ABNORMAL HIGH (ref 0.61–1.24)
GFR, Estimated: 32 mL/min — ABNORMAL LOW (ref >60)
Glucose, Bld: 99 mg/dL (ref 70–99)
Potassium: 3.5 mmol/L (ref 3.5–5.1)
Sodium: 151 mmol/L — ABNORMAL HIGH (ref 135–145)
Total Bilirubin: 1.3 mg/dL — ABNORMAL HIGH (ref 0.3–1.2)
Total Protein: 6.3 g/dL — ABNORMAL LOW (ref 6.5–8.1)

## 2021-10-04 LAB — GLUCOSE, CAPILLARY
Glucose-Capillary: 109 mg/dL — ABNORMAL HIGH (ref 70–99)
Glucose-Capillary: 127 mg/dL — ABNORMAL HIGH (ref 70–99)
Glucose-Capillary: 187 mg/dL — ABNORMAL HIGH (ref 70–99)
Glucose-Capillary: 37 mg/dL — CL (ref 70–99)
Glucose-Capillary: 73 mg/dL (ref 70–99)
Glucose-Capillary: 87 mg/dL (ref 70–99)
Glucose-Capillary: 88 mg/dL (ref 70–99)

## 2021-10-04 LAB — URINE CULTURE: Culture: NO GROWTH

## 2021-10-04 LAB — MAGNESIUM: Magnesium: 2.6 mg/dL — ABNORMAL HIGH (ref 1.7–2.4)

## 2021-10-04 LAB — MRSA NEXT GEN BY PCR, NASAL: MRSA by PCR Next Gen: NOT DETECTED

## 2021-10-04 LAB — PROCALCITONIN: Procalcitonin: 3.13 ng/mL

## 2021-10-04 MED ORDER — APIXABAN 2.5 MG PO TABS
2.5000 mg | ORAL_TABLET | Freq: Two times a day (BID) | ORAL | Status: DC
  Administered 2021-10-04 – 2021-10-09 (×10): 2.5 mg
  Filled 2021-10-04 (×10): qty 1

## 2021-10-04 MED ORDER — ZINC SULFATE 220 (50 ZN) MG PO CAPS
220.0000 mg | ORAL_CAPSULE | ORAL | Status: DC
  Administered 2021-10-04 – 2021-10-11 (×4): 220 mg
  Filled 2021-10-04 (×5): qty 1

## 2021-10-04 MED ORDER — ALBUTEROL SULFATE (2.5 MG/3ML) 0.083% IN NEBU: 2.5000 mg | INHALATION_SOLUTION | Freq: Four times a day (QID) | RESPIRATORY_TRACT | Status: DC | PRN

## 2021-10-04 MED ORDER — DEXTROSE 50 % IV SOLN
INTRAVENOUS | Status: AC
  Administered 2021-10-04: 50 mL
  Filled 2021-10-04: qty 50

## 2021-10-04 NOTE — Plan of Care (Signed)
  Problem: Respiratory: Goal: Will maintain a patent airway Outcome: Progressing   

## 2021-10-04 NOTE — Progress Notes (Signed)
Progress Note   Patient: Dominic Hodges HKV:[id number] DOB: 1927/11/10 DOA: 10/03/2021     1 DOS: the patient was seen and examined on 10/04/2021   Brief hospital course: As per H&P written by Dr. Francine Graven on 10/03/2021 [name] is a 88 y.o. male with medical history significant for persistent atrial fibrillation, chronic systolic CHF, hypertension, history of oropharyngeal dysphagia status post PEG tube placement who was brought into the ER by EMS from [facility] where he resides in respiratory distress. Patient tested positive for COVID-19 viral infection on 10/01/21.  His wife is currently hospitalized for COVID-19 infection and so patient was tested and his results came back positive.  He was brought into the ER for worsening respiratory distress and upon arrival was noted to have a fever with a Tmax of 102.8, pulse oximetry on high flow oxygen of 86%, respiratory rate of 15, heart rate 109 and blood pressure 94/55. He was immediately placed on BiPAP in the ER with some improvement in his work of breathing.  Pulse oximetry on BiPAP is anywhere from 88 to 90%. ABG 7.52/48/58/39.2/89.5. Labs show lactic acid level of 3.0 Chest x-ray reviewed by me shows Similar heterogeneous airspace opacities in the left greater than right lung base suggestive of pneumonia. CT scan of the chest without contrast showed new areas of dense basilar consolidation in the lower lobes,endobronchial material and patchy areas of ground-glass and discrete nodularity discussed above, compatible with aspiration related pneumonia. 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 Mild cardiomegaly, aortic atherosclerosis and coronary artery disease. He received sepsis IV fluids in the ER as well as empiric antibiotic therapy with Flagyl, vancomycin and cefepime. I am unable to do a review of systems on this patient due to his underlying dysarthria and respiratory failure requiring BiPAP  Assessment  and Plan: * Sepsis ([place]) -As evidenced by fever with a Tmax of 102.8, tachycardia, tachypnea, hypotension that responded to IV fluid resuscitation as well as lactic acidosis. -Source of sepsis appears to be secondary to aspiration pneumonia with associated acute respiratory failure with hypoxia requiring the need of BiPAP. -Patient with recent COVID infection. -Continue empiric antibiotic therapy with Unasyn -Follow-up results of blood cultures -Continue to wean oxygen supplementation as tolerated -Continue bronchodilator management and supportive care.   Aspiration pneumonia ([place]) -Secondary to known history of oropharyngeal dysphagia status post PEG tube insertion -Continue holding tube feeds for now -Strict aspiration precaution -Continue current antibiotics.  Acute respiratory failure with hypoxia [facility]) -Patient presented to the ER in respiratory distress and was noted to to be tachypneic with increased work of breathing and use of accessory muscles. -86% oxygen saturation on room air; patient has required to be placed on BiPAP -Continue current supportive care, bronchodilator management and IV antibiotic -Wean off BiPAP as tolerated.  Dysphagia, oropharyngeal -Patient with known history of oropharyngeal dysphagia admitted to the hospital with aspiration pneumonia. -Continue to hold tube feeds for now -Strict aspiration precautions  COVID-19 virus infection -Patient tested positive for COVID-19 virus on 10/01/21 -Was started on molnupiravir as an outpatient -Continue treatment with remdesivir and steroid -Wean off oxygen supplementation as tolerated -Continue contact precaution -Continue bronchodilator management and antitussive/mucolytic regimen as required. -Follow inflammatory markers.  Hyperglycemia - Hyperglycemia in the setting of his steroids and tube feedings -Prior history of diabetes -Continue sliding scale insulin and follow CBGs fluctuation.  Chronic systolic  CHF (congestive heart failure) (HCC) -Last known LVEF of 45 to 50% from a  2D echocardiogram which was done 08/27/21 -Stable/compensated currently -Continue to follow daily weights and strict I's and O's -Continue judicious fluid resuscitation in the setting of acute sepsis.  AKI (acute kidney injury) ([place]) -Patient has a baseline serum creatinine of 1.9 and creatinine peaked at 2.29 at time of admission -Worsening renal function appears to be secondary to prerenal azotemia, hypotension and continue use of nephrotoxic agents -Avoid/minimize nephrotoxic agents -Maintain adequate hydration -Follow renal function trend.   Atrial fibrillation (HCC) -Continue Eliquis as primary prophylaxis for an acute stroke. -Rate controlled currently.  Severe protein calorie malnutrition -Chronically receiving ration through PEG tube -Currently holding tube feedings in the setting of worsening breathing and BiPAP requirement. -Follow stabilization to resume nutrition.  Subjective:  Short winded and hypoxic requiring BiPAP.  Tachypnea appreciated on exam.  Currently afebrile.  No nausea, no vomiting, no chest pain.  Physical Exam: Vitals:   10/04/21 1416 10/04/21 1500 10/04/21 1547 10/04/21 1600  BP: 114/81 (!) 108/57  (!) 129/97  Pulse: 83 84 86 63  Resp: (!) 28 (!) 26 (!) 22 (!) 40  Temp:   97.7 F (36.5 C)   TempSrc:   Axillary   SpO2: 93% 90% 91% 96%  Weight:      Height:       General exam: Alert, awake, able to follow commands appropriately.  Very short of breath with minimal activity and at time of evaluation requiring the need of BiPAP to maintain saturation.  Patient is afebrile. Respiratory system: Positive rhonchi bilaterally; no wheezing, tachypnea appreciated on exam.  No crackles.  BiPAP in place. Cardiovascular system:RRR. No rubs or gallops; no JVD. Gastrointestinal system: Abdomen is nondistended, soft and nontender.  PEG tube in place. Central nervous system: Alert and  oriented. No focal neurological deficits. Extremities: No cyanosis or clubbing.  No edema. Skin: No petechiae. Psychiatry: Flat affect appreciated on exam.  Data Reviewed: Procalcitonin level 3.13 CBC: WBCs 11.2, hemoglobin 13.7 and platelet count 180 K Complete metabolic panel: Sodium 694, potassium 3.5, chloride 109, bicarb 31, BUN 104, creatinine 1.94, overall normal LFTs.  Anion gap 11. Magnesium: 2.6  Family Communication: No family at bedside.  Disposition: Status is: Inpatient Remains inpatient appropriate because: Still requiring treatment for acute respiratory failure with hypoxia in the setting of healthcare associated pneumonia/aspiration and COVID infection.   Planned Discharge Destination: Skilled nursing facility  Author: Barton Dubois, MD 10/04/2021 5:59 PM  For on call review [email or web address].

## 2021-10-04 NOTE — Progress Notes (Signed)
Recheck of cbg shows 187

## 2021-10-04 NOTE — Consult Note (Signed)
Consultation Note Date: 10/04/2021   Patient Name: Dominic Hodges  DOB: 08-Mar-1927  MRN: [id number]  Age / Sex: 88 y.o., male  PCP: Celene Squibb, MD Referring Physician: Barton Dubois, MD  Reason for Consultation: Establishing goals of care  HPI/Patient Profile: 88 y.o. male  with past medical history of persistent A-fib, chronic systolic heart failure, HTN/HLD, history of oropharyngeal dysphagia with PEG tube placement April 2022, BPH, right hip replacement 2016, left hip replacement 2022, lumbar laminectomy/decompression 2017, at [facility] since August hospitalization admitted on 10/03/2021 with sepsis, aspiration pneumonia, dysphagia, COVID.   Clinical Assessment and Goals of Care: I have reviewed medical records including EPIC notes, labs and imaging, received report from RN, assessed the patient.  Dominic Hodges is lying quietly in his room with BiPAP in place.  He appears acutely/chronically ill and quite frail.  I do not believe that he can make his basic needs known.  There is no family at bedside at this time due to COVID-positive status.  Unfortunately, [name] wife is hospitalized at this time with COVID, his daughter/healthcare surrogate, Dominic Hodges, is also COVID-positive.  She calls in frequently for updates.  Call to daughter/healthcare surrogate, Dominic Hodges to discuss diagnosis prognosis, GOC, EOL wishes, disposition and options.  I introduced Palliative Medicine as specialized medical care for people living with serious illness. It focuses on providing relief from the symptoms and stress of a serious illness. The goal is to improve quality of life for both the patient and the family.  Brief life review of the patient.  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  His wife is still living, but is suffering with memory loss.  He has 3  adult children, [name], and [name].  Dominic Hodges has been at [facility] since his hospital stay in August.  We then focused on their current illness.  We talk about respiratory status, COVID, the treatment plan.  We talk about time for outcomes.  The natural disease trajectory and expectations at EOL were discussed.  Advanced directives, concepts specific to code status, artifical feeding and hydration, and rehospitalization were considered and discussed. Dominic Hodges states she has discussed code status with her sister and brother, Dominic Hodges and Dominic Hodges. They are in agreement for DNR.   From Palliative family meeting note 08/29/2021  " I asked Dominic Hodges if he were to be unresponsive, and his children got different information, could then make a different choice.  He states affirmatively that his children can make a different choice if they have new information.  We also talk about preferred place of death.  Dominic Hodges states that he has not considered these choices."  Hospice Care services outpatient were discussed.  Dominic Hodges states that she has considered taking Dominic Hodges to his own home with hospice care.  We talk about the constraints that hospice may have due to COVID-positive status.  Discussed the importance of continued conversation with family and the medical providers regarding  overall plan of care and treatment options, ensuring decisions are within the context of the patient's values and GOCs.  Questions and concerns were addressed.   The family was encouraged to call with questions or concerns.  PMT will continue to support holistically.  Conference with attending, bedside nursing staff, transition of care team related to patient condition, needs, goals of care, disposition.    HCPOA HCPOA -daughter, Dominic Hodges, is named as healthcare surrogate.    SUMMARY OF RECOMMENDATIONS   At this point continue to treat the treatable but no CPR or intubation Time for  outcomes    Code Status/Advance Care Planning: DNR - Dominic Hodges states she has discussed code status with her sister and brother, Dominic Hodges and Dominic Hodges. They are in agreement for DNR.   From Palliative family meeting note 08/29/2021  " I asked Dominic Hodges if he were to be unresponsive, and his children got different information, could then make a different choice.  He states affirmatively that his children can make a different choice if they have new information.  We also talk about preferred place of death.  Dominic Hodges states that he has not considered these choices."  Symptom Management:  Per hospitalist, no additional needs at this time.  Palliative Prophylaxis:  Frequent Pain Assessment, Oral Care, and Turn Reposition  Additional Recommendations (Limitations, Scope, Preferences): Continue to treat the treatable but no CPR or intubation  Psycho-social/Spiritual:  Desire for further Chaplaincy support:no Additional Recommendations: Caregiving  Support/Resources and Education on Hospice  Prognosis:  Unable to determine, guarded at this point.  Discharge Planning: To be determined, based on outcomes      Primary Diagnoses: Present on Admission:  Sepsis ([place])  AKI (acute kidney injury) ([place])  Dysphagia, oropharyngeal  Acute respiratory failure with hypoxia (HCC)  Chronic systolic CHF (congestive heart failure) ([place])  Aspiration pneumonia ([place])  Hyperglycemia   I have reviewed the medical record, interviewed the patient and family, and examined the patient. The following aspects are pertinent.  Past Medical History:  Diagnosis Date   Acid reflux    mild-states if doesnt chew food completely he begins to cough- states has weakness in muscles due to ilieus- has been doing exreci this   Adynamic ileus ([place]) 12/2014   ? anesthesia related per pt   BPH (benign prostatic hyperplasia)    Complication of anesthesia    10/11/15- per pt "had [facility]- in  11/16-related it to possibly anesthesia"   H/O seasonal allergies    Heart failure with mid-range ejection fraction (HFmEF) (HCC)    Hyperlipidemia    Hypertension    Hyponatremia    Persistent atrial fibrillation ([place])    Social History   Socioeconomic History   Marital status: Married    Spouse name: Not on file   Number of children: Not on file   Years of education: Not on file   Highest education Hodges: Not on file  Occupational History   Not on file  Tobacco Use   Smoking status: Former    Types: Pipe    Quit date: 01/09/1959    Years since quitting: 62.7   Smokeless tobacco: Never   Tobacco comments:    smoked a pipe not sure how long  Vaping Use   Vaping Use: Never used  Substance and Sexual Activity   Alcohol use: Yes    Comment: occasional social drink/lwb   Drug use: No   Sexual activity: Not on file  Other Topics Concern  Not on file  Social History Narrative   Not on file   Social Determinants of Health   Financial Resource Strain: Not on file  Food Insecurity: No Food Insecurity (10/03/2021)   Hunger Vital Sign    Worried About Running Out of Food in the Last Year: Never true    Ran Out of Food in the Last Year: Never true  Transportation Needs: No Transportation Needs (10/03/2021)   PRAPARE - Hydrologist (Medical): No    Lack of Transportation (Non-Medical): No  Physical Activity: Not on file  Stress: Not on file  Social Connections: Not on file   Family History  Problem Relation Age of Onset   Coronary artery disease Other        no family history of premature CAD   Scheduled Meds:  apixaban  2.5 mg Per Tube BID   ascorbic acid  500 mg Per Tube BID   Chlorhexidine Gluconate Cloth  6 each Topical Q0600   fluorometholone  1 drop Both Eyes BID   guaiFENesin  10 mL Per Tube TID   insulin aspart  0-9 Units Subcutaneous Q4H   insulin detemir  0.075 Units/kg Subcutaneous BID   methylPREDNISolone (SOLU-MEDROL)  injection  0.5 mg/kg Intravenous Q12H   Followed by   Derrill Memo ON 10/06/2021] predniSONE  50 mg Oral Daily   sodium chloride flush  3 mL Intravenous Q12H   zinc sulfate  220 mg Per Tube Q M,W,F   Continuous Infusions:  sodium chloride     ampicillin-sulbactam (UNASYN) IV Stopped (10/04/21 9242)   lactated ringers     remdesivir 100 mg in sodium chloride 0.9 % 100 mL IVPB 100 mg (10/04/21 1240)   PRN Meds:.sodium chloride, acetaminophen, ondansetron **OR** ondansetron (ZOFRAN) IV, sodium chloride flush Medications Prior to Admission:  Prior to Admission medications   Medication Sig Start Date End Date Taking? Authorizing Provider  acetaminophen (TYLENOL) 325 MG tablet Take 650 mg by mouth every 6 (six) hours as needed.   Yes [provider]  apixaban (ELIQUIS) 5 MG TABS tablet Place 1 tablet (5 mg total) into feeding tube 2 (two) times daily. 10/02/21  Yes Medina-Vargas, Monina C, NP  ascorbic acid (VITAMIN C) 500 MG tablet Place 1 tablet (500 mg total) into feeding tube 2 (two) times daily for 13 days. 10/02/21 10/15/21 Yes Medina-Vargas, Monina C, NP  fluorometholone (EFLONE) 0.1 % ophthalmic suspension Place 1 drop into both eyes 2 (two) times daily. 10/02/21  Yes Medina-Vargas, Monina C, NP  fluorometholone (FML) 0.1 % ophthalmic suspension Place 1 drop into both eyes 2 (two) times daily. 10/02/21  Yes [provider]  guaiFENesin (ROBITUSSIN) 100 MG/5ML liquid Place 10 mLs into feeding tube in the morning, at noon, and at bedtime for 11 days. 10/02/21 10/13/21 Yes Medina-Vargas, Monina C, NP  molnupiravir EUA (LAGEVRIO) 200 mg CAPS capsule Give 4 capsules = 800 mg Via peg tube twice a day X 5 days 10/02/21  Yes Medina-Vargas, Monina C, NP  Nutritional Supplements (FEEDING SUPPLEMENT, OSMOLITE 1.5 CAL,) LIQD Place 55 mLs into feeding tube continuous. Every shift; Day, Evening, and Night.   Yes [provider]  potassium chloride SA (KLOR-CON M) 20 MEQ tablet Take 20 mEq by  mouth daily. By g-tube for 5 days Start date 10/02/21   Yes [provider]  predniSONE (DELTASONE) 20 MG tablet Take 20 mg by mouth 2 (two) times daily with a meal. G-tube for 5 days start on 10/02/21  Yes [provider]  torsemide (DEMADEX) 20 MG tablet Place 1 tablet (20 mg total) into feeding tube daily. 10/02/21  Yes Medina-Vargas, Monina C, NP  zinc gluconate 50 MG tablet Place 1 tablet (50 mg total) into feeding tube daily. 10/02/21  Yes Medina-Vargas, Monina C, NP  ergocalciferol (VITAMIN D2) 1.25 MG (50000 UT) capsule Place 1 capsule (50,000 Units total) into feeding tube once a week for 2 doses. 10/02/21 10/10/21  Medina-Vargas, Senaida Lange, NP   Allergies  Allergen Reactions   Robaxin [Methocarbamol] Other (See Comments)    Mental confusion   Review of Systems  Unable to perform ROS: Severe respiratory distress    Physical Exam Vitals and nursing note reviewed.     Vital Signs: BP 110/61   Pulse 87   Temp (!) 97.2 F (36.2 C)   Resp (!) 24   Ht '5\' 10"'$  (1.778 m)   Wt 74.2 kg   SpO2 93%   BMI 23.47 kg/m  Pain Scale: 0-10   Pain Score: 0-No pain   SpO2: SpO2: 93 % O2 Device:SpO2: 93 % O2 Flow Rate: .   IO: Intake/output summary:  Intake/Output Summary (Last 24 hours) at 10/04/2021 1305 Last data filed at 10/04/2021 1240 Gross per 24 hour  Intake 2917.79 ml  Output 1550 ml  Net 1367.79 ml    LBM: Last BM Date :  (prior to arrival) Baseline Weight: Weight: 74.4 kg Most recent weight: Weight: 74.2 kg     Palliative Assessment/Data:   Flowsheet Rows    Flowsheet Row Most Recent Value  Intake Tab   Referral Department Hospitalist  Unit at Time of Referral Intermediate Care Unit  Palliative Care Primary Diagnosis Sepsis/Infectious Disease  Date Notified 10/03/21  Palliative Care Type Return patient Palliative Care  Reason for referral Clarify Goals of Care  Date of Admission 10/03/21  Date first seen by Palliative Care 10/04/21  # of days  Palliative referral response time 1 Day(s)  # of days IP prior to Palliative referral 0  Clinical Assessment   Palliative Performance Scale Score 20%  Pain Max last 24 hours Not able to report  Pain Min Last 24 hours Not able to report  Dyspnea Max Last 24 Hours Not able to report  Dyspnea Min Last 24 hours Not able to report  Psychosocial & Spiritual Assessment   Palliative Care Outcomes        Time In: 1000 Time Out: 1115 Time Total: 75 minutes  Greater than 50%  of this time was spent counseling and coordinating care related to the above assessment and plan.  Signed by: Drue Novel, NP   Please contact Palliative Medicine Team phone at [phone] for questions and concerns.  For individual provider: See Shea Evans

## 2021-10-04 NOTE — Progress Notes (Signed)
CBG of 37. Hypoglycemic protocol initiated. D50 given via IV.

## 2021-10-04 NOTE — Progress Notes (Signed)
Inpatient Diabetes Program Recommendations  AACE/ADA: New Consensus Statement on Inpatient Glycemic Control (2015)  Target Ranges:  Prepandial:   less than 140 mg/dL      Peak postprandial:   less than 180 mg/dL (1-2 hours)      Critically ill patients:  140 - 180 mg/dL   Lab Results  Component Value Date   GLUCAP 88 10/04/2021   HGBA1C 6.2 (H) 09/19/2020    Review of Glycemic Control  Latest Reference Range & Units 10/03/21 22:30 10/04/21 04:24 10/04/21 08:22  Glucose-Capillary 70 - 99 mg/dL 203 (H) 109 (H) 88   Diabetes history: None  Current orders for Inpatient glycemic control:  Novolog 0-9 units q 4 hours Levemir 6 units bid Solumedrol 37.2 mg IV q 12 hours Inpatient Diabetes Program Recommendations:   Consider d/c of basal insulin-Levemir.   Thanks,  Adah Perl, RN, BC-ADM Inpatient Diabetes Coordinator Pager [phone]  (8a-5p)

## 2021-10-04 NOTE — Progress Notes (Signed)
Initial Nutrition Assessment  DOCUMENTATION CODES:      INTERVENTION:  -When/if patient is cleared to resume tube feeding: Recommend resume-continuous Osmolite 1.5 @ 40 ml/hr and ProSource TF20 60 ml BID per PEG.   -If no IVF- add free water flushes 200 ml QID  Provides-1600 kcal, 100 gr protein, 731 ml water from formula. Free water adds 800 ml daily.   NUTRITION DIAGNOSIS:  Inadequate oral intake related to inability to eat as evidenced by NPO status (chronic PEG).   GOAL:   (based on outcome of Palliative conversations with patient and family.)   MONITOR:  TF tolerance, Labs, Weight trends (when/if patient desires to continue TF)  REASON FOR ASSESSMENT:  Chronic PEG tube     ASSESSMENT: Patient is a 88 yo male presents from SNF with sepsis, COVID positive. History of CHF, ileus, Dysphagia, PEG placement 05/03/20.  Palliative is following. Patient is a DNR.   Discussed nutrition support with nursing and reached out to Dr Dyann Kief. Recommendations noted above.   Intake/Output Summary (Last 24 hours) at 10/04/2021 1413 Last data filed at 10/04/2021 1334 Gross per 24 hour  Intake 3017.79 ml  Output 1550 ml  Net 1467.79 ml    Medications: vitamin C (500 bid), zinc (220 mg-daily), insulin  Weight has been ranging 163-166 lb most of the past year.   CRP-25, Sodium 151     Latest Ref Rng & Units 10/04/2021    9:19 AM 10/03/2021    2:32 PM 10/01/2021   12:40 PM  BMP  Glucose 70 - 99 mg/dL 99  337  257   BUN 8 - 23 mg/dL 104  100  70   Creatinine 0.61 - 1.24 mg/dL 1.94  2.29  1.55   Sodium 135 - 145 mmol/L 151  148  145   Potassium 3.5 - 5.1 mmol/L 3.5  4.3  3.2   Chloride 98 - 111 mmol/L 109  100  100   CO2 22 - 32 mmol/L 31  33  32   Calcium 8.9 - 10.3 mg/dL 8.5  8.3  8.0       NUTRITION - FOCUSED PHYSICAL EXAM: deferred  Diet Order:   Diet Order             Diet NPO time specified  Diet effective now                   EDUCATION NEEDS:     Skin:    intact   Last BM:  9/25 medium BM per facility  Height:   Ht Readings from Last 1 Encounters:  10/03/21 '5\' 10"'$  (1.778 m)    Weight:   Wt Readings from Last 1 Encounters:  10/03/21 74.2 kg    Ideal Body Weight:   75 kg  BMI:  Body mass index is 23.47 kg/m.  Estimated Nutritional Needs:   Kcal:  1900-2100  Protein:  90-95 gr  Fluid:  < 2 liters daily  Colman Cater MS,RD,CSG,LDN Contact: Shea Evans

## 2021-10-04 NOTE — TOC Initial Note (Addendum)
Transition of Care [facility]) - Initial/Assessment Note    Patient Details  Name: Dominic Hodges MRN: [id number] Date of Birth: [birth year]  Transition of [facility]) CM/SW Contact:    Salome Arnt, LCSW Phone Number: 10/04/2021, 3:18 PM  Clinical Narrative:  Pt admitted with sepsis likely due to aspiration pneumonia. Pt is COVID positive. Currently on BiPAP. He is a resident at [facility]. Per Marianna Fuss at [facility], pt was supposed to go home with hospice this past Monday, but he tested positive for COVID on Sunday so remained at [facility]. Palliative consulted and had discussion with daughter today. Pt is a DNR. Marianna Fuss said pt will be private pay if he returns as he has been unable to work with therapy. LCSW left voicemail for pt's daughter requesting return call. TOC will continue to follow and address d/c needs.                  *Update: Pt's daughter returned call and indicates she will talk to [facility] and her siblings to make decision. She is aware pt would be private pay if returns to [facility]. May consider return to [facility] with hospice. Marianna Fuss will discuss with family as well.   Expected Discharge Plan: Skilled Nursing Facility Barriers to Discharge: Continued Medical Work up   Patient Goals and CMS Choice Patient states their goals for this hospitalization and ongoing recovery are:: unsure- waiting on return call from family      Expected Discharge Plan and Services Expected Discharge Plan: [place] In-house Referral: Clinical Social Work, Hospice / [place] arrangements for the past 2 months: [place]                                      Prior Living Arrangements/Services Living arrangements for the past 2 months: [place] Lives with:: Facility Resident Patient language and need for interpreter reviewed:: Yes        Need for Family Participation in Patient Care: Yes (Comment)     Criminal Activity/Legal Involvement  Pertinent to Current Situation/Hospitalization: No - Comment as needed  Activities of Daily Living Home Assistive Devices/Equipment: Other (Comment) ADL Screening (condition at time of admission) Patient's cognitive ability adequate to safely complete daily activities?: Yes Is the patient deaf or have difficulty hearing?: Yes Does the patient have difficulty seeing, even when wearing glasses/contacts?: Yes Does the patient have difficulty concentrating, remembering, or making decisions?: No Patient able to express need for assistance with ADLs?: Yes Does the patient have difficulty dressing or bathing?: Yes Independently performs ADLs?: No Communication: Needs assistance Is this a change from baseline?: Pre-admission baseline Dressing (OT): Needs assistance Is this a change from baseline?: Pre-admission baseline Grooming: Needs assistance Is this a change from baseline?: Pre-admission baseline Feeding: Needs assistance Is this a change from baseline?: Pre-admission baseline Bathing: Needs assistance Is this a change from baseline?: Pre-admission baseline Toileting: Needs assistance Is this a change from baseline?: Pre-admission baseline In/Out Bed: Needs assistance Is this a change from baseline?: Pre-admission baseline Walks in Home: Needs assistance Is this a change from baseline?: Pre-admission baseline Does the patient have difficulty walking or climbing stairs?: Yes Weakness of Legs: Both Weakness of Arms/Hands: Both  Permission Sought/Granted                  Emotional Assessment   Attitude/Demeanor/Rapport: Unable to Assess Affect (  typically observed): Unable to Assess Orientation: : Oriented to Self, Oriented to Place Alcohol / Substance Use: Not Applicable Psych Involvement: No (comment)  Admission diagnosis:  Acute respiratory distress [R06.03] Acute kidney injury ([place]) [N17.9] Sepsis ([place]) [A41.9] Sepsis, due to unspecified organism, unspecified whether  acute organ dysfunction present ([place]) [A41.9] COVID [U07.1] Patient Active Problem List   Diagnosis Date Noted   COVID-19 virus infection 10/03/2021   Sepsis ([place]) 10/03/2021   AKI (acute kidney injury) ([place]) 54/65/0354   Chronic systolic CHF (congestive heart failure) ([place]) 10/03/2021   Aspiration pneumonia ([place]) 10/03/2021   Hyperglycemia 10/03/2021   Vascular dementia without behavioral disturbance ([place]) 09/21/2021   G tube feedings ([place]) 09/18/2021   HCAP (healthcare-associated pneumonia) 09/14/2021   Stage 3 chronic kidney disease due to benign hypertension ([place]) 09/04/2021   Benign hypertension with CKD (chronic kidney disease) stage III (HCC) 09/04/2021   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   Atrial fibrillation ([place]) 65/68/1275   Acute systolic CHF (congestive heart failure) ([place]) 08/30/2021   Goals of care, counseling/discussion 08/30/2021   Acute respiratory failure with hypoxia (HCC) 08/29/2021    Class: Acute   Ileus ([place]) 08/29/2021    Class: Acute   Elevated troponin 08/27/2021   Hypotensive episode 08/27/2021    Class: Acute   Hyponatremia 08/26/2021   Cellulitis 08/26/2021   Generalized weakness 08/26/2021   GERD (gastroesophageal reflux disease) 08/26/2021   Gastrostomy tube in place ([place]) 10/06/2020   Neurocognitive deficits 04/28/2020   History of anesthesia complications 17/00/1749   Recent weight loss 04/14/2020   Normochromic normocytic anemia 04/14/2020   Dysphagia, oropharyngeal 04/14/2020   Chronic cough 04/13/2020   Anorexia 04/07/2020   Failure to thrive in adult 04/07/2020   Protein-calorie malnutrition, severe ([place]) 03/16/2020   Chronic constipation 03/15/2020   Acute blood loss anemia 03/15/2020   Thrombocytopenia ([place]) 03/15/2020   Closed left femoral fracture ([employer]) 03/07/2020   Accidental fall 03/07/2020   CKD (chronic kidney disease), stage III ([place]) 03/07/2020   Spinal stenosis, lumbar region, with neurogenic claudication 10/19/2015   Closed  right hip fracture ([place]) 12/05/2014   Closed hip fracture ([place]) 12/05/2014   BPH (benign prostatic hyperplasia) 12/05/2014   Hyperlipidemia 12/05/2014   Essential hypertension, benign 06/01/2010   PCP:  Celene Squibb, MD Pharmacy:   [facility], [address] [place] [address] Phone: [phone] Fax: [place], [address] [place] [address] Phone: [phone] Fax: [phone]     Social Determinants of Health (SDOH) Interventions    Readmission Risk Interventions    10/04/2021    3:16 PM 08/30/2021    4:00 PM  Readmission Risk Prevention Plan  Transportation Screening Complete Complete  PCP or Specialist Appt within 5-7 Days  Not Complete  Home Care Screening  Complete  Medication Review (RN CM)  Complete  HRI or Home Care Consult Complete   Social Work Consult for Recovery Care Planning/Counseling Complete   Palliative Care Screening Complete   Medication Review Press photographer) Complete

## 2021-10-05 DIAGNOSIS — Z7189 Other specified counseling: Secondary | ICD-10-CM | POA: Diagnosis not present

## 2021-10-05 DIAGNOSIS — J9601 Acute respiratory failure with hypoxia: Secondary | ICD-10-CM | POA: Diagnosis not present

## 2021-10-05 DIAGNOSIS — A419 Sepsis, unspecified organism: Secondary | ICD-10-CM | POA: Diagnosis not present

## 2021-10-05 DIAGNOSIS — U071 COVID-19: Secondary | ICD-10-CM | POA: Diagnosis not present

## 2021-10-05 DIAGNOSIS — N179 Acute kidney failure, unspecified: Secondary | ICD-10-CM | POA: Diagnosis not present

## 2021-10-05 LAB — CBC WITH DIFFERENTIAL/PLATELET
Abs Immature Granulocytes: 2.4 10*3/uL — ABNORMAL HIGH (ref 0.00–0.07)
Band Neutrophils: 2 %
Basophils Absolute: 0 10*3/uL (ref 0.0–0.1)
Basophils Relative: 0 %
Eosinophils Absolute: 0 10*3/uL (ref 0.0–0.5)
Eosinophils Relative: 0 %
HCT: 44.4 % (ref 39.0–52.0)
Hemoglobin: 14.6 g/dL (ref 13.0–17.0)
Lymphocytes Relative: 4 %
Lymphs Abs: 0.5 10*3/uL — ABNORMAL LOW (ref 0.7–4.0)
MCH: 30.7 pg (ref 26.0–34.0)
MCHC: 32.9 g/dL (ref 30.0–36.0)
MCV: 93.3 fL (ref 80.0–100.0)
Metamyelocytes Relative: 13 %
Monocytes Absolute: 0.1 10*3/uL (ref 0.1–1.0)
Monocytes Relative: 1 %
Myelocytes: 4 %
Neutro Abs: 9.5 10*3/uL — ABNORMAL HIGH (ref 1.7–7.7)
Neutrophils Relative %: 74 %
Platelets: 195 10*3/uL (ref 150–400)
Promyelocytes Relative: 2 %
RBC: 4.76 MIL/uL (ref 4.22–5.81)
RDW: 14.6 % (ref 11.5–15.5)
WBC: 12.5 10*3/uL — ABNORMAL HIGH (ref 4.0–10.5)
nRBC: 0 % (ref 0.0–0.2)

## 2021-10-05 LAB — GLUCOSE, CAPILLARY
Glucose-Capillary: 74 mg/dL (ref 70–99)
Glucose-Capillary: 81 mg/dL (ref 70–99)
Glucose-Capillary: 88 mg/dL (ref 70–99)
Glucose-Capillary: 98 mg/dL (ref 70–99)
Glucose-Capillary: 123 mg/dL — ABNORMAL HIGH (ref 70–99)
Glucose-Capillary: 129 mg/dL — ABNORMAL HIGH (ref 70–99)

## 2021-10-05 LAB — COMPREHENSIVE METABOLIC PANEL
ALT: 23 U/L (ref 0–44)
AST: 32 U/L (ref 15–41)
Albumin: 2.3 g/dL — ABNORMAL LOW (ref 3.5–5.0)
Alkaline Phosphatase: 70 U/L (ref 38–126)
Anion gap: 13 (ref 5–15)
BUN: 118 mg/dL — ABNORMAL HIGH (ref 8–23)
CO2: 31 mmol/L (ref 22–32)
Calcium: 8.4 mg/dL — ABNORMAL LOW (ref 8.9–10.3)
Chloride: 108 mmol/L (ref 98–111)
Creatinine, Ser: 2.08 mg/dL — ABNORMAL HIGH (ref 0.61–1.24)
GFR, Estimated: 29 mL/min — ABNORMAL LOW (ref >60)
Glucose, Bld: 85 mg/dL (ref 70–99)
Potassium: 3.2 mmol/L — ABNORMAL LOW (ref 3.5–5.1)
Sodium: 152 mmol/L — ABNORMAL HIGH (ref 135–145)
Total Bilirubin: 1.3 mg/dL — ABNORMAL HIGH (ref 0.3–1.2)
Total Protein: 6.4 g/dL — ABNORMAL LOW (ref 6.5–8.1)

## 2021-10-05 LAB — MAGNESIUM: Magnesium: 2.7 mg/dL — ABNORMAL HIGH (ref 1.7–2.4)

## 2021-10-05 LAB — PROCALCITONIN: Procalcitonin: 2.74 ng/mL

## 2021-10-05 MED ORDER — OSMOLITE 1.5 CAL PO LIQD
1000.0000 mL | ORAL | Status: DC
  Administered 2021-10-05 – 2021-10-08 (×3): 1000 mL

## 2021-10-05 MED ORDER — FREE WATER
200.0000 mL | Freq: Four times a day (QID) | Status: DC
  Administered 2021-10-05 – 2021-10-10 (×20): 200 mL

## 2021-10-05 MED ORDER — OSMOLITE 1.5 CAL PO LIQD: 1000.0000 mL | ORAL | Status: DC

## 2021-10-05 NOTE — Care Management Important Message (Signed)
Important Message  Patient Details  Name: Dominic Hodges MRN: [id number] Date of Birth: 1927/11/22   Medicare Important Message Given:  N/A - LOS <3 / Initial given by admissions     [name] 10/05/2021, 11:23 AM

## 2021-10-05 NOTE — Progress Notes (Signed)
Progress Note   Patient: Dominic Hodges QQI:[id number] DOB: 11-18-1927 DOA: 10/03/2021     2 DOS: the patient was seen and examined on 10/05/2021   Brief hospital course: As per H&P written by Dr. Francine Graven on 10/03/2021 Dominic Hodges is a 88 y.o. male with medical history significant for persistent atrial fibrillation, chronic systolic CHF, hypertension, history of oropharyngeal dysphagia status post PEG tube placement who was brought into the ER by EMS from [facility] where he resides in respiratory distress. Patient tested positive for COVID-19 viral infection on 10/01/21.  His wife is currently hospitalized for COVID-19 infection and so patient was tested and his results came back positive.  He was brought into the ER for worsening respiratory distress and upon arrival was noted to have a fever with a Tmax of 102.8, pulse oximetry on high flow oxygen of 86%, respiratory rate of 15, heart rate 109 and blood pressure 94/55. He was immediately placed on BiPAP in the ER with some improvement in his work of breathing.  Pulse oximetry on BiPAP is anywhere from 88 to 90%. ABG 7.52/48/58/39.2/89.5. Labs show lactic acid level of 3.0 Chest x-ray reviewed by me shows Similar heterogeneous airspace opacities in the left greater than right lung base suggestive of pneumonia. CT scan of the chest without contrast showed new areas of dense basilar consolidation in the lower lobes,endobronchial material and patchy areas of ground-glass and discrete nodularity discussed above, compatible with aspiration related pneumonia. 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 Mild cardiomegaly, aortic atherosclerosis and coronary artery disease. He received sepsis IV fluids in the ER as well as empiric antibiotic therapy with Flagyl, vancomycin and cefepime. I am unable to do a review of systems on this patient due to his underlying dysarthria and respiratory failure requiring BiPAP  Assessment  and Plan: * Sepsis ([place]) -As evidenced by fever with a Tmax of 102.8, tachycardia, tachypnea, hypotension that responded to IV fluid resuscitation as well as lactic acidosis. -Source of sepsis appears to be secondary to aspiration pneumonia with associated acute respiratory failure with hypoxia requiring the need of BiPAP at time of admission.. -Patient with recent COVID infection. -Continue empiric antibiotic therapy with Unasyn -Follow-up results of blood cultures -Continue to wean oxygen supplementation as tolerated; currently on 3-5 L high flow nasal cannula with good saturation demonstrated. -Continue bronchodilator management and supportive care.   Aspiration pneumonia ([place]) -Secondary to known history of oropharyngeal dysphagia status post PEG tube insertion -Strict aspiration precaution -Continue current antibiotics. -Attempt to resume tube feedings; may need adjustment in red velocity.  Acute respiratory failure with hypoxia [facility]) -Patient presented to the ER in respiratory distress and was noted to to be tachypneic with increased work of breathing and use of accessory muscles. -86% oxygen saturation on room air; patient has required to be placed on BiPAP -Continue current supportive care, bronchodilator management and IV antibiotic. -continue to wean off oxygen as tolerated; currently off BIPAP  Dysphagia, oropharyngeal -Patient with known history of oropharyngeal dysphagia admitted to the hospital with aspiration pneumonia. -Strict aspiration precautions -Attempt to resume tube feedings.    COVID-19 virus infection -Patient tested positive for COVID-19 virus on 10/01/21 -Was started on molnupiravir as an outpatient -Continue treatment with remdesivir and steroid -Wean off oxygen supplementation as tolerated -Continue contact precaution -Continue bronchodilator management and antitussive/mucolytic regimen as required. -Follow inflammatory markers.  Hyperglycemia -  Hyperglycemia in the setting of his steroids and tube feedings -Prior history of  diabetes -Continue sliding scale insulin and follow CBGs fluctuation.  Chronic systolic CHF (congestive heart failure) (HCC) -Last known LVEF of 45 to 50% from a 2D echocardiogram which was done 08/27/21 -Stable/compensated currently -Continue to follow daily weights and strict I's and O's -Continue judicious fluid resuscitation in the setting of acute sepsis.  AKI (acute kidney injury) ([place]) -Patient has a baseline serum creatinine of 1.9 and creatinine peaked at 2.29 at time of admission -Worsening renal function appears to be secondary to prerenal azotemia, hypotension and continue use of nephrotoxic agents -Avoid/minimize nephrotoxic agents -Maintain adequate hydration -Follow renal function trend.   Atrial fibrillation (HCC) -Continue Eliquis as primary prophylaxis for an acute stroke. -Rate controlled currently.  Severe protein calorie malnutrition -Chronically receiving nutrition through PEG tube. -Will resume tube feedings -Follow response. -Follow CBGs and electrolytes.  Hypoglycemic event -In the setting of receiving insulin therapy while no eating or getting tube feedings. -Adequately corrected -Continue to follow CBGs.  Subjective:  Afebrile, no chest pain, no nausea, no vomiting.  Currently off BiPAP and maintaining good saturation on 3-5 L of high flow nasal cannula supplementation.  Still demonstrating intermittent episode of tachypnea and short winded sensation with minimal activity.  1 episode of hypoglycemia appreciated overnight.  Physical Exam: Vitals:   10/05/21 0753 10/05/21 0758 10/05/21 0800 10/05/21 0900  BP:   114/82 110/65  Pulse: 85 84 82 (!) 33  Resp: (!) 34 (!) 32 (!) 42 (!) 42  Temp:      TempSrc:      SpO2: 99% 97% 95% 93%  Weight:      Height:       General exam: Alert, awake, oriented and able to follow commands appropriately.  No fever, no chest pain, no  nausea or vomiting reported.  Patient is off BiPAP. Respiratory system: Still demonstrating tachypnea and increased use of accessory muscles intermittently.  No crackles on exam. Cardiovascular system: Rate controlled, no rubs, no gallops, no JVD on exam. Gastrointestinal system: Abdomen is nondistended, soft and nontender. No organomegaly or masses felt. Normal bowel sounds heard.  PEG tube in place. Central nervous system: Alert and oriented. No focal neurological deficits. Extremities: No cyanosis or clubbing. Skin: No petechiae. Psychiatry: Flat affect appreciated on exam.  Data Reviewed: Procalcitonin level 3.13 CBC: WBCs 11.2, hemoglobin 13.7 and platelet count 180 K Complete metabolic panel: Sodium 335, potassium 3.5, chloride 109, bicarb 31, BUN 104, creatinine 1.94, overall normal LFTs.  Anion gap 11. Magnesium: 2.6  Family Communication: No family at bedside.  Disposition: Status is: Inpatient Remains inpatient appropriate because: Still requiring treatment for acute respiratory failure with hypoxia in the setting of healthcare associated pneumonia/aspiration and COVID infection.   Planned Discharge Destination: Skilled nursing facility  Author: Barton Dubois, MD 10/05/2021 9:33 AM  For on call review [email or web address].

## 2021-10-05 NOTE — Progress Notes (Signed)
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  He appears acutely/chronically ill and very frail.  He is somewhat improved clinically from yesterday.  I believe that he can make his basic needs known.  There is no family at bedside at this time as his wife has been hospitalized with COVID and his daughter is also ill with COVID.  Detail conference with bedside nursing staff related to patient condition, needs.  Call to daughter/healthcare surrogate, [name] to talk about disposition.  She states family is considering how much care they would be able to provide, even with private pay caregivers, at home.  Feel that returning to [facility] with hospice care would be best.  [name] has appropriate questions and I refer her to speak with hospice of [facility].  Conference with attending, bedside nursing staff, transition of care team related to patient condition, needs, goals of care, disposition.  Plan: At this point continue to treat the treatable but no CPR or intubation.  Considering return to [facility] with hospice of [facility].   77 minutes    Quinn Axe, NP Palliative medicine team Team phone [phone] Greater than 50% of this time was spent counseling and coordinating care related to the above assessment and plan.

## 2021-10-06 DIAGNOSIS — U071 COVID-19: Secondary | ICD-10-CM | POA: Diagnosis not present

## 2021-10-06 DIAGNOSIS — J9601 Acute respiratory failure with hypoxia: Secondary | ICD-10-CM | POA: Diagnosis not present

## 2021-10-06 DIAGNOSIS — A419 Sepsis, unspecified organism: Secondary | ICD-10-CM | POA: Diagnosis not present

## 2021-10-06 DIAGNOSIS — N179 Acute kidney failure, unspecified: Secondary | ICD-10-CM | POA: Diagnosis not present

## 2021-10-06 LAB — COMPREHENSIVE METABOLIC PANEL
ALT: 19 U/L (ref 0–44)
AST: 23 U/L (ref 15–41)
Albumin: 2.1 g/dL — ABNORMAL LOW (ref 3.5–5.0)
Alkaline Phosphatase: 61 U/L (ref 38–126)
Anion gap: 12 (ref 5–15)
BUN: 134 mg/dL — ABNORMAL HIGH (ref 8–23)
CO2: 29 mmol/L (ref 22–32)
Calcium: 8 mg/dL — ABNORMAL LOW (ref 8.9–10.3)
Chloride: 111 mmol/L (ref 98–111)
Creatinine, Ser: 2.2 mg/dL — ABNORMAL HIGH (ref 0.61–1.24)
GFR, Estimated: 27 mL/min — ABNORMAL LOW (ref >60)
Glucose, Bld: 332 mg/dL — ABNORMAL HIGH (ref 70–99)
Potassium: 2.8 mmol/L — ABNORMAL LOW (ref 3.5–5.1)
Sodium: 152 mmol/L — ABNORMAL HIGH (ref 135–145)
Total Bilirubin: 1.1 mg/dL (ref 0.3–1.2)
Total Protein: 5.7 g/dL — ABNORMAL LOW (ref 6.5–8.1)

## 2021-10-06 LAB — CBC WITH DIFFERENTIAL/PLATELET
Abs Immature Granulocytes: 0.04 10*3/uL (ref 0.00–0.07)
Basophils Absolute: 0 10*3/uL (ref 0.0–0.1)
Basophils Relative: 0 %
Eosinophils Absolute: 0 10*3/uL (ref 0.0–0.5)
Eosinophils Relative: 0 %
HCT: 41.7 % (ref 39.0–52.0)
Hemoglobin: 13.8 g/dL (ref 13.0–17.0)
Immature Granulocytes: 1 %
Lymphocytes Relative: 6 %
Lymphs Abs: 0.4 10*3/uL — ABNORMAL LOW (ref 0.7–4.0)
MCH: 30.8 pg (ref 26.0–34.0)
MCHC: 33.1 g/dL (ref 30.0–36.0)
MCV: 93.1 fL (ref 80.0–100.0)
Monocytes Absolute: 0.2 10*3/uL (ref 0.1–1.0)
Monocytes Relative: 3 %
Neutro Abs: 5.5 10*3/uL (ref 1.7–7.7)
Neutrophils Relative %: 90 %
Platelets: 187 10*3/uL (ref 150–400)
RBC: 4.48 MIL/uL (ref 4.22–5.81)
RDW: 15 % (ref 11.5–15.5)
WBC: 6.1 10*3/uL (ref 4.0–10.5)
nRBC: 0.3 % — ABNORMAL HIGH (ref 0.0–0.2)

## 2021-10-06 LAB — GLUCOSE, CAPILLARY
Glucose-Capillary: 223 mg/dL — ABNORMAL HIGH (ref 70–99)
Glucose-Capillary: 236 mg/dL — ABNORMAL HIGH (ref 70–99)
Glucose-Capillary: 290 mg/dL — ABNORMAL HIGH (ref 70–99)
Glucose-Capillary: 296 mg/dL — ABNORMAL HIGH (ref 70–99)
Glucose-Capillary: 316 mg/dL — ABNORMAL HIGH (ref 70–99)
Glucose-Capillary: 323 mg/dL — ABNORMAL HIGH (ref 70–99)

## 2021-10-06 LAB — MAGNESIUM: Magnesium: 2.8 mg/dL — ABNORMAL HIGH (ref 1.7–2.4)

## 2021-10-06 MED ORDER — POTASSIUM CHLORIDE 10 MEQ/100ML IV SOLN
10.0000 meq | INTRAVENOUS | Status: AC
  Administered 2021-10-06 (×3): 10 meq via INTRAVENOUS
  Filled 2021-10-06 (×3): qty 100

## 2021-10-06 MED ORDER — METOCLOPRAMIDE HCL 5 MG/ML IJ SOLN
5.0000 mg | Freq: Three times a day (TID) | INTRAMUSCULAR | Status: AC
  Administered 2021-10-06 – 2021-10-08 (×5): 5 mg via INTRAVENOUS
  Filled 2021-10-06 (×5): qty 2

## 2021-10-06 MED ORDER — POTASSIUM CHLORIDE 20 MEQ PO PACK
20.0000 meq | PACK | ORAL | Status: AC
  Administered 2021-10-06 (×3): 20 meq
  Filled 2021-10-06 (×3): qty 1

## 2021-10-06 MED ORDER — PROSOURCE TF20 ENFIT COMPATIBL EN LIQD
60.0000 mL | Freq: Two times a day (BID) | ENTERAL | Status: DC
  Administered 2021-10-06 – 2021-10-11 (×11): 60 mL
  Filled 2021-10-06 (×11): qty 60

## 2021-10-06 MED ORDER — INSULIN DETEMIR 100 UNIT/ML ~~LOC~~ SOLN
7.0000 [IU] | Freq: Every day | SUBCUTANEOUS | Status: DC
  Administered 2021-10-06 – 2021-10-10 (×5): 7 [IU] via SUBCUTANEOUS
  Filled 2021-10-06 (×6): qty 0.07

## 2021-10-06 MED ORDER — DEXTROSE 5 % IV SOLN: INTRAVENOUS | Status: AC

## 2021-10-06 NOTE — Care Management Important Message (Signed)
Important Message  Patient Details  Name: Dominic Hodges MRN: [id number] Date of Birth: Feb 11, 1927   Medicare Important Message Given:  Other (see comment) (reviewed letter with daughter [name] at [phone], gave Judithann Graves contact information)     [name] 10/06/2021, 1:48 PM

## 2021-10-06 NOTE — Progress Notes (Signed)
Did CPT using Bed.  Assisted RN by doing mouth care for patient and trying to get him to cough up secretions during CPT.  Patient is currently on 2L [place] with sat of 94 to 100%.  Bipap is at bedside if needed, but patient's rate has been good, if rate increases, will place patient back on Bipap.

## 2021-10-06 NOTE — Progress Notes (Signed)
Pharmacy Antibiotic Note  Dominic Hodges is a 87 y.o. male admitted on 10/03/2021 with  unknown source of infection suspicious of aspiration pneumonia . Pharmacy has been consulted for Unasyn dosing.  Plan: Unasyn 3g Q12h Trend WBC, fever, renal function De-escalate when able   Height: '5\' 10"'$  (177.8 cm) Weight: 70.7 kg (155 lb 13.8 oz) IBW/kg (Calculated) : 73  Temp (24hrs), Avg:97.9 F (36.6 C), Min:97.3 F (36.3 C), Max:98.4 F (36.9 C)  Recent Labs  Lab 10/01/21 1240 10/03/21 1432 10/03/21 1516 10/03/21 1834 10/03/21 2249 10/04/21 0919 10/05/21 0526 10/06/21 0512  WBC 7.3 8.9  --   --   --  11.2* 12.5* 6.1  CREATININE 1.55* 2.29*  --   --   --  1.94* 2.08* 2.20*  LATICACIDVEN  --   --  3.0* 2.8* 2.1*  --   --   --      Estimated Creatinine Clearance: 21.4 mL/min (A) (by C-G formula based on SCr of 2.2 mg/dL (H)).    Allergies  Allergen Reactions   Robaxin [Methocarbamol] Other (See Comments)    Mental confusion    Antimicrobials this admission: Unasyn 9/27 >> Vanco 9/26 x1 Cefepime 9/26 x1 Flagyl 9/26 x1 Remdesivir 9/26 >> 9/28  Microbiology results: 9/26 BCx: ngtd 9/26 UCx: ng MRSA PCR: neg COVID +   Thank you for allowing pharmacy to be a part of this patient's care.  Margot Ables, PharmD Clinical Pharmacist 10/06/2021 11:02 AM

## 2021-10-06 NOTE — TOC Progression Note (Signed)
Transition of Care [facility]) - Progression Note    Patient Details  Name: Jamieson Hetland MRN: [id number] Date of Birth: 1927/12/01  [facility] CM/SW Contact  Shade Flood, LCSW Phone Number: 10/06/2021, 11:24 AM  Clinical Narrative:     TOC following. Per MD, pt may be ready for dc in 2-3 days. Spoke with pt's daughter to review dc planning. Daughter states that at this time, she would like for pt to return to [facility] for further care. She is aware that if pt does not qualify for further Medicare coverage of his stay that it would be a private pay arrangement.   Updated Kerri at [facility] and she will call pt's daughter to work on arrangements.  TOC will follow.  Expected Discharge Plan: Skilled Nursing Facility Barriers to Discharge: Continued Medical Work up  Expected Discharge Plan and Services Expected Discharge Plan: [place] In-house Referral: Clinical Social Work, Hospice / [place] arrangements for the past 2 months: [place]                                       Social Determinants of Health (SDOH) Interventions    Readmission Risk Interventions    10/04/2021    3:16 PM 08/30/2021    4:00 PM  Readmission Risk Prevention Plan  Transportation Screening Complete Complete  PCP or Specialist Appt within 5-7 Days  Not Complete  Home Care Screening  Complete  Medication Review (RN CM)  Complete  HRI or Home Care Consult Complete   Social Work Consult for [place] Planning/Counseling Complete   Palliative Care Screening Complete   Medication Review [employer]) Complete

## 2021-10-06 NOTE — Plan of Care (Signed)
  Problem: Education: Goal: Knowledge of risk factors and measures for prevention of condition will improve Outcome: Not Progressing   Problem: Coping: Goal: Psychosocial and spiritual needs will be supported Outcome: Not Progressing Note: R/t patient being unaware of code status   Problem: Respiratory: Goal: Will maintain a patent airway Outcome: Not Progressing Goal: Complications related to the disease process, condition or treatment will be avoided or minimized Outcome: Not Progressing Note: Needs BIPAP PRN and supplemental oxygen   Problem: Education: Goal: Knowledge of General Education information will improve Description: Including pain rating scale, medication(s)/side effects and non-pharmacologic comfort measures Outcome: Not Progressing   Problem: Health Behavior/Discharge Planning: Goal: Ability to manage health-related needs will improve Outcome: Not Progressing   Problem: Clinical Measurements: Goal: Ability to maintain clinical measurements within normal limits will improve Outcome: Not Progressing Goal: Will remain free from infection Outcome: Not Progressing Goal: Diagnostic test results will improve Outcome: Not Progressing Goal: Respiratory complications will improve Outcome: Not Progressing Goal: Cardiovascular complication will be avoided Outcome: Not Progressing Note: R/t AFib   Problem: Activity: Goal: Risk for activity intolerance will decrease Outcome: Not Progressing Note: R/t total care needed    Problem: Nutrition: Goal: Adequate nutrition will be maintained Outcome: Not Progressing Note: R/t TF with peg prior to admission   Problem: Coping: Goal: Level of anxiety will decrease Outcome: Not Progressing   Problem: Elimination: Goal: Will not experience complications related to bowel motility Outcome: Not Progressing Goal: Will not experience complications related to urinary retention Outcome: Not Progressing   Problem: Pain  Managment: Goal: General experience of comfort will improve Outcome: Progressing   Problem: Safety: Goal: Ability to remain free from injury will improve Outcome: Not Progressing   Problem: Skin Integrity: Goal: Risk for impaired skin integrity will decrease Outcome: Not Progressing Note: R/t total care    Problem: Education: Goal: Knowledge of disease or condition will improve Outcome: Not Progressing Goal: Understanding of medication regimen will improve Outcome: Not Progressing Goal: Individualized Educational Video(s) Outcome: Not Progressing   Problem: Activity: Goal: Ability to tolerate increased activity will improve Outcome: Not Progressing   Problem: Cardiac: Goal: Ability to achieve and maintain adequate cardiopulmonary perfusion will improve Outcome: Not Progressing   Problem: Health Behavior/Discharge Planning: Goal: Ability to safely manage health-related needs after discharge will improve Outcome: Not Progressing

## 2021-10-06 NOTE — Progress Notes (Signed)
Progress Note   Patient: Dominic Hodges WPY:[id number] DOB: 1928/01/08 DOA: 10/03/2021     3 DOS: the patient was seen and examined on 10/06/2021   Brief hospital course: As per H&P written by Dr. Francine Graven on 10/03/2021 Dominic Hodges is a 88 y.o. male with medical history significant for persistent atrial fibrillation, chronic systolic CHF, hypertension, history of oropharyngeal dysphagia status post PEG tube placement who was brought into the ER by EMS from [facility] where he resides in respiratory distress. Patient tested positive for COVID-19 viral infection on 10/01/21.  His wife is currently hospitalized for COVID-19 infection and so patient was tested and his results came back positive.  He was brought into the ER for worsening respiratory distress and upon arrival was noted to have a fever with a Tmax of 102.8, pulse oximetry on high flow oxygen of 86%, respiratory rate of 15, heart rate 109 and blood pressure 94/55. He was immediately placed on BiPAP in the ER with some improvement in his work of breathing.  Pulse oximetry on BiPAP is anywhere from 88 to 90%. ABG 7.52/48/58/39.2/89.5. Labs show lactic acid level of 3.0 Chest x-ray reviewed by me shows Similar heterogeneous airspace opacities in the left greater than right lung base suggestive of pneumonia. CT scan of the chest without contrast showed new areas of dense basilar consolidation in the lower lobes,endobronchial material and patchy areas of ground-glass and discrete nodularity discussed above, compatible with aspiration related pneumonia. 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 Mild cardiomegaly, aortic atherosclerosis and coronary artery disease. He received sepsis IV fluids in the ER as well as empiric antibiotic therapy with Flagyl, vancomycin and cefepime. I am unable to do a review of systems on this patient due to his underlying dysarthria and respiratory failure requiring BiPAP  Assessment  and Plan: * Sepsis ([place]) -As evidenced by fever with a Tmax of 102.8, tachycardia, tachypnea, hypotension that responded to IV fluid resuscitation as well as lactic acidosis. -Source of sepsis appears to be secondary to aspiration pneumonia with associated acute respiratory failure with hypoxia requiring the need of BiPAP at time of admission.. -Overnight secondary to tachypnea and to provide patient with some rest he was placed on BiPAP.  So far since this morning off BiPAP. -Patient with recent COVID infection. -Continue empiric antibiotic therapy with Unasyn -Follow-up results of blood cultures -Continue to wean oxygen supplementation as tolerated; currently on 2-3 L high flow nasal cannula with good saturation demonstrated. -Continue bronchodilator management and supportive care.   Aspiration pneumonia ([place]) -Secondary to known history of oropharyngeal dysphagia status post PEG tube insertion -Strict aspiration precaution -Continue current antibiotics. -Attempt to resume tube feedings; may need adjustment in red velocity.  Acute respiratory failure with hypoxia [facility]) -Patient presented to the ER in respiratory distress and was noted to to be tachypneic with increased work of breathing and use of accessory muscles. -86% oxygen saturation on room air; patient has required to be placed on BiPAP -Continue current supportive care, bronchodilator management and IV antibiotic. -continue to wean off oxygen as tolerated; currently off BIPAP  Dysphagia, oropharyngeal -Patient with known history of oropharyngeal dysphagia admitted to the hospital with aspiration pneumonia. -Strict aspiration precautions -Attempt to resume tube feedings.    COVID-19 virus infection -Patient tested positive for COVID-19 virus on 10/01/21 -Was started on molnupiravir as an outpatient for 3 days prior to admission. -Patient has completed treatment with remdesivir; continue steroids tapering and supportive  care. -  Continue to wean off oxygen supplementation as tolerated -Continue contact precaution -Continue bronchodilator management and antitussive/mucolytic regimen as required. -Follow inflammatory markers. -Initiating chest physiotherapy to further assist with secretion management.  Hyperglycemia - Hyperglycemia in the setting of his steroids and tube feedings -Prior history of diabetes -Continue sliding scale insulin and follow CBGs fluctuation.  Chronic systolic CHF (congestive heart failure) (HCC) -Last known LVEF of 45 to 50% from a 2D echocardiogram which was done 08/27/21 -Stable/compensated currently -Continue to follow Dominic weights and strict I's and O's -Continue judicious fluid resuscitation in the setting of acute sepsis.   AKI (acute kidney injury) ([place]) -Patient has a baseline serum creatinine of 1.9 and creatinine peaked at 2.29 at time of admission. -Creatinine trending down.  Current creatinine level 2.20 -Worsening renal function appears to be secondary to prerenal azotemia, hypotension and continue use of nephrotoxic agents -Continue to avoid/minimize nephrotoxic agents -Maintain adequate hydration and the use of free water through PEG tube. -Follow renal function trend.   Atrial fibrillation (HCC) -Continue Eliquis as primary prophylaxis for an acute stroke. -Rate controlled currently.  Severe protein calorie malnutrition -Chronically receiving nutrition through PEG tube. -Will resume tube feedings; trying to keep a slower rate to minimize chances of residual and aspiration. -Continue to follow clinical response. -Follow CBGs and electrolytes; further replete as needed..  Hypoglycemic event -In the setting of receiving insulin therapy while no eating or getting tube feedings. -Adequately corrected -No further episode of hypoglycemic event appreciated. -Continue to follow CBGs.  Hypokalemia/hypernatremia -Continue to replete as needed and follow  electrolytes trend. -Maintaining adequate hydration from a sodium standpoint; continue to follow electrolytes trend.  Subjective:  No fever, diffuse rhonchi, intermittent tachypnea and requiring 2-3 L high flow nasal cannula supplementation currently.  Off BiPAP.  Physical Exam: Vitals:   10/06/21 0800 10/06/21 0900 10/06/21 0918 10/06/21 0924  BP: 107/88 103/62 103/62   Pulse: 76 83 81   Resp: (!) 26 (!) 21 19   Temp:    (!) 97.3 F (36.3 C)  TempSrc:      SpO2: 96% 95% 99% 97%  Weight:      Height:       General exam: Alert, awake, oriented and following commands appropriately.  Weak, frail and chronically ill in appearance. Respiratory system: Diffuse rhonchi and intermittent tachypnea appreciated; no crackles, no wheezing.  No use of accessory muscles appreciated on examination. Cardiovascular system:Rate controlled, no rubs, no gallops, no JVD on exam. Gastrointestinal system: Abdomen is nondistended, soft and nontender. No organomegaly or masses felt. Normal bowel sounds heard.  PEG tube in place. Central nervous system: Alert and oriented. No focal neurological deficits. Extremities: No cyanosis or clubbing.  No edema. Skin: No petechiae. Psychiatry: Judgement and insight appear normal.  Flat affect.  Data Reviewed: CBC: WBCs 6.1, hemoglobin 13.8 and platelet count 187 K Comprehensive metabolic panel: Sodium 400, potassium 2.8, chloride 111, bicarb 29, BUN 134, normal LFTs; creatinine 2.20 Magnesium 2.8  Family Communication: No family at bedside.  Disposition: Status is: Inpatient Remains inpatient appropriate because: Still requiring treatment for acute respiratory failure with hypoxia in the setting of healthcare associated pneumonia/aspiration and COVID infection.   Planned Discharge Destination: Skilled nursing facility  Author: Barton Dubois, MD 10/06/2021 10:51 AM  For on call review [email or web address].

## 2021-10-06 NOTE — Progress Notes (Signed)
Patient is resting comfortably in room.  Will do CPT when I place him on Bipap for the night.

## 2021-10-07 DIAGNOSIS — A419 Sepsis, unspecified organism: Secondary | ICD-10-CM | POA: Diagnosis not present

## 2021-10-07 DIAGNOSIS — J9601 Acute respiratory failure with hypoxia: Secondary | ICD-10-CM | POA: Diagnosis not present

## 2021-10-07 DIAGNOSIS — U071 COVID-19: Secondary | ICD-10-CM | POA: Diagnosis not present

## 2021-10-07 DIAGNOSIS — N179 Acute kidney failure, unspecified: Secondary | ICD-10-CM | POA: Diagnosis not present

## 2021-10-07 LAB — COMPREHENSIVE METABOLIC PANEL
ALT: 18 U/L (ref 0–44)
AST: 21 U/L (ref 15–41)
Albumin: 2.3 g/dL — ABNORMAL LOW (ref 3.5–5.0)
Alkaline Phosphatase: 65 U/L (ref 38–126)
Anion gap: 10 (ref 5–15)
BUN: 135 mg/dL — ABNORMAL HIGH (ref 8–23)
CO2: 30 mmol/L (ref 22–32)
Calcium: 8.1 mg/dL — ABNORMAL LOW (ref 8.9–10.3)
Chloride: 106 mmol/L (ref 98–111)
Creatinine, Ser: 1.97 mg/dL — ABNORMAL HIGH (ref 0.61–1.24)
GFR, Estimated: 31 mL/min — ABNORMAL LOW (ref >60)
Glucose, Bld: 295 mg/dL — ABNORMAL HIGH (ref 70–99)
Potassium: 4.3 mmol/L (ref 3.5–5.1)
Sodium: 146 mmol/L — ABNORMAL HIGH (ref 135–145)
Total Bilirubin: 1.2 mg/dL (ref 0.3–1.2)
Total Protein: 6 g/dL — ABNORMAL LOW (ref 6.5–8.1)

## 2021-10-07 LAB — CBC WITH DIFFERENTIAL/PLATELET
Abs Immature Granulocytes: 0.08 10*3/uL — ABNORMAL HIGH (ref 0.00–0.07)
Basophils Absolute: 0 10*3/uL (ref 0.0–0.1)
Basophils Relative: 0 %
Eosinophils Absolute: 0 10*3/uL (ref 0.0–0.5)
Eosinophils Relative: 0 %
HCT: 43.7 % (ref 39.0–52.0)
Hemoglobin: 14.3 g/dL (ref 13.0–17.0)
Immature Granulocytes: 1 %
Lymphocytes Relative: 4 %
Lymphs Abs: 0.3 10*3/uL — ABNORMAL LOW (ref 0.7–4.0)
MCH: 30.9 pg (ref 26.0–34.0)
MCHC: 32.7 g/dL (ref 30.0–36.0)
MCV: 94.4 fL (ref 80.0–100.0)
Monocytes Absolute: 0.3 10*3/uL (ref 0.1–1.0)
Monocytes Relative: 3 %
Neutro Abs: 8.3 10*3/uL — ABNORMAL HIGH (ref 1.7–7.7)
Neutrophils Relative %: 92 %
Platelets: 197 10*3/uL (ref 150–400)
RBC: 4.63 MIL/uL (ref 4.22–5.81)
RDW: 14.9 % (ref 11.5–15.5)
WBC: 9 10*3/uL (ref 4.0–10.5)
nRBC: 0 % (ref 0.0–0.2)

## 2021-10-07 LAB — GLUCOSE, CAPILLARY
Glucose-Capillary: 215 mg/dL — ABNORMAL HIGH (ref 70–99)
Glucose-Capillary: 217 mg/dL — ABNORMAL HIGH (ref 70–99)
Glucose-Capillary: 218 mg/dL — ABNORMAL HIGH (ref 70–99)
Glucose-Capillary: 236 mg/dL — ABNORMAL HIGH (ref 70–99)
Glucose-Capillary: 244 mg/dL — ABNORMAL HIGH (ref 70–99)
Glucose-Capillary: 263 mg/dL — ABNORMAL HIGH (ref 70–99)
Glucose-Capillary: 294 mg/dL — ABNORMAL HIGH (ref 70–99)

## 2021-10-07 LAB — MAGNESIUM: Magnesium: 2.8 mg/dL — ABNORMAL HIGH (ref 1.7–2.4)

## 2021-10-07 MED ORDER — LORAZEPAM 2 MG/ML IJ SOLN
1.0000 mg | Freq: Once | INTRAMUSCULAR | Status: AC
  Administered 2021-10-07: 1 mg via INTRAVENOUS
  Filled 2021-10-07: qty 1

## 2021-10-07 NOTE — Progress Notes (Signed)
Progress Note   Patient: Dominic Hodges [id number] DOB: 10-30-1927 DOA: 10/03/2021     4 DOS: the patient was seen and examined on 10/07/2021   Brief hospital course: As per H&P written by Dr. Francine Graven on 10/03/2021 Dominic Hodges Dominic Hodges is a 88 y.o. male with medical history significant for persistent atrial fibrillation, chronic systolic CHF, hypertension, history of oropharyngeal dysphagia status post PEG tube placement who was brought into the ER by EMS from [facility] where he resides in respiratory distress. Patient tested positive for COVID-19 viral infection on 10/01/21.  His wife is currently hospitalized for COVID-19 infection and so patient was tested and his results came back positive.  He was brought into the ER for worsening respiratory distress and upon arrival was noted to have a fever with a Tmax of 102.8, pulse oximetry on high flow oxygen of 86%, respiratory rate of 15, heart rate 109 and blood pressure 94/55. He was immediately placed on BiPAP in the ER with some improvement in his work of breathing.  Pulse oximetry on BiPAP is anywhere from 88 to 90%. ABG 7.52/48/58/39.2/89.5. Labs show lactic acid level of 3.0 Chest x-ray reviewed by me shows Similar heterogeneous airspace opacities in the left greater than right lung base suggestive of pneumonia. CT scan of the chest without contrast showed new areas of dense basilar consolidation in the lower lobes,endobronchial material and patchy areas of ground-glass and discrete nodularity discussed above, compatible with aspiration related pneumonia. 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 Mild cardiomegaly, aortic atherosclerosis and coronary artery disease. He received sepsis IV fluids in the ER as well as empiric antibiotic therapy with Flagyl, vancomycin and cefepime. I am unable to do a review of systems on this patient due to his underlying dysarthria and respiratory failure requiring BiPAP  Assessment  and Plan: * Sepsis ([name]) -As evidenced by fever with a Tmax of 102.8, tachycardia, tachypnea, hypotension that responded to IV fluid resuscitation as well as lactic acidosis. -Source of sepsis appears to be secondary to aspiration pneumonia with associated acute respiratory failure with hypoxia requiring the need of BiPAP at time of admission.. -no BIPAP required Overnight -continue to demonstrate intermittent tachypnea -Patient with COVID infection. -Continue empiric antibiotic therapy with Unasyn for aspiration PNA. -Follow-up results of blood cultures -Continue to wean oxygen supplementation as tolerated; currently on 2-3 L high flow nasal cannula with good saturation demonstrated. -Continue bronchodilator management and supportive care. -currently afebrile and his WBC's WNL.   Aspiration pneumonia ([name]) -Secondary to known history of oropharyngeal dysphagia status post PEG tube insertion -continue Strict aspiration precaution -Continue current antibiotics for another 24 hours.. -continue adjusted rate tube feedings velocity and follow response.  Acute respiratory failure with hypoxia ([name]) -Patient presented to the ER in respiratory distress and was noted to to be tachypneic with increased work of breathing and use of accessory muscles. -at time of admission 86% oxygen saturation on room air; patient required to be placed on BiPAP -Continue current supportive care, bronchodilator management and IV antibiotic. -continue to wean off oxygen as tolerated; currently off BIPAP for almost 24 hours -still with intermittent tachypnea. -follow clinical response and continue CPT  Dysphagia, oropharyngeal -Patient with known history of oropharyngeal dysphagia admitted to the hospital with aspiration pneumonia. -continue Strict aspiration precautions -continue current tube feedings rate -continue prosource -follow response   COVID-19 virus infection -Patient tested positive for COVID-19  virus on 10/01/21 -Was started on molnupiravir as an outpatient for  3 days prior to admission. -Patient has completed treatment with remdesivir; continue steroids tapering and supportive care. -Continue to wean off oxygen supplementation as tolerated -Continue contact precaution -Continue bronchodilator management and the use of  antitussive/mucolytic regimen as required. -continue to Follow inflammatory markers (demonstarting adequate response and overall improvement). -continue chest physiotherapy to further assist with secretion management.  Hyperglycemia -Hyperglycemia in the setting of his steroids and tube feedings -Prior history of diabetes -Continue sliding scale insulin and follow CBGs fluctuation.  Chronic systolic CHF (congestive heart failure) (HCC) -Last known LVEF of 45 to 50% from a 2D echocardiogram which was done 08/27/21 -Stable/compensated currently -Continue to follow daily weights and strict I's and O's -Continue judicious fluid resuscitation in the setting of acute sepsis.   AKI (acute kidney injury) ([place]) -Patient has a baseline serum creatinine of 1.9 and creatinine peaked at 2.29 at time of admission. -Creatinine continue trending down.  Current creatinine level 1.97 -Worsening renal function appears to be secondary to prerenal azotemia, hypotension and continue use of nephrotoxic agents -Continue to avoid/minimize nephrotoxic agents -Maintain adequate hydration and the use of free water through PEG tube. -continue to Follow renal function trend.   Atrial fibrillation (HCC) -Continue Eliquis as primary prophylaxis for an acute stroke. -Rate controlled currently.  Severe protein calorie malnutrition -Chronically receiving nutrition through PEG tube. -Will resume tube feedings; trying to keep a slower rate to minimize chances of residual and aspiration. -Continue to follow clinical response. -continue to Follow CBGs and electrolytes and further replete as  needed..  Hypoglycemic event -In the setting of receiving insulin therapy while no eating or getting tube feedings. -Adequately corrected -No further episode of hypoglycemic event appreciated. -Continue to follow CBGs.  Hypokalemia/hypernatremia -Continue to replete as needed and follow electrolytes trend. -Maintaining adequate hydration from a sodium standpoint; continue to follow electrolytes trend. -potassium 4.3 and Na level 146 currently  Subjective:  Has reached 24 hours without the need for BIPAP; requiring 2-3L HF [place]; still intermittently tachypneic and demonstrating diffuse rhonchi.  Physical Exam: Vitals:   10/07/21 0839 10/07/21 0900 10/07/21 1000 10/07/21 1100  BP:  96/66 119/71 (!) 138/90  Pulse:  78    Resp:  (!) 30    Temp:      TempSrc:      SpO2: 96% 95% 93% 95%  Weight:      Height:       General exam: Alert, awake, oriented x 3, following commands. Frail weak and chronically ill. 2-3L HFNC in place.  Respiratory system: diffuse rhonchi bilaterally, no wheezing.  Cardiovascular system:Rate controlled, no rubs, no gallops.  Gastrointestinal system: Abdomen is nondistended, soft and nontender. No organomegaly or masses felt. Normal bowel sounds heard. Central nervous system: Alert and oriented. No focal neurological deficits. Extremities: No cyanosis, no clubbing.  Skin: No petechiae. Psychiatry: flat affect  Data Reviewed: CMET: sodium 146, potassium 4.3, chloride 106, bicarb 30, BUN 135, CR 1.97 and normal LFT's. CBC: WBC's 9.0, Hgb 14.3, Platelets 197K  Mg: 2.8  Family Communication: No family at bedside; daughter over the phon in details on 10/06/21.  Disposition: Status is: Inpatient Remains inpatient appropriate because: Still requiring treatment for acute respiratory failure with hypoxia in the setting of healthcare associated pneumonia/aspiration and COVID infection.   Planned Discharge Destination: Skilled nursing facility  Author: Barton Dubois, MD 10/07/2021 11:25 AM  For on call review [email or web address].

## 2021-10-08 DIAGNOSIS — J9601 Acute respiratory failure with hypoxia: Secondary | ICD-10-CM | POA: Diagnosis not present

## 2021-10-08 DIAGNOSIS — N179 Acute kidney failure, unspecified: Secondary | ICD-10-CM | POA: Diagnosis not present

## 2021-10-08 DIAGNOSIS — A419 Sepsis, unspecified organism: Secondary | ICD-10-CM | POA: Diagnosis not present

## 2021-10-08 DIAGNOSIS — U071 COVID-19: Secondary | ICD-10-CM | POA: Diagnosis not present

## 2021-10-08 LAB — CBC WITH DIFFERENTIAL/PLATELET
Abs Immature Granulocytes: 0.09 K/uL — ABNORMAL HIGH (ref 0.00–0.07)
Basophils Absolute: 0 K/uL (ref 0.0–0.1)
Basophils Relative: 1 %
Eosinophils Absolute: 0 K/uL (ref 0.0–0.5)
Eosinophils Relative: 0 %
HCT: 47.9 % (ref 39.0–52.0)
Hemoglobin: 15.4 g/dL (ref 13.0–17.0)
Immature Granulocytes: 1 %
Lymphocytes Relative: 9 %
Lymphs Abs: 0.7 K/uL (ref 0.7–4.0)
MCH: 30.7 pg (ref 26.0–34.0)
MCHC: 32.2 g/dL (ref 30.0–36.0)
MCV: 95.4 fL (ref 80.0–100.0)
Monocytes Absolute: 0.3 K/uL (ref 0.1–1.0)
Monocytes Relative: 4 %
Neutro Abs: 6.4 K/uL (ref 1.7–7.7)
Neutrophils Relative %: 85 %
Platelets: 144 K/uL — ABNORMAL LOW (ref 150–400)
RBC: 5.02 MIL/uL (ref 4.22–5.81)
RDW: 15.1 % (ref 11.5–15.5)
WBC: 7.5 K/uL (ref 4.0–10.5)
nRBC: 0 % (ref 0.0–0.2)

## 2021-10-08 LAB — COMPREHENSIVE METABOLIC PANEL WITH GFR
ALT: 19 U/L (ref 0–44)
AST: 23 U/L (ref 15–41)
Albumin: 2.1 g/dL — ABNORMAL LOW (ref 3.5–5.0)
Alkaline Phosphatase: 58 U/L (ref 38–126)
Anion gap: 10 (ref 5–15)
BUN: 119 mg/dL — ABNORMAL HIGH (ref 8–23)
CO2: 27 mmol/L (ref 22–32)
Calcium: 8.1 mg/dL — ABNORMAL LOW (ref 8.9–10.3)
Chloride: 113 mmol/L — ABNORMAL HIGH (ref 98–111)
Creatinine, Ser: 1.6 mg/dL — ABNORMAL HIGH (ref 0.61–1.24)
GFR, Estimated: 40 mL/min — ABNORMAL LOW
Glucose, Bld: 158 mg/dL — ABNORMAL HIGH (ref 70–99)
Potassium: 4.1 mmol/L (ref 3.5–5.1)
Sodium: 150 mmol/L — ABNORMAL HIGH (ref 135–145)
Total Bilirubin: 1 mg/dL (ref 0.3–1.2)
Total Protein: 5.5 g/dL — ABNORMAL LOW (ref 6.5–8.1)

## 2021-10-08 LAB — CULTURE, BLOOD (ROUTINE X 2)
Culture: NO GROWTH
Culture: NO GROWTH
Special Requests: ADEQUATE
Special Requests: ADEQUATE

## 2021-10-08 LAB — GLUCOSE, CAPILLARY
Glucose-Capillary: 148 mg/dL — ABNORMAL HIGH (ref 70–99)
Glucose-Capillary: 128 mg/dL — ABNORMAL HIGH (ref 70–99)
Glucose-Capillary: 148 mg/dL — ABNORMAL HIGH (ref 70–99)
Glucose-Capillary: 180 mg/dL — ABNORMAL HIGH (ref 70–99)
Glucose-Capillary: 188 mg/dL — ABNORMAL HIGH (ref 70–99)

## 2021-10-08 LAB — MAGNESIUM: Magnesium: 3.2 mg/dL — ABNORMAL HIGH (ref 1.7–2.4)

## 2021-10-08 MED ORDER — SCOPOLAMINE 1 MG/3DAYS TD PT72
1.0000 | MEDICATED_PATCH | TRANSDERMAL | Status: DC
  Administered 2021-10-08: 1.5 mg via TRANSDERMAL
  Filled 2021-10-08: qty 1

## 2021-10-08 MED ORDER — HALOPERIDOL LACTATE 5 MG/ML IJ SOLN: 0.5000 mg | Freq: Three times a day (TID) | INTRAMUSCULAR | Status: DC | PRN

## 2021-10-08 MED ORDER — HALOPERIDOL LACTATE 5 MG/ML IJ SOLN: 0.5000 mg | Freq: Four times a day (QID) | INTRAMUSCULAR | Status: DC | PRN

## 2021-10-08 MED ORDER — PREDNISONE 20 MG PO TABS
40.0000 mg | ORAL_TABLET | Freq: Every day | ORAL | Status: DC
  Administered 2021-10-09 – 2021-10-11 (×3): 40 mg via ORAL
  Filled 2021-10-08 (×3): qty 2

## 2021-10-08 NOTE — Progress Notes (Signed)
Chest PT not accomplished tonight.

## 2021-10-08 NOTE — Progress Notes (Signed)
Progress Note   Patient: Dominic Hodges [id number] DOB: January 07, 1928 DOA: 10/03/2021     5 DOS: the patient was seen and examined on 10/08/2021   Brief hospital course: As per H&P written by Dr. Francine Graven on 10/03/2021 Dominic Hodges is a 88 y.o. male with medical history significant for persistent atrial fibrillation, chronic systolic CHF, hypertension, history of oropharyngeal dysphagia status post PEG tube placement who was brought into the ER by EMS from [facility] where he resides in respiratory distress. Patient tested positive for COVID-19 viral infection on 10/01/21.  His wife is currently hospitalized for COVID-19 infection and so patient was tested and his results came back positive.  He was brought into the ER for worsening respiratory distress and upon arrival was noted to have a fever with a Tmax of 102.8, pulse oximetry on high flow oxygen of 86%, respiratory rate of 15, heart rate 109 and blood pressure 94/55. He was immediately placed on BiPAP in the ER with some improvement in his work of breathing.  Pulse oximetry on BiPAP is anywhere from 88 to 90%. ABG 7.52/48/58/39.2/89.5. Labs show lactic acid level of 3.0 Chest x-ray reviewed by me shows Similar heterogeneous airspace opacities in the left greater than right lung base suggestive of pneumonia. CT scan of the chest without contrast showed new areas of dense basilar consolidation in the lower lobes,endobronchial material and patchy areas of ground-glass and discrete nodularity discussed above, compatible with aspiration related pneumonia. 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 Mild cardiomegaly, aortic atherosclerosis and coronary artery disease. He received sepsis IV fluids in the ER as well as empiric antibiotic therapy with Flagyl, vancomycin and cefepime. I am unable to do a review of systems on this patient due to his underlying dysarthria and respiratory failure requiring BiPAP  Assessment  and Plan: * Sepsis ([place]) -As evidenced by fever with a Tmax of 102.8, tachycardia, tachypnea, hypotension that responded to IV fluid resuscitation as well as lactic acidosis. -Source of sepsis appears to be secondary to aspiration pneumonia with associated acute respiratory failure with hypoxia requiring the need of BiPAP at time of admission.. -no BIPAP for almost 48 hours. -continue to demonstrate intermittent tachypnea -Patient with COVID infection. -Continue empiric antibiotic therapy with Unasyn for aspiration PNA. -Follow-up results of blood cultures -Currently with good saturation on room air; still with intermittent episodes of tachypnea but otherwise improving and adequately progressing. -Continue bronchodilator management and supportive care. -currently afebrile and his WBC's WNL.   Aspiration pneumonia ([place]) -Secondary to known history of oropharyngeal dysphagia status post PEG tube insertion -continue Strict aspiration precaution -Transition antibiotics to Augmentin suspension on 10/09/2021. -continue adjusted rate tube feedings velocity and follow response.  Acute respiratory failure with hypoxia ([place]) -Patient presented to the ER in respiratory distress and was noted to to be tachypneic with increased work of breathing and use of accessory muscles. -at time of admission 86% oxygen saturation on room air; patient required to be placed on BiPAP -Continue current supportive care, bronchodilator management and IV antibiotic. -continue to wean off oxygen as tolerated; currently off BIPAP for almost 24 hours -still with intermittent tachypnea. -follow clinical response and continue CPT  Dysphagia, oropharyngeal -Patient with known history of oropharyngeal dysphagia admitted to the hospital with aspiration pneumonia. -continue Strict aspiration precautions -continue current tube feedings rate -continue prosource -follow response   COVID-19 virus infection -Patient tested  positive for COVID-19 virus on 10/01/21 -Was started on molnupiravir as an  outpatient for 3 days prior to admission. -Patient has completed treatment with remdesivir; continue steroids tapering and supportive care. -Continue to wean off oxygen supplementation as tolerated -Continue contact precaution -Continue bronchodilator management and the use of  antitussive/mucolytic regimen as required. -continue to Follow inflammatory markers (demonstarting adequate response and overall improvement). -continue chest physiotherapy to further assist with secretion management.  Hyperglycemia -Hyperglycemia in the setting of his steroids and tube feedings -Prior history of diabetes -Continue sliding scale insulin and follow CBGs fluctuation.  Chronic systolic CHF (congestive heart failure) (HCC) -Last known LVEF of 45 to 50% from a 2D echocardiogram which was done 08/27/21 -Stable/compensated currently -Continue to follow daily weights and strict I's and O's -Continue judicious fluid resuscitation in the setting of acute sepsis.   AKI (acute kidney injury) ([place]) -Patient has a baseline serum creatinine of 1.9 and creatinine peaked at 2.29 at time of admission. -Creatinine continue trending down.  Current creatinine level 1.97 -Worsening renal function appears to be secondary to prerenal azotemia, hypotension and continue use of nephrotoxic agents -Continue to avoid/minimize nephrotoxic agents -Maintain adequate hydration and the use of free water through PEG tube. -continue to Follow renal function trend.   Atrial fibrillation (HCC) -Continue Eliquis as primary prophylaxis for an acute stroke. -Rate controlled currently.  Severe protein calorie malnutrition -Chronically receiving nutrition through PEG tube. -Will resume tube feedings; trying to keep a slower rate to minimize chances of residual and aspiration. -Continue to follow clinical response. -continue to Follow CBGs and electrolytes  and further replete as needed..  Hypoglycemic event -In the setting of receiving insulin therapy while no eating or getting tube feedings. -Adequately corrected; No further episode of hypoglycemic event appreciated. -Continue to follow CBGs.  Hypokalemia/hypernatremia -Continue to replete as needed and follow electrolytes trend. -Continue to maintain adequate hydration from a sodium standpoint; continue to follow electrolytes trend. -potassium 4.1 and Na level 150 currently  Subjective:  Currently with good saturation on room air; no fever, no chest pain, no nausea or vomiting.  Overnight with episode of sundowning/disorientation.   Physical Exam: Vitals:   10/08/21 1313 10/08/21 1600 10/08/21 1700 10/08/21 1714  BP:  129/71 (!) 128/46   Pulse:   80   Resp:      Temp:  97.8 F (36.6 C)    TempSrc:  Axillary    SpO2: 92%  93% 93%  Weight:      Height:       General exam: Febrile, no chest pain, no nausea vomiting.  Slightly somnolent this morning after receiving Ativan overnight.  Patient with episodes of disorientation and sundowning. Respiratory system: Improved air movement bilaterally; still demonstrating rhonchorous sounds, positive Rales.  No wheezing or crackles. Cardiovascular system: rate controlled, no rubs, no gallops, no JVD. Gastrointestinal system: Abdomen is nondistended, soft and nontender. No organomegaly or masses felt. Normal bowel sounds heard. Central nervous system: No focal deficits appreciated. Extremities: No cyanosis or clubbing. Skin: No petechiae. Psychiatry: Limited examination given current lethargic state after medications overnight.  No suicidal ideation or hallucination.   Data Reviewed: CMET: Sodium 150, potassium 4.1, chloride 113, bicarb 27, BUN 119, creatinine 1.60 CBC: White blood cell 7.5, hemoglobin 15.4 and platelet count 144 K.  Family Communication: No family at bedside; daughter over the phon in details on  10/06/21.  Disposition: Status is: Inpatient Remains inpatient appropriate because: Still requiring treatment for acute respiratory failure with hypoxia in the setting of healthcare associated pneumonia/aspiration and COVID infection.   Planned Discharge  Destination: Skilled nursing facility  Author: Barton Dubois, MD 10/08/2021 5:18 PM  For on call review [email or web address].

## 2021-10-09 DIAGNOSIS — N179 Acute kidney failure, unspecified: Secondary | ICD-10-CM | POA: Diagnosis not present

## 2021-10-09 DIAGNOSIS — U071 COVID-19: Secondary | ICD-10-CM | POA: Diagnosis not present

## 2021-10-09 DIAGNOSIS — J9601 Acute respiratory failure with hypoxia: Secondary | ICD-10-CM | POA: Diagnosis not present

## 2021-10-09 DIAGNOSIS — A419 Sepsis, unspecified organism: Secondary | ICD-10-CM | POA: Diagnosis not present

## 2021-10-09 LAB — GLUCOSE, CAPILLARY
Glucose-Capillary: 110 mg/dL — ABNORMAL HIGH (ref 70–99)
Glucose-Capillary: 121 mg/dL — ABNORMAL HIGH (ref 70–99)
Glucose-Capillary: 150 mg/dL — ABNORMAL HIGH (ref 70–99)
Glucose-Capillary: 164 mg/dL — ABNORMAL HIGH (ref 70–99)
Glucose-Capillary: 190 mg/dL — ABNORMAL HIGH (ref 70–99)
Glucose-Capillary: 207 mg/dL — ABNORMAL HIGH (ref 70–99)
Glucose-Capillary: 214 mg/dL — ABNORMAL HIGH (ref 70–99)

## 2021-10-09 LAB — CREATININE, SERUM
Creatinine, Ser: 1.29 mg/dL — ABNORMAL HIGH (ref 0.61–1.24)
GFR, Estimated: 52 mL/min — ABNORMAL LOW (ref >60)

## 2021-10-09 MED ORDER — APIXABAN 5 MG PO TABS
5.0000 mg | ORAL_TABLET | Freq: Two times a day (BID) | ORAL | Status: DC
  Administered 2021-10-09 – 2021-10-11 (×4): 5 mg
  Filled 2021-10-09 (×4): qty 1

## 2021-10-09 MED ORDER — SODIUM CHLORIDE 0.9 % IV SOLN
3.0000 g | Freq: Three times a day (TID) | INTRAVENOUS | Status: DC
  Administered 2021-10-09: 3 g via INTRAVENOUS
  Filled 2021-10-09 (×4): qty 8

## 2021-10-09 MED ORDER — AMOXICILLIN-POT CLAVULANATE 600-42.9 MG/5ML PO SUSR
500.0000 mg | Freq: Two times a day (BID) | ORAL | Status: DC
  Administered 2021-10-09: 500 mg
  Filled 2021-10-09 (×9): qty 4.2

## 2021-10-09 NOTE — Progress Notes (Signed)
Progress Note   Patient: Dominic Hodges BPZ:[id number] DOB: 1927/12/14 DOA: 10/03/2021     6 DOS: the patient was seen and examined on 10/09/2021   Brief hospital course: As per H&P written by Dr. Francine Graven on 10/03/2021 Dominic Hodges is a 88 y.o. male with medical history significant for persistent atrial fibrillation, chronic systolic CHF, hypertension, history of oropharyngeal dysphagia status post PEG tube placement who was brought into the ER by EMS from [facility] where he resides in respiratory distress. Patient tested positive for COVID-19 viral infection on 10/01/21.  His wife is currently hospitalized for COVID-19 infection and so patient was tested and his results came back positive.  He was brought into the ER for worsening respiratory distress and upon arrival was noted to have a fever with a Tmax of 102.8, pulse oximetry on high flow oxygen of 86%, respiratory rate of 15, heart rate 109 and blood pressure 94/55. He was immediately placed on BiPAP in the ER with some improvement in his work of breathing.  Pulse oximetry on BiPAP is anywhere from 88 to 90%. ABG 7.52/48/58/39.2/89.5. Labs show lactic acid level of 3.0 Chest x-ray reviewed by me shows Similar heterogeneous airspace opacities in the left greater than right lung base suggestive of pneumonia. CT scan of the chest without contrast showed new areas of dense basilar consolidation in the lower lobes,endobronchial material and patchy areas of ground-glass and discrete nodularity discussed above, compatible with aspiration related pneumonia. 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 Mild cardiomegaly, aortic atherosclerosis and coronary artery disease. He received sepsis IV fluids in the ER as well as empiric antibiotic therapy with Flagyl, vancomycin and cefepime. I am unable to do a review of systems on this patient due to his underlying dysarthria and respiratory failure requiring BiPAP  Assessment  and Plan: * Sepsis ([place]) -As evidenced by fever with a Tmax of 102.8, tachycardia, tachypnea, hypotension that responded to IV fluid resuscitation as well as lactic acidosis. -Source of sepsis appears to be secondary to aspiration pneumonia with associated acute respiratory failure with hypoxia requiring the need of BiPAP at time of admission.. -no BIPAP for almost 48 hours. -continue to demonstrate intermittent tachypnea -Patient with COVID infection. -Continue empiric antibiotic therapy with Unasyn for aspiration PNA. -Follow-up results of blood cultures -Currently with good saturation on room air; still with intermittent episodes of tachypnea but otherwise improving and adequately progressing. -Continue bronchodilator management and supportive care. -currently afebrile and his WBC's WNL.   Aspiration pneumonia ([place]) -Secondary to known history of oropharyngeal dysphagia status post PEG tube insertion -continue Strict aspiration precaution -Transition antibiotics to Augmentin suspension on 10/09/2021. -continue adjusted rate tube feedings velocity and follow response.  Acute respiratory failure with hypoxia ([place]) -Patient presented to the ER in respiratory distress and was noted to to be tachypneic with increased work of breathing and use of accessory muscles. -at time of admission 86% oxygen saturation on room air; patient required to be placed on BiPAP -Continue current supportive care, bronchodilator management and IV antibiotic. -continue to wean off oxygen as tolerated; currently off BIPAP for almost 24 hours -still with intermittent tachypnea. -follow clinical response and continue CPT  Dysphagia, oropharyngeal -Patient with known history of oropharyngeal dysphagia admitted to the hospital with aspiration pneumonia. -continue Strict aspiration precautions -continue current tube feedings rate -continue prosource -follow response   COVID-19 virus infection -Patient tested  positive for COVID-19 virus on 10/01/21 -Was started on molnupiravir as an  outpatient for 3 days prior to admission. -Patient has completed treatment with remdesivir; continue steroids tapering and supportive care. -Continue to wean off oxygen supplementation as tolerated -Continue contact precaution -Continue bronchodilator management and the use of  antitussive/mucolytic regimen as required. -continue to Follow inflammatory markers (demonstarting adequate response and overall improvement). -continue chest physiotherapy to further assist with secretion management.  Hyperglycemia -Hyperglycemia in the setting of his steroids and tube feedings -Prior history of diabetes -Continue sliding scale insulin and follow CBGs fluctuation.  Chronic systolic CHF (congestive heart failure) (HCC) -Last known LVEF of 45 to 50% from a 2D echocardiogram which was done 08/27/21 -Stable/compensated currently -Continue to follow daily weights and strict I's and O's -Continue judicious fluid resuscitation in the setting of acute sepsis.   AKI (acute kidney injury) ([place]) -Patient has a baseline serum creatinine of 1.9 and creatinine peaked at 2.29 at time of admission. -Creatinine continue trending down.  Current creatinine level 1.97 -Worsening renal function appears to be secondary to prerenal azotemia, hypotension and continue use of nephrotoxic agents -Continue to avoid/minimize nephrotoxic agents -Maintain adequate hydration and the use of free water through PEG tube. -continue to Follow renal function trend.   Atrial fibrillation (HCC) -Continue Eliquis as primary prophylaxis for an acute stroke. -Rate controlled currently.  Severe protein calorie malnutrition -Chronically receiving nutrition through PEG tube. -Will resume tube feedings; trying to keep a slower rate to minimize chances of residual and aspiration. -Continue to follow clinical response. -continue to Follow CBGs and electrolytes  and further replete as needed..  Hypoglycemic event -In the setting of receiving insulin therapy while no eating or getting tube feedings. -Adequately corrected; No further episode of hypoglycemic event appreciated. -Continue to follow CBGs.  Hypokalemia/hypernatremia -Continue to replete as needed and follow electrolytes trend. -Continue to maintain adequate hydration from a sodium standpoint; continue to follow electrolytes trend. -Most recent potassium 4.1 and Na level 150 currently  Subjective:  No overnight events; no chest pain, no nausea or vomiting.  Continued to experience intermittent episodes of tachypnea.  Good saturation on room air appreciated.  Patient is afebrile.  Physical Exam: Vitals:   10/09/21 1400 10/09/21 1405 10/09/21 1500 10/09/21 1600  BP: 133/61     Pulse: (!) 54 79 71 88  Resp: (!) 42 14 (!) 25 (!) 30  Temp:      TempSrc:      SpO2: 97% 96% 95% 94%  Weight:      Height:       General exam: In no acute distress, good oxygen saturation on room air.  Demonstrating increased rhonchi bilaterally.  An intermittent episode of tachypnea. Respiratory system: Positive rhonchi, no crackles, intermittent episode of tachypnea appreciated.  Good saturation on room air. Cardiovascular system: No rubs, no gallops, no JVD on exam.  Rate controlled Gastrointestinal system: Abdomen is nondistended, soft and nontender. No organomegaly or masses felt. Normal bowel sounds heard. Central nervous system: No focal neurological deficits. Extremities: No cyanosis or clubbing. Skin: No petechiae. Psychiatry: Flat affect.  Data Reviewed: CMET: Sodium 150, potassium 4.1, chloride 113, bicarb 27, BUN 119, creatinine 1.60 CBC: White blood cell 7.5, hemoglobin 15.4 and platelet count 144 K.  Family Communication: No family at bedside; daughter over the phon in details on 10/06/21.  Disposition: Status is: Inpatient Remains inpatient appropriate because: Still requiring treatment  for acute respiratory failure with hypoxia in the setting of healthcare associated pneumonia/aspiration and COVID infection.   Planned Discharge Destination: Skilled nursing facility  Author:  Barton Dubois, MD 10/09/2021 5:48 PM  For on call review [email or web address].

## 2021-10-10 DIAGNOSIS — J9601 Acute respiratory failure with hypoxia: Secondary | ICD-10-CM | POA: Diagnosis not present

## 2021-10-10 DIAGNOSIS — A419 Sepsis, unspecified organism: Secondary | ICD-10-CM | POA: Diagnosis not present

## 2021-10-10 DIAGNOSIS — N179 Acute kidney failure, unspecified: Secondary | ICD-10-CM | POA: Diagnosis not present

## 2021-10-10 DIAGNOSIS — U071 COVID-19: Secondary | ICD-10-CM | POA: Diagnosis not present

## 2021-10-10 LAB — BASIC METABOLIC PANEL
Anion gap: 12 (ref 5–15)
BUN: 83 mg/dL — ABNORMAL HIGH (ref 8–23)
CO2: 27 mmol/L (ref 22–32)
Calcium: 8.4 mg/dL — ABNORMAL LOW (ref 8.9–10.3)
Chloride: 119 mmol/L — ABNORMAL HIGH (ref 98–111)
Creatinine, Ser: 1.1 mg/dL (ref 0.61–1.24)
GFR, Estimated: >60 mL/min (ref >60)
Glucose, Bld: 90 mg/dL (ref 70–99)
Potassium: 4.3 mmol/L (ref 3.5–5.1)
Sodium: 158 mmol/L — ABNORMAL HIGH (ref 135–145)

## 2021-10-10 LAB — CBC
HCT: 47.2 % (ref 39.0–52.0)
Hemoglobin: 14.5 g/dL (ref 13.0–17.0)
MCH: 30.5 pg (ref 26.0–34.0)
MCHC: 30.7 g/dL (ref 30.0–36.0)
MCV: 99.2 fL (ref 80.0–100.0)
Platelets: 206 10*3/uL (ref 150–400)
RBC: 4.76 MIL/uL (ref 4.22–5.81)
RDW: 15.4 % (ref 11.5–15.5)
WBC: 8.7 10*3/uL (ref 4.0–10.5)
nRBC: 0 % (ref 0.0–0.2)

## 2021-10-10 LAB — GLUCOSE, CAPILLARY
Glucose-Capillary: 114 mg/dL — ABNORMAL HIGH (ref 70–99)
Glucose-Capillary: 130 mg/dL — ABNORMAL HIGH (ref 70–99)
Glucose-Capillary: 157 mg/dL — ABNORMAL HIGH (ref 70–99)
Glucose-Capillary: 159 mg/dL — ABNORMAL HIGH (ref 70–99)
Glucose-Capillary: 189 mg/dL — ABNORMAL HIGH (ref 70–99)
Glucose-Capillary: 79 mg/dL (ref 70–99)
Glucose-Capillary: 94 mg/dL (ref 70–99)

## 2021-10-10 MED ORDER — FREE WATER
400.0000 mL | Freq: Four times a day (QID) | Status: DC
  Administered 2021-10-10 – 2021-10-11 (×4): 400 mL

## 2021-10-10 MED ORDER — DEXTROSE 5 % IV SOLN: INTRAVENOUS | Status: AC

## 2021-10-10 NOTE — Progress Notes (Signed)
Progress Note   Patient: Dominic Hodges FEO:[id number] DOB: [birth year] DOA: 10/03/2021     7 DOS: the patient was seen and examined on 10/10/2021   Brief hospital course: As per H&P written by Dr. Francine Graven on 10/03/2021 Dominic Hodges is a 88 y.o. male with medical history significant for persistent atrial fibrillation, chronic systolic CHF, hypertension, history of oropharyngeal dysphagia status post PEG tube placement who was brought into the ER by EMS from [facility] where he resides in respiratory distress. Patient tested positive for COVID-19 viral infection on 10/01/21.  His wife is currently hospitalized for COVID-19 infection and so patient was tested and his results came back positive.  He was brought into the ER for worsening respiratory distress and upon arrival was noted to have a fever with a Tmax of 102.8, pulse oximetry on high flow oxygen of 86%, respiratory rate of 15, heart rate 109 and blood pressure 94/55. He was immediately placed on BiPAP in the ER with some improvement in his work of breathing.  Pulse oximetry on BiPAP is anywhere from 88 to 90%. ABG 7.52/48/58/39.2/89.5. Labs show lactic acid level of 3.0 Chest x-ray reviewed by me shows Similar heterogeneous airspace opacities in the left greater than right lung base suggestive of pneumonia. CT scan of the chest without contrast showed new areas of dense basilar consolidation in the lower lobes,endobronchial material and patchy areas of ground-glass and discrete nodularity discussed above, compatible with aspiration related pneumonia. 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 Mild cardiomegaly, aortic atherosclerosis and coronary artery disease. He received sepsis IV fluids in the ER as well as empiric antibiotic therapy with Flagyl, vancomycin and cefepime. I am unable to do a review of systems on this patient due to his underlying dysarthria and respiratory failure requiring BiPAP  Assessment  and Plan: * Sepsis ([place]) -As evidenced by fever with a Tmax of 102.8, tachycardia, tachypnea, hypotension that responded to IV fluid resuscitation as well as lactic acidosis. -Source of sepsis appears to be secondary to aspiration pneumonia with associated acute respiratory failure with hypoxia requiring the need of BiPAP at time of admission.. -no BIPAP for almost over 72 hours. -continue to demonstrate intermittent tachypnea -Patient with COVID infection. -Continue empiric antibiotic therapy with Unasyn for aspiration PNA. -Follow-up results of blood cultures -Currently with good saturation on 1 L [place] to room air; still with intermittent episodes of tachypnea but otherwise improved and adequately progressing/stabilizing. -Continue bronchodilator management and supportive care. -currently has remained afebrile and his WBC's WNL.   Aspiration pneumonia ([place]) -Secondary to known history of oropharyngeal dysphagia status post PEG tube insertion -continue Strict aspiration precaution -Transition antibiotics to Augmentin suspension on 10/09/2021; so far well tolerated (planning a total of 10 days of antibiotics). -continue adjusted rate tube feedings velocity and follow response.  Acute respiratory failure with hypoxia ([place]) -Patient presented to the ER in respiratory distress and was noted to to be tachypneic with increased work of breathing and use of accessory muscles. -at time of admission 86% oxygen saturation on room air; patient required to be placed on BiPAP -Continue current supportive care, bronchodilator management and IV antibiotic. -continue to wean off oxygen as tolerated -still with intermittent tachypnea. -follow clinical response and continue CPT  Dysphagia, oropharyngeal -Patient with known history of oropharyngeal dysphagia admitted to the hospital with aspiration pneumonia. -continue Strict aspiration precautions -continue current tube feedings rate -continue  prosource -follow response   COVID-19 virus infection -Patient tested  positive for COVID-19 virus on 10/01/21 -Was started on molnupiravir as an outpatient for 3 days prior to admission. -Patient has completed treatment with remdesivir; continue slow steroids tapering and supportive care. -Continue to wean off oxygen supplementation as tolerated -Continue contact precaution -Continue bronchodilator management and the use of  antitussive/mucolytic regimen as required. -continue to Follow inflammatory markers (demonstarting adequate response and overall improvement). -continue chest physiotherapy to further assist with secretion management.  Hyperglycemia -Hyperglycemia in the setting of his steroids and tube feedings -Prior history of diabetes -Continue sliding scale insulin and follow CBGs fluctuation.  Chronic systolic CHF (congestive heart failure) (HCC) -Last known LVEF of 45 to 50% from a 2D echocardiogram which was done 08/27/21 -Stable/compensated currently -Continue to follow daily weights and strict I's and O's -Continue judicious fluid resuscitation in the setting of acute sepsis.   AKI (acute kidney injury) ([place]) -Patient has a baseline serum creatinine of 1.9 and creatinine peaked at 2.29 at time of admission. -Creatinine continue trending down.  Current creatinine level 1.10 -Worsening renal function appears to be secondary to prerenal azotemia, hypotension and continue use of nephrotoxic agents -Continue to avoid/minimize nephrotoxic agents -Maintain adequate hydration and the use of free water through PEG tube. -continue to Follow renal function trend.   Atrial fibrillation (HCC) -Continue Eliquis as primary prophylaxis for an acute stroke. -Rate controlled currently.  Severe protein calorie malnutrition -Chronically receiving nutrition through PEG tube. -Will resume tube feedings; trying to keep a slower rate to minimize chances of residual and  aspiration. -Continue to follow clinical response. -continue to Follow CBGs and electrolytes and further replete as needed..  Hypoglycemic event -In the setting of receiving insulin therapy while no eating or getting tube feedings. -Adequately corrected; No further episode of hypoglycemic event appreciated. -Continue to follow CBGs.  Hypokalemia/hypernatremia -Continue to replete as needed and follow electrolytes trend. -Continue to maintain adequate hydration from a sodium standpoint; continue to follow electrolytes trend. -Most recent potassium 4.3 and Na level 158 currently -Free water dose has been adjusted; D5W gentle fluid resuscitation will be provided overnight. -Repeat BMET in AM.  Subjective:  No overnight events; patient denies chest pain, nausea or vomiting.  Currently afebrile.  Continues to be significantly weak, deconditioned frail and chronically ill in appearance.  Expressing to be extremely thirsty and demanding to have some water.  1 L nasal cannula supplementation appreciated at time of examination.  Physical Exam: Vitals:   10/10/21 0000 10/10/21 0047 10/10/21 0152 10/10/21 1353  BP: 131/84  (!) 139/91 128/70  Pulse: 76  85 (!) 51  Resp: (!) 28  (!) 24   Temp:  98.2 F (36.8 C) 97.9 F (36.6 C) (!) 97.5 F (36.4 C)  TempSrc:  Oral Oral Oral  SpO2: 92%  94% 96%  Weight:      Height:       General exam: Alert, awake, oriented x 2; demonstrating good oxygen saturation on 1 L and expressing to be significantly thirsty.  No nausea, no vomiting, no chest pain.  Patient is afebrile. Respiratory system: Positive rhonchi bilaterally appreciated; somewhat improved in comparison to 10-23 examination.  No using accessory muscles.  Continued to experience intermittent episodes of tachypnea. Cardiovascular system: Rate controlled, no rubs, no gallops, no JVD. Gastrointestinal system: Abdomen is nondistended, soft and nontender. No organomegaly or masses felt. Normal bowel  sounds heard.  PEG tube in place. Central nervous system: Alert and oriented. No focal neurological deficits. Extremities: No cyanosis or clubbing. Skin: No petechiae.  Psychiatry: Flat affect.  Data Reviewed: CMET: Sodium 158, potassium 4.3, chloride 119, bicarb 27, BUN 83, creatinine 1.10 CBC: WBCs 8.7, hemoglobin 14.5 and platelet count 206K.  Family Communication: No family at bedside; daughter over the phon in details on 10/06/21.  Disposition: Status is: Inpatient Remains inpatient appropriate because: Still requiring treatment for acute respiratory failure with hypoxia in the setting of healthcare associated pneumonia/aspiration and COVID infection.   Planned Discharge Destination: Skilled nursing facility  Author: Barton Dubois, MD 10/10/2021 3:21 PM  For on call review [email or web address].

## 2021-10-10 NOTE — TOC Progression Note (Signed)
Transition of Care [facility]) - Progression Note    Patient Details  Name: Dominic Hodges MRN: [id number] Date of Birth: 12-Sep-1927  [facility] CM/SW Contact  Shade Flood, LCSW Phone Number: 10/10/2021, 3:57 PM  Clinical Narrative:     TOC following. Per MD, pt not yet stable for dc to [facility]. Updated Kerri at [facility] who states that they are all set to admit pt once MD indicates pt medically stable for dc.  Will follow.  Expected Discharge Plan: Skilled Nursing Facility Barriers to Discharge: Continued Medical Work up  Expected Discharge Plan and Services Expected Discharge Plan: [place] In-house Referral: Clinical Social Work, Hospice / [place] arrangements for the past 2 months: [place]                                       Social Determinants of Health (SDOH) Interventions    Readmission Risk Interventions    10/04/2021    3:16 PM 08/30/2021    4:00 PM  Readmission Risk Prevention Plan  Transportation Screening Complete Complete  PCP or Specialist Appt within 5-7 Days  Not Complete  Home Care Screening  Complete  Medication Review (RN CM)  Complete  HRI or Home Care Consult Complete   Social Work Consult for [place] Planning/Counseling Complete   Palliative Care Screening Complete   Medication Review Press photographer) Complete

## 2021-10-10 NOTE — Progress Notes (Signed)
Pt transferred to Rm 313, stable vitals, given report to [place], Therapist, sports. Tried to call daughter to let her know about the transfer to no avail.  Lear Ng, RN

## 2021-10-11 DIAGNOSIS — R1312 Dysphagia, oropharyngeal phase: Secondary | ICD-10-CM

## 2021-10-11 DIAGNOSIS — J9601 Acute respiratory failure with hypoxia: Secondary | ICD-10-CM | POA: Diagnosis not present

## 2021-10-11 DIAGNOSIS — N179 Acute kidney failure, unspecified: Secondary | ICD-10-CM | POA: Diagnosis not present

## 2021-10-11 DIAGNOSIS — U071 COVID-19: Secondary | ICD-10-CM | POA: Diagnosis not present

## 2021-10-11 DIAGNOSIS — A419 Sepsis, unspecified organism: Secondary | ICD-10-CM | POA: Diagnosis not present

## 2021-10-11 LAB — BASIC METABOLIC PANEL
Anion gap: 9 (ref 5–15)
BUN: 77 mg/dL — ABNORMAL HIGH (ref 8–23)
CO2: 26 mmol/L (ref 22–32)
Calcium: 7.9 mg/dL — ABNORMAL LOW (ref 8.9–10.3)
Chloride: 118 mmol/L — ABNORMAL HIGH (ref 98–111)
Creatinine, Ser: 1.06 mg/dL (ref 0.61–1.24)
GFR, Estimated: >60 mL/min (ref >60)
Glucose, Bld: 81 mg/dL (ref 70–99)
Potassium: 4.6 mmol/L (ref 3.5–5.1)
Sodium: 153 mmol/L — ABNORMAL HIGH (ref 135–145)

## 2021-10-11 LAB — GLUCOSE, CAPILLARY
Glucose-Capillary: 74 mg/dL (ref 70–99)
Glucose-Capillary: 74 mg/dL (ref 70–99)
Glucose-Capillary: 116 mg/dL — ABNORMAL HIGH (ref 70–99)

## 2021-10-11 MED ORDER — SCOPOLAMINE 1 MG/3DAYS TD PT72: 1.0000 | MEDICATED_PATCH | TRANSDERMAL | 12 refills | Status: AC

## 2021-10-11 MED ORDER — ZINC SULFATE 220 (50 ZN) MG PO CAPS: 220.0000 mg | ORAL_CAPSULE | Freq: Every day | ORAL | 0 refills | Status: DC

## 2021-10-11 MED ORDER — FREE WATER: 400.0000 mL | 2 refills | Status: AC

## 2021-10-11 MED ORDER — PROSOURCE TF20 ENFIT COMPATIBL EN LIQD: 60.0000 mL | Freq: Two times a day (BID) | ENTERAL | 1 refills | Status: DC

## 2021-10-11 MED ORDER — ALBUTEROL SULFATE (2.5 MG/3ML) 0.083% IN NEBU: 2.5000 mg | INHALATION_SOLUTION | Freq: Four times a day (QID) | RESPIRATORY_TRACT | 12 refills | Status: AC | PRN

## 2021-10-11 MED ORDER — OSMOLITE 1.5 CAL PO LIQD: 30.0000 mL | ORAL | 4 refills | Status: AC

## 2021-10-11 MED ORDER — AMOXICILLIN-POT CLAVULANATE 600-42.9 MG/5ML PO SUSR: 500.0000 mg | Freq: Two times a day (BID) | ORAL | 0 refills | Status: AC

## 2021-10-11 MED ORDER — INSULIN DETEMIR 100 UNIT/ML ~~LOC~~ SOLN: 7.0000 [IU] | Freq: Every day | SUBCUTANEOUS | 11 refills | Status: DC

## 2021-10-11 NOTE — Progress Notes (Signed)
Patient stable and ready for discharge to [facility] room 122. IV removed. Writer called report and spoke with Ola, LPN to give report and she verbalized understanding and had no questions. Patient discharged with foley catheter in place. Discharge paperwork sent with patient. Patient is to follow with hospice at [facility].

## 2021-10-11 NOTE — Care Management Important Message (Signed)
Important Message  Patient Details  Name: Dominic Hodges MRN: [id number] Date of Birth: 16-Jul-1927   Medicare Important Message Given:  Yes     [name] 10/11/2021, 11:56 AM

## 2021-10-11 NOTE — Plan of Care (Signed)
  Problem: Coping: Goal: Psychosocial and spiritual needs will be supported Outcome: Progressing   

## 2021-10-11 NOTE — Discharge Summary (Signed)
Dominic Hodges, is a 88 y.o. male  DOB 12-Oct-1927  MRN [id number].  Admission date:  10/03/2021  Admitting Physician  Collier Bullock, MD  Discharge Date:  10/11/2021   Primary MD  Celene Squibb, MD  Recommendations for primary care physician for things to follow:   -1)Continue Osmolite 1.5 tube feeding at 30 mL an hour 2)Please give water flushes via PEG tube at 400 ml every 4 hours for the next 48 to 72 hours, please repeat BMP on Friday, 10/13/2021 and adjust PEG tube water flushes based on sodium levels 3)Aspiration precautions while on tube feeding 4)) ice chips and sips advised 5) repeat BMP blood test strongly advised on Friday, 10/13/2021  Admission Diagnosis  Acute respiratory distress [R06.03] Acute kidney injury ([place]) [N17.9] Sepsis ([place]) [A41.9] Sepsis, due to unspecified organism, unspecified whether acute organ dysfunction present ([place]) [A41.9] COVID [U07.1]  Discharge Diagnosis  Acute respiratory distress [R06.03] Acute kidney injury ([place]) [N17.9] Sepsis ([place]) [A41.9] Sepsis, due to unspecified organism, unspecified whether acute organ dysfunction present ([place]) [A41.9] COVID [U07.1]    Principal Problem:   Sepsis ([place]) Active Problems:   Aspiration pneumonia ([place])   Dysphagia, oropharyngeal   Acute respiratory failure with hypoxia ([place])   COVID-19 virus infection   Atrial fibrillation ([place])   AKI (acute kidney injury) ([place])   Chronic systolic CHF (congestive heart failure) ([place])   Hyperglycemia      Past Medical History:  Diagnosis Date   Acid reflux    mild-states if doesnt chew food completely he begins to cough- states has weakness in muscles due to ilieus- has been doing exreci this   Adynamic ileus ([place]) 12/2014   ? anesthesia related per pt   BPH (benign prostatic hyperplasia)    Complication of anesthesia    10/11/15- per pt "had [place]- in 11/16-related it to possibly anesthesia"   H/O seasonal allergies    Heart failure with mid-range ejection fraction (HFmEF) (HCC)    Hyperlipidemia    Hypertension    Hyponatremia    Persistent atrial fibrillation [facility])     Past Surgical History:  Procedure Laterality Date   APPENDECTOMY     approx 30 years ago/lwb   CATARACT EXTRACTION W/PHACO Left 09/11/2012   Procedure: CATARACT EXTRACTION PHACO AND INTRAOCULAR LENS PLACEMENT ([place]);  Surgeon: Tonny Branch, MD;  Location: [facility];  Service: Ophthalmology;  Laterality: Left;  CDE: 19.52   ESOPHAGOGASTRODUODENOSCOPY (EGD) WITH PROPOFOL N/A 05/03/2020   Procedure: ESOPHAGOGASTRODUODENOSCOPY (EGD) WITH PROPOFOL;  Surgeon: Aviva Signs, MD;  Location: [facility];  Service: General;  Laterality: N/A;   HIP ARTHROPLASTY Right 12/06/2014   Procedure: PARTIAL RIGHT HIP REPLACEMENT;  Surgeon: Carole Civil, MD;  Location: [facility];  Service: Orthopedics;  Laterality: Right;   HIP ARTHROPLASTY Left 03/08/2020   Procedure: ARTHROPLASTY BIPOLAR HIP (HEMIARTHROPLASTY);  Surgeon: Mordecai Rasmussen, MD;  Location: [facility];  Service: Orthopedics;  Laterality: Left;   IR REPLC GASTRO/COLONIC TUBE PERCUT W/FLUORO  09/20/2021  LUMBAR LAMINECTOMY/DECOMPRESSION MICRODISCECTOMY N/A 10/19/2015   Procedure: CENTRAL DECOMPRESSION LUMBAR LAMINECTOMY FOR SPINAL STENOSIS, L3,L4FORAMINOTOMY BILATERAL L3,L4 NERVE ROOT;  Surgeon: Latanya Maudlin, MD;  Location: [facility];  Service: Orthopedics;  Laterality: N/A;   PEG PLACEMENT N/A 05/03/2020   Procedure: PERCUTANEOUS ENDOSCOPIC GASTROSTOMY (PEG) PLACEMENT;  Surgeon: Aviva Signs, MD;  Location: [facility];  Service: General;  Laterality: N/A;   UMBILICAL HERNIA REPAIR     Approx 10 years ago/lwb   ureteral stone extraction      HPI  from the history and physical done on the day of admission:     HPI: Dominic Hodges is a 88 y.o. male with medical history significant for persistent atrial fibrillation, chronic  systolic CHF, hypertension, history of oropharyngeal dysphagia status post PEG tube placement who was brought into the ER by EMS from [facility] where he resides in respiratory distress. Patient tested positive for COVID-19 viral infection on 10/01/21.  His wife is currently hospitalized for COVID-19 infection and so patient was tested and his results came back positive.  He was brought into the ER for worsening respiratory distress and upon arrival was noted to have a fever with a Tmax of 102.8, pulse oximetry on high flow oxygen of 86%, respiratory rate of 15, heart rate 109 and blood pressure 94/55. He was immediately placed on BiPAP in the ER with some improvement in his work of breathing.  Pulse oximetry on BiPAP is anywhere from 88 to 90%. ABG 7.52/48/58/39.2/89.5. Labs show lactic acid level of 3.0 Chest x-ray reviewed by me shows Similar heterogeneous airspace opacities in the left greater than right lung base suggestive of pneumonia. CT scan of the chest without contrast showed new areas of dense basilar consolidation in the lower lobes,endobronchial material and patchy areas of ground-glass and discrete nodularity discussed above, compatible with aspiration related pneumonia. 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 Mild cardiomegaly, aortic atherosclerosis and coronary artery disease. He received sepsis IV fluids in the ER as well as empiric antibiotic therapy with Flagyl, vancomycin and cefepime. I am unable to do a review of systems on this patient due to his underlying dysarthria and respiratory failure requiring BiPAP     Review of Systems: unable to review all systems due to the inability of the patient to answer questions.   Hospital Course:    Assessment and Plan: 1)Sepsis secondary to aspiration pneumonia-- POA -met sepsis criteria with fevers, tachycardia and tachypnea lactic acidosis and hypotension -Treated with IV Unasyn switched to Augmentin via  PEG tube -Bronchodilators as ordered - 2) acute hypoxic respiratory failure-due to #1 above and also due to COVID infection -Initially required BiPAP -Weaned off BiPAP -weaned off oxygen - 3)Aspiration pneumonia ([place]) -Secondary to known history of oropharyngeal dysphagia status post PEG tube insertion -continue Strict aspiration precaution -Antibiotics as above #1  4)Dysphagia, oropharyngeal/status post PEG placement---tube feeding -Patient with known history of oropharyngeal dysphagia admitted to the hospital with aspiration pneumonia. -continue prosource placement - Continue Osmolite 1.5 tube feeding at 30 mL an hour - give water flushes via PEG tube at 400 ml every 4 hours for the next 48 to 72 hours, please repeat BMP on Friday, 10/13/2021 and adjust PEG tube water flushes based on sodium levels Aspiration precautions while on tube feeding - ice chips and sips advised  5) hydration/free water deficit/hypernatremia and hyperchloremia -- Increase  water flushes via PEG tube at 400 ml every 4 hours for the next 48 to 72  hours, please repeat BMP on Friday, 10/13/2021 and adjust PEG tube water flushes based on sodium levels -Sodium is trending down from 1 58-1 53 -repeat BMP blood test strongly advised on Friday, 10/13/2021  6)COVID-19 virus infection -Patient tested positive for COVID-19 virus on 10/01/21 -Received molnupiravir as an outpatient for 3 days prior to admission. -Patient has completed treatment with remdesivir;  continue slow steroids tapering and supportive care. -Treated with steroids -Continue mucolytics and bronchodilators -Respiratory status has improved clinically Patient has been weaned off oxygen   7)DM2-  -No recent A1c  -Continue insulin regimen while on feeding tube  -Glucose control should improve off steroids  Use Novolog/Humalog Sliding scale insulin with Accu-Cheks/Fingersticks as ordered    8)Chronic systolic CHF (congestive heart failure)  (HCC) -Last known LVEF of 45 to 50% from a 2D echocardiogram which was done 08/27/21 -Currently appears dehydrated/volume depleted -Water flushes via PEG as above   9)AKI (acute kidney injury) ([place]) -Patient has a baseline serum creatinine of 1.9 and creatinine peaked at 2.29 at time of admission. -Due to dehydration and volume depletion -BUN is down to 77 from 135 -Creatinine is down to 1.0 from 2.20    10)Atrial fibrillation (HCC) -Continue Eliquis as primary prophylaxis for an acute stroke. -Rate controlled currently.   11)Severe protein calorie malnutrition -Chronically receiving nutrition through PEG tube. --Continue Osmolite tube feed and Prosource protein supplement as above  12) social/ethics--- DNR/DNI with multiple comorbidities -Overall prognosis is guarded   Discharge Condition: Stable  Diet and Activity recommendation:  As advised  Discharge Instructions    Discharge Instructions     Call MD for:  difficulty breathing, headache or visual disturbances   Complete by: As directed    Call MD for:  persistant dizziness or light-headedness   Complete by: As directed    Call MD for:  persistant nausea and vomiting   Complete by: As directed    Call MD for:  temperature >100.4   Complete by: As directed    Diet clear liquid   Complete by: As directed    1)continue Osmolite 1.5 tube feeding at 30 mL an hour 2)Please give water flushes via PEG tube at 400 ml every 4 hours for the next 48 to 72 hours, please repeat BMP on Friday, 10/13/2021 and adjust PEG tube water flushes based on sodium levels 3) ice chips and sips advised   Discharge instructions   Complete by: As directed    1)Continue Osmolite 1.5 tube feeding at 30 mL an hour 2)Please give water flushes via PEG tube at 400 ml every 4 hours for the next 48 to 72 hours, please repeat BMP on Friday, 10/13/2021 and adjust PEG tube water flushes based on sodium levels 3)Aspiration precautions while on tube  feeding 4)) ice chips and sips advised 5) repeat BMP blood test strongly advised on Friday, 10/13/2021   Increase activity slowly   Complete by: As directed        Discharge Medications     Allergies as of 10/11/2021       Reactions   Robaxin [methocarbamol] Other (See Comments)   Mental confusion        Medication List     STOP taking these medications    ergocalciferol 1.25 MG (50000 UT) capsule Commonly known as: VITAMIN D2   molnupiravir EUA 200 mg Caps capsule Commonly known as: LAGEVRIO   potassium chloride SA 20 MEQ tablet Commonly known as: KLOR-CON M   predniSONE 20 MG tablet Commonly  known as: DELTASONE   torsemide 20 MG tablet Commonly known as: DEMADEX   zinc gluconate 50 MG tablet       TAKE these medications    acetaminophen 325 MG tablet Commonly known as: TYLENOL Take 650 mg by mouth every 6 (six) hours as needed.   albuterol (2.5 MG/3ML) 0.083% nebulizer solution Commonly known as: PROVENTIL Take 3 mLs (2.5 mg total) by nebulization every 6 (six) hours as needed for wheezing or shortness of breath.   amoxicillin-clavulanate 600-42.9 MG/5ML suspension Commonly known as: AUGMENTIN Place 4.2 mLs (500 mg total) into feeding tube every 12 (twelve) hours for 5 days.   apixaban 5 MG Tabs tablet Commonly known as: ELIQUIS Place 1 tablet (5 mg total) into feeding tube 2 (two) times daily.   ascorbic acid 500 MG tablet Commonly known as: VITAMIN C Place 1 tablet (500 mg total) into feeding tube 2 (two) times daily for 13 days.   feeding supplement (OSMOLITE 1.5 CAL) Liqd Place 30 mLs into feeding tube continuous. Every shift; Day, Evening, and Night. What changed: how much to take   feeding supplement (PROSource TF20) liquid Place 60 mLs into feeding tube 2 (two) times daily.   fluorometholone 0.1 % ophthalmic suspension Commonly known as: EFLONE Place 1 drop into both eyes 2 (two) times daily.   fluorometholone 0.1 % ophthalmic  suspension Commonly known as: FML Place 1 drop into both eyes 2 (two) times daily.   free water Soln Place 400 mLs into feeding tube every 4 (four) hours.   guaiFENesin 100 MG/5ML liquid Commonly known as: ROBITUSSIN Place 10 mLs into feeding tube in the morning, at noon, and at bedtime for 11 days.   insulin detemir 100 UNIT/ML injection Commonly known as: LEVEMIR Inject 0.07 mLs (7 Units total) into the skin at bedtime.   scopolamine 1 MG/3DAYS Commonly known as: TRANSDERM-SCOP Place 1 patch (1.5 mg total) onto the skin every 3 (three) days.   zinc sulfate 220 (50 Zn) MG capsule Place 1 capsule (220 mg total) into feeding tube daily.       Major procedures and Radiology Reports - PLEASE review detailed and final reports for all details, in brief -   CT Chest Wo Contrast  Result Date: 10/03/2021 CLINICAL DATA:  A 88 year old male presents with suspected pneumonia. Recent diagnosis of COVID infection now with worsening shortness of breath. EXAM: CT CHEST WITHOUT CONTRAST TECHNIQUE: Multidetector CT imaging of the chest was performed following the standard protocol without IV contrast. RADIATION DOSE REDUCTION: This exam was performed according to the departmental dose-optimization program which includes automated exposure control, adjustment of the mA and/or kV according to patient size and/or use of iterative reconstruction technique. COMPARISON:  October 03, 2021 radiograph and September 15, 2021 CT of the abdomen and pelvis. FINDINGS: Cardiovascular: Calcified aortic atherosclerotic changes. 4.1 cm ascending thoracic aortic caliber. Normal caliber of central pulmonary vessels. Heart size moderately enlarged. No pericardial effusion or thickening. Three-vessel coronary artery disease. Mediastinum/Nodes: No thoracic inlet, axillary, mediastinal or hilar adenopathy. Esophagus grossly normal. Lungs/Pleura: Patchy bilateral parenchymal opacities within the dependent aspects of area of  portions of the RIGHT upper lobe. Discrete nodules present in the RIGHT upper lobe largest 6 mm (image 35/4) dense basilar consolidative changes with material throughout the lower lobe bronchi greatest on the RIGHT. Basilar consolidative changes are new as compared to imaging from September 15, 2021 CT of the abdomen and pelvis. Upper Abdomen: Imaged portions the liver, gallbladder, pancreas, spleen, adrenal glands and  gastrointestinal tract without acute process on limited assessment. Musculoskeletal: No acute musculoskeletal findings. Spinal degenerative changes. IMPRESSION: 1. New areas of dense basilar consolidation in the lower lobes, endobronchial material and patchy areas of ground-glass and discrete nodularity discussed above, compatible with aspiration related pneumonia. 2. 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 3. Mild cardiomegaly, aortic atherosclerosis and coronary artery disease. Aortic Atherosclerosis (ICD10-I70.0). Electronically Signed   By: Zetta Bills M.D.   On: 10/03/2021 17:20   DG Chest Port 1 View  Result Date: 10/03/2021 CLINICAL DATA:  Shortness of breath, COVID EXAM: PORTABLE CHEST 1 VIEW COMPARISON:  08/29/2021, 10/27/2020 FINDINGS: Heterogeneous airspace opacity at the left greater than right lung base. The heart is slightly enlarged. Aortic atherosclerosis. No pleural effusion or pneumothorax. IMPRESSION: Similar heterogeneous airspace opacities in the left greater than right lung base suggestive of pneumonia. Electronically Signed   By: Donavan Foil M.D.   On: 10/03/2021 15:21   IR Replc Gastro/Colonic Tube Percut W/Fluoro  Result Date: 09/20/2021 INDICATION: Inadvertent removal of gastrostomy tube. Gastrostomy tube track is maintained with a 20 French Foley catheter. Please perform fluoroscopic guided conversion to a balloon retention gastrostomy tube. EXAM: FLUOROSCOPIC GUIDED REPLACEMENT OF GASTROSTOMY TUBE COMPARISON:  CT abdomen  pelvis-09/15/2021 MEDICATIONS: None. CONTRAST:  27m OMNIPAQUE IOHEXOL 300 MG/ML SOLN - administered into the gastric lumen FLUOROSCOPY TIME:  6 seconds (2 mGy) COMPLICATIONS: None immediate. PROCEDURE: A timeout was performed prior to the initiation of the procedure. The existing Foley catheter, utilized to maintain patency of the gastrostomy tube track was removed intact. A 20 French balloon retention gastrostomy tube was inserted via the gastrostomy tube track. The retention balloon was insufflated with a small amount of saline and dilute contrast. The external disc was cinched. Contrast was injected and a post procedural spot fluoroscopic image was obtained confirming appropriate positioning and functionality of the new gastrostomy tube. A dressing was applied. The patient tolerated the procedure well without immediate postprocedural complication. IMPRESSION: Successful fluoroscopic guided replacement of a new 20-French gastrostomy tube. The gastrostomy tube is ready for immediate use. Electronically Signed   By: JSandi MariscalM.D.   On: 09/20/2021 16:38   DG ABDOMEN PEG TUBE LOCATION  Result Date: 09/15/2021 CLINICAL DATA:  Peg tube placement EXAM: ABDOMEN - 1 VIEW COMPARISON:  CT 09/15/2021 FINDINGS: 30 mL Gastrografin injected in the patient's gastrostomy, a single overhead images obtained. Contrast injection opacifies the duodenum. No significant contrast in the stomach. Mild diffuse air distension of the bowel. IMPRESSION: Gastrostomy tube projects over the proximal duodenum with contrast only opacifying the duodenal C loop. Electronically Signed   By: KDonavan FoilM.D.   On: 09/15/2021 19:15   CT ABDOMEN PELVIS WO CONTRAST  Result Date: 09/15/2021 CLINICAL DATA:  Abdominal pain EXAM: CT ABDOMEN AND PELVIS WITHOUT CONTRAST TECHNIQUE: Multidetector CT imaging of the abdomen and pelvis was performed following the standard protocol without IV contrast. RADIATION DOSE REDUCTION: This exam was performed  according to the departmental dose-optimization program which includes automated exposure control, adjustment of the mA and/or kV according to patient size and/or use of iterative reconstruction technique. COMPARISON:  08/31/2021 FINDINGS: Lower chest: There are small linear patchy densities in both lower lung fields. There is mild ectasia of bronchi in the lower lung fields. Coronary artery calcifications are seen. Hepatobiliary: There are few small low-density lesions in liver with no significant interval change, possibly cysts or hemangiomas. There is no dilation of bile ducts. Gallbladder is unremarkable. Pancreas: There  is a 1.5 cm smooth marginated nodule in the region of uncinate process of head of the pancreas. This finding has not changed. Pancreas is otherwise unremarkable. Spleen: Unremarkable. Adrenals/Urinary Tract: Adrenals are unremarkable. There is no hydronephrosis. Parapelvic cysts are seen largest in the lower pole of right kidney measuring 3.9 cm. There is 12 mm smooth marginated high density structure in the posteromedial midportion of right kidney. There is peripheral rim of calcifications in is cyst in the lower pole of left kidney. Ureters are not dilated. Urinary bladder is not distended. Stomach/Bowel: Small hiatal hernia is seen. Tip of gastrostomy tube appears to be within the body of the uterus in left upper abdomen. There is no abnormal fluid collection or pockets of air adjacent to the gastrostomy tube. Small bowel loops are not dilated. Appendix is not seen. Scattered diverticula are seen in colon. Sigmoid colon is less distended. There is no significant wall thickening and colon. There is no pericolic fluid collection. Vascular/Lymphatic: Scattered arterial calcifications are seen. Reproductive: There are small calcifications in prostate. Other: There is no ascites or pneumoperitoneum. A large left inguinal hernia containing fat. Musculoskeletal: There is decrease in height of upper  endplates of bodies of E99 and T12 vertebrae with no significant change. There are pockets of air in T11-T12 disc space. Degenerative changes are noted in lower lumbar spine with encroachment of neural foramina at multiple levels. Overall, no significant interval changes are noted. There is previous arthroplasty in both hips. IMPRESSION: There is no evidence of intestinal obstruction or pneumoperitoneum. There is no hydronephrosis. Coronary artery disease. Diverticulosis of colon without signs of diverticulitis. Tip of gastrostomy tube is noted in the lumen of body of stomach. Small hiatal hernia is seen. There is 1.5 cm smooth marginated fluid density structure in the region of uncinate process of head of the pancreas with no interval change. This may suggest a benign cyst or neoplastic process. Follow-up CT in 1 year may be considered if appropriate for the patient's age and clinical condition. Decrease in height of the bodies of T11 and T12 vertebrae has not changed. There are multiple cysts in both kidneys with no significant interval change. There are small linear densities in the lower lung fields suggesting scarring or subsegmental atelectasis. Other findings as described in the body of the report. Electronically Signed   By: Elmer Picker M.D.   On: 09/15/2021 18:23   DG Abd 1 View  Result Date: 09/13/2021 CLINICAL DATA:  Ileus follow-up. EXAM: ABDOMEN - 1 VIEW COMPARISON:  Abdomen 09/07/2021.  CT 08/31/2021. FINDINGS: Gastrostomy tube noted in stable position. Persistent distention of the sigmoid colon again noted. Colonic gas pattern otherwise unremarkable. No small bowel distention noted. No free air. Lumbar spine scoliosis concave right. Diffuse osteopenia degenerative change lumbar spine. Postsurgical changes both hips again noted. No acute bony abnormality. Left base subsegmental atelectasis and or infiltrate. IMPRESSION: 1. Persistent distention of the sigmoid colon again noted. No  significant interim change. 2.  Gastrostomy tube in stable position. 3.  Left base subsegmental atelectasis and or infiltrate. Electronically Signed   By: Marcello Moores  Register M.D.   On: 09/13/2021 07:15    Micro Results   Recent Results (from the past 240 hour(s))  Culture, blood (Routine X 2) w Reflex to ID Panel     Status: None   Collection Time: 10/03/21  3:16 PM   Specimen: BLOOD LEFT HAND  Result Value Ref Range Status   Specimen Description BLOOD LEFT HAND  Final  Special Requests   Final    BOTTLES DRAWN AEROBIC ONLY Blood Culture adequate volume   Culture   Final    NO GROWTH 5 DAYS Performed at [facility], [address] [place], [place] [address]    Report Status 10/08/2021 FINAL  Final  Culture, blood (Routine X 2) w Reflex to ID Panel     Status: None   Collection Time: 10/03/21  3:16 PM   Specimen: BLOOD RIGHT HAND  Result Value Ref Range Status   Specimen Description BLOOD RIGHT HAND  Final   Special Requests   Final    BOTTLES DRAWN AEROBIC AND ANAEROBIC Blood Culture adequate volume   Culture   Final    NO GROWTH 5 DAYS Performed at [facility], [address] [place], [place] [address]    Report Status 10/08/2021 FINAL  Final  Resp Panel by RT-PCR (Flu A&B, Covid) Urine, Catheterized     Status: Abnormal   Collection Time: 10/03/21  3:58 PM   Specimen: Urine, Catheterized; Nasal Swab  Result Value Ref Range Status   SARS Coronavirus 2 by RT PCR POSITIVE (A) NEGATIVE Final    Comment: (NOTE) SARS-CoV-2 target nucleic acids are DETECTED.  The SARS-CoV-2 RNA is generally detectable in upper respiratory specimens during the acute phase of infection. Positive results are indicative of the presence of the identified virus, but do not rule out bacterial infection or co-infection with other pathogens not detected by the test. Clinical correlation with patient history and other diagnostic information is necessary to determine patient infection status. The expected  result is Negative.  Fact Sheet for Patients: EntrepreneurPulse.com.au  Fact Sheet for Healthcare Providers: IncredibleEmployment.be  This test is not yet approved or cleared by the [place] FDA and  has been authorized for detection and/or diagnosis of SARS-CoV-2 by FDA under an Emergency Use Authorization (EUA).  This EUA will remain in effect (meaning this test can be used) for the duration of  the COVID-19 declaration under Section 564(b)(1) of the A ct, 21 U.S.C. section 360bbb-3(b)(1), unless the authorization is terminated or revoked sooner.     Influenza A by PCR NEGATIVE NEGATIVE Final   Influenza B by PCR NEGATIVE NEGATIVE Final    Comment: (NOTE) The Xpert Xpress SARS-CoV-2/FLU/RSV plus assay is intended as an aid in the diagnosis of influenza from Nasopharyngeal swab specimens and should not be used as a sole basis for treatment. Nasal washings and aspirates are unacceptable for Xpert Xpress SARS-CoV-2/FLU/RSV testing.  Fact Sheet for Patients: EntrepreneurPulse.com.au  Fact Sheet for Healthcare Providers: IncredibleEmployment.be  This test is not yet approved or cleared by the [place] FDA and has been authorized for detection and/or diagnosis of SARS-CoV-2 by FDA under an Emergency Use Authorization (EUA). This EUA will remain in effect (meaning this test can be used) for the duration of the COVID-19 declaration under Section 564(b)(1) of the Act, 21 U.S.C. section 360bbb-3(b)(1), unless the authorization is terminated or revoked.  Performed at [facility], [address] [place], [place] [address]   Urine Culture     Status: None   Collection Time: 10/03/21  4:03 PM   Specimen: Urine, Catheterized  Result Value Ref Range Status   Specimen Description   Final    URINE, CATHETERIZED Performed at [facility], [address] [place], [place] [address]    Special Requests    Final    NONE Performed at [facility], [address], [place],  [address]    Culture  Final    NO GROWTH Performed at [facility], [place] [address] [place], [place] [address]    Report Status 10/04/2021 FINAL  Final  MRSA Next Gen by PCR, Nasal     Status: None   Collection Time: 10/03/21  9:04 PM   Specimen: Nasal Mucosa; Nasal Swab  Result Value Ref Range Status   MRSA by PCR Next Gen NOT DETECTED NOT DETECTED Final    Comment: (NOTE) The GeneXpert MRSA Assay (FDA approved for NASAL specimens only), is one component of a comprehensive MRSA colonization surveillance program. It is not intended to diagnose MRSA infection nor to guide or monitor treatment for MRSA infections. Test performance is not FDA approved in patients less than 53 years old. Performed at [facility], [address], [place], [place] [address]    Today   Subjective    Dominic Hodges today has no new complaints -Tolerating ice chips and sips- more awake and more alert        -No emesis  Patient has been seen and examined prior to discharge   Objective   Blood pressure 120/70, pulse 74, temperature 98.6 F (37 C), resp. rate 20, height 5' 10"  (1.778 m), weight 70.7 kg, SpO2 93 %.   Intake/Output Summary (Last 24 hours) at 10/11/2021 1435 Last data filed at 10/11/2021 0612 Gross per 24 hour  Intake 1094 ml  Output 900 ml  Net 194 ml    Exam Gen:- more awake and more alert, no acute distress, elderly frail and chronically ill-appearing HEENT:- .AT, No sclera icterus Neck-Supple Neck,No JVD,.  Lungs-fair air movement, no wheezing  CV- S1, S2 normal, regular Abd-  +ve B.Sounds, Abd Soft, No tenderness, PEG tube in situ Extremity/Skin:- No  edema,   good pulses Psych-affect is appropriate, oriented x3 Neuro-generalized weakness, no new focal deficits, no tremors    Data Review   CBC w Diff:  Lab Results  Component Value Date   WBC 8.7 10/10/2021   HGB 14.5 10/10/2021    HCT 47.2 10/10/2021   PLT 206 10/10/2021   LYMPHOPCT 9 10/08/2021   BANDSPCT 2 10/05/2021   MONOPCT 4 10/08/2021   EOSPCT 0 10/08/2021   BASOPCT 1 10/08/2021    CMP:  Lab Results  Component Value Date   NA 153 (H) 10/11/2021   NA 137 09/19/2020   K 4.6 10/11/2021   CL 118 (H) 10/11/2021   CO2 26 10/11/2021   BUN 77 (H) 10/11/2021   BUN 40 (H) 09/19/2020   CREATININE 1.06 10/11/2021   CREATININE 1.12 07/21/2020   PROT 5.5 (L) 10/08/2021   ALBUMIN 2.1 (L) 10/08/2021   BILITOT 1.0 10/08/2021   ALKPHOS 58 10/08/2021   AST 23 10/08/2021   ALT 19 10/08/2021  .  Total Discharge time is about 33 minutes  Roxan Hockey M.D on 10/11/2021 at 2:35 PM  Go to [email or web address] -  for contact info  [facility] - Office  [phone]

## 2021-10-11 NOTE — Discharge Instructions (Signed)
1)Continue Osmolite 1.5 tube feeding at 30 mL an hour 2)Please give water flushes via PEG tube at 400 ml every 4 hours for the next 48 to 72 hours, please repeat BMP on Friday, 10/13/2021 and adjust PEG tube water flushes based on sodium levels 3)Aspiration precautions while on tube feeding 4)) ice chips and sips advised 5) repeat BMP blood test strongly advised on Friday, 10/13/2021

## 2021-10-11 NOTE — TOC Transition Note (Signed)
Transition of Care [facility]) - CM/SW Discharge Note   Patient Details  Name: Dominic Hodges MRN: [id number] Date of Birth: 1927/04/12  Transition of Care [facility]) CM/SW Contact:  Iona Beard, [place] Phone Number: 10/11/2021, 3:14 PM   Clinical Narrative:    CSW updated Kerri with [facility] that pt is medically ready for D/C. [facility] ready to accept pt today. CSW provided RN with numbers for room and report. TOC signing off.   Final next level of care: Long Term Nursing Home Barriers to Discharge: Barriers Resolved   Patient Goals and CMS Choice Patient states their goals for this hospitalization and ongoing recovery are:: return to [facility] CMS Medicare.gov Compare Post Acute Care list provided to:: Patient Represenative (must comment) Choice offered to / list presented to : Adult Children, Patient  Discharge Placement                       Discharge Plan and Services In-house Referral: Clinical Social Work, Hospice / Palliative Care                                   Social Determinants of Health (SDOH) Interventions     Readmission Risk Interventions    10/04/2021    3:16 PM 08/30/2021    4:00 PM  Readmission Risk Prevention Plan  Transportation Screening Complete Complete  PCP or Specialist Appt within 5-7 Days  Not Complete  Home Care Screening  Complete  Medication Review (RN CM)  Complete  HRI or Home Care Consult Complete   Social Work Consult for [place] Planning/Counseling Complete   Palliative Care Screening Complete   Medication Review Press photographer) Complete

## 2021-10-12 ENCOUNTER — Encounter: Payer: Self-pay | Admitting: Adult Health

## 2021-10-12 ENCOUNTER — Non-Acute Institutional Stay (SKILLED_NURSING_FACILITY): Payer: Medicare Other | Admitting: Adult Health

## 2021-10-12 DIAGNOSIS — Z931 Gastrostomy status: Secondary | ICD-10-CM

## 2021-10-12 DIAGNOSIS — R627 Adult failure to thrive: Secondary | ICD-10-CM | POA: Diagnosis not present

## 2021-10-12 DIAGNOSIS — J690 Pneumonitis due to inhalation of food and vomit: Secondary | ICD-10-CM | POA: Diagnosis not present

## 2021-10-12 DIAGNOSIS — F0150 Vascular dementia without behavioral disturbance: Secondary | ICD-10-CM

## 2021-10-12 DIAGNOSIS — E1122 Type 2 diabetes mellitus with diabetic chronic kidney disease: Secondary | ICD-10-CM

## 2021-10-12 DIAGNOSIS — I5022 Chronic systolic (congestive) heart failure: Secondary | ICD-10-CM

## 2021-10-12 DIAGNOSIS — N184 Chronic kidney disease, stage 4 (severe): Secondary | ICD-10-CM

## 2021-10-12 DIAGNOSIS — K5981 Ogilvie syndrome: Secondary | ICD-10-CM

## 2021-10-12 DIAGNOSIS — I480 Paroxysmal atrial fibrillation: Secondary | ICD-10-CM | POA: Diagnosis not present

## 2021-10-12 DIAGNOSIS — R1312 Dysphagia, oropharyngeal phase: Secondary | ICD-10-CM

## 2021-10-12 DIAGNOSIS — I129 Hypertensive chronic kidney disease with stage 1 through stage 4 chronic kidney disease, or unspecified chronic kidney disease: Secondary | ICD-10-CM

## 2021-10-12 DIAGNOSIS — K567 Ileus, unspecified: Secondary | ICD-10-CM | POA: Diagnosis not present

## 2021-10-12 DIAGNOSIS — R29818 Other symptoms and signs involving the nervous system: Secondary | ICD-10-CM | POA: Diagnosis not present

## 2021-10-12 DIAGNOSIS — R4189 Other symptoms and signs involving cognitive functions and awareness: Secondary | ICD-10-CM

## 2021-10-12 NOTE — Progress Notes (Signed)
Location:  [facility] Room Number: 122/D Place of Service:  SNF (31)   CODE STATUS: dnr   Allergies  Allergen Reactions   Robaxin [Methocarbamol] Other (See Comments)    Mental confusion    Chief Complaint  Patient presents with   Hospitalization Follow-up    Hospital Follow up    HPI:  He is a 88 year old man who has been hospitalized from 10-03-21 through 10-11-21. His medical history includes dysphagia requiring peg tube; atrial fibrillation. He presented to the ED with increased SOB and covid 19. He was treated for aspiration pneumonia; sepsis; hypernatremia. He is now a dnr; and will be followed by hospice care. He is a long term resident of this facility. He will continue to be followed for his chronic illnesses including: dysphagia oropharyngeal/gastrotomy tube in place:  Chronic  systolic congestive heart failure: Ileus/ogilvie syndrome/chronic constipation:  Stage 4 chronic kidney disease due to hypertension       Past Medical History:  Diagnosis Date   Acid reflux    mild-states if doesnt chew food completely he begins to cough- states has weakness in muscles due to ilieus- has been doing exreci this   Adynamic ileus ([place]) 12/2014   ? anesthesia related per pt   BPH (benign prostatic hyperplasia)    Complication of anesthesia    10/11/15- per pt "had [place]- in 11/16-related it to possibly anesthesia"   H/O seasonal allergies    Heart failure with mid-range ejection fraction (HFmEF) (HCC)    Hyperlipidemia    Hypertension    Hyponatremia    Persistent atrial fibrillation [facility])     Past Surgical History:  Procedure Laterality Date   APPENDECTOMY     approx 30 years ago/lwb   CATARACT EXTRACTION W/PHACO Left 09/11/2012   Procedure: CATARACT EXTRACTION PHACO AND INTRAOCULAR LENS PLACEMENT ([place]);  Surgeon: Tonny Branch, MD;  Location: [facility];  Service: Ophthalmology;  Laterality: Left;  CDE: 19.52    ESOPHAGOGASTRODUODENOSCOPY (EGD) WITH PROPOFOL N/A 05/03/2020   Procedure: ESOPHAGOGASTRODUODENOSCOPY (EGD) WITH PROPOFOL;  Surgeon: Aviva Signs, MD;  Location: [facility];  Service: General;  Laterality: N/A;   HIP ARTHROPLASTY Right 12/06/2014   Procedure: PARTIAL RIGHT HIP REPLACEMENT;  Surgeon: Carole Civil, MD;  Location: [facility];  Service: Orthopedics;  Laterality: Right;   HIP ARTHROPLASTY Left 03/08/2020   Procedure: ARTHROPLASTY BIPOLAR HIP (HEMIARTHROPLASTY);  Surgeon: Mordecai Rasmussen, MD;  Location: [facility];  Service: Orthopedics;  Laterality: Left;   IR REPLC GASTRO/COLONIC TUBE PERCUT W/FLUORO  09/20/2021   LUMBAR LAMINECTOMY/DECOMPRESSION MICRODISCECTOMY N/A 10/19/2015   Procedure: CENTRAL DECOMPRESSION LUMBAR LAMINECTOMY FOR SPINAL STENOSIS, L3,L4FORAMINOTOMY BILATERAL L3,L4 NERVE ROOT;  Surgeon: Latanya Maudlin, MD;  Location: [facility];  Service: Orthopedics;  Laterality: N/A;   PEG PLACEMENT N/A 05/03/2020   Procedure: PERCUTANEOUS ENDOSCOPIC GASTROSTOMY (PEG) PLACEMENT;  Surgeon: Aviva Signs, MD;  Location: [facility];  Service: General;  Laterality: N/A;   UMBILICAL HERNIA REPAIR     Approx 10 years ago/lwb   ureteral stone extraction      Social History   Socioeconomic History   Marital status: Married    Spouse name: Not on file   Number of children: Not on file   Years of education: Not on file   Highest education level: Not on file  Occupational History   Not on file  Tobacco Use   Smoking status: Former    Types: Pipe    Quit date: 01/09/1959  Years since quitting: 62.8   Smokeless tobacco: Never   Tobacco comments:    smoked a pipe not sure how long  Vaping Use   Vaping Use: Never used  Substance and Sexual Activity   Alcohol use: Yes    Comment: occasional social drink/lwb   Drug use: No   Sexual activity: Not on file  Other Topics Concern   Not on file  Social History Narrative   Not on file   Social Determinants of Health   Financial Resource Strain:  Not on file  Food Insecurity: No Food Insecurity (10/03/2021)   Hunger Vital Sign    Worried About Running Out of Food in the Last Year: Never true    Ran Out of Food in the Last Year: Never true  Transportation Needs: No Transportation Needs (10/03/2021)   PRAPARE - Hydrologist (Medical): No    Lack of Transportation (Non-Medical): No  Physical Activity: Not on file  Stress: Not on file  Social Connections: Not on file  Intimate Partner Violence: Unknown (10/03/2021)   Humiliation, Afraid, Rape, and Kick questionnaire    Fear of Current or Ex-Partner: Patient refused    Emotionally Abused: Patient refused    Physically Abused: Patient refused    Sexually Abused: Patient refused   Family History  Problem Relation Age of Onset   Coronary artery disease Other        no family history of premature CAD      VITAL SIGNS BP 129/70   Pulse 62   Temp 97.8 F (36.6 C)   Resp 20   Ht 6' 0.5" (1.842 m)   Wt 164 lb 9.6 oz (74.7 kg)   SpO2 95%   BMI 22.02 kg/m   Outpatient Encounter Medications as of 10/12/2021  Medication Sig   acetaminophen (TYLENOL) 325 MG tablet Take 650 mg by mouth every 6 (six) hours as needed.   albuterol (PROVENTIL) (2.5 MG/3ML) 0.083% nebulizer solution Take 3 mLs (2.5 mg total) by nebulization every 6 (six) hours as needed for wheezing or shortness of breath.   amoxicillin-clavulanate (AUGMENTIN) 600-42.9 MG/5ML suspension Place 4.2 mLs (500 mg total) into feeding tube every 12 (twelve) hours for 5 days.   apixaban (ELIQUIS) 5 MG TABS tablet Place 1 tablet (5 mg total) into feeding tube 2 (two) times daily.   fluorometholone (EFLONE) 0.1 % ophthalmic suspension Place 1 drop into both eyes 2 (two) times daily.   fluorometholone (FML) 0.1 % ophthalmic suspension Place 1 drop into both eyes 2 (two) times daily.   insulin detemir (LEVEMIR) 100 UNIT/ML injection Inject 0.07 mLs (7 Units total) into the skin at bedtime.   Nutritional  Supplements (FEEDING SUPPLEMENT, OSMOLITE 1.5 CAL,) LIQD Place 30 mLs into feeding tube continuous. Every shift; Day, Evening, and Night.   Protein (FEEDING SUPPLEMENT, PROSOURCE TF20,) liquid Place 60 mLs into feeding tube 2 (two) times daily.   scopolamine (TRANSDERM-SCOP) 1 MG/3DAYS Place 1 patch (1.5 mg total) onto the skin every 3 (three) days.   Water For Irrigation, Sterile (FREE WATER) SOLN Place 400 mLs into feeding tube every 4 (four) hours.   No facility-administered encounter medications on file as of 10/12/2021.     SIGNIFICANT DIAGNOSTIC EXAMS  PREVIOUS   08-26-21: chest x-ray:  Cardiomegaly with mild, diffuse bilateral interstitial pulmonary opacity, likely edema. No focal airspace opacity.  08-27-21: 2-d echo:  1. Left ventricular ejection fraction, by estimation, is 45 to 50%. The  left ventricle has  mildly decreased function. The left ventricle  demonstrates global hypokinesis. There is moderate asymmetric left  ventricular hypertrophy of the basal-septal  segment. Left ventricular diastolic parameters are indeterminate.   2. Right ventricular systolic function is mildly reduced. The right  ventricular size is mildly enlarged.   3. Left atrial size was severely dilated.   4. The mitral valve is normal in structure. Trivial mitral valve  regurgitation. No evidence of mitral stenosis.   5. The aortic valve is tricuspid. Aortic valve regurgitation is trivial.  Aortic valve sclerosis/calcification is present, without any evidence of  aortic stenosis.   6. Aortic dilatation noted. There is mild dilatation of the ascending  aorta, measuring 39 mm.   08-27-21: kub:  There is a moderate to marked gaseous distention of right colon measuring up to 10.9 cm in diameter. This may suggest ileus or partial colonic obstruction. There is no significant small bowel dilation. Increased markings in left lower lung field suggest scarring or atelectasis or pneumonia.  08-29-21: chest x-ray:   Patchy infiltrates are seen in both lower lung fields, more so on the left side suggesting atelectasis/pneumonia. Part of this finding may suggest underlying scarring. No significant interval changes are noted.  08-31-21: ct of abdomen and pelvis:  1. 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 2. Extensive diverticulosis with mild focal fat stranding of the sigmoid colon, concerning for early acute diverticulitis. 3. 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 Recommend follow-up MRCP in 2 years. 4. New age indeterminate compression deformity of T12. Correlate for point tenderness. 5.  Aortic Atherosclerosis   09-02-21: KUB: continued air filled dilated sigmoid colon without change; no other changes  09-07-21: KUB; no significant change in the air filled sigmoid colon moderate stool burden again seen throughout colon  09-12-21: kub: air filled sigmoid colon no stool burden; left lower lobe infiltrate  TODAY  10-03-21: chest x-ray: Similar heterogeneous airspace opacities in the left greater than right lung base suggestive of pneumonia  10-03-21: ct of chest:  1. New areas of dense basilar consolidation in the lower lobes, endobronchial material and patchy areas of ground-glass and discrete nodularity discussed above, compatible with aspiration related pneumonia. 2. 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 3. Mild cardiomegaly, aortic atherosclerosis and coronary artery disease. Aortic Atherosclerosis     LABS REVIEWED PREVIOUS   08-26-21: wbc 8,2; hgb 13.2; hct 38.0; mcv 90.5 plt 217; glucose 228; bun 36; creat 0.95; k+ 4.5; na++ 121; ca 7.9; gfr>60; protein 6.7; albumin 3.7: BNP 873.0 08-27-21: wbc 8.9; hgb 13.3; hct 38.4; mcv 90.8 plt 203; glucose 96; bun 33; creat 0.94; k+ 3.7;  na++ 128; ca 8.2; gfr >60; protein 6.2; albumin 3.4; mag 2.2; tsh 6.083 free t4: 1.30 08-30-21: glucose 78; bun 38; creat 1.29; k+ 3.6; na++ 138; ca 8.8; gfr 52 09-02-21: glucose 150; bun 43; creat 1.36; k+ 3.5; na++ 137; ca 8.6; gfr 49; mag 2.2  09-11-21: glucose 228; bun 51; creat 1.15; k+ 3.6; na++ 140; ca 8.5; gfr 60; protein 6.6; albumin 3.3; mag 2.6   TODAY  10-03-21 ;wbc 8.9; hgb 14.4; hct 44.6; mcv 996.1 plt 195; glucose 337; bun 100; creat 2.29; k+ 4.3; na++ 148; ca 8.3 gfr 26; protein 7.0 albumin 2.6 blood and culture: no growth 10-06-21; wbc 6.1; hgb 13.8; hct 41.7; mcv 93.1 plt 187;  glucose 332; bun 134; creat 2.20; k+ 2.8; na++ 152; ca 8.0; gfr 27; protein 5.7; albumin 2.1 mag 2.8 10-10-21: wbc 8.7; hgb 14.5; hct 47.2; mcv 99.2 plt 206; glucose 90; bun 83; creat 1.10 ;k+ 4.3; na++ 158; ca 8.4 gfr >60   Review of Systems  Reason unable to perform ROS: unable to fully participate.   Physical Exam Constitutional:      General: He is not in acute distress.    Appearance: He is well-developed. He is not diaphoretic.  Neck:     Thyroid: No thyromegaly.  Cardiovascular:     Rate and Rhythm: Normal rate and regular rhythm.     Pulses: Normal pulses.     Heart sounds: Normal heart sounds.  Pulmonary:     Effort: Pulmonary effort is normal. No respiratory distress.     Breath sounds: Rhonchi present.  Abdominal:     General: Bowel sounds are normal. There is no distension.     Palpations: Abdomen is soft.     Tenderness: There is no abdominal tenderness.     Comments: Peg tube present   Musculoskeletal:        General: Normal range of motion.     Cervical back: Neck supple.     Right lower leg: No edema.     Left lower leg: No edema.     Comments: Has mild generalized edema present   Lymphadenopathy:     Cervical: No cervical adenopathy.  Skin:    General: Skin is warm and dry.     Comments: Bruising present   Neurological:     Mental Status: He is alert. Mental status is at  baseline.  Psychiatric:        Mood and Affect: Mood normal.       ASSESSMENT/ PLAN:  TODAY:   Aspiration pneumonia; will complete agumentin 500 mg twice daily for 5 days.   2. Dysphagia oropharyngeal/gastrotomy tube in place: will raise osmolite 1.5 at 35 cc per hour will continue water flushes is on scopolamine patch  every 3 days to help with excessive oral secretions.   3. Chronic  systolic congestive heart failure: EF 40-455 (08-27-21) Is presently off torsemide  4. Ileus/ogilvie syndrome/chronic constipation: tube feeding will be changed to 35 cc per hour  5. Stage 4 chronic kidney disease due to hypertension: bun 83; creat 1.10; gfr >60  6. Atrial fibrillation: heart rate is stable is on eliquis 5 mg twice daily   7. 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   8. Hypernatremia: is 158 will continue water flushes   9. Failure to thrive in adult; weight is 164 pounds protein 5.7 albumin 2.1   10. Protein calorie malnutrition severe: albumin 5.7 albumin 2.1 is on prostat 3 times daily  11. 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

## 2021-10-13 DIAGNOSIS — N1832 Chronic kidney disease, stage 3b: Secondary | ICD-10-CM | POA: Diagnosis not present

## 2021-10-13 DIAGNOSIS — I639 Cerebral infarction, unspecified: Secondary | ICD-10-CM | POA: Diagnosis not present

## 2021-10-13 DIAGNOSIS — R1310 Dysphagia, unspecified: Secondary | ICD-10-CM | POA: Diagnosis not present

## 2021-10-13 DIAGNOSIS — E785 Hyperlipidemia, unspecified: Secondary | ICD-10-CM | POA: Diagnosis not present

## 2021-10-13 DIAGNOSIS — D519 Vitamin B12 deficiency anemia, unspecified: Secondary | ICD-10-CM | POA: Diagnosis not present

## 2021-10-13 DIAGNOSIS — I10 Essential (primary) hypertension: Secondary | ICD-10-CM | POA: Diagnosis not present

## 2021-10-13 DIAGNOSIS — N401 Enlarged prostate with lower urinary tract symptoms: Secondary | ICD-10-CM | POA: Diagnosis not present

## 2021-10-13 DIAGNOSIS — K219 Gastro-esophageal reflux disease without esophagitis: Secondary | ICD-10-CM | POA: Diagnosis not present

## 2021-10-13 DIAGNOSIS — I5021 Acute systolic (congestive) heart failure: Secondary | ICD-10-CM | POA: Diagnosis not present

## 2021-10-13 DIAGNOSIS — F0150 Vascular dementia without behavioral disturbance: Secondary | ICD-10-CM | POA: Diagnosis not present

## 2021-10-13 DIAGNOSIS — F419 Anxiety disorder, unspecified: Secondary | ICD-10-CM | POA: Diagnosis not present

## 2021-10-13 DIAGNOSIS — I4891 Unspecified atrial fibrillation: Secondary | ICD-10-CM | POA: Diagnosis not present

## 2021-10-13 DIAGNOSIS — Z8616 Personal history of COVID-19: Secondary | ICD-10-CM | POA: Diagnosis not present

## 2021-10-14 DIAGNOSIS — E785 Hyperlipidemia, unspecified: Secondary | ICD-10-CM | POA: Diagnosis not present

## 2021-10-14 DIAGNOSIS — I5021 Acute systolic (congestive) heart failure: Secondary | ICD-10-CM | POA: Diagnosis not present

## 2021-10-14 DIAGNOSIS — K219 Gastro-esophageal reflux disease without esophagitis: Secondary | ICD-10-CM | POA: Diagnosis not present

## 2021-10-14 DIAGNOSIS — N401 Enlarged prostate with lower urinary tract symptoms: Secondary | ICD-10-CM | POA: Diagnosis not present

## 2021-10-14 DIAGNOSIS — I10 Essential (primary) hypertension: Secondary | ICD-10-CM | POA: Diagnosis not present

## 2021-10-14 DIAGNOSIS — I4891 Unspecified atrial fibrillation: Secondary | ICD-10-CM | POA: Diagnosis not present

## 2021-10-15 DIAGNOSIS — K219 Gastro-esophageal reflux disease without esophagitis: Secondary | ICD-10-CM | POA: Diagnosis not present

## 2021-10-15 DIAGNOSIS — N401 Enlarged prostate with lower urinary tract symptoms: Secondary | ICD-10-CM | POA: Diagnosis not present

## 2021-10-15 DIAGNOSIS — I5021 Acute systolic (congestive) heart failure: Secondary | ICD-10-CM | POA: Diagnosis not present

## 2021-10-15 DIAGNOSIS — E785 Hyperlipidemia, unspecified: Secondary | ICD-10-CM | POA: Diagnosis not present

## 2021-10-15 DIAGNOSIS — I4891 Unspecified atrial fibrillation: Secondary | ICD-10-CM | POA: Diagnosis not present

## 2021-10-15 DIAGNOSIS — I10 Essential (primary) hypertension: Secondary | ICD-10-CM | POA: Diagnosis not present

## 2021-10-16 ENCOUNTER — Non-Acute Institutional Stay (SKILLED_NURSING_FACILITY): Payer: Medicare Other | Admitting: Adult Health

## 2021-10-16 DIAGNOSIS — R627 Adult failure to thrive: Secondary | ICD-10-CM | POA: Diagnosis not present

## 2021-10-16 DIAGNOSIS — J9611 Chronic respiratory failure with hypoxia: Secondary | ICD-10-CM

## 2021-10-16 DIAGNOSIS — I5021 Acute systolic (congestive) heart failure: Secondary | ICD-10-CM | POA: Diagnosis not present

## 2021-10-16 DIAGNOSIS — I4891 Unspecified atrial fibrillation: Secondary | ICD-10-CM | POA: Diagnosis not present

## 2021-10-16 DIAGNOSIS — I5022 Chronic systolic (congestive) heart failure: Secondary | ICD-10-CM | POA: Diagnosis not present

## 2021-10-16 DIAGNOSIS — E785 Hyperlipidemia, unspecified: Secondary | ICD-10-CM | POA: Diagnosis not present

## 2021-10-16 DIAGNOSIS — J9690 Respiratory failure, unspecified, unspecified whether with hypoxia or hypercapnia: Secondary | ICD-10-CM | POA: Insufficient documentation

## 2021-10-16 DIAGNOSIS — I10 Essential (primary) hypertension: Secondary | ICD-10-CM | POA: Diagnosis not present

## 2021-10-16 DIAGNOSIS — K219 Gastro-esophageal reflux disease without esophagitis: Secondary | ICD-10-CM | POA: Diagnosis not present

## 2021-10-16 DIAGNOSIS — N401 Enlarged prostate with lower urinary tract symptoms: Secondary | ICD-10-CM | POA: Diagnosis not present

## 2021-10-16 DIAGNOSIS — E1122 Type 2 diabetes mellitus with diabetic chronic kidney disease: Secondary | ICD-10-CM | POA: Insufficient documentation

## 2021-10-16 MED ORDER — MORPHINE SULFATE (CONCENTRATE) 20 MG/ML PO SOLN: 5.0000 mg | ORAL | 0 refills | Status: AC | PRN

## 2021-10-16 NOTE — Progress Notes (Signed)
Location:  [facility] Room Number: 61 Place of Service:  SNF (31)   CODE STATUS: dnr   Allergies  Allergen Reactions   Robaxin [Methocarbamol] Other (See Comments)    Mental confusion    Chief Complaint  Patient presents with   Acute Visit    Patient comfort     HPI:  He is followed by hospice care. He is restless with rapid respirations without obvious signs of distress present. The hospice nurse is concerned that he is not being kept comfortable. His family is ok with the use of morphine.   Past Medical History:  Diagnosis Date   Acid reflux    mild-states if doesnt chew food completely he begins to cough- states has weakness in muscles due to ilieus- has been doing exreci this   Adynamic ileus ([place]) 12/2014   ? anesthesia related per pt   BPH (benign prostatic hyperplasia)    Complication of anesthesia    10/11/15- per pt "had [place]- in 11/16-related it to possibly anesthesia"   H/O seasonal allergies    Heart failure with mid-range ejection fraction (HFmEF) (HCC)    Hyperlipidemia    Hypertension    Hyponatremia    Persistent atrial fibrillation [facility])     Past Surgical History:  Procedure Laterality Date   APPENDECTOMY     approx 30 years ago/lwb   CATARACT EXTRACTION W/PHACO Left 09/11/2012   Procedure: CATARACT EXTRACTION PHACO AND INTRAOCULAR LENS PLACEMENT ([place]);  Surgeon: Tonny Branch, MD;  Location: [facility] ORS;  Service: Ophthalmology;  Laterality: Left;  CDE: 19.52   ESOPHAGOGASTRODUODENOSCOPY (EGD) WITH PROPOFOL N/A 05/03/2020   Procedure: ESOPHAGOGASTRODUODENOSCOPY (EGD) WITH PROPOFOL;  Surgeon: Aviva Signs, MD;  Location: [facility] ORS;  Service: General;  Laterality: N/A;   HIP ARTHROPLASTY Right 12/06/2014   Procedure: PARTIAL RIGHT HIP REPLACEMENT;  Surgeon: Carole Civil, MD;  Location: [facility] ORS;  Service: Orthopedics;  Laterality: Right;   HIP ARTHROPLASTY Left 03/08/2020   Procedure: ARTHROPLASTY  BIPOLAR HIP (HEMIARTHROPLASTY);  Surgeon: Mordecai Rasmussen, MD;  Location: [facility] ORS;  Service: Orthopedics;  Laterality: Left;   IR REPLC GASTRO/COLONIC TUBE PERCUT W/FLUORO  09/20/2021   LUMBAR LAMINECTOMY/DECOMPRESSION MICRODISCECTOMY N/A 10/19/2015   Procedure: CENTRAL DECOMPRESSION LUMBAR LAMINECTOMY FOR SPINAL STENOSIS, L3,L4FORAMINOTOMY BILATERAL L3,L4 NERVE ROOT;  Surgeon: Latanya Maudlin, MD;  Location: [facility];  Service: Orthopedics;  Laterality: N/A;   PEG PLACEMENT N/A 05/03/2020   Procedure: PERCUTANEOUS ENDOSCOPIC GASTROSTOMY (PEG) PLACEMENT;  Surgeon: Aviva Signs, MD;  Location: [facility] ORS;  Service: General;  Laterality: N/A;   UMBILICAL HERNIA REPAIR     Approx 10 years ago/lwb   ureteral stone extraction      Social History   Socioeconomic History   Marital status: Married    Spouse name: Not on file   Number of children: Not on file   Years of education: Not on file   Highest education level: Not on file  Occupational History   Not on file  Tobacco Use   Smoking status: Former    Types: Pipe    Quit date: 01/09/1959    Years since quitting: 62.8   Smokeless tobacco: Never   Tobacco comments:    smoked a pipe not sure how long  Vaping Use   Vaping Use: Never used  Substance and Sexual Activity   Alcohol use: Yes    Comment: occasional social drink/lwb   Drug use: No   Sexual activity: Not on file  Other Topics Concern   Not on file  Social History Narrative   Not on file   Social Determinants of Health   Financial Resource Strain: Not on file  Food Insecurity: No Food Insecurity (10/03/2021)   Hunger Vital Sign    Worried About Running Out of Food in the Last Year: Never true    Ran Out of Food in the Last Year: Never true  Transportation Needs: No Transportation Needs (10/03/2021)   PRAPARE - Hydrologist (Medical): No    Lack of Transportation (Non-Medical): No  Physical Activity: Not on file  Stress: Not on file  Social  Connections: Not on file  Intimate Partner Violence: Unknown (10/03/2021)   Humiliation, Afraid, Rape, and Kick questionnaire    Fear of Current or Ex-Partner: Patient refused    Emotionally Abused: Patient refused    Physically Abused: Patient refused    Sexually Abused: Patient refused   Family History  Problem Relation Age of Onset   Coronary artery disease Other        no family history of premature CAD      VITAL SIGNS BP 111/60   Pulse 71   Temp 98.2 F (36.8 C)   Ht 6' 0.5" (1.842 m)   Wt 164 lb (74.4 kg)   BMI 21.94 kg/m   Outpatient Encounter Medications as of 10/16/2021  Medication Sig   acetaminophen (TYLENOL) 325 MG tablet Take 650 mg by mouth every 6 (six) hours as needed.   albuterol (PROVENTIL) (2.5 MG/3ML) 0.083% nebulizer solution Take 3 mLs (2.5 mg total) by nebulization every 6 (six) hours as needed for wheezing or shortness of breath.   amoxicillin-clavulanate (AUGMENTIN) 600-42.9 MG/5ML suspension Place 4.2 mLs (500 mg total) into feeding tube every 12 (twelve) hours for 5 days.   apixaban (ELIQUIS) 5 MG TABS tablet Place 1 tablet (5 mg total) into feeding tube 2 (two) times daily.   fluorometholone (EFLONE) 0.1 % ophthalmic suspension Place 1 drop into both eyes 2 (two) times daily.   fluorometholone (FML) 0.1 % ophthalmic suspension Place 1 drop into both eyes 2 (two) times daily.   insulin detemir (LEVEMIR) 100 UNIT/ML injection Inject 0.07 mLs (7 Units total) into the skin at bedtime.   Nutritional Supplements (FEEDING SUPPLEMENT, OSMOLITE 1.5 CAL,) LIQD Place 30 mLs into feeding tube continuous. Every shift; Day, Evening, and Night.   Protein (FEEDING SUPPLEMENT, PROSOURCE TF20,) liquid Place 60 mLs into feeding tube 2 (two) times daily.   scopolamine (TRANSDERM-SCOP) 1 MG/3DAYS Place 1 patch (1.5 mg total) onto the skin every 3 (three) days.   Water For Irrigation, Sterile (FREE WATER) SOLN Place 400 mLs into feeding tube every 4 (four) hours.   zinc  sulfate 220 (50 Zn) MG capsule Place 1 capsule (220 mg total) into feeding tube daily.   No facility-administered encounter medications on file as of 10/16/2021.     SIGNIFICANT DIAGNOSTIC EXAMS  PREVIOUS   08-26-21: chest x-ray:  Cardiomegaly with mild, diffuse bilateral interstitial pulmonary opacity, likely edema. No focal airspace opacity.  08-27-21: 2-d echo:  1. Left ventricular ejection fraction, by estimation, is 45 to 50%. The  left ventricle has mildly decreased function. The left ventricle  demonstrates global hypokinesis. There is moderate asymmetric left  ventricular hypertrophy of the basal-septal  segment. Left ventricular diastolic parameters are indeterminate.   2. Right ventricular systolic function is mildly reduced. The right  ventricular size is mildly enlarged.   3. Left atrial size  was severely dilated.   4. The mitral valve is normal in structure. Trivial mitral valve  regurgitation. No evidence of mitral stenosis.   5. The aortic valve is tricuspid. Aortic valve regurgitation is trivial.  Aortic valve sclerosis/calcification is present, without any evidence of  aortic stenosis.   6. Aortic dilatation noted. There is mild dilatation of the ascending  aorta, measuring 39 mm.   08-27-21: kub:  There is a moderate to marked gaseous distention of right colon measuring up to 10.9 cm in diameter. This may suggest ileus or partial colonic obstruction. There is no significant small bowel dilation. Increased markings in left lower lung field suggest scarring or atelectasis or pneumonia.  08-29-21: chest x-ray:  Patchy infiltrates are seen in both lower lung fields, more so on the left side suggesting atelectasis/pneumonia. Part of this finding may suggest underlying scarring. No significant interval changes are noted.  08-31-21: ct of abdomen and pelvis:  1. 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 2. Extensive diverticulosis with mild focal fat stranding of the sigmoid colon, concerning for early acute diverticulitis. 3. 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 Recommend follow-up MRCP in 2 years. 4. New age indeterminate compression deformity of T12. Correlate for point tenderness. 5.  Aortic Atherosclerosis   09-02-21: KUB: continued air filled dilated sigmoid colon without change; no other changes  09-07-21: KUB; no significant change in the air filled sigmoid colon moderate stool burden again seen throughout colon  09-12-21: kub: air filled sigmoid colon no stool burden; left lower lobe infiltrate  10-03-21: chest x-ray: Similar heterogeneous airspace opacities in the left greater than right lung base suggestive of pneumonia  10-03-21: ct of chest:  1. New areas of dense basilar consolidation in the lower lobes, endobronchial material and patchy areas of ground-glass and discrete nodularity discussed above, compatible with aspiration related pneumonia. 2. 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 3. Mild cardiomegaly, aortic atherosclerosis and coronary artery disease. Aortic Atherosclerosis    NO NEW EXAMS   LABS REVIEWED PREVIOUS   08-26-21: wbc 8,2; hgb 13.2; hct 38.0; mcv 90.5 plt 217; glucose 228; bun 36; creat 0.95; k+ 4.5; na++ 121; ca 7.9; gfr>60; protein 6.7; albumin 3.7: BNP 873.0 08-27-21: wbc 8.9; hgb 13.3; hct 38.4; mcv 90.8 plt 203; glucose 96; bun 33; creat 0.94; k+ 3.7; na++ 128; ca 8.2; gfr >60; protein 6.2; albumin 3.4; mag 2.2; tsh 6.083 free t4: 1.30 08-30-21: glucose 78; bun 38; creat 1.29; k+ 3.6; na++ 138; ca 8.8; gfr 52 09-02-21: glucose 150; bun 43; creat 1.36; k+ 3.5; na++ 137; ca 8.6; gfr 49; mag 2.2  09-11-21: glucose 228; bun 51; creat 1.15; k+ 3.6; na++ 140; ca 8.5; gfr 60; protein 6.6; albumin 3.3; mag 2.6   10-03-21 ;wbc 8.9; hgb 14.4; hct 44.6; mcv 996.1 plt 195; glucose 337; bun 100; creat 2.29; k+ 4.3; na++ 148; ca 8.3 gfr 26; protein 7.0 albumin 2.6 blood and culture: no growth 10-06-21; wbc 6.1; hgb 13.8; hct 41.7; mcv 93.1 plt 187; glucose 332; bun 134; creat 2.20; k+ 2.8; na++ 152; ca 8.0; gfr 27; protein 5.7; albumin 2.1 mag 2.8 10-10-21: wbc 8.7; hgb 14.5; hct 47.2; mcv 99.2 plt 206; glucose 90; bun 83; creat 1.10 ;k+ 4.3; na++ 158; ca 8.4 gfr >60   NO NEW LABS.   Review of  Systems  Reason unable to perform ROS: unable to fully participate.   Physical Exam Constitutional:      General: He is not in acute distress.    Appearance: He is well-developed. He is not diaphoretic.  Neck:     Thyroid: No thyromegaly.  Cardiovascular:     Rate and Rhythm: Normal rate and regular rhythm.     Pulses: Normal pulses.     Heart sounds: Normal heart sounds.  Pulmonary:     Effort: Pulmonary effort is normal. No respiratory distress.     Breath sounds: Rhonchi present.  Abdominal:     General: Bowel sounds are normal. There is no distension.     Palpations: Abdomen is soft.     Tenderness: There is no abdominal tenderness.     Comments: Peg tube present   Genitourinary:    Comments: foley Musculoskeletal:     Cervical back: Neck supple.     Right lower leg: No edema.     Left lower leg: No edema.     Comments: Generalized edema  Lymphadenopathy:     Cervical: No cervical adenopathy.  Skin:    General: Skin is warm and dry.  Neurological:     Mental Status: He is alert. Mental status is at baseline.  Psychiatric:        Mood and Affect: Mood normal.        ASSESSMENT/ PLAN:  TODAY  Failure to thrive in adult Chronic respiratory failure with hypoxia Chronic systolic congestive heart failure  Will begin roxanol 5 mg every 4 hours as needed for distress will monitor his status.    Ok Edwards NP [facility]   call [phone]

## 2021-10-17 ENCOUNTER — Other Ambulatory Visit (HOSPITAL_COMMUNITY)
Admission: RE | Admit: 2021-10-17 | Discharge: 2021-10-17 | Disposition: A | Discharge status: Home / Self Care | Payer: Medicare Other | Source: Skilled Nursing Facility | Attending: Adult Health | Admitting: Adult Health

## 2021-10-17 ENCOUNTER — Non-Acute Institutional Stay (SKILLED_NURSING_FACILITY): Payer: Medicare Other | Admitting: Adult Health

## 2021-10-17 ENCOUNTER — Encounter: Payer: Self-pay | Admitting: Adult Health

## 2021-10-17 DIAGNOSIS — R1312 Dysphagia, oropharyngeal phase: Secondary | ICD-10-CM | POA: Diagnosis not present

## 2021-10-17 DIAGNOSIS — I10 Essential (primary) hypertension: Secondary | ICD-10-CM | POA: Diagnosis not present

## 2021-10-17 DIAGNOSIS — R627 Adult failure to thrive: Secondary | ICD-10-CM | POA: Diagnosis not present

## 2021-10-17 DIAGNOSIS — N401 Enlarged prostate with lower urinary tract symptoms: Secondary | ICD-10-CM | POA: Diagnosis not present

## 2021-10-17 DIAGNOSIS — I129 Hypertensive chronic kidney disease with stage 1 through stage 4 chronic kidney disease, or unspecified chronic kidney disease: Secondary | ICD-10-CM | POA: Insufficient documentation

## 2021-10-17 DIAGNOSIS — I5021 Acute systolic (congestive) heart failure: Secondary | ICD-10-CM | POA: Diagnosis not present

## 2021-10-17 DIAGNOSIS — F0150 Vascular dementia without behavioral disturbance: Secondary | ICD-10-CM | POA: Diagnosis not present

## 2021-10-17 DIAGNOSIS — E785 Hyperlipidemia, unspecified: Secondary | ICD-10-CM | POA: Diagnosis not present

## 2021-10-17 DIAGNOSIS — I4891 Unspecified atrial fibrillation: Secondary | ICD-10-CM | POA: Diagnosis not present

## 2021-10-17 DIAGNOSIS — K219 Gastro-esophageal reflux disease without esophagitis: Secondary | ICD-10-CM | POA: Diagnosis not present

## 2021-10-17 LAB — BASIC METABOLIC PANEL
Anion gap: 8 (ref 5–15)
BUN: 36 mg/dL — ABNORMAL HIGH (ref 8–23)
CO2: 28 mmol/L (ref 22–32)
Calcium: 7.5 mg/dL — ABNORMAL LOW (ref 8.9–10.3)
Chloride: 103 mmol/L (ref 98–111)
Creatinine, Ser: 0.87 mg/dL (ref 0.61–1.24)
GFR, Estimated: >60 mL/min (ref >60)
Glucose, Bld: 178 mg/dL — ABNORMAL HIGH (ref 70–99)
Potassium: 4 mmol/L (ref 3.5–5.1)
Sodium: 139 mmol/L (ref 135–145)

## 2021-10-17 LAB — MAGNESIUM: Magnesium: 2.2 mg/dL (ref 1.7–2.4)

## 2021-10-17 NOTE — Progress Notes (Signed)
Location:  [facility] Room Number: 122-D Place of Service:  SNF (31)   CODE STATUS: DNR  Allergies  Allergen Reactions   Robaxin [Methocarbamol] Other (See Comments)    Mental confusion    Chief Complaint  Patient presents with   Acute Visit    Status change    HPI:  He continues to decline. He is followed by hospice care. He is having increased shortness of breath; is more restless did require roxanol 5 mg one time. His family has decided that it is time to stop his tube feeding and all other medications. There are no reports of known aspiration present.   Past Medical History:  Diagnosis Date   Acid reflux    mild-states if doesnt chew food completely he begins to cough- states has weakness in muscles due to ilieus- has been doing exreci this   Adynamic ileus ([place]) 12/2014   ? anesthesia related per pt   BPH (benign prostatic hyperplasia)    Complication of anesthesia    10/11/15- per pt "had [place]- in 11/16-related it to possibly anesthesia"   H/O seasonal allergies    Heart failure with mid-range ejection fraction (HFmEF) (HCC)    Hyperlipidemia    Hypertension    Hyponatremia    Persistent atrial fibrillation [facility])     Past Surgical History:  Procedure Laterality Date   APPENDECTOMY     approx 30 years ago/lwb   CATARACT EXTRACTION W/PHACO Left 09/11/2012   Procedure: CATARACT EXTRACTION PHACO AND INTRAOCULAR LENS PLACEMENT ([place]);  Surgeon: Tonny Branch, MD;  Location: [facility] ORS;  Service: Ophthalmology;  Laterality: Left;  CDE: 19.52   ESOPHAGOGASTRODUODENOSCOPY (EGD) WITH PROPOFOL N/A 05/03/2020   Procedure: ESOPHAGOGASTRODUODENOSCOPY (EGD) WITH PROPOFOL;  Surgeon: Aviva Signs, MD;  Location: [facility] ORS;  Service: General;  Laterality: N/A;   HIP ARTHROPLASTY Right 12/06/2014   Procedure: PARTIAL RIGHT HIP REPLACEMENT;  Surgeon: Carole Civil, MD;  Location: [facility] ORS;  Service: Orthopedics;  Laterality: Right;    HIP ARTHROPLASTY Left 03/08/2020   Procedure: ARTHROPLASTY BIPOLAR HIP (HEMIARTHROPLASTY);  Surgeon: Mordecai Rasmussen, MD;  Location: [facility] ORS;  Service: Orthopedics;  Laterality: Left;   IR REPLC GASTRO/COLONIC TUBE PERCUT W/FLUORO  09/20/2021   LUMBAR LAMINECTOMY/DECOMPRESSION MICRODISCECTOMY N/A 10/19/2015   Procedure: CENTRAL DECOMPRESSION LUMBAR LAMINECTOMY FOR SPINAL STENOSIS, L3,L4FORAMINOTOMY BILATERAL L3,L4 NERVE ROOT;  Surgeon: Latanya Maudlin, MD;  Location: [facility];  Service: Orthopedics;  Laterality: N/A;   PEG PLACEMENT N/A 05/03/2020   Procedure: PERCUTANEOUS ENDOSCOPIC GASTROSTOMY (PEG) PLACEMENT;  Surgeon: Aviva Signs, MD;  Location: [facility] ORS;  Service: General;  Laterality: N/A;   UMBILICAL HERNIA REPAIR     Approx 10 years ago/lwb   ureteral stone extraction      Social History   Socioeconomic History   Marital status: Married    Spouse name: Not on file   Number of children: Not on file   Years of education: Not on file   Highest education level: Not on file  Occupational History   Not on file  Tobacco Use   Smoking status: Former    Types: Pipe    Quit date: 01/09/1959    Years since quitting: 62.8   Smokeless tobacco: Never   Tobacco comments:    smoked a pipe not sure how long  Vaping Use   Vaping Use: Never used  Substance and Sexual Activity   Alcohol use: Yes    Comment: occasional social drink/lwb   Drug  use: No   Sexual activity: Not on file  Other Topics Concern   Not on file  Social History Narrative   Not on file   Social Determinants of Health   Financial Resource Strain: Not on file  Food Insecurity: No Food Insecurity (10/03/2021)   Hunger Vital Sign    Worried About Running Out of Food in the Last Year: Never true    Ran Out of Food in the Last Year: Never true  Transportation Needs: No Transportation Needs (10/03/2021)   PRAPARE - Hydrologist (Medical): No    Lack of Transportation (Non-Medical): No  Physical  Activity: Not on file  Stress: Not on file  Social Connections: Not on file  Intimate Partner Violence: Unknown (10/03/2021)   Humiliation, Afraid, Rape, and Kick questionnaire    Fear of Current or Ex-Partner: Patient refused    Emotionally Abused: Patient refused    Physically Abused: Patient refused    Sexually Abused: Patient refused   Family History  Problem Relation Age of Onset   Coronary artery disease Other        no family history of premature CAD      VITAL SIGNS BP (!) 108/54   Pulse 76   Temp 97.9 F (36.6 C)   Resp 20   Wt 164 lb (74.4 kg)   SpO2 94%   BMI 21.94 kg/m   Outpatient Encounter Medications as of 10/17/2021  Medication Sig   acetaminophen (TYLENOL) 325 MG tablet Take 650 mg by mouth every 6 (six) hours as needed.   albuterol (PROVENTIL) (2.5 MG/3ML) 0.083% nebulizer solution Take 3 mLs (2.5 mg total) by nebulization every 6 (six) hours as needed for wheezing or shortness of breath.   morphine (ROXANOL) 20 MG/ML concentrated solution Take 0.25 mLs (5 mg total) by mouth every 4 (four) hours as needed for severe pain.   Nutritional Supplements (FEEDING SUPPLEMENT, OSMOLITE 1.5 CAL,) LIQD Place 30 mLs into feeding tube continuous. Every shift; Day, Evening, and Night.   scopolamine (TRANSDERM-SCOP) 1 MG/3DAYS Place 1 patch (1.5 mg total) onto the skin every 3 (three) days.   Water For Irrigation, Sterile (FREE WATER) SOLN Place 400 mLs into feeding tube every 4 (four) hours.   [DISCONTINUED] apixaban (ELIQUIS) 5 MG TABS tablet Place 1 tablet (5 mg total) into feeding tube 2 (two) times daily.   [DISCONTINUED] fluorometholone (EFLONE) 0.1 % ophthalmic suspension Place 1 drop into both eyes 2 (two) times daily.   [DISCONTINUED] fluorometholone (FML) 0.1 % ophthalmic suspension Place 1 drop into both eyes 2 (two) times daily.   [DISCONTINUED] insulin detemir (LEVEMIR) 100 UNIT/ML injection Inject 0.07 mLs (7 Units total) into the skin at bedtime.    [DISCONTINUED] Protein (FEEDING SUPPLEMENT, PROSOURCE TF20,) liquid Place 60 mLs into feeding tube 2 (two) times daily.   [DISCONTINUED] zinc sulfate 220 (50 Zn) MG capsule Place 1 capsule (220 mg total) into feeding tube daily.   No facility-administered encounter medications on file as of 10/17/2021.     SIGNIFICANT DIAGNOSTIC EXAMS   PREVIOUS   08-26-21: chest x-ray:  Cardiomegaly with mild, diffuse bilateral interstitial pulmonary opacity, likely edema. No focal airspace opacity.  08-27-21: 2-d echo:  1. Left ventricular ejection fraction, by estimation, is 45 to 50%. The  left ventricle has mildly decreased function. The left ventricle  demonstrates global hypokinesis. There is moderate asymmetric left  ventricular hypertrophy of the basal-septal  segment. Left ventricular diastolic parameters are indeterminate.   2.  Right ventricular systolic function is mildly reduced. The right  ventricular size is mildly enlarged.   3. Left atrial size was severely dilated.   4. The mitral valve is normal in structure. Trivial mitral valve  regurgitation. No evidence of mitral stenosis.   5. The aortic valve is tricuspid. Aortic valve regurgitation is trivial.  Aortic valve sclerosis/calcification is present, without any evidence of  aortic stenosis.   6. Aortic dilatation noted. There is mild dilatation of the ascending  aorta, measuring 39 mm.   08-27-21: kub:  There is a moderate to marked gaseous distention of right colon measuring up to 10.9 cm in diameter. This may suggest ileus or partial colonic obstruction. There is no significant small bowel dilation. Increased markings in left lower lung field suggest scarring or atelectasis or pneumonia.  08-29-21: chest x-ray:  Patchy infiltrates are seen in both lower lung fields, more so on the left side suggesting atelectasis/pneumonia. Part of this finding may suggest underlying scarring. No significant interval changes are noted.  08-31-21: ct of  abdomen and pelvis:  1. 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 2. Extensive diverticulosis with mild focal fat stranding of the sigmoid colon, concerning for early acute diverticulitis. 3. 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 Recommend follow-up MRCP in 2 years. 4. New age indeterminate compression deformity of T12. Correlate for point tenderness. 5.  Aortic Atherosclerosis   09-02-21: KUB: continued air filled dilated sigmoid colon without change; no other changes  09-07-21: KUB; no significant change in the air filled sigmoid colon moderate stool burden again seen throughout colon  09-12-21: kub: air filled sigmoid colon no stool burden; left lower lobe infiltrate  10-03-21: chest x-ray: Similar heterogeneous airspace opacities in the left greater than right lung base suggestive of pneumonia  10-03-21: ct of chest:  1. New areas of dense basilar consolidation in the lower lobes, endobronchial material and patchy areas of ground-glass and discrete nodularity discussed above, compatible with aspiration related pneumonia. 2. 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 3. Mild cardiomegaly, aortic atherosclerosis and coronary artery disease. Aortic Atherosclerosis    NO NEW EXAMS   LABS REVIEWED PREVIOUS   08-26-21: wbc 8,2; hgb 13.2; hct 38.0; mcv 90.5 plt 217; glucose 228; bun 36; creat 0.95; k+ 4.5; na++ 121; ca 7.9; gfr>60; protein 6.7; albumin 3.7: BNP 873.0 08-27-21: wbc 8.9; hgb 13.3; hct 38.4; mcv 90.8 plt 203; glucose 96; bun 33; creat 0.94; k+ 3.7; na++ 128; ca 8.2; gfr >60; protein 6.2; albumin 3.4; mag 2.2; tsh 6.083 free t4: 1.30 08-30-21: glucose 78; bun 38; creat 1.29; k+ 3.6; na++ 138; ca 8.8; gfr 52 09-02-21: glucose 150; bun 43; creat 1.36; k+ 3.5; na++ 137; ca  8.6; gfr 49; mag 2.2  09-11-21: glucose 228; bun 51; creat 1.15; k+ 3.6; na++ 140; ca 8.5; gfr 60; protein 6.6; albumin 3.3; mag 2.6  10-03-21 ;wbc 8.9; hgb 14.4; hct 44.6; mcv 996.1 plt 195; glucose 337; bun 100; creat 2.29; k+ 4.3; na++ 148; ca 8.3 gfr 26; protein 7.0 albumin 2.6 blood and culture: no growth 10-06-21; wbc 6.1; hgb 13.8; hct 41.7; mcv 93.1 plt 187; glucose 332; bun 134; creat 2.20; k+ 2.8; na++ 152; ca 8.0; gfr 27; protein 5.7; albumin 2.1 mag 2.8 10-10-21: wbc 8.7; hgb 14.5; hct 47.2; mcv 99.2 plt 206; glucose 90;  bun 83; creat 1.10 ;k+ 4.3; na++ 158; ca 8.4 gfr >60   NO NEW LABS.   Review of Systems  Reason unable to perform ROS: unable to participate.   Physical Exam Constitutional:      General: He is not in acute distress.    Appearance: He is well-developed. He is not diaphoretic.  Neck:     Thyroid: No thyromegaly.  Cardiovascular:     Rate and Rhythm: Normal rate. Rhythm irregular.     Pulses: Normal pulses.     Heart sounds: Normal heart sounds.  Pulmonary:     Effort: Pulmonary effort is normal. No respiratory distress.     Breath sounds: Rhonchi present.  Abdominal:     General: Bowel sounds are normal. There is no distension.     Palpations: Abdomen is soft.     Tenderness: There is no abdominal tenderness.     Comments: Peg tube present   Genitourinary:    Comments: foley Musculoskeletal:     Cervical back: Neck supple.     Comments: Generalized edema present   Lymphadenopathy:     Cervical: No cervical adenopathy.  Skin:    General: Skin is warm and dry.  Neurological:     Mental Status: He is alert. Mental status is at baseline.  Psychiatric:        Mood and Affect: Mood normal.       ASSESSMENT/ PLAN:  TODAY  Failure to thrive in adult Oropharyngeal dysphagia Vascular dementia without behavioral disturbance   Will stop tube feeding with water flushes; levemir; eliquis; eye drops; vital signs and weights     Ok Edwards  NP [facility]   call [phone]

## 2021-10-18 DIAGNOSIS — K219 Gastro-esophageal reflux disease without esophagitis: Secondary | ICD-10-CM | POA: Diagnosis not present

## 2021-10-18 DIAGNOSIS — I5021 Acute systolic (congestive) heart failure: Secondary | ICD-10-CM | POA: Diagnosis not present

## 2021-10-18 DIAGNOSIS — I4891 Unspecified atrial fibrillation: Secondary | ICD-10-CM | POA: Diagnosis not present

## 2021-10-18 DIAGNOSIS — N401 Enlarged prostate with lower urinary tract symptoms: Secondary | ICD-10-CM | POA: Diagnosis not present

## 2021-10-18 DIAGNOSIS — E785 Hyperlipidemia, unspecified: Secondary | ICD-10-CM | POA: Diagnosis not present

## 2021-10-18 DIAGNOSIS — I10 Essential (primary) hypertension: Secondary | ICD-10-CM | POA: Diagnosis not present

## 2021-10-19 DIAGNOSIS — E785 Hyperlipidemia, unspecified: Secondary | ICD-10-CM | POA: Diagnosis not present

## 2021-10-19 DIAGNOSIS — I10 Essential (primary) hypertension: Secondary | ICD-10-CM | POA: Diagnosis not present

## 2021-10-19 DIAGNOSIS — I4891 Unspecified atrial fibrillation: Secondary | ICD-10-CM | POA: Diagnosis not present

## 2021-10-19 DIAGNOSIS — N401 Enlarged prostate with lower urinary tract symptoms: Secondary | ICD-10-CM | POA: Diagnosis not present

## 2021-10-19 DIAGNOSIS — K219 Gastro-esophageal reflux disease without esophagitis: Secondary | ICD-10-CM | POA: Diagnosis not present

## 2021-10-19 DIAGNOSIS — I5021 Acute systolic (congestive) heart failure: Secondary | ICD-10-CM | POA: Diagnosis not present

## 2021-10-20 DIAGNOSIS — K219 Gastro-esophageal reflux disease without esophagitis: Secondary | ICD-10-CM | POA: Diagnosis not present

## 2021-10-20 DIAGNOSIS — N401 Enlarged prostate with lower urinary tract symptoms: Secondary | ICD-10-CM | POA: Diagnosis not present

## 2021-10-20 DIAGNOSIS — I10 Essential (primary) hypertension: Secondary | ICD-10-CM | POA: Diagnosis not present

## 2021-10-20 DIAGNOSIS — E785 Hyperlipidemia, unspecified: Secondary | ICD-10-CM | POA: Diagnosis not present

## 2021-10-20 DIAGNOSIS — I4891 Unspecified atrial fibrillation: Secondary | ICD-10-CM | POA: Diagnosis not present

## 2021-10-20 DIAGNOSIS — I5021 Acute systolic (congestive) heart failure: Secondary | ICD-10-CM | POA: Diagnosis not present

## 2021-10-21 ENCOUNTER — Telehealth: Payer: Self-pay | Admitting: Adult Health

## 2021-10-21 DIAGNOSIS — E785 Hyperlipidemia, unspecified: Secondary | ICD-10-CM | POA: Diagnosis not present

## 2021-10-21 DIAGNOSIS — K219 Gastro-esophageal reflux disease without esophagitis: Secondary | ICD-10-CM | POA: Diagnosis not present

## 2021-10-21 DIAGNOSIS — I10 Essential (primary) hypertension: Secondary | ICD-10-CM | POA: Diagnosis not present

## 2021-10-21 DIAGNOSIS — R451 Restlessness and agitation: Secondary | ICD-10-CM

## 2021-10-21 DIAGNOSIS — I4891 Unspecified atrial fibrillation: Secondary | ICD-10-CM | POA: Diagnosis not present

## 2021-10-21 DIAGNOSIS — I5021 Acute systolic (congestive) heart failure: Secondary | ICD-10-CM | POA: Diagnosis not present

## 2021-10-21 DIAGNOSIS — N401 Enlarged prostate with lower urinary tract symptoms: Secondary | ICD-10-CM | POA: Diagnosis not present

## 2021-10-21 MED ORDER — LORAZEPAM 2 MG/ML PO CONC: 0.5000 mg | ORAL | 0 refills | Status: AC | PRN

## 2021-10-21 NOTE — Telephone Encounter (Signed)
Nurse called from [facility] center to report this resident is experiencing restlessness and appears uncomfortable. He is NPO and had a feeding tube which was removed and is now end of life care. Order given.

## 2021-10-22 DIAGNOSIS — N401 Enlarged prostate with lower urinary tract symptoms: Secondary | ICD-10-CM | POA: Diagnosis not present

## 2021-10-22 DIAGNOSIS — I10 Essential (primary) hypertension: Secondary | ICD-10-CM | POA: Diagnosis not present

## 2021-10-22 DIAGNOSIS — K219 Gastro-esophageal reflux disease without esophagitis: Secondary | ICD-10-CM | POA: Diagnosis not present

## 2021-10-22 DIAGNOSIS — I5021 Acute systolic (congestive) heart failure: Secondary | ICD-10-CM | POA: Diagnosis not present

## 2021-10-22 DIAGNOSIS — I4891 Unspecified atrial fibrillation: Secondary | ICD-10-CM | POA: Diagnosis not present

## 2021-10-22 DIAGNOSIS — E785 Hyperlipidemia, unspecified: Secondary | ICD-10-CM | POA: Diagnosis not present

## 2021-10-23 ENCOUNTER — Ambulatory Visit: Payer: Medicare Other | Admitting: Medical

## 2021-10-23 DIAGNOSIS — K219 Gastro-esophageal reflux disease without esophagitis: Secondary | ICD-10-CM | POA: Diagnosis not present

## 2021-10-23 DIAGNOSIS — I5021 Acute systolic (congestive) heart failure: Secondary | ICD-10-CM | POA: Diagnosis not present

## 2021-10-23 DIAGNOSIS — N401 Enlarged prostate with lower urinary tract symptoms: Secondary | ICD-10-CM | POA: Diagnosis not present

## 2021-10-23 DIAGNOSIS — I10 Essential (primary) hypertension: Secondary | ICD-10-CM | POA: Diagnosis not present

## 2021-10-23 DIAGNOSIS — E785 Hyperlipidemia, unspecified: Secondary | ICD-10-CM | POA: Diagnosis not present

## 2021-10-23 DIAGNOSIS — I4891 Unspecified atrial fibrillation: Secondary | ICD-10-CM | POA: Diagnosis not present

## 2021-10-24 DIAGNOSIS — I4891 Unspecified atrial fibrillation: Secondary | ICD-10-CM | POA: Diagnosis not present

## 2021-10-24 DIAGNOSIS — I5021 Acute systolic (congestive) heart failure: Secondary | ICD-10-CM | POA: Diagnosis not present

## 2021-10-24 DIAGNOSIS — I10 Essential (primary) hypertension: Secondary | ICD-10-CM | POA: Diagnosis not present

## 2021-10-24 DIAGNOSIS — K219 Gastro-esophageal reflux disease without esophagitis: Secondary | ICD-10-CM | POA: Diagnosis not present

## 2021-10-24 DIAGNOSIS — N401 Enlarged prostate with lower urinary tract symptoms: Secondary | ICD-10-CM | POA: Diagnosis not present

## 2021-10-24 DIAGNOSIS — E785 Hyperlipidemia, unspecified: Secondary | ICD-10-CM | POA: Diagnosis not present

## 2021-10-25 DIAGNOSIS — I5021 Acute systolic (congestive) heart failure: Secondary | ICD-10-CM | POA: Diagnosis not present

## 2021-10-25 DIAGNOSIS — I10 Essential (primary) hypertension: Secondary | ICD-10-CM | POA: Diagnosis not present

## 2021-10-25 DIAGNOSIS — K219 Gastro-esophageal reflux disease without esophagitis: Secondary | ICD-10-CM | POA: Diagnosis not present

## 2021-10-25 DIAGNOSIS — N401 Enlarged prostate with lower urinary tract symptoms: Secondary | ICD-10-CM | POA: Diagnosis not present

## 2021-10-25 DIAGNOSIS — E785 Hyperlipidemia, unspecified: Secondary | ICD-10-CM | POA: Diagnosis not present

## 2021-10-25 DIAGNOSIS — I4891 Unspecified atrial fibrillation: Secondary | ICD-10-CM | POA: Diagnosis not present

## 2021-11-08 DEATH — deceased

## 2022-12-01 IMAGING — DX DG CHEST 2V
2 series · 2 of 2 positions shown · non-contrast
Comparison: September 19, 2020.

CLINICAL DATA: Cough, shortness of breath.

EXAM:
CHEST - 2 VIEW

[chest lat]
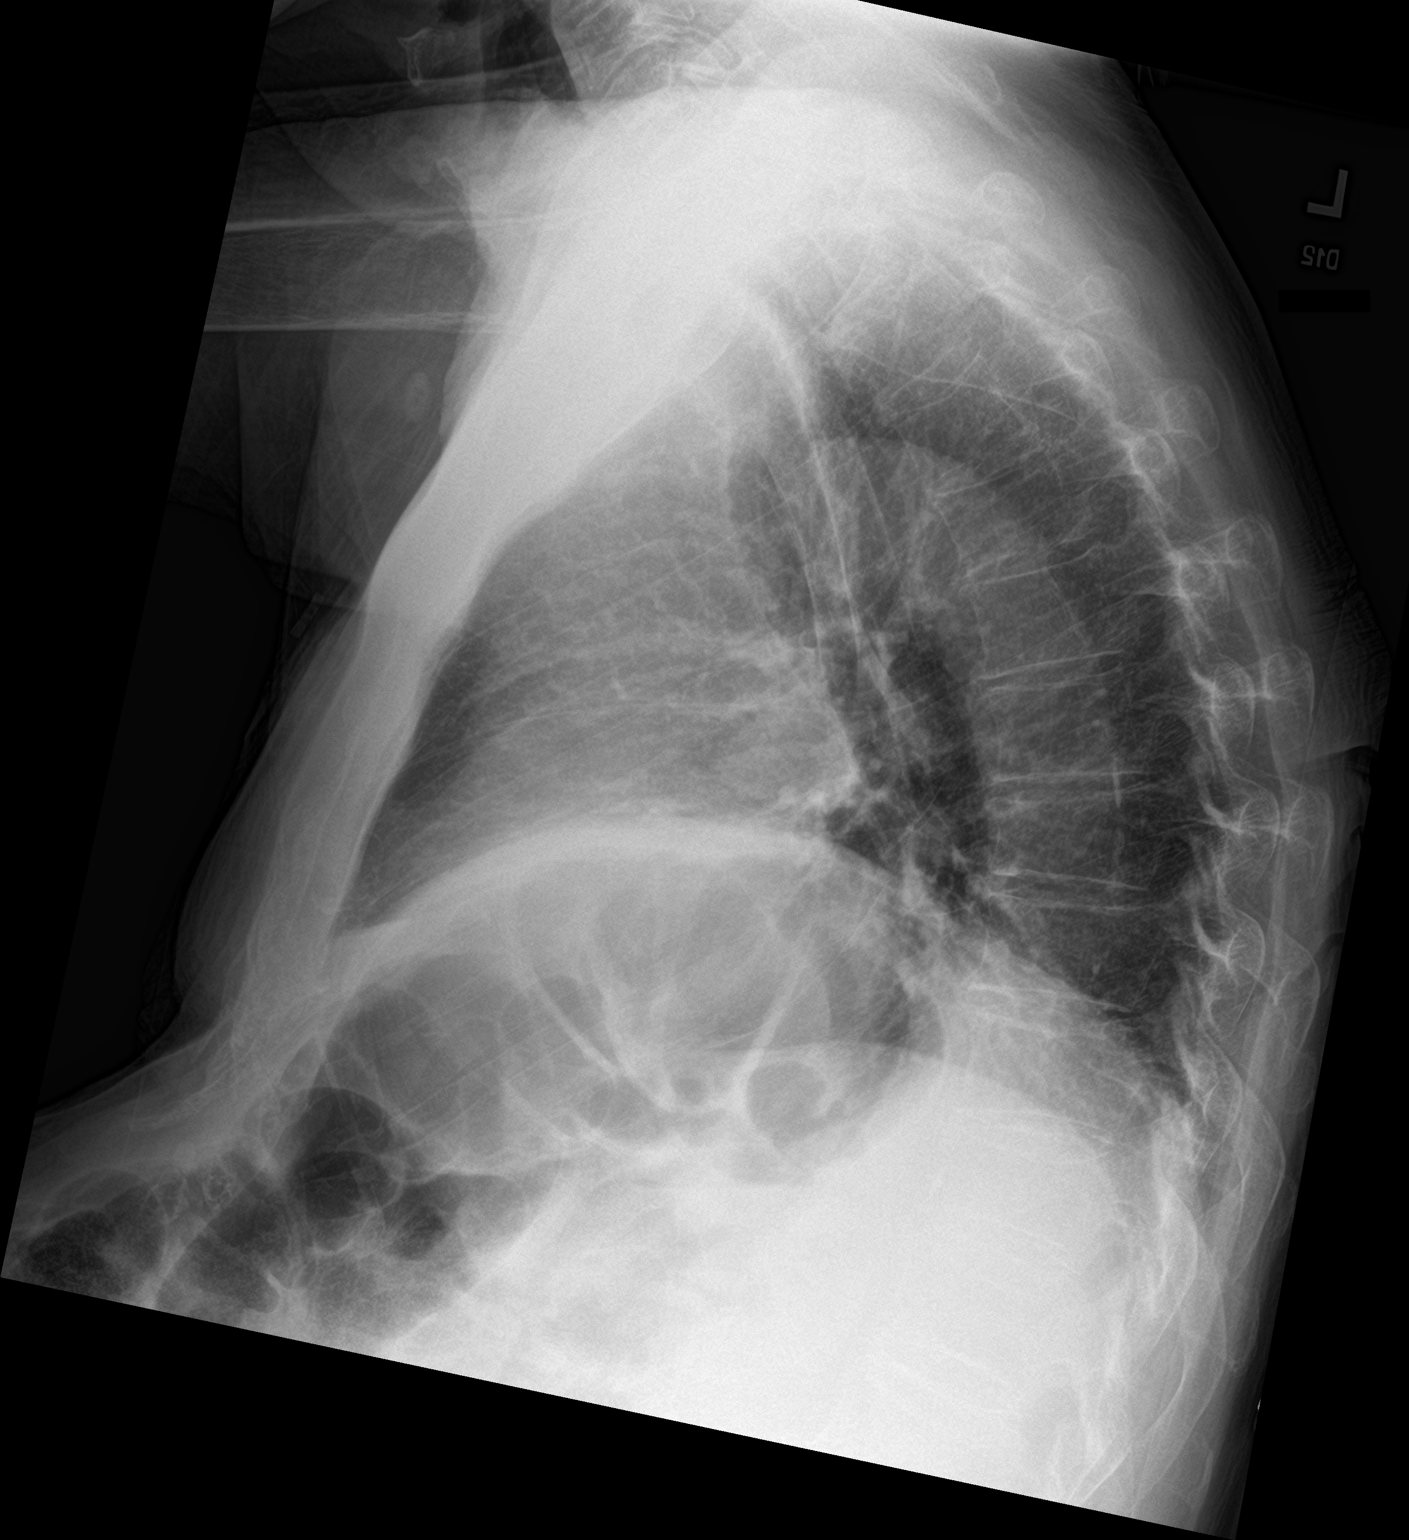

[chest ap]
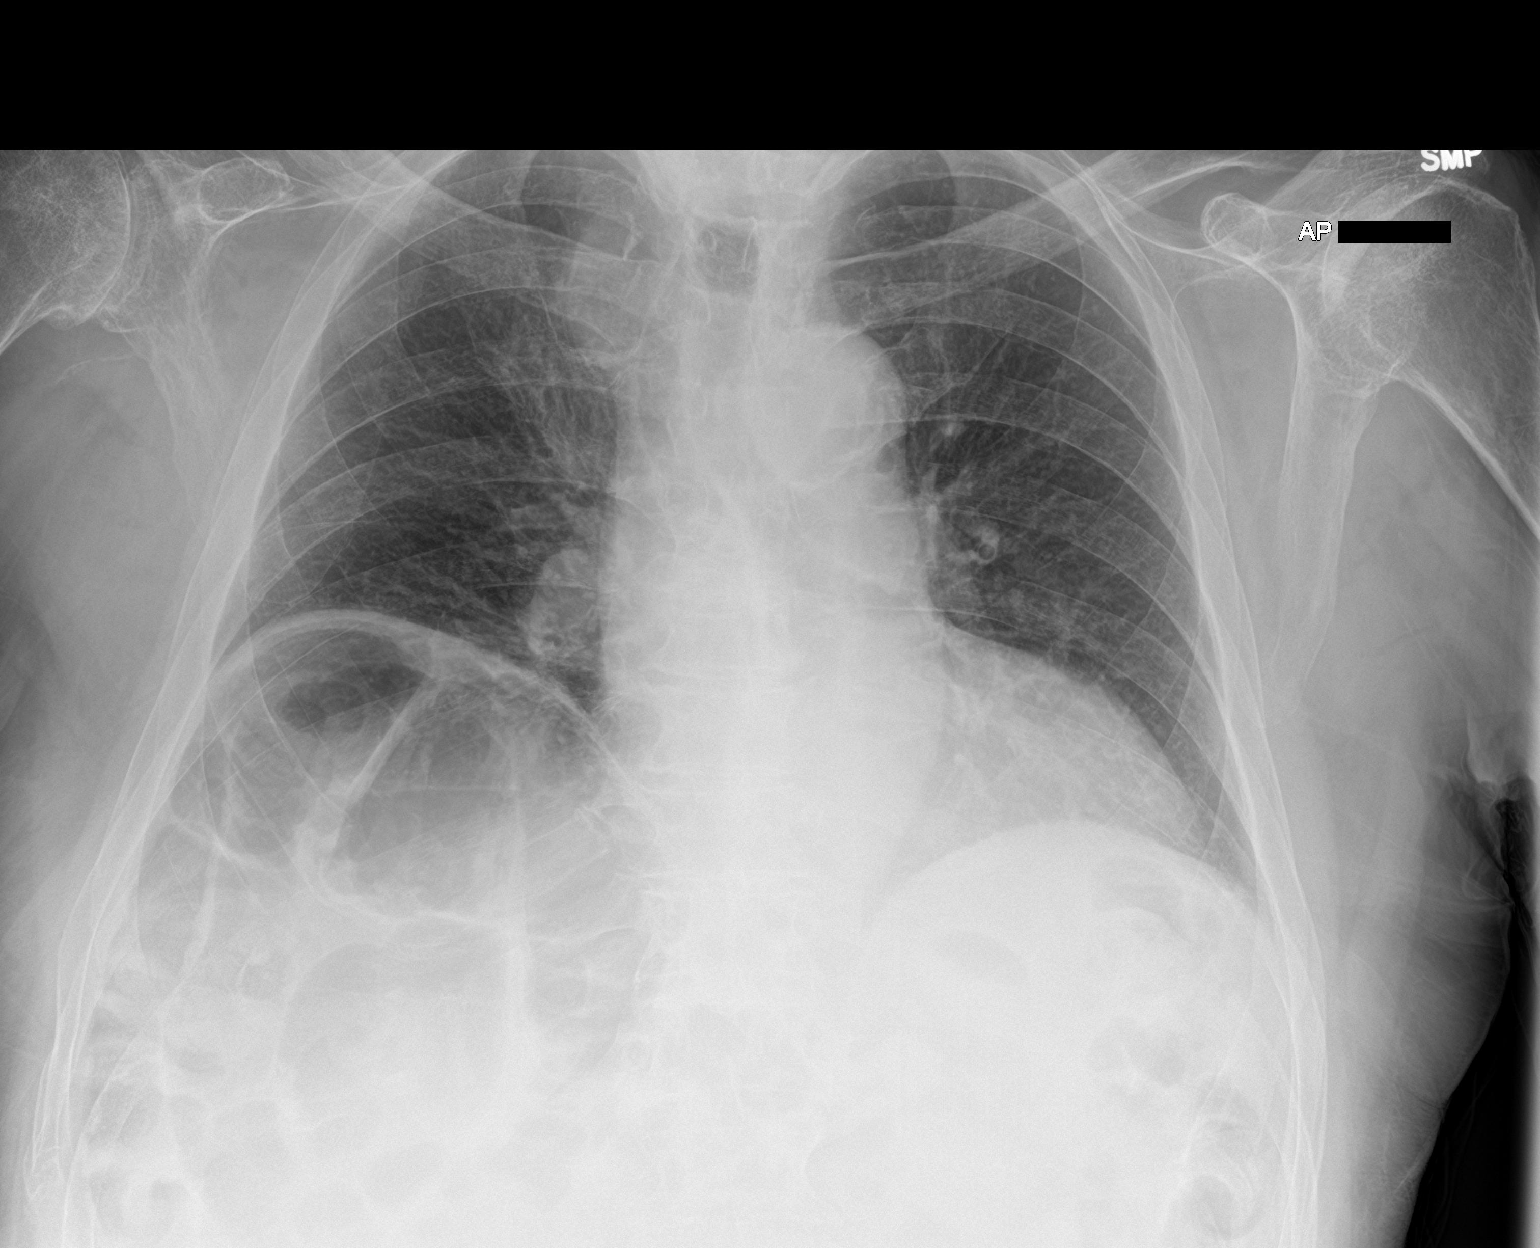

[2 of 2 positions shown; findings below may reference images not displayed]

FINDINGS: The heart size and mediastinal contours are within normal limits.
Both lungs are clear. Stable elevated right hemidiaphragm. The
visualized skeletal structures are unremarkable.
IMPRESSION: No active cardiopulmonary disease.
